# Patient Record
Sex: Male | Born: 1955 | Race: White | Hispanic: No | Smoking: Never smoker
Health system: Southern US, Community
[De-identification: ages and names within clinical notes are randomized; demographics above are authoritative.]

## PROBLEM LIST (undated history)

## (undated) DIAGNOSIS — M542 Cervicalgia: Secondary | ICD-10-CM

## (undated) DIAGNOSIS — H109 Unspecified conjunctivitis: Principal | ICD-10-CM

## (undated) DIAGNOSIS — M1990 Unspecified osteoarthritis, unspecified site: Secondary | ICD-10-CM

## (undated) DIAGNOSIS — J309 Allergic rhinitis, unspecified: Secondary | ICD-10-CM

## (undated) DIAGNOSIS — G8929 Other chronic pain: Secondary | ICD-10-CM

## (undated) DIAGNOSIS — C801 Malignant (primary) neoplasm, unspecified: Secondary | ICD-10-CM

## (undated) DIAGNOSIS — IMO0002 Reserved for concepts with insufficient information to code with codable children: Secondary | ICD-10-CM

## (undated) DIAGNOSIS — IMO0001 Reserved for inherently not codable concepts without codable children: Secondary | ICD-10-CM

## (undated) DIAGNOSIS — Z87442 Personal history of urinary calculi: Secondary | ICD-10-CM

## (undated) DIAGNOSIS — I10 Essential (primary) hypertension: Secondary | ICD-10-CM

## (undated) DIAGNOSIS — G43009 Migraine without aura, not intractable, without status migrainosus: Secondary | ICD-10-CM

## (undated) DIAGNOSIS — J029 Acute pharyngitis, unspecified: Secondary | ICD-10-CM

## (undated) DIAGNOSIS — M549 Dorsalgia, unspecified: Secondary | ICD-10-CM

## (undated) DIAGNOSIS — G629 Polyneuropathy, unspecified: Secondary | ICD-10-CM

## (undated) DIAGNOSIS — R5382 Chronic fatigue, unspecified: Secondary | ICD-10-CM

## (undated) DIAGNOSIS — G4730 Sleep apnea, unspecified: Secondary | ICD-10-CM

## (undated) DIAGNOSIS — K219 Gastro-esophageal reflux disease without esophagitis: Secondary | ICD-10-CM

## (undated) HISTORY — DX: Allergic rhinitis, unspecified: J30.9

## (undated) HISTORY — PX: COLONOSCOPY: SHX174

## (undated) HISTORY — DX: Chronic fatigue, unspecified: R53.82

## (undated) HISTORY — DX: Migraine without aura, not intractable, without status migrainosus: G43.009

## (undated) HISTORY — DX: Acute pharyngitis, unspecified: J02.9

## (undated) HISTORY — PX: BACK SURGERY: SHX140

## (undated) HISTORY — PX: LYMPH NODE BIOPSY: SHX201

## (undated) HISTORY — DX: Unspecified conjunctivitis: H10.9

## (undated) HISTORY — PX: TONSILLECTOMY: SUR1361

## (undated) HISTORY — DX: Personal history of urinary calculi: Z87.442

## (undated) HISTORY — DX: Essential (primary) hypertension: I10

---

## 1975-12-02 HISTORY — PX: OTHER SURGICAL HISTORY: SHX169

## 2000-08-21 ENCOUNTER — Encounter: Payer: Self-pay | Admitting: Emergency Medicine

## 2000-08-21 ENCOUNTER — Encounter: Admission: RE | Admit: 2000-08-21 | Discharge: 2000-08-21 | Payer: Self-pay | Admitting: Emergency Medicine

## 2002-09-06 ENCOUNTER — Encounter: Admission: RE | Admit: 2002-09-06 | Discharge: 2002-09-06 | Payer: Self-pay | Admitting: Specialist

## 2002-09-06 ENCOUNTER — Encounter: Payer: Self-pay | Admitting: Specialist

## 2002-09-30 ENCOUNTER — Encounter: Payer: Self-pay | Admitting: Specialist

## 2002-10-05 ENCOUNTER — Inpatient Hospital Stay (HOSPITAL_COMMUNITY): Admission: RE | Admit: 2002-10-05 | Discharge: 2002-10-09 | Payer: Self-pay | Admitting: Specialist

## 2002-10-05 ENCOUNTER — Encounter: Payer: Self-pay | Admitting: Specialist

## 2002-12-01 HISTORY — PX: NECK SURGERY: SHX720

## 2002-12-14 ENCOUNTER — Encounter: Payer: Self-pay | Admitting: Specialist

## 2002-12-14 ENCOUNTER — Encounter: Admission: RE | Admit: 2002-12-14 | Discharge: 2002-12-14 | Payer: Self-pay | Admitting: Specialist

## 2003-04-26 ENCOUNTER — Encounter: Payer: Self-pay | Admitting: Specialist

## 2003-04-26 ENCOUNTER — Encounter: Admission: RE | Admit: 2003-04-26 | Discharge: 2003-04-26 | Payer: Self-pay | Admitting: Specialist

## 2003-10-12 ENCOUNTER — Encounter: Admission: RE | Admit: 2003-10-12 | Discharge: 2003-10-12 | Payer: Self-pay | Admitting: Specialist

## 2003-12-04 ENCOUNTER — Inpatient Hospital Stay (HOSPITAL_COMMUNITY): Admission: RE | Admit: 2003-12-04 | Discharge: 2003-12-08 | Payer: Self-pay | Admitting: Specialist

## 2004-06-12 ENCOUNTER — Encounter: Admission: RE | Admit: 2004-06-12 | Discharge: 2004-06-12 | Payer: Self-pay | Admitting: Specialist

## 2005-10-17 ENCOUNTER — Emergency Department (HOSPITAL_COMMUNITY): Admission: EM | Admit: 2005-10-17 | Discharge: 2005-10-17 | Payer: Self-pay | Admitting: Emergency Medicine

## 2005-11-13 ENCOUNTER — Ambulatory Visit (HOSPITAL_COMMUNITY)
Admission: RE | Admit: 2005-11-13 | Discharge: 2005-11-13 | Payer: Self-pay | Admitting: Physical Medicine and Rehabilitation

## 2006-03-02 ENCOUNTER — Ambulatory Visit: Payer: Self-pay | Admitting: Anesthesiology

## 2006-03-02 ENCOUNTER — Encounter: Admission: RE | Admit: 2006-03-02 | Discharge: 2006-05-31 | Payer: Self-pay | Admitting: Anesthesiology

## 2006-05-13 ENCOUNTER — Encounter: Admission: RE | Admit: 2006-05-13 | Discharge: 2006-05-13 | Payer: Self-pay | Admitting: Specialist

## 2006-07-07 ENCOUNTER — Observation Stay (HOSPITAL_COMMUNITY): Admission: RE | Admit: 2006-07-07 | Discharge: 2006-07-08 | Payer: Self-pay | Admitting: Specialist

## 2006-10-27 ENCOUNTER — Encounter: Admission: RE | Admit: 2006-10-27 | Discharge: 2006-10-27 | Payer: Self-pay | Admitting: Specialist

## 2006-11-17 ENCOUNTER — Encounter: Admission: RE | Admit: 2006-11-17 | Discharge: 2006-11-17 | Payer: Self-pay | Admitting: Specialist

## 2007-08-11 ENCOUNTER — Emergency Department (HOSPITAL_COMMUNITY): Admission: EM | Admit: 2007-08-11 | Discharge: 2007-08-11 | Payer: Self-pay | Admitting: Emergency Medicine

## 2010-12-22 ENCOUNTER — Encounter: Payer: Self-pay | Admitting: Emergency Medicine
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
## 2011-04-18 NOTE — Procedures (Signed)
NAMETAYO, MAUTE NO.:  [id number]   MEDICAL RECORD NO.:  [id number]          PATIENT TYPE:  REC   LOCATION:  [facility]                          FACILITY:  [facility]   PHYSICIAN:  Celene Kras, MD        DATE OF BIRTH:  Nov 27, 1956   DATE OF PROCEDURE:  03/24/2006  DATE OF DISCHARGE:                                 OPERATIVE REPORT   Shane Cantu comes in for pain management today.  Evaluated him __________  history form 14 point review of systems demonstrated a positive provocative  experience to the cervical facet medial branch injection. We will go ahead  and proceed on with RF.  I have reviewed the risks, complications and  options including bleeding, infection, nerve damage, stroke, seizure, death  and other unforeseen problems not commonly occurring, and idiosyncratic  reactions to medications.  We planned left side, C4, 5, 6 and 7 with  contributory enervation addressed, under local anesthetic with a 5 mm active  tip.  I have reviewed this procedure extensively with him using models,  descriptive indices, no barrier to communication.   1.  Will probably have to reinforce at a later date, but will follow him      expectantly and will have him return to Dr. Ethelene Hal in about a month.      Realistically, I relayed to him that this is about a 25 to 50%      diminution in pain perception but more importantly improved range of      motion, less myofascial pain, less reliance on medication.  He      understands this is not a panacea, but a movement forward to enhance      wellness and recovery.   Objectively, diffuse paracervical and myofascial discomfort, positive  cervical facetal compression test, left greater than right, suboccipital  compression test positive.  He has no new neurological findings nor sensory  or reflex.   IMPRESSION:  Cervical facet syndromesof the cervical spine.   PLAN:  Cervical facet medial branch radiofrequency neuroablation to the left  side,  C4, 5, 6, and 7 contributory enervation addressed under local  anesthetic.  Independent needle access points.  He has consented.   The patient was taken to the fluoroscopy suite and placed in supine position  after prepped and draped in usual fashion.  Using 22 gauge RF needle, I  advanced under direct fluoroscopic observation facet, medial branch at C4,  5, 6 and 7 and confirmed placement of multiple fluoroscopic positions.  No  CSF, heme or paresthesia.  Test block uneventfully.  I followed with motor  sensory stim, and inject 0.5 mL lidocaine 1% NPF each level, total of 30 mg  __________ in divided dose.   Lesion performed 60 degrees 60 seconds.   The patient tolerated the procedure well.  No complication for procedure.  Appropriate recovery.  Discharge instructions given.  I will assess him in  context of activities of daily living.  Return to Dr. Ethelene Hal.           ______________________________  Celene Kras, MD     HH/MEDQ  D:  03/24/2006 11:24:55  T:  03/25/2006 08:05:29  Job:  [id number]   cc:   Caralyn Guile. Ethelene Hal, M.D.  Fax: [phone]

## 2011-04-18 NOTE — Discharge Summary (Signed)
NAMEELVAN, EBRON NO.:  [id number]   MEDICAL RECORD NO.:  [id number]                   PATIENT TYPE:  INP   LOCATION:  5008                                 FACILITY:  [facility]   PHYSICIAN:  Kerrin Champagne, M.D.                DATE OF BIRTH:  Oct 10, 1956   DATE OF ADMISSION:  12/04/2003  DATE OF DISCHARGE:  12/08/2003                                 DISCHARGE SUMMARY   ADMISSION DIAGNOSES:  1. 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  2. Sleep apnea.  3. Hiatal hernia with reflux symptoms.  4. Status post central laminectomy and fusion with TLIF L4-05 October 2002.   DISCHARGE DIAGNOSES:  1. 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 Hardware loosening on the right side at     the L4 level. L5 pedicle screw channel checked for tissue resistance, and     this read low so that fixation on the right was not replaced.  2. 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  3. Sleep apnea.  4. Hiatal hernia with reflux symptoms.  5. Status post central laminectomy and fusion with TLIF L4-05 October 2002.  6. Post anemia, mild.  7. Tape blisters of the left buttock with no signs of infection at     discharge.   PROCEDURE:  On December 04, 2003, the patient underwent removal of hardware L4-  5 pedicle screws and rods. Decompression of bilateral L4 and bilateral L5  nerve roots with lateral recess decompression bilaterally at the L3-4 level  and bilateral foraminotomy at the L4-5 level. Bilateral L5 foraminotomy.  Right sided approach for debridement of the L4-5 disk space and redo TLIF  via the right side L4-5 using BMP interfused bone graft and local bone graft  material. Right intertransverse process fusion  using local bone graft and  interfused BMP. Left sided intertransverse process fusion with BMP alone.  This was performed by Dr. Otelia Sergeant assisted by Wende Neighbors, P.A.-C, under  general anesthesia.   CONSULTATIONS:  None.   BRIEF HISTORY:  The patient is a 55 year old male with history of previous  L5-S1 fusion for degenerative disk disease. The patient returned to the  operating room October 12, 2004 and underwent an extension of the fusion to  the L4-5 level for severe lumbar degenerative disk disease. The procedure  included a TLIF __________ left side at the L4-5 level with posterior  instrumentation and posterolateral fusion. Local bone graft was used as well  as allograft bone graft. The patient had persistent pain postoperatively  with discomfort in his back and severe  pain into the lower extremities, left  greater than right. He underwent conservative treatment and extensive  evaluation. After a six-month postoperative course, the fusion did not  demonstrate healing, and indeed, there did appear to be primarily lucency  about the bone graft at the TLIF level on radiographs. As the patient's pain  was persistent, he underwent further evaluation including a neurosurgeon  evaluation as well as evaluation by Dr. Thereasa Solo at [facility] who did indicate that the re-arthrodesis and redo instrumentation  was the appropriate [place] to treat the nonunion. After a lengthy discussion  with all of the surgeons including the consulting surgeons and Dr. Otelia Sergeant,  the patient was in agreement with return to the operating room for the above  stated procedure.   BRIEF HOSPITAL COURSE:  The patient tolerated the procedure under general  anesthesia without complications. On the first postoperative day,  neurovascular motor function was noted to be intact. The patient was treated  with PCA analgesics and slowly weaned to p.o. analgesics. He did have mild  abdominal distention;  however, bowel sounds were present, and he was having  flatus. He was slow to progress to bowel movement, and therefore, his diet  was advanced very slowly. As he was eventually able to advance his diet, he  was started on oral analgesics and tolerated these well. He primarily had  significant difficulty with muscle spasms and was treated with the  appropriate relaxers. Hemovac drain was discontinued on the first  postoperative day, and wound checks were done daily thereafter. The patient  was noted to have skin tear of the left buttock secondary to tape and a  taper blister also of the left lower back. These were treated locally with  Betadine and ointment. They did show evidence of granulation tissue with no  signs or symptoms of infection. The patient received physical therapy for  ambulation and gait training. He did require use of a back brace in the form  of an Aspen LSO while out of bed. He was allowed doning and doffing of the  brace at bedside. Physical therapy assisted him with ambulation and gait  training. He utilized a walker for this and at discharge was independent  with ambulation to 300 feet, eventually with no device. On December 08, 2003,  the patient was comfortable with oral medications. He was afebrile and vital  signs were stable. He was voiding well and having adequate bowel movements.  The patient was felt to be stable for discharge to his home.   PERTINENT LABORATORY DATA:  EKG on admission showed normal sinus rhythm,  right bundle branch block, left ventricular hypertrophy with QRS widening  with no significant change since last tracing on October 2003 confirmed by  Dr. Algie Coffer. CBC on admission:  Within normal limits showing hemoglobin and  hematocrit 16.5 and 48.4. Hemoglobin dropped to the lowest value of 11.0 and  32.5. Value at discharge was 11.2 and 33.4. Coagulation studies on admission were within normal limits. Urinalysis on admission was normal as well.   Chemistry studies on admission were within normal limits with exception of  glucose 106. Repeat chemistries on December 05, 2003 showed values normal with  exception of glucose 181, calcium 8.1, total protein 5.4, albumin 3.2, and  total bilirubin 1.3. No chest x-ray is on the chart at the time of this  dictation.   PLAN:  The patient is discharged to his home. He will continue ambulation  with his brace at all  times. He will not be required to wear the brace while  in bed. He will avoid lifting over 5 pounds. No bending, twisting, or  squatting. Genevieve Norlander will provide him with home health physical therapy. The  patient will change his dressing daily. He will monitor the tape blisters  for signs of infection and use antibiotic ointment on these. The patient  will continue on a regular diet.   Medications at discharge include:  1. OxyContin 20 mg p.o. b.i.d.  2. OxyIR one to two every four to six hours as needed for breakthrough pain.  3. Valium 5 mg one every eight hours as needed for spasm.   He will avoid aspirin and anti-inflammatory medications. The patient has  been advised to follow up with Dr. Otelia Sergeant on the following Monday and will be  seen at that appointment for a wound check. All questions were encouraged  and answered, and he has been advised to call if there are questions prior  to his return office visit.      Wende Neighbors, P.A.                    Kerrin Champagne, M.D.    SMV/MEDQ  D:  02/06/2004  T:  02/07/2004  Job:  [id number]
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
## 2011-04-18 NOTE — Op Note (Signed)
NAME:  Shane Cantu, Shane Cantu                             ACCOUNT NO.:  [id number]   MEDICAL RECORD NO.:  [id number]                   PATIENT TYPE:  INP   LOCATION:  2550                                 FACILITY:  [facility]   PHYSICIAN:  Kerrin Champagne, M.D.                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   POSTOPERATIVE DIAGNOSIS:  The patient had a well-defined nonunion at the L4-  5 level with gross motion evident following removal of hardware.  Hardware  was loose on the right side at the L4 level.  The L5 pedicle screw channel  was checked for tissue resistance and this read low so that fixation on the  right side was not replaced.   PROCEDURE:  Removal of hardware L4-5 pedicle screws and rods.  Decompression  of bilateral L4 and bilateral L5 nerve roots with lateral recess  decompression bilateral at the L3-4 level and bilateral foraminotomy at the  L4-5 level.  Bilateral L5 foraminotomy.  Right-sided approach for  debridement of the L4-5 disk space and redo TLIF via the right side L4-5  using BMP interfuse bone graft and local bone graft material.  Right  intertransverse process fusion using local bone graft and interfuse BMP.  Left-sided intertransverse process fusion with BMP alone.   SURGEON:  Kerrin Champagne, M.D.   ASSISTANT:  Wende Neighbors, P.A.   ANESTHESIA:  GOT by Burna Forts, M.D.   ESTIMATED BLOOD LOSS:  1300 mL.   DRAINS:  Hemovac x1.  Foley to straight drain.   BLOOD REPLACED:  3 units of packed cells, 600 mL via Cellsaver.  Hemoglobin  at the end of the case was 11.5 and 33%.   INDICATIONS FOR PROCEDURE:  The patient is a 55 year old male who has had a  history of previous L5-S1 fusion for  degenerative disk disease.  The patient  returned to the operating room on October 12, 2002, and underwent an  extension of fusion to the L4-5 level for severe lumbar degenerative disk  disease.  The patient had TLIF procedure via the left side at L4-5 with  posterior instrumentation and posterolateral fusion.  Local bone graft was  used at the time of the procedure and an allograft bone graft measuring 14  mm was used in the disk space.  The patient had blood loss of 450 mL  initially, returned blood 200 mL.  The patient postoperatively persisted  with pain and discomfort in his back and severe pain into his lower  extremities, left side greater than right.  He underwent continued  evaluation.  After a period of six  months, the fusion did not demonstrate  healing and indeed there appeared to be primarily lucency about the bone  graft at the TLIF level.  The patient continued to persist with back pain  and radiation to both legs.  Workup showed evidence of graft nonunion at the  TLIF side at the L4-5 level.   The patient underwent initial evaluation by neurosurgeons who felt that the  patient was not a very good candidate for further surgeries.  An evaluation  by Dr. Thereasa Solo at [facility] indicated that a  rearthrodesis with redo instrumentation was felt to be the appropriate [facility]  to deal with the nonunion.  The patient is brought to the operating room to  undergo a redo TLIF using bone morphologic protein via Interfuse to the  intervertebral disk space and redo instrumentation.   FINDINGS:  The patient was found to have loosening of hardware on the right  side at the L4 level.  The L5 screw on the right side after removal of the  screw, testing of the screw channel, demonstrated decreased soft tissue  resistance.  Trial of the right-sided hardware could not be replaced.  The  left-sided hardware, however, provided excellent fixation and  reinstrumenting  on this side provided excellent fixation alone.  From the  right side, the right posterolateral aspect of the disk was able to be  evaluated and easily entered.  The debris within the disk space along the  right posterior aspect of the previous TLIF graft was able to be debrided  back to bleeding cancellous bone surfaces.  Bone morphologic protein in  addition to local bone graft was then able to be used to bone graft the  interspace here.  Additional bone graft was then placed over the right  posterolateral region in addition to Interfuse BMP material.  On the left  side, BMP alone was applied.   DESCRIPTION OF PROCEDURE:  After adequate general anesthesia with the  patient in prone position with chest rolls and all pressure points well-  padded and thigh-high TED hose, standard preoperative antibiotics, and  standard prep.  The patient had Foley catheter placed prior to turning to a  prone position.  All pressure points were well-padded.  The patient  underwent prep with Duraprep solution from the lower dorsal level to the S2  level.  Draped in the usual sterile fashion.  Vi-drapes were used.   The incision ellipsing the old incision scar at the expected L4-5 level.  Through the skin and subcutaneous layers.  Bleeders were controlled using  electrocautery.  Lumbodorsal fascia incised in the midline extending from  the spinous process, the residual portion of the spinous process of L3 down  to L5.  Cobb was then used to elevate the paralumbar muscles off the  posterior aspect of the elements at the L3 level to identify soft tissue  plane as well as the depth of the opening posteriorly.  Incision carried  down then to a level that was felt to be safe and then two Cobb's used to  elevate the soft tissue muscles bilaterally out to the areas of the pedicle  screws and rods on both sides and also exposing the posterior aspect of the lamina of L5 inferiorly.  Self-retaining retractors were  placed.  McCullough  retractor and Boss retractor was used.  The previous laminectomy defect was  then open by removing a small portion of the inferior aspect of the spinous  process of L3.  Then introducing a 3 mm Kerrison beneath the inferior aspect  of the lamina of L3 performing a laminectomy, semi involving the inferior  aspect of this up to the insertion of the ligamentum flavum.  Bilateral  lateral recesses were then decompressed.  Ligamentum flavum was found to be  quite hypertrophic and the lateral recesses did appear to be compressing  against the thecal sac bilaterally at the L3 level which could effect  bilateral L4 nerve roots.  The lateral recess decompression was carried out  with partial medial facetectomy on both sides of about 10%, then resecting  the overhang and the reflected portion of the ligamentum flavum impinging  upon the L4 nerve roots at their initial entry into the neuroforamen at the  L4-5 level.  Both L4 nerve roots were completely well decompressed, the  ligamentum flavum excised overlying these areas and any residual partial  area was completely resected bilaterally.  A great deal of scar tissue was  found on the left side in addition to ligamentum flavum from the L3-4 level  compressing on the left L3 nerve root.  This was resected.  Scar tissue,  however, remained over the posterior aspect of the thecal sac on the left  side at the L4-5 level and could not be removed.  It was densely adherent to  the thecal sac and overlying the L4 nerve root.  The hockey stick nerve  probe could easily pass down the left L4 and right L4 neuroforamen though  following this decompression.  And over the superior aspect of the L5  lamina, the lamina was first thinned using a high speed bur and then a  Kerrison underneath the superior aspect of the L5 lamina used to remove a  small portion of the superior aspect of this lamina decompressing the L5  neuroforamen on both sides.   Then continuing up the neuroforamen superiorly  to the pedicle level and decompressing the medial aspect of the pedicle on  both sides as well as the facet bilaterally at the L4-5 level decompressing  the lateral aspects of the thecal sac bilaterally at the L4-5 level.  The  superior articular process of L5 on the right side was completely resected  in order to decompress the right side neuroforamen as well as to expose the  posterolateral aspect of the disk on the right side for TLIF procedure.  Each of the screw heads posteriorly were then completely exposed.  The screw  fastener were resected or removed at each level of all four.  Screwdriver  then used to remove the screws.  As they were removed, each of the screws  were measured for their length and their size.  All four screws measured  6.25 in their diameter.  The left side L5 pedicle screw was approximately 35  mm in length.  The left upper 45 mm in length.  The right lower 35 mm in length and the right upper was not measured.   Following removal of the screws on the left side, the right-sided screws  were removed.  The left-sided screws were replaced using 7.0 Monarch pedicle  screws, a 40 mm screw on the left side at the L5 level obtaining excellent  purchase, and on the left side at the L4 level using the 45 mm length with a  7.0 screw.  This provided excellent capture of the bone of the pedicles on  the left side at L4 and L5.  Soft tissue resistance was tested through each  of the screws measuring 26 mm or greater.  On the right side, however, with  removal of screws, it was evident that the first L4 screw was loose and it  required some pull in order to remove the screw from the pedicle while it  was being unscrewed.  At the L5 level, the probe was placed within the  pedicle following removal of the screw and soft tissue resistance tested and  it demonstrated a score of 6 or 7.  It was felt that the pedicle was not in  good  enough condition to allow for further fixation here.  With this then  attention was turned to the right posterolateral aspect of the disk and the  L4 nerve root completely decompressed.  Retracted medially as well as the  thecal sac on the right side laterally freed up off of the posterior aspect  of the disk.  Bipolar electrocautery used to control epidural bleeders.  Thecal sac retracted medially.  A 15 blade scalpel then used to incise the  disk on the right side, excising a window of disk material.  Pituitary  rongeurs then used to reexcise disk material on the right side over the  posterior aspect of the previous allograft TLIF graft material.  Then along  the right side.  This continued anterior to the graft as well, along the  right side where a window of about 1 cm to 1.5 cm was present.  The end  plates were then decorticated and large amounts of cartilaginous material  were able to be removed as well as disk from the left side here.  Once this  was completely excised as best as possible, which required 45 minutes to an  hour of preparation using ring curets as well as regular curets, angling  slightly anterior to the graft, allowing for the decompression of this level  and protecting against entry past the anterior annulus of the disk space.  Bleeding cancellous bone was evident following the debridement.  The  allograft TLIF graft material was left in place.  Careful hemostasis was  obtained in the surrounding areas of tissues, bone wax applied to the  bleeding cancellous bone surfaces.  Small bleeders were controlled using  monopolar electrocautery in the soft tissue areas and then near the nerve  roots, bipolar electrocautery.  Thrombin-soaked Gelfoam was placed and then  excess Gelfoam removed from all areas.  BMP using a large portion of  material was then wrapped and then placed within the intervertebral disk  space following placement of a few chips of local bone graft  material.  An additional layer of bone graft material was then placed and then further BMP  material and then an additional layer of local bone graft and this was  impacted into place using a curet.  Once this was completed, then care was  taken to inspect the spinal canal and demonstrated no bone fragments loose  within the canal of the L5 and L4 nerve roots appeared to exit without  compression.  This was on the right side and left side.  Additional bone  graft was placed over the right transverse process region using local bone  graft material in addition to BMP and over the left side lateral to the  hardware.  The upper screw on the left side was then tightened and torqued  to 100 foot pounds.  Compression obtained across the screws on the left  using compressor device.  Then the lower screw tightened to  100 foot pounds.  Fasteners then tightened to the rod.  Fusion was complete.  Irrigation had  already been performed prior to the institution of BMP.  Medium Hemovac  drain was placed in the depth of the incision.  Soft tissues approximated  over the laminotomy site with interrupted #1 Vicryl sutures.  Lumbodorsal  fascia reapproximated in the midline with interrupted #1 Vicryl sutures.  Deep subcu layers approximately with interrupted #1 0 Vicryl suture, the  more superficial layers with interrupted 2-0 Vicryl sutures, and skin closed  with running stitch of 4-0 Vicryl.  In addition to the 4-0 Vicryl, stainless  steel staples were placed to the patient's skin tension.  I felt this would  provide better approximation of the skin, but should be removed at one-week  postoperatively.  The patient then had application of dressing, 4x4, ABD  pad, affixed to the skin with Hypafix tape.  He was then returned to the  supine position, reactivated, extubated, and returned to the recovery room  in satisfactory condition.  All needle, sponge, and instrument counts  correct.                                                Kerrin Champagne, M.D.   Myra Rude  D:  12/04/2003  T:  12/04/2003  Job:  [id number]

## 2011-04-18 NOTE — Discharge Summary (Signed)
NAMEJAIVION, Shane Cantu NO.:  [id number]   MEDICAL RECORD NO.:  [id number]                   PATIENT TYPE:  INP   LOCATION:  5025                                 FACILITY:  [facility]   PHYSICIAN:  Kerrin Champagne, M.D.                DATE OF BIRTH:  May 15, 1956   DATE OF ADMISSION:  10/05/2002  DATE OF DISCHARGE:  10/09/2002                                 DISCHARGE SUMMARY   ADMISSION DIAGNOSES:  1. Severe spinal stenosis, L4-5, secondary to degenerative spondylolisthesis     above an L5-S1 fusion.  2. Gastroesophageal reflux disease.  3. Sleep apnea.  4. Status post posterolateral fusion, L5-S1.   DISCHARGE DIAGNOSES:  1. Severe spinal stenosis, L4-5, secondary to degenerative spondylolisthesis     above an L5-S1 fusion.  2. Gastroesophageal reflux disease.  3. Sleep apnea.  4. Status post posterolateral fusion, L5-S1.  5. Mild postoperative anemia.   PROCEDURE:  On October 06, 2002, the patient underwent removal of  translaminar facet grooves at the L5-S1 level, then central laminectomy at  L4-5 with bilateral L4 and L5 nerve root decompressions; left transpedicular  lumbar interbody fusion utilizing a 14-mm Nuvasive biportal allograft bone  graft, height 14 mm, with 11 mm in length; additional bone graft to the  interbody space using local bone graft, then posterolateral fusion, L4-5,  utilizing local bone graft and instrumentation at the L4-5 level posteriorly  using pedicle screws and rods of Monarch type; this was performed by Dr.  Kerrin Champagne, assisted by Dr. Jene Every and Wende Neighbors, P.A.C.  under general anesthesia.   CONSULTATIONS:  None.   BRIEF HISTORY:  The patient is a 55 year old male, status post lumbar fusion  using translaminar facet grooves, posterolateral, and posterior fusion at  the L5-S1 level for problems of degenerative disk disease and severe lumbar  pain.  Over the past year, he has had developed progressive  worsening of  pain with standing and ambulation, with radiation of pain into both lower  extremities, left greater than right.  Initial evaluation included  radiographs demonstrating spondylolisthesis at L4-5 with degenerative  changes of solid fusion at L5-S1, with retained translaminar facet grooves  at L5-S1.  Post-myelogram CT demonstrated severe lumbar spinal fusion  associated with a grade 1 degenerative spondylolisthesis at L4-5 for  degenerative disk changes at this segment.  The patient was felt to require  surgical intervention and was admitted for the procedure as stated above.   BRIEF HOSPITAL COURSE:  The patient tolerated the procedure without  complications under general anesthesia.  Postoperatively, he had slight  distention of his abdomen with positive bowel sounds and nontender.  He was  continued on ileus precautions with clear liquids only, IV Reglan and  Dulcolax p.o.  He was fitted with a brace on the first postoperative day and  began his physical therapy  with ambulation and gait training utilizing a  walker.  The patient utilized PCA analgesics initially for his discomfort  and was weaned to p.o. analgesics without difficulty.  On the second  postoperative day, his Foley catheter was discontinued.  His diet was  advanced to a soft diet.  Postoperative hemoglobin dropped to 13.4 with  hematocrit 39.6.  He did not require blood transfusion during the hospital  stay.  Hemovac drain was discontinued on the first postoperative day and  dressing changes were done daily thereafter.  On the second postoperative  day, he was noted to have serosanguinous drainage on his dressing.  Over the  next 48 hours, this improved with much less drainage and no signs or  symptoms of infection.  No erythema or edema were noted about the wound.  Neurovascular and motor function in the lower extremities remained intact.  The patient was ambulating quite well utilizing his brace.  On  October 09, 2002, he was felt to be stable for discharge home.  The patient had had a  bowel movement, was voiding well and taking a regular diet.  His pain was  well-controlled with p.o. analgesics.  He was afebrile and vital signs were  stable on the day of discharge.   PERTINENT LABORATORY VALUES:  Admission EKG with normal sinus rhythm, right  bundle branch block, with no previous tracings for comparison, confirmed by  Dr. Jaclyn Prime. [place].   CBC on admission with hemoglobin 14.8, hematocrit 43.8.  Postoperative  hemoglobin dropped to the lowest value of 11.7 with hematocrit 33.4.  Coagulation screen on admission was normal.  Routine chemistry studies on  admission were within normal limits.  Repeat on October 06, 2002 showed  glucose of 175, calcium 8.2, total protein 5.7, albumin 3.3, AST 39, total  bilirubin 1.6, remaining values within normal limits.  Urinalysis on  admission was negative for urinary tract infection.   PLAN:  The patient was discharged to his home.  Arrangements will be made  for him to follow up in 10 to 14 days with Dr. Otelia Sergeant.  He will change his  dressing daily and will not shower until the drainage has completely  subsided.  He will continue activity as tolerated, ambulating with his brace  on.  He must wear his brace at all times, except when sleeping.  He is to  avoid lifting, bending, squatting or sitting for long periods of time.   DISCHARGE MEDICATIONS:  Prescriptions were given for:  1. Percocet 5/325 mg -- #50 -- one to two every four to six hours as needed     for pain.  2. Robaxin 500 mg -- #40 -- one every eight hours as needed for spasm.  3. Colace 100 mg -- #60 -- one p.o. b.i.d.   He will utilize medications as taken prior to the admission with the  exception of any anti-inflammatory medications.  He will use over-the-  counter laxative or enema as needed for constipation and has been advised to watch this closely while on narcotic  analgesics.   DIET:  He will continue with a regular diet.   SPECIAL DISCHARGE INSTRUCTIONS:  The patient will call the office if he has  questions or concerns prior to his return office visit.     Wende Neighbors, P.A.                    Kerrin Champagne, M.D.    SMV/MEDQ  D:  11/22/2002  T:  11/24/2002  Job:  [id number]

## 2013-04-06 ENCOUNTER — Encounter (HOSPITAL_BASED_OUTPATIENT_CLINIC_OR_DEPARTMENT_OTHER): Payer: Self-pay | Admitting: *Deleted

## 2013-04-06 ENCOUNTER — Emergency Department (HOSPITAL_BASED_OUTPATIENT_CLINIC_OR_DEPARTMENT_OTHER)
Admission: EM | Admit: 2013-04-06 | Discharge: 2013-04-06 | Disposition: A | Discharge status: Home / Self Care | Payer: Medicare Other | Attending: Emergency Medicine | Admitting: Emergency Medicine

## 2013-04-06 ENCOUNTER — Emergency Department (HOSPITAL_BASED_OUTPATIENT_CLINIC_OR_DEPARTMENT_OTHER): Payer: Medicare Other

## 2013-04-06 DIAGNOSIS — G8929 Other chronic pain: Secondary | ICD-10-CM | POA: Insufficient documentation

## 2013-04-06 DIAGNOSIS — K5900 Constipation, unspecified: Secondary | ICD-10-CM | POA: Insufficient documentation

## 2013-04-06 HISTORY — DX: Other chronic pain: G89.29

## 2013-04-06 LAB — CBC WITH DIFFERENTIAL/PLATELET
Basophils Relative: 1 % (ref 0–1)
Eosinophils Absolute: 0 10*3/uL (ref 0.0–0.7)
HCT: 45.2 % (ref 39.0–52.0)
Hemoglobin: 15.8 g/dL (ref 13.0–17.0)
Lymphs Abs: 1.3 10*3/uL (ref 0.7–4.0)
MCH: 29.9 pg (ref 26.0–34.0)
MCHC: 35 g/dL (ref 30.0–36.0)
MCV: 85.6 fL (ref 78.0–100.0)
Monocytes Absolute: 0.9 10*3/uL (ref 0.1–1.0)
Monocytes Relative: 10 % (ref 3–12)

## 2013-04-06 LAB — COMPREHENSIVE METABOLIC PANEL
Albumin: 4.4 g/dL (ref 3.5–5.2)
BUN: 11 mg/dL (ref 6–23)
Creatinine, Ser: 1.1 mg/dL (ref 0.50–1.35)
GFR calc Af Amer: 83 mL/min — ABNORMAL LOW (ref >90)
Glucose, Bld: 119 mg/dL — ABNORMAL HIGH (ref 70–99)
Total Bilirubin: 0.9 mg/dL (ref 0.3–1.2)
Total Protein: 7.5 g/dL (ref 6.0–8.3)

## 2013-04-06 MED ORDER — POLYETHYLENE GLYCOL 3350 17 GM/SCOOP PO POWD: 17.0000 g | Freq: Two times a day (BID) | ORAL | Status: AC

## 2013-04-06 NOTE — ED Notes (Signed)
Pt c/o constipation x 1 week  Last BM x 1 week , no relief Enema or mag citrate

## 2013-04-06 NOTE — ED Provider Notes (Signed)
History     CSN: [id number]  Arrival date & time 04/06/13  1713   First MD Initiated Contact with Patient 04/06/13 1723      Chief Complaint  Patient presents with  . Constipation    (Consider location/radiation/quality/duration/timing/severity/associated sxs/prior treatment) HPI Comments: Patient presents with complaints of not having a bowel movement in the past week.  He has tried mag citrate and an enema with no relief.  No fevers or chills.  No urinary complaints.  He denies having ever had a colonoscopy.  No prior abd surgeries.  Patient is a 57 y.o. male presenting with constipation. The history is provided by the patient.  Constipation  The current episode started more than 1 week ago. The onset was sudden. The problem occurs continuously. The problem has been gradually worsening. The pain is moderate. There was no prior successful therapy. Prior unsuccessful therapies include laxatives and enemas. Pertinent negatives include no fever, no nausea and no vomiting.    Past Medical History  Diagnosis Date  . Chronic pain     Past Surgical History  Procedure Laterality Date  . Back surgery    . Tonsillectomy      History reviewed. No pertinent family history.  History  Substance Use Topics  . Smoking status: Never Smoker   . Smokeless tobacco: Not on file  . Alcohol Use: No      Review of Systems  Constitutional: Negative for fever.  Gastrointestinal: Positive for constipation. Negative for nausea and vomiting.  All other systems reviewed and are negative.    Allergies  Morphine and related  Home Medications   Current Outpatient Rx  Name  Route  Sig  Dispense  Refill  . cyclobenzaprine (FLEXERIL) 10 MG tablet   Oral   Take 10 mg by mouth 3 (three) times daily as needed for muscle spasms.         . traMADol (ULTRAM) 50 MG tablet   Oral   Take 50 mg by mouth every 6 (six) hours as needed for pain.           BP 168/100  Pulse 82  Temp(Src) 98.2  F (36.8 C) (Oral)  Resp 16  Ht 5\' 9"  (1.753 m)  Wt 185 lb (83.915 kg)  BMI 27.31 kg/m2  SpO2 100%  Physical Exam  Nursing note and vitals reviewed. Constitutional: He is oriented to person, place, and time. He appears well-developed and well-nourished. No distress.  HENT:  Head: Normocephalic and atraumatic.  Mouth/Throat: Oropharynx is clear and moist.  Neck: Normal range of motion. Neck supple.  Cardiovascular: Normal rate and regular rhythm.   No murmur heard. Pulmonary/Chest: Effort normal and breath sounds normal. No respiratory distress. He has no wheezes.  Abdominal: Soft. Bowel sounds are normal.  Genitourinary:  There is no stool in the rectal vault.  No masses, hemorrhoids, or other abnormalities.  Musculoskeletal: Normal range of motion. He exhibits no edema.  Neurological: He is alert and oriented to person, place, and time.  Skin: Skin is warm and dry. He is not diaphoretic.    ED Course  Procedures (including critical care time)  Labs Reviewed  CBC WITH DIFFERENTIAL  COMPREHENSIVE METABOLIC PANEL   No results found.   No diagnosis found.    MDM  Patient presents with constipation, no BM for a week.  The abdomen is benign and the labs are reassuring.  The abd series shows only stool, but no sbo.  Will discharge with miralax, return prn.  Geoffery Lyons, MD 04/06/13 1946

## 2014-03-14 ENCOUNTER — Ambulatory Visit: Payer: Medicare Other | Admitting: Neurology

## 2014-03-14 ENCOUNTER — Encounter: Payer: Self-pay | Admitting: Neurology

## 2014-11-20 ENCOUNTER — Emergency Department (HOSPITAL_BASED_OUTPATIENT_CLINIC_OR_DEPARTMENT_OTHER)
Admission: EM | Admit: 2014-11-20 | Discharge: 2014-11-20 | Disposition: A | Discharge status: Home / Self Care | Payer: Medicare Other | Attending: Emergency Medicine | Admitting: Emergency Medicine

## 2014-11-20 ENCOUNTER — Emergency Department (HOSPITAL_BASED_OUTPATIENT_CLINIC_OR_DEPARTMENT_OTHER): Payer: Medicare Other

## 2014-11-20 ENCOUNTER — Encounter (HOSPITAL_BASED_OUTPATIENT_CLINIC_OR_DEPARTMENT_OTHER): Payer: Self-pay

## 2014-11-20 DIAGNOSIS — G8929 Other chronic pain: Secondary | ICD-10-CM | POA: Insufficient documentation

## 2014-11-20 DIAGNOSIS — Z79899 Other long term (current) drug therapy: Secondary | ICD-10-CM | POA: Insufficient documentation

## 2014-11-20 DIAGNOSIS — R079 Chest pain, unspecified: Secondary | ICD-10-CM | POA: Insufficient documentation

## 2014-11-20 DIAGNOSIS — R0602 Shortness of breath: Secondary | ICD-10-CM | POA: Diagnosis not present

## 2014-11-20 HISTORY — DX: Other chronic pain: G89.29

## 2014-11-20 HISTORY — DX: Cervicalgia: M54.2

## 2014-11-20 HISTORY — DX: Dorsalgia, unspecified: M54.9

## 2014-11-20 LAB — TROPONIN I: Troponin I: <0.3 ng/mL (ref <0.30)

## 2014-11-20 LAB — COMPREHENSIVE METABOLIC PANEL
ALBUMIN: 4.1 g/dL (ref 3.5–5.2)
ALT: 22 U/L (ref 0–53)
AST: 22 U/L (ref 0–37)
Alkaline Phosphatase: 117 U/L (ref 39–117)
Anion gap: 14 (ref 5–15)
BUN: 22 mg/dL (ref 6–23)
CALCIUM: 9.6 mg/dL (ref 8.4–10.5)
CO2: 24 meq/L (ref 19–32)
Chloride: 101 mEq/L (ref 96–112)
Creatinine, Ser: 1.1 mg/dL (ref 0.50–1.35)
GFR calc Af Amer: 82 mL/min — ABNORMAL LOW (ref >90)
GFR calc non Af Amer: 71 mL/min — ABNORMAL LOW (ref >90)
Glucose, Bld: 145 mg/dL — ABNORMAL HIGH (ref 70–99)
Potassium: 4.6 mEq/L (ref 3.7–5.3)
SODIUM: 139 meq/L (ref 137–147)
Total Bilirubin: 0.4 mg/dL (ref 0.3–1.2)
Total Protein: 7.9 g/dL (ref 6.0–8.3)

## 2014-11-20 LAB — CBC WITH DIFFERENTIAL/PLATELET
BASOS ABS: 0.1 10*3/uL (ref 0.0–0.1)
BASOS PCT: 1 % (ref 0–1)
Eosinophils Absolute: 0.2 10*3/uL (ref 0.0–0.7)
Eosinophils Relative: 2 % (ref 0–5)
HCT: 46.6 % (ref 39.0–52.0)
HEMOGLOBIN: 15.3 g/dL (ref 13.0–17.0)
LYMPHS ABS: 1 10*3/uL (ref 0.7–4.0)
Lymphocytes Relative: 14 % (ref 12–46)
MCH: 28.4 pg (ref 26.0–34.0)
MCHC: 32.8 g/dL (ref 30.0–36.0)
MCV: 86.5 fL (ref 78.0–100.0)
Monocytes Absolute: 0.8 10*3/uL (ref 0.1–1.0)
Monocytes Relative: 11 % (ref 3–12)
NEUTROS PCT: 72 % (ref 43–77)
Neutro Abs: 5.1 10*3/uL (ref 1.7–7.7)
Platelets: 190 10*3/uL (ref 150–400)
RBC: 5.39 MIL/uL (ref 4.22–5.81)
RDW: 13.8 % (ref 11.5–15.5)
WBC: 7.2 10*3/uL (ref 4.0–10.5)

## 2014-11-20 MED ORDER — ASPIRIN 325 MG PO TABS
325.0000 mg | ORAL_TABLET | Freq: Once | ORAL | Status: AC
  Administered 2014-11-20: 325 mg via ORAL
  Filled 2014-11-20: qty 1

## 2014-11-20 MED ORDER — IBUPROFEN 800 MG PO TABS
800.0000 mg | ORAL_TABLET | Freq: Once | ORAL | Status: AC
  Administered 2014-11-20: 800 mg via ORAL
  Filled 2014-11-20: qty 1

## 2014-11-20 NOTE — ED Provider Notes (Signed)
CSN: [id number]     Arrival date & time 11/20/14  1046 History   First MD Initiated Contact with Patient 11/20/14 1050     Chief Complaint  Patient presents with  . Chest Pain     (Consider location/radiation/quality/duration/timing/severity/associated sxs/prior Treatment) HPI Comments: 58 year old male with a past medical history of chronic neck and back pain presenting with left-sided chest pain 3 months, worsening yesterday evening waking him up from sleep. Pain is constant, described as a pressure, non-radiating. No aggravating or alleviating factors. Despite triage summary, he denies radiation towards his left arm. He reports he has numbness and tingling down his left arm from a prior neck surgery, however states it only may be slightly worse at this time. Denies any exertional activity out of is normal. Admits to associated shortness of breath, at rest and exertional, however worse on exertion. Denies fever, chills, nausea, vomiting, diaphoresis, abdominal pain, cough or wheezing. Nonsmoker. His dad had a heart attack at the age of 60. No prior cardiac history.  Patient is a 58 y.o. male presenting with chest pain. The history is provided by the patient.  Chest Pain Associated symptoms: shortness of breath     Past Medical History  Diagnosis Date  . Chronic pain   . Back pain   . Chronic neck pain    Past Surgical History  Procedure Laterality Date  . Back surgery    . Tonsillectomy    . Neck surgery     No family history on file. History  Substance Use Topics  . Smoking status: Never Smoker   . Smokeless tobacco: Not on file  . Alcohol Use: Yes     Comment: once a month    Review of Systems  Respiratory: Positive for shortness of breath.   Cardiovascular: Positive for chest pain.  All other systems reviewed and are negative.     Allergies  Morphine and related  Home Medications   Prior to Admission medications   Medication Sig Start Date End Date Taking?  Authorizing Provider  Celecoxib (CELEBREX PO) Take by mouth.   Yes Historical Provider, MD  GABAPENTIN PO Take by mouth.   Yes Historical Provider, MD  TRAZODONE HCL PO Take by mouth at bedtime as needed.   Yes Historical Provider, MD  cyclobenzaprine (FLEXERIL) 10 MG tablet Take 10 mg by mouth 3 (three) times daily as needed for muscle spasms.    Historical Provider, MD  polyethylene glycol powder (GLYCOLAX/MIRALAX) powder Take 17 g by mouth 2 (two) times daily. 04/06/13   Veryl Speak, MD  traMADol (ULTRAM) 50 MG tablet Take 50 mg by mouth every 6 (six) hours as needed for pain.    Historical Provider, MD   BP 169/93 mmHg  Pulse 87  Resp 18  Ht 6' (1.829 m)  Wt 230 lb (104.327 kg)  BMI 31.19 kg/m2  SpO2 96% Physical Exam  Constitutional: He is oriented to person, place, and time. He appears well-developed and well-nourished. No distress.  HENT:  Head: Normocephalic and atraumatic.  Mouth/Throat: Oropharynx is clear and moist.  Eyes: Conjunctivae and EOM are normal. Pupils are equal, round, and reactive to light.  Neck: Normal range of motion. Neck supple. No JVD present.  Cardiovascular: Normal rate, regular rhythm, normal heart sounds and intact distal pulses.   No extremity edema.  Pulmonary/Chest: Effort normal and breath sounds normal. No respiratory distress.    Abdominal: Soft. Bowel sounds are normal. There is no tenderness.  Musculoskeletal: Normal range of motion.  He exhibits no edema.  Neurological: He is alert and oriented to person, place, and time. He has normal strength. No sensory deficit.  Speech fluent, goal oriented. Moves limbs without ataxia. Equal grip strength bilateral.  Skin: Skin is warm and dry. He is not diaphoretic.  Psychiatric: He has a normal mood and affect. His behavior is normal.  Nursing note and vitals reviewed.   ED Course  Procedures (including critical care time) Labs Review Labs Reviewed  COMPREHENSIVE METABOLIC PANEL - Abnormal;  Notable for the following:    Glucose, Bld 145 (*)    GFR calc non Af Amer 71 (*)    GFR calc Af Amer 82 (*)    All other components within normal limits  TROPONIN I  CBC WITH DIFFERENTIAL    Imaging Review Dg Chest 2 View  11/20/2014   CLINICAL DATA:  Chest pain for 3 months getting worse  EXAM: CHEST  2 VIEW  COMPARISON:  04/06/2013  FINDINGS: Normal heart size, mediastinal contours and pulmonary vascularity.  Tortuosity of thoracic aorta.  Minimal bronchitic changes without pulmonary infiltrate, pleural effusion or pneumothorax.  Bones unremarkable.  IMPRESSION: Minimal bronchitic changes without acute infiltrate.   Electronically Signed   By: Lavonia Dana M.D.   On: 11/20/2014 12:25     EKG Interpretation   Date/Time:  Monday November 20 2014 11:05:01 EST Ventricular Rate:  83 PR Interval:  186 QRS Duration: 162 QT Interval:  424 QTC Calculation: 498 R Axis:   -32 Text Interpretation:  Normal sinus rhythm Left axis deviation Right bundle  branch block Left ventricular hypertrophy Abnormal ECG Confirmed by DELOS   MD, DOUGLAS ([id number]) on 11/20/2014 11:08:36 AM      MDM   Final diagnoses:  Chest pain   Patient in no apparent distress. Vital signs stable. Slightly hypertensive. Pain is reproducible and ongoing for a few months. EKG without any changes from prior. Doubt cardiac, heart score 3. Workup negative. Doubt PE, low risk. Advised patient to follow-up with his PCP. He is stable for discharge. Return precautions given. Patient states understanding of treatment care plan and is agreeable.    Carman Ching, PA-C 11/20/14 1243  Veryl Speak, MD 11/20/14 [phone]

## 2014-11-20 NOTE — ED Notes (Signed)
Intermittent chest pain described as pressure radiating to left arm.  Pain awakened him last night several times and worse with exertion.

## 2014-11-20 NOTE — Discharge Instructions (Signed)
Follow-up with your primary care physician.  Chest Pain (Nonspecific) It is often hard to give a specific diagnosis for the cause of chest pain. There is always a chance that your pain could be related to something serious, such as a heart attack or a blood clot in the lungs. You need to follow up with your health care provider for further evaluation. CAUSES   Heartburn.  Pneumonia or bronchitis.  Anxiety or stress.  Inflammation around your heart (pericarditis) or lung (pleuritis or pleurisy).  A blood clot in the lung.  A collapsed lung (pneumothorax). It can develop suddenly on its own (spontaneous pneumothorax) or from trauma to the chest.  Shingles infection (herpes zoster virus). The chest wall is composed of bones, muscles, and cartilage. Any of these can be the source of the pain.  The bones can be bruised by injury.  The muscles or cartilage can be strained by coughing or overwork.  The cartilage can be affected by inflammation and become sore (costochondritis). DIAGNOSIS  Lab tests or other studies may be needed to find the cause of your pain. Your health care provider may have you take a test called an ambulatory electrocardiogram (ECG). An ECG records your heartbeat patterns over a 24-hour period. You may also have other tests, such as:  Transthoracic echocardiogram (TTE). During echocardiography, sound waves are used to evaluate how blood flows through your heart.  Transesophageal echocardiogram (TEE).  Cardiac monitoring. This allows your health care provider to monitor your heart rate and rhythm in real time.  Holter monitor. This is a portable device that records your heartbeat and can help diagnose heart arrhythmias. It allows your health care provider to track your heart activity for several days, if needed.  Stress tests by exercise or by giving medicine that makes the heart beat faster. TREATMENT   Treatment depends on what may be causing your chest pain.  Treatment may include:  Acid blockers for heartburn.  Anti-inflammatory medicine.  Pain medicine for inflammatory conditions.  Antibiotics if an infection is present.  You may be advised to change lifestyle habits. This includes stopping smoking and avoiding alcohol, caffeine, and chocolate.  You may be advised to keep your head raised (elevated) when sleeping. This reduces the chance of acid going backward from your stomach into your esophagus. Most of the time, nonspecific chest pain will improve within 2-3 days with rest and mild pain medicine.  HOME CARE INSTRUCTIONS   If antibiotics were prescribed, take them as directed. Finish them even if you start to feel better.  For the next few days, avoid physical activities that bring on chest pain. Continue physical activities as directed.  Do not use any tobacco products, including cigarettes, chewing tobacco, or electronic cigarettes.  Avoid drinking alcohol.  Only take medicine as directed by your health care provider.  Follow your health care provider's suggestions for further testing if your chest pain does not go away.  Keep any follow-up appointments you made. If you do not go to an appointment, you could develop lasting (chronic) problems with pain. If there is any problem keeping an appointment, call to reschedule. SEEK MEDICAL CARE IF:   Your chest pain does not go away, even after treatment.  You have a rash with blisters on your chest.  You have a fever. SEEK IMMEDIATE MEDICAL CARE IF:   You have increased chest pain or pain that spreads to your arm, neck, jaw, back, or abdomen.  You have shortness of breath.  You  have an increasing cough, or you cough up blood.  You have severe back or abdominal pain.  You feel nauseous or vomit.  You have severe weakness.  You faint.  You have chills. This is an emergency. Do not wait to see if the pain will go away. Get medical help at once. Call your local emergency  services (911 in [place] Do not drive yourself to the hospital. MAKE SURE YOU:   Understand these instructions.  Will watch your condition.  Will get help right away if you are not doing well or get worse. Document Released: 08/27/2005 Document Revised: 11/22/2013 Document Reviewed: 06/22/2008 [facility] Patient Information 2015 [place], [place]. This information is not intended to replace advice given to you by your health care provider. Make sure you discuss any questions you have with your health care provider.

## 2015-04-12 ENCOUNTER — Other Ambulatory Visit: Payer: Self-pay | Admitting: Radiology

## 2015-04-12 ENCOUNTER — Other Ambulatory Visit (HOSPITAL_COMMUNITY): Payer: Self-pay | Admitting: Otolaryngology

## 2015-04-12 DIAGNOSIS — R221 Localized swelling, mass and lump, neck: Secondary | ICD-10-CM

## 2015-04-13 ENCOUNTER — Ambulatory Visit (HOSPITAL_COMMUNITY)
Admission: RE | Admit: 2015-04-13 | Discharge: 2015-04-13 | Disposition: A | Discharge status: Home / Self Care | Payer: PPO | Source: Ambulatory Visit | Attending: Interventional Radiology | Admitting: Interventional Radiology

## 2015-04-13 ENCOUNTER — Encounter (HOSPITAL_COMMUNITY): Payer: Self-pay

## 2015-04-13 ENCOUNTER — Telehealth: Payer: Self-pay | Admitting: *Deleted

## 2015-04-13 DIAGNOSIS — C801 Malignant (primary) neoplasm, unspecified: Secondary | ICD-10-CM | POA: Diagnosis not present

## 2015-04-13 DIAGNOSIS — R221 Localized swelling, mass and lump, neck: Secondary | ICD-10-CM | POA: Diagnosis present

## 2015-04-13 DIAGNOSIS — C770 Secondary and unspecified malignant neoplasm of lymph nodes of head, face and neck: Secondary | ICD-10-CM | POA: Insufficient documentation

## 2015-04-13 LAB — CBC
HEMATOCRIT: 44.4 % (ref 39.0–52.0)
HEMOGLOBIN: 14.8 g/dL (ref 13.0–17.0)
MCH: 28.5 pg (ref 26.0–34.0)
MCHC: 33.3 g/dL (ref 30.0–36.0)
MCV: 85.5 fL (ref 78.0–100.0)
Platelets: 209 10*3/uL (ref 150–400)
RBC: 5.19 MIL/uL (ref 4.22–5.81)
RDW: 13.2 % (ref 11.5–15.5)
WBC: 8.2 10*3/uL (ref 4.0–10.5)

## 2015-04-13 LAB — PROTIME-INR
INR: 1.12 (ref 0.00–1.49)
PROTHROMBIN TIME: 14.6 s (ref 11.6–15.2)

## 2015-04-13 LAB — APTT: aPTT: 30 seconds (ref 24–37)

## 2015-04-13 MED ORDER — FENTANYL CITRATE (PF) 100 MCG/2ML IJ SOLN
INTRAMUSCULAR | Status: AC | PRN
  Administered 2015-04-13: 50 ug via INTRAVENOUS

## 2015-04-13 MED ORDER — MIDAZOLAM HCL 2 MG/2ML IJ SOLN
INTRAMUSCULAR | Status: AC
  Filled 2015-04-13: qty 2

## 2015-04-13 MED ORDER — SODIUM CHLORIDE 0.9 % IV SOLN
Freq: Once | INTRAVENOUS | Status: AC
  Administered 2015-04-13: 13:00:00 via INTRAVENOUS

## 2015-04-13 MED ORDER — MIDAZOLAM HCL 2 MG/2ML IJ SOLN
INTRAMUSCULAR | Status: AC | PRN
  Administered 2015-04-13: 1 mg via INTRAVENOUS

## 2015-04-13 MED ORDER — FENTANYL CITRATE (PF) 100 MCG/2ML IJ SOLN
INTRAMUSCULAR | Status: AC
  Filled 2015-04-13: qty 2

## 2015-04-13 MED ORDER — LIDOCAINE HCL (PF) 1 % IJ SOLN
INTRAMUSCULAR | Status: AC
  Filled 2015-04-13: qty 10

## 2015-04-13 NOTE — Procedures (Signed)
L neck LN Bx Core 18 g times 5 No comp

## 2015-04-13 NOTE — H&P (Signed)
Referring Physician(s): Ma,Brandon  History of Present Illness: Shane Cantu is a 59 y.o. male with a left neck mass x 1 month. He has been seen by ENT on 04/12/15, see scanned H&P and scheduled today for image guided left neck mass biopsy. He denies any chest pain, shortness of breath or palpitations. He denies any active signs of bleeding or excessive bruising. He denies any recent fever or chills. The patient denies any history of sleep apnea or chronic oxygen use. He has previously tolerated sedation without complications. The patient has had a H&P performed within the last 30 days, all history, medications, and exam have been reviewed. The patient denies any interval changes since the H&P.   Past Medical History  Diagnosis Date  . Chronic pain   . Back pain   . Chronic neck pain     Past Surgical History  Procedure Laterality Date  . Back surgery    . Tonsillectomy    . Neck surgery      Allergies: Morphine and related  Medications: Prior to Admission medications   Medication Sig Start Date End Date Taking? Authorizing Provider  Armodafinil 250 MG tablet Take 250 mg by mouth daily.   Yes Historical Provider, MD  celecoxib (CELEBREX) 200 MG capsule Take 200 mg by mouth daily.   Yes Historical Provider, MD  gabapentin (NEURONTIN) 800 MG tablet Take 2,400 mg by mouth 2 (two) times daily.   Yes Historical Provider, MD  ibuprofen (ADVIL,MOTRIN) 200 MG tablet Take 400 mg by mouth every 6 (six) hours as needed for mild pain or moderate pain.   Yes Historical Provider, MD  lansoprazole (PREVACID) 30 MG capsule Take 30 mg by mouth daily at 12 noon.   Yes Historical Provider, MD  polyethylene glycol powder (GLYCOLAX/MIRALAX) powder Take 17 g by mouth 2 (two) times daily. 04/06/13  Yes Veryl Speak, MD  traMADol (ULTRAM) 50 MG tablet Take 50 mg by mouth every 6 (six) hours as needed for pain.   Yes Historical Provider, MD  traZODone (DESYREL) 100 MG tablet Take 100 mg by mouth at  bedtime.   Yes Historical Provider, MD  cyclobenzaprine (FLEXERIL) 10 MG tablet Take 10 mg by mouth 3 (three) times daily as needed for muscle spasms.    Historical Provider, MD     History reviewed. No pertinent family history.  History   Social History  . Marital Status: Divorced    Spouse Name: N/A  . Number of Children: N/A  . Years of Education: N/A   Social History Main Topics  . Smoking status: Never Smoker   . Smokeless tobacco: Not on file  . Alcohol Use: Yes     Comment: once a month  . Drug Use: No  . Sexual Activity: No   Other Topics Concern  . None   Social History Narrative   Review of Systems: A 12 point ROS discussed and pertinent positives are indicated in the HPI above.  All other systems are negative.  Review of Systems  Vital Signs: BP 144/86 mmHg  Pulse 67  Temp(Src) 98 F (36.7 C)  Resp 18  Ht 5\' 9"  (1.753 m)  Wt 240 lb (108.863 kg)  BMI 35.43 kg/m2  Physical Exam  Constitutional: He is oriented to person, place, and time. No distress.  HENT:  Head: Atraumatic.  Large left neck mass  Neck: No tracheal deviation present.  Cardiovascular: Normal rate and regular rhythm.  Exam reveals no gallop and no friction rub.  No murmur heard. Pulmonary/Chest: Effort normal and breath sounds normal. No respiratory distress. He has no wheezes. He has no rales.  Abdominal: Soft. He exhibits no distension. There is no tenderness.  Neurological: He is alert and oriented to person, place, and time.  Skin: He is not diaphoretic.  Psychiatric: He has a normal mood and affect. His behavior is normal. Thought content normal.    Mallampati Score:  MD Evaluation Airway: WNL Heart: WNL Abdomen: WNL Chest/ Lungs: WNL ASA  Classification: 2 Mallampati/Airway Score: Two  Imaging: No results found.  Labs:  CBC:  Recent Labs  11/20/14 1115  WBC 7.2  HGB 15.3  HCT 46.6  PLT 190    COAGS: No results for input(s): INR, APTT in the last 8760  hours.  BMP:  Recent Labs  11/20/14 1115  NA 139  K 4.6  CL 101  CO2 24  GLUCOSE 145*  BUN 22  CALCIUM 9.6  CREATININE 1.10  GFRNONAA 71*  GFRAA 82*    LIVER FUNCTION TESTS:  Recent Labs  11/20/14 1115  BILITOT 0.4  AST 22  ALT 22  ALKPHOS 117  PROT 7.9  ALBUMIN 4.1   Assessment and Plan: Left neck mass x 1 month Seen by ENT 04/12/15, see scanned H&P Scheduled today for image guided left neck mass biopsy with moderate sedation The patient has been NPO, no blood thinners taken, labs and vitals have been reviewed. Risks and Benefits discussed with the patient including, but not limited to bleeding, infection, damage to adjacent structures or low yield requiring additional tests. All of the patient's questions were answered, patient is agreeable to proceed. Consent signed and in chart.   Thank you for this interesting consult.  I greatly enjoyed meeting Shane Cantu and look forward to participating in their care.  SignedHedy Jacob 04/13/2015, 1:04 PM

## 2015-04-13 NOTE — Telephone Encounter (Signed)
Place introductory phone call to patient, including confirmation of 04/18/15 2:00 pm appt with [name].  LVM, requested return call.  Gayleen Orem, RN, BSN, [place] at [place] [phone]

## 2015-04-13 NOTE — Discharge Instructions (Signed)

## 2015-04-14 ENCOUNTER — Ambulatory Visit (HOSPITAL_COMMUNITY): Payer: PPO

## 2015-04-16 ENCOUNTER — Telehealth: Payer: Self-pay | Admitting: *Deleted

## 2015-04-16 NOTE — Telephone Encounter (Signed)
Patient called: 1. He expressed concerns about post-bx swelling and tenderness.  He indicated the ice-packs recommended at bx are helpful.  He denied any pain.  I recommended he contact his ENT Dr. Gaylyn Cheers with concerns. 2. I explained that Dr. Gaylyn Cheers will make a referral upon his review of bx results.  I informed him of tentative appts scheduled for this Wednesday pending receipt of referral:  1:30 [facility], 2:00 consult with [name]. 3. I requested that he obtain a disc copy of CT that was taken earlier this month to bring when he sees [name]. 4. He verbalized understanding of information/guidance provided.  Gayleen Orem, RN, BSN, [place] at [place] [phone]

## 2015-04-16 NOTE — Telephone Encounter (Signed)
Patient returned my Friday VM, indicated he is eager to pursue treatment at [facility].    He stated he is available at any time for an appt.  I indicated I would follow-up with his ENT Dr. Gaylyn Cheers, get back with him with appt time.  He expressed appreciation.  Gayleen Orem, RN, BSN, [place] at [place] [phone]

## 2015-04-16 NOTE — Telephone Encounter (Signed)
Spoke with patient's ENT practice, Dr. Lynann Beaver nurse Lizbeth Bark.    I indicated I spoke with patient, he indicated his wish to be treated at [facility].    She stated Dr. Gaylyn Cheers will make referral upon review of biopsy results, conducted 04/13/15.   I provided [employer] phone # for the referral.  Gayleen Orem, RN, BSN, [place] at [place] [phone]

## 2015-04-18 ENCOUNTER — Encounter: Payer: Self-pay | Admitting: Hematology and Oncology

## 2015-04-18 ENCOUNTER — Telehealth: Payer: Self-pay | Admitting: Hematology and Oncology

## 2015-04-18 ENCOUNTER — Telehealth: Payer: Self-pay | Admitting: *Deleted

## 2015-04-18 ENCOUNTER — Ambulatory Visit (HOSPITAL_BASED_OUTPATIENT_CLINIC_OR_DEPARTMENT_OTHER): Payer: PPO | Admitting: Hematology and Oncology

## 2015-04-18 ENCOUNTER — Ambulatory Visit: Payer: PPO

## 2015-04-18 ENCOUNTER — Encounter: Payer: Self-pay | Admitting: *Deleted

## 2015-04-18 VITALS — BP 121/75 | HR 75 | Temp 98.0°F | Resp 18 | Ht 69.0 in | Wt 251.9 lb

## 2015-04-18 DIAGNOSIS — M542 Cervicalgia: Secondary | ICD-10-CM | POA: Diagnosis not present

## 2015-04-18 DIAGNOSIS — Z808 Family history of malignant neoplasm of other organs or systems: Secondary | ICD-10-CM

## 2015-04-18 DIAGNOSIS — C029 Malignant neoplasm of tongue, unspecified: Secondary | ICD-10-CM | POA: Diagnosis not present

## 2015-04-18 DIAGNOSIS — C779 Secondary and unspecified malignant neoplasm of lymph node, unspecified: Secondary | ICD-10-CM

## 2015-04-18 DIAGNOSIS — C01 Malignant neoplasm of base of tongue: Secondary | ICD-10-CM | POA: Insufficient documentation

## 2015-04-18 DIAGNOSIS — G8929 Other chronic pain: Secondary | ICD-10-CM

## 2015-04-18 MED ORDER — HYDROMORPHONE HCL 2 MG PO TABS: 2.0000 mg | ORAL_TABLET | Freq: Four times a day (QID) | ORAL | Status: DC | PRN
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
## 2015-04-18 NOTE — Telephone Encounter (Signed)
Aaron Edelman with [facility] called for clarification of dilaudid order due to allergies on file.  Collaborative nurse recalls she and provider reviewing pain medications with patient.  [name] mobile number given to call for clarification.

## 2015-04-18 NOTE — Assessment & Plan Note (Signed)
Stage of the disease is to be determined, a PET/CT scan has been ordered.   Prognosis would depend on the results for the PET/CT scan, to be discussed and reviewed in the next visit.   Treatment options would include chemotherapy only, radiation only or chemotherapy in combination with radiation therapy.      In preparation for treatment, the patient will need the following tests or referrals, to be arranged #1 Referral to Radiation Oncology for consultation.  #2 Referral to dentist for full dental evaluation and possible dental extraction  #3 PET/CT scan.  #4 Possible referral for feeding tube placement.  #5 Possible Infusaport placement #6 Referral to Speech Pathologist  #7 Referral to Nutritionist  #8 Referral to Social Worker #9 Possible Referral to chemotherapy class to learn about practical tips while on treatment.  #10 Blood work   I think he may be a good candidate for concurrent chemo/RT with high dose cisplatin I will him back in 2 weeks for further assessment

## 2015-04-18 NOTE — Telephone Encounter (Signed)
Pt confirmed labs/ov per 05/18 POF, gave pt AVS and Calendar.... KJ, Dr. Ritta Slot office will contact pt through workqueue per staff member, Speech through [facility] also will contact pt, Rick in Radiation will contact pt with [facility] for Radiation, PT and Nutrition w/BN through [facility].Marland KitchenMarland KitchenMarland Kitchen

## 2015-04-18 NOTE — Telephone Encounter (Signed)
Done

## 2015-04-18 NOTE — Assessment & Plan Note (Signed)
The patient has acute on chronic neck pain. I recommend him to continue on Neurontin but to stop Tramadol He report allergies to morphine. He has taken oxycodone in the past.  I prefer to try hydromorphone since it is available IV We discussed about the role of pain management in the oncology clinic. The prescribed pain regimen is for short term use only.  After the patient has completed the prescribed chemotherapy treatment, the pain medications will be slowly tapered off.  The patient agreed to be compliant with prescribed pain regimen and promised not to share the prescribed medications. Any lost medications or missed prescription will not be refilled sooner than anticipated time when the patient's prescription is expected to run out. We also discussed narcotics refill policy in the clinic.  The patient is educated to check the pill bottles in the middle of the week and ensure there is adequate supply to last through the weekend until next appointment or available business day.  The oncology service has a strict policy not to refill pain medications after business hours or the weekend.  If the patient is found to have violated the verbal agreement as stated no further pain medications will be prescribed in the future.

## 2015-04-18 NOTE — Progress Notes (Signed)
Oncology Nurse Navigator Documentation  Oncology Nurse Navigator Flowsheets 04/18/2015  Referral date to RadOnc/MedOnc [phone]  Navigator Encounter Type Initial MedOnc  Patient Visit Type Medonc  Barriers/Navigation Needs No barriers at this time  Interventions None required   Met with patient during initial consult with [name].  [name] accompanied him.   Further introduced myself as his Navigator, explained my role as a member of the Care Team, provided contact information, encouraged them to contact me with questions/concerns as treatments/procedures begin. Provided New Patient Information packet:  Contact information for physicians, navigator and other member of his Care Team.  Advance Directive information ([place] blue pamphlet)  Fall Prevention Patient Safety Plan  Appointment Guideline  [facility] map with highlight of [place] with post-consult appt scheduling. Showed them the location of Dr. Ritta Slot office and [facility] as reference for future appts, including arrival procedure for these appts.   They understand I will coordinate ancillary appts for 05/02/15 MDC. They verbalized understanding of information provided.    Gayleen Orem, RN, BSN, [place] at [place] [phone]

## 2015-04-20 ENCOUNTER — Telehealth: Payer: Self-pay | Admitting: *Deleted

## 2015-04-20 NOTE — Telephone Encounter (Signed)
Oncology Nurse Navigator Documentation  Oncology Nurse Navigator Flowsheets 04/18/2015 04/20/2015  Referral date to RadOnc/MedOnc 77824 -  Navigator Encounter Type Initial MedOnc Telephone - Returned patient VM, provided appt clarification for 04/24/15 1300 PET with 1230 arrival, 05/02/15 appts including attendance at [facility] to see Dr. Isidore Moos and other Care Team members.  He stated he has not heard from Dental Medicine, I indicated I will follow-up on his behalf.  Patient Visit Type Medonc -  Barriers/Navigation Needs No barriers at this time -  Interventions None required -  Time Spent with Patient - [place], RN, BSN, [place] at [place] [phone]

## 2015-04-24 ENCOUNTER — Encounter (HOSPITAL_COMMUNITY): Payer: Self-pay

## 2015-04-24 ENCOUNTER — Telehealth: Payer: Self-pay | Admitting: *Deleted

## 2015-04-24 ENCOUNTER — Other Ambulatory Visit: Payer: Self-pay | Admitting: *Deleted

## 2015-04-24 ENCOUNTER — Ambulatory Visit (HOSPITAL_COMMUNITY)
Admission: RE | Admit: 2015-04-24 | Discharge: 2015-04-24 | Disposition: A | Discharge status: Home / Self Care | Payer: PPO | Source: Ambulatory Visit | Attending: Hematology and Oncology | Admitting: Hematology and Oncology

## 2015-04-24 ENCOUNTER — Telehealth: Payer: Self-pay | Admitting: Nurse Practitioner

## 2015-04-24 DIAGNOSIS — K029 Dental caries, unspecified: Secondary | ICD-10-CM | POA: Diagnosis not present

## 2015-04-24 DIAGNOSIS — K045 Chronic apical periodontitis: Secondary | ICD-10-CM | POA: Diagnosis not present

## 2015-04-24 DIAGNOSIS — G8929 Other chronic pain: Secondary | ICD-10-CM | POA: Diagnosis not present

## 2015-04-24 DIAGNOSIS — I10 Essential (primary) hypertension: Secondary | ICD-10-CM | POA: Diagnosis not present

## 2015-04-24 DIAGNOSIS — Z6837 Body mass index (BMI) 37.0-37.9, adult: Secondary | ICD-10-CM | POA: Diagnosis not present

## 2015-04-24 DIAGNOSIS — K219 Gastro-esophageal reflux disease without esophagitis: Secondary | ICD-10-CM | POA: Diagnosis not present

## 2015-04-24 DIAGNOSIS — M542 Cervicalgia: Secondary | ICD-10-CM | POA: Diagnosis not present

## 2015-04-24 DIAGNOSIS — C01 Malignant neoplasm of base of tongue: Secondary | ICD-10-CM | POA: Diagnosis present

## 2015-04-24 DIAGNOSIS — G4730 Sleep apnea, unspecified: Secondary | ICD-10-CM | POA: Diagnosis not present

## 2015-04-24 DIAGNOSIS — Z885 Allergy status to narcotic agent status: Secondary | ICD-10-CM | POA: Diagnosis not present

## 2015-04-24 DIAGNOSIS — M1990 Unspecified osteoarthritis, unspecified site: Secondary | ICD-10-CM | POA: Diagnosis not present

## 2015-04-24 DIAGNOSIS — C029 Malignant neoplasm of tongue, unspecified: Secondary | ICD-10-CM

## 2015-04-24 LAB — GLUCOSE, CAPILLARY: Glucose-Capillary: 123 mg/dL — ABNORMAL HIGH (ref 65–99)

## 2015-04-24 MED ORDER — FLUDEOXYGLUCOSE F - 18 (FDG) INJECTION
13.2000 | Freq: Once | INTRAVENOUS | Status: AC | PRN
  Administered 2015-04-24: 13.2 via INTRAVENOUS

## 2015-04-24 MED ORDER — FLUDEOXYGLUCOSE F - 18 (FDG) INJECTION: 14.1000 | Freq: Once | INTRAVENOUS | Status: DC | PRN

## 2015-04-24 NOTE — Telephone Encounter (Signed)
Oncology Nurse Navigator Documentation  Oncology Nurse Navigator Flowsheets 04/18/2015 04/20/2015 04/24/2015  Referral date to RadOnc/MedOnc 14431 - -  Navigator Encounter Type Initial MedOnc Telephone Telephone  [phone] - Patient called to report increased swelling in neck, pain marginally resolved with dilaudid, itchy nose r/t dilaudid.  He denied any sense of airway obstruction.  I suggested application of ice pack to neck to help with discomfort.  0844 - Called patient, guided him to come to [facility] after PET to meet with Selena Lesser, NP, to address pain control and itch rxt to dilaudid.  He verbalized understanding.  Patient Visit Type Medonc - -  Barriers/Navigation Needs No barriers at this time - -  Interventions None required - -  Time Spent with Patient - [place], Therapist, sports, BSN, [place] at [place] [phone]

## 2015-04-24 NOTE — Telephone Encounter (Signed)
Pt came to walk in clinic due to pain medication causing his nose to itch. Pt was unable to wait to be seen. He had his grandson with him and left without getting care. TC to patient on cell phone- Pt reports the itch is triggered by the dilaudid. Pt states he does not have any SOB, he is able to swallow oral secretions without any problem. He last took the pain medication this morning and has not taken it since. Pt states he is in a lot of pain and needs something else for pain. Advised pt to take rash/allergy protocol- Benadryl 25mg  q 6 hours and Pepcid 20mg  every 12 hours until itching resolves.  Pt advised ER/911 precautions. Pt verbalized an understanding. Advised pt to call with any concerns. He is aware of walk in symptom management availability.

## 2015-04-24 NOTE — Telephone Encounter (Signed)
[name] called for me to add the patient to Shane Cantu,she then called back to not make the appointment as the patient did not want to wait to be seen   Shane Cantu

## 2015-04-25 ENCOUNTER — Encounter (HOSPITAL_COMMUNITY): Payer: Self-pay | Admitting: *Deleted

## 2015-04-25 ENCOUNTER — Ambulatory Visit (HOSPITAL_COMMUNITY): Payer: Self-pay | Admitting: Dentistry

## 2015-04-25 ENCOUNTER — Encounter (HOSPITAL_COMMUNITY): Payer: Self-pay | Admitting: Dentistry

## 2015-04-25 VITALS — BP 137/77 | HR 66 | Temp 98.2°F

## 2015-04-25 DIAGNOSIS — Z972 Presence of dental prosthetic device (complete) (partial): Secondary | ICD-10-CM

## 2015-04-25 DIAGNOSIS — Z01818 Encounter for other preprocedural examination: Secondary | ICD-10-CM

## 2015-04-25 DIAGNOSIS — K083 Retained dental root: Secondary | ICD-10-CM | POA: Insufficient documentation

## 2015-04-25 DIAGNOSIS — K08409 Partial loss of teeth, unspecified cause, unspecified class: Secondary | ICD-10-CM

## 2015-04-25 DIAGNOSIS — M264 Malocclusion, unspecified: Secondary | ICD-10-CM

## 2015-04-25 DIAGNOSIS — C01 Malignant neoplasm of base of tongue: Secondary | ICD-10-CM | POA: Diagnosis not present

## 2015-04-25 DIAGNOSIS — K0530 Chronic periodontitis, unspecified: Secondary | ICD-10-CM | POA: Insufficient documentation

## 2015-04-25 DIAGNOSIS — K045 Chronic apical periodontitis: Secondary | ICD-10-CM | POA: Insufficient documentation

## 2015-04-25 DIAGNOSIS — K036 Deposits [accretions] on teeth: Secondary | ICD-10-CM

## 2015-04-25 DIAGNOSIS — K0889 Other specified disorders of teeth and supporting structures: Secondary | ICD-10-CM

## 2015-04-25 DIAGNOSIS — IMO0002 Reserved for concepts with insufficient information to code with codable children: Secondary | ICD-10-CM

## 2015-04-25 MED ORDER — CEFAZOLIN SODIUM-DEXTROSE 2-3 GM-% IV SOLR
2.0000 g | INTRAVENOUS | Status: AC
  Administered 2015-04-26: 2 g via INTRAVENOUS

## 2015-04-25 NOTE — Patient Instructions (Signed)
RADIATION THERAPY AND DECISIONS REGARDING YOUR TEETH  Xerostomia (dry mouth) Your salivary glands may be in the filed of radiation.  Radiation may include all or part of your saliva glands.  This will cause your saliva to dry up and you will have a dry mouth.  The dry mouth will be for the rest of your life unless your radiation oncologist tells you otherwise.  Your saliva has many functions:  Saliva wets your tongue for speaking.  It coats your teeth and the inside of your mouth for easier movement.  It helps with chewing and swallowing food.  It helps clean away harmful acid and toxic products made by the germs in your mouth, therefore it helps prevent cavities.  It kills some germs in your mouth and helps to prevent gum disease.  It helps to carry flavor to your taste buds.  Once you have lost your saliva you will be at higher risk for tooth decay and gum disease.  What can be done to help improve your mouth when there's not enough saliva:  1.  Your dentist may give a prescription for Salagen.  It will not bring back all of your saliva but may bring back some of it.  Also your saliva may be thick and ropy or white and foamy. It will not feel like it use to feel.  2.  You will need to swish with water every time your mouth feels dry.  YOU CANNOT suck on any cough drops, mints, lemon drops, candy, vitamin C or any other products.  You cannot use anything other than water to make your mouth feel less dry.  If you want to drink anything else you have to drink it all at once and brush afterwards.  Be sure to discuss the details of your diet habits with your dentist or hygienist.  Radiation caries: This is decay that happens very quickly once your mouth is very dry due to radiation therapy.  Normally cavities take six months to two years to become a problem.  When you have dry mouth cavities may take as little as eight weeks to cause you a problem.  This is why dental check ups every two  months are necessary as long as you have a dry mouth. Radiation caries typically, but not always, start at your gum line where it is hard to see the cavity.  It is therefore also hard to fill these cavities adequately.  This high rate of cavities happens because your mouth no longer has saliva and therefore the acid made by the germs starts the decay process.  Whenever you eat anything the germs in your mouth change the food into acid.  The acid then burns a small hole in your tooth.  This small hole is the beginning of a cavity.  If this is not treated then it will grow bigger and become a cavity.  The Jared to avoid this hole getting bigger is to use fluoride every evening as prescribed by your dentist.  You have to make sure that your teeth are very clean before you use the fluoride.  This fluoride in turn will strengthen your teeth and prepare them for another day of fighting acid.  If you develop radiation caries many times the damage is so large that you will have to have all your teeth removed.  This could be a big problem if some of these teeth are in the field of radiation.  Further details of why this could be   a big problem will follow.  (See Osteoradionecrosis).  Loss of taste (dysgeusia) This happens to varying degrees once you've had radiation therapy to your jaw region.  Many times taste is not completely lost but becomes limited.  The loss of taste is mostly due to radiation affecting your taste buds.  However if you have no saliva in your mouth to carry the flavor to your taste buds it would be difficult for your taste buds to taste anything.  That is why using water or a prescription for Salagen prior to meals and during meals may help with some of the taste.  Keep in mind that taste generally returns very slowly over the course of several months or several years after radiation therapy.  Don't give up hope.  Trismus According to your Radiation Oncologist your TMJ or jaw joints are going to be  partially or fully in the field of radiation.  This means that over time the muscles that help you open and close your mouth may get stiff.  This will potentially result in your not being able to open your mouth wide enough or as wide as you can open it now.  Le me give you an example of how slowly this happens and how unaware people are of it.  A gentlemen that had radiation therapy two years ago came back to me complaining that bananas are just too large for him to be able to fit them in between his teeth.  He was not able to open wide enough to bite into a banana.  This happens slowly and over a period of time.  What do we do to try and prevent this?  Your dentist will probably give you a stack of sticks called a trismus exercise device .  This stack will help your remind your muscles and your jaw joint to open up to the same distance every day.  Use these sticks every morning when you wake up according to the instructions given by the dentist.   You must use these sticks for at least one to two years after radiation therapy.  The reason for that is because it happens so slowly and keeps going on for about two years after radiation therapy.  Your hospital dentist will help you monitor your mouth opening and make sure that it's not getting smaller.  Osteoradionecrosis (ORN) This is a condition where your jaw bone after having had radiation therapy becomes very dry.  It has very little blood supply to keep it alive.  If you develop a cavity that turns into an abscess or an infection then the jaw bone does not have enough blood supply to help fight the infection.  At this point it is very likely that the infection could cause the death of your jaw bone.  When you have dead bone it has to be removed.  Therefore you might end up having to have surgery to remove part of your jaw bone, the part of the jaw bone that has been affected.   Healing is also a problem if you are to have surgery in the areas where the bone  has had radiation therapy.  The same reasons apply.  If you have surgery you need more blood supply which is not available.  When blood supply and oxygen are not available again, there is a chance for the bone to die.  Occasionally ORN happens on its own with no obvious reason.  This is quite rare.  We believe that   patients who continue to smoke and/or drink alcohol have a higher chance of having this bone problem.  Therefore once your jaw bone has had radiation therapy if there are any teeth in that area, you should never have them pulled.  You should also never have any surgery on your teeth or gums in that area unless the oral surgeon or Periodontist is aware of your history of radiation. There is some expensive management techniques that might be used to limit your risks.  The risks for ORN either from infection or spontaneous ( or on it's own) are life long.    TRISMUS  Trismus is a condition where the jaw does not allow the mouth to open as wide as it usually does.  This can happen almost suddenly, or in other cases the process is so slow, it is hard to notice it-until it is too far along.  When the jaw joints and/or muscles have been exposed to radiation treatments, the onset of Trismus is very slow.  This is because the muscles are losing their stretching ability over a long period of time, as long as 2 YEARS after the end of radiation.  It is therefore important to exercise these muscles and joints.  TRISMUS EXERCISES   Stack of tongue depressors measuring the same or a little less than the last documented MIO (Maximum Interincisal Opening).  Secure them with a rubber band on both ends.  Place the stack in the patient's mouth, supporting the other end.  Allow 30 seconds for muscle stretching.  Rest for a few seconds.  Repeat 3-5 times  For all radiation patients, this exercise is recommended in the mornings and evenings unless otherwise instructed.  The exercise should be done for  a period of 2 YEARS after the end of radiation.  MIO should be checked routinely on recall dental visits by the general dentist or the hospital dentist.  The patient is advised to report any changes, soreness, or difficulties encountered when doing the exercises.  

## 2015-04-25 NOTE — Progress Notes (Addendum)
DENTAL CONSULTATION  Date of Consultation:  04/25/2015 Patient Name:   Shane Cantu Date of Birth:   1956/11/13 Medical Record Number: [id number]  VITALS: BP 137/77 mmHg  Pulse 66  Temp(Src) 98.2 F (36.8 C) (Oral)  CHIEF COMPLAINT: The patient was referred by [name] for a dental consultation.   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 Patient with anticipated chemoradiation therapy. Patient is now seen as part of a medically necessary pre-chemoradiation therapy dental protocol examination.  The patient currently denies having any acute toothaches, swellings, or abscesses. Patient does have a history of lower left toothache symptoms over the past several months. Patient indicates the area of tooth numbers 20 and 21 as the source of the dental pain. Patient describes a sharp, constant pain for weeks at a time. This has been occurring for "several years". Patient has not seen a dentist for over 10 years. Patient was last seen by Dr. Aubery Lapping to have a tooth pulled. Patient denies any complications with that dental extraction. Patient has upper and lower acrylic partial dentures that were fabricated at least 20 years ago by Dr. Aubery Lapping. Patient indicates that these are " loose". Patient is interested in having all remaining teeth extracted at this time.  PROBLEM LIST: Patient Active Problem List   Diagnosis Date Noted  . Carcinoma of base of tongue 04/25/2015  . Retained dental roots 04/25/2015  . 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 Chronic periodontitis 04/25/2015  . Tongue cancer 04/18/2015  . Chronic neck pain 04/18/2015    PMH: Past Medical History  Diagnosis Date  . Chronic pain   . Back pain   . Chronic neck pain   . History of kidney stones   . Allergic rhinitis   . Hypertension     PSH: Past Surgical History  Procedure Laterality Date  . Back surgery    . Tonsillectomy    . Neck  surgery    . Lymph node biopsy    . Nasal sinusotomy      ALLERGIES: Allergies  Allergen Reactions  . Morphine And Related Anaphylaxis    MEDICATIONS: Current Outpatient Prescriptions  Medication Sig Dispense Refill  . amLODipine (NORVASC) 5 MG tablet Take by mouth daily.    . Armodafinil 250 MG tablet Take 250 mg by mouth daily.    . celecoxib (CELEBREX) 200 MG capsule Take 200 mg by mouth daily.    Marland Kitchen gabapentin (NEURONTIN) 800 MG tablet Take 2,400 mg by mouth 2 (two) times daily.    Marland Kitchen HYDROmorphone (DILAUDID) 2 MG tablet Take 1 tablet (2 mg total) by mouth every 6 (six) hours as needed for severe pain. 60 tablet 0  . ibuprofen (ADVIL,MOTRIN) 200 MG tablet Take 400 mg by mouth every 6 (six) hours as needed for mild pain or moderate pain.    Marland Kitchen lansoprazole (PREVACID) 30 MG capsule Take 30 mg by mouth daily at 12 noon.    . polyethylene glycol powder (GLYCOLAX/MIRALAX) powder Take 17 g by mouth 2 (two) times daily. 255 g 0  . traMADol (ULTRAM) 50 MG tablet Take 50 mg by mouth every 6 (six) hours as needed for pain.    . traZODone (DESYREL) 100 MG tablet Take 100 mg by mouth at bedtime.     No current facility-administered medications for this visit.    LABS: Lab Results  Component Value Date   WBC 8.2 04/13/2015  HGB 14.8 04/13/2015   HCT 44.4 04/13/2015   MCV 85.5 04/13/2015   PLT 209 04/13/2015      Component Value Date/Time   NA 139 11/20/2014 1115   K 4.6 11/20/2014 1115   CL 101 11/20/2014 1115   CO2 24 11/20/2014 1115   GLUCOSE 145* 11/20/2014 1115   BUN 22 11/20/2014 1115   CREATININE 1.10 11/20/2014 1115   CALCIUM 9.6 11/20/2014 1115   GFRNONAA 71* 11/20/2014 1115   GFRAA 82* 11/20/2014 1115   Lab Results  Component Value Date   INR 1.12 04/13/2015   No results found for: PTT  SOCIAL HISTORY: History   Social History  . Marital Status: Divorced    Spouse Name: N/A  . Number of Children: 2  . Years of Education: N/A   Occupational History  .  Not on file.   Social History Main Topics  . Smoking status: Never Smoker   . Smokeless tobacco: Never Used  . Alcohol Use: Yes     Comment: once a month  . Drug Use: No  . Sexual Activity: No   Other Topics Concern  . Not on file   Social History Narrative   The patient is divorced with 2 children.   Patient is disabled. Patient previously worked with Pharmacologist.   Patient lives in [place].   Patient has never smoked. Patient has never used smokeless tobacco.   Patient with occasional use of alcohol.       FAMILY HISTORY: Family History  Problem Relation Age of Onset  . Cancer Father     throat ca  . Cancer Brother     pituitary ca    REVIEW OF SYSTEMS: Reviewed with the patient and is included in dental record.  DENTAL HISTORY: CHIEF COMPLAINT: The patient was referred by [name] for a dental consultation.   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 Patient with anticipated chemoradiation therapy. Patient is now seen as part of a medically necessary pre-chemoradiation therapy dental protocol examination.  The patient currently denies having any acute toothaches, swellings, or abscesses. Patient does have a history of lower left toothache symptoms over the past several months. Patient indicates the area of tooth numbers 20 and 21 as the source of the dental pain. Patient describes a sharp, constant pain for weeks at a time. This has been occurring for "several years". Patient has not seen a dentist for over 10 years. Patient was last seen by Dr. Aubery Lapping to have a tooth pulled. Patient denies any complications with that dental extraction. Patient has upper and lower acrylic partial dentures that were fabricated at least 20 years ago by Dr. Aubery Lapping. Patient indicates that these are " loose". Patient is interested in having all remaining teeth extracted at this time.   DENTAL  EXAMINATION: GENERAL: The patient is a well-developed, well-nourished male in no acute distress. HEAD AND NECK: There is no right neck lymphadenopathy. There is a large 3 cm lymph node swelling consistent with cancer diagnosis. The patient denies acute TMJ symptoms. INTRAORAL EXAM: The patient has normal saliva. There is no evidence of oral abscess formation. Patient has a maximum interincisal opening of 53 mm from edentulous ridge to edentulous ridge. The patient has a V-shaped maxillary and mandibular alveolar arch form. DENTITION: The patient is missing tooth numbers 1 through 5, 7, 8, 9, 10, 13, 14, 16, 17, 18, 19, 23,  24, 25, 26, 29, 30, 31, and 32. There are retained roots in the area of tooth numbers 6, 12, 20, and 21. PERIODONTAL: Patient has chronic periodontitis with plaque and calculus accumulations, gingival recession, and tooth mobility. DENTAL CARIES/SUBOPTIMAL RESTORATIONS: Dental caries are noted as per dental charting form. ENDODONTIC: Patient currently denies acute pulpitis symptoms. Patient does have a history of toothache symptoms. Patient has a periapical radiolucency associated with tooth numbers 6, 12, 20, and 21. CROWN AND BRIDGE: There are no crown or bridge restorations. PROSTHODONTIC: Patient has upper and lower acrylic partial dentures. These are ill fitting and have a poor occlusal relationship. OCCLUSION: Patient has a poor occlusal scheme secondary to multiple missing teeth, supra-eruption and drifting of the unopposed teeth into the edentulous areas, and lack of replacement of missing teeth with clinically acceptable dental prostheses.  RADIOGRAPHIC INTERPRETATION: An orthopantogram was taken and supplemented with a full series of 9 periapical radiographs. There are multiple missing teeth. There are multiple retained root segments. Dental caries are noted. There are multiple areas of periapical pathology and radiolucency. Dental caries are noted. There is supra-eruption  and drifting of the unopposed teeth into the edentulous areas. There is moderate to severe bone loss noted. The maxillary right sinuses appear to be opacified.  ASSESSMENTS: 1. Carcinoma of the left base of tongue with left neck metastases 2. Pre-chemoradiation therapy dental protocol 3. Chronic apical periodontitis 4. Dental caries 5. Multiple retained root segments 6. Chronic periodontitis of bone loss 7. Gingival recession 8. Tooth mobility 9. Multiple missing teeth 10. Ill fitting maxillary and mandibular acrylic partial dentures 11. Poor occlusal scheme and malocclusion  PLAN/RECOMMENDATIONS: 1. I discussed the risks, benefits, and complications of various treatment options with the patient in relationship to the medical and dental conditions, anticipated chemoradiation therapy, and chemoradiation therapy side effects to include xerostomia, radiation caries, trismus, mucositis, taste changes, gum and jawbone changes, and risk for infection and osteoradionecrosis. We discussed various treatment options to include no treatment, multiple extractions with alveoloplasty, pre-prosthetic surgery as indicated, periodontal therapy, dental restorations, root canal therapy, crown and bridge therapy, implant therapy, and replacement of missing teeth as indicated. The patient currently wishes to proceed with extraction of remaining teeth with alveoloplasty in the operating room with general anesthesia on 04/26/2015 at 9:30 AM at [facility]. Patient will then follow-up with a dentist of his choice for fabrication of upper lower complete dentures 2-3 months after his last radiation therapy. The patient is aware of potential complications for bleeding, bruising, swelling, nerve damage, soft tissue damage, sinus involvement, root tip fracture, fracture the mandible, and other complications associated with general anesthesia. Patient is aware of other potential complications not mentioned above.  2.  Discussion of findings with medical team and coordination of future medical and dental care as needed.  I spent in excess of  120 minutes during the conduct of this consultation and >50% of this time involved direct face-to-face encounter for counseling and/or coordination of the patient's care.    [name], DDS

## 2015-04-25 NOTE — Addendum Note (Signed)
Addended by: [name] on: 04/25/2015 10:23 AM   Modules accepted: Orders

## 2015-04-26 ENCOUNTER — Ambulatory Visit (HOSPITAL_COMMUNITY): Payer: PPO | Admitting: Certified Registered Nurse Anesthetist

## 2015-04-26 ENCOUNTER — Ambulatory Visit (HOSPITAL_COMMUNITY)
Admission: RE | Admit: 2015-04-26 | Discharge: 2015-04-26 | Disposition: A | Discharge status: Home / Self Care | Payer: PPO | Source: Ambulatory Visit | Attending: Dentistry | Admitting: Dentistry

## 2015-04-26 ENCOUNTER — Encounter (HOSPITAL_COMMUNITY): Payer: Self-pay | Admitting: *Deleted

## 2015-04-26 ENCOUNTER — Encounter (HOSPITAL_COMMUNITY)
Admission: RE | Disposition: A | Discharge status: Home / Self Care | Payer: Self-pay | Source: Ambulatory Visit | Attending: Dentistry

## 2015-04-26 DIAGNOSIS — G8929 Other chronic pain: Secondary | ICD-10-CM | POA: Insufficient documentation

## 2015-04-26 DIAGNOSIS — K083 Retained dental root: Secondary | ICD-10-CM

## 2015-04-26 DIAGNOSIS — G4730 Sleep apnea, unspecified: Secondary | ICD-10-CM | POA: Insufficient documentation

## 2015-04-26 DIAGNOSIS — C01 Malignant neoplasm of base of tongue: Secondary | ICD-10-CM | POA: Insufficient documentation

## 2015-04-26 DIAGNOSIS — K029 Dental caries, unspecified: Secondary | ICD-10-CM | POA: Insufficient documentation

## 2015-04-26 DIAGNOSIS — K045 Chronic apical periodontitis: Secondary | ICD-10-CM | POA: Insufficient documentation

## 2015-04-26 DIAGNOSIS — I10 Essential (primary) hypertension: Secondary | ICD-10-CM | POA: Insufficient documentation

## 2015-04-26 DIAGNOSIS — K0530 Chronic periodontitis, unspecified: Secondary | ICD-10-CM

## 2015-04-26 DIAGNOSIS — M542 Cervicalgia: Secondary | ICD-10-CM | POA: Insufficient documentation

## 2015-04-26 DIAGNOSIS — K219 Gastro-esophageal reflux disease without esophagitis: Secondary | ICD-10-CM | POA: Insufficient documentation

## 2015-04-26 DIAGNOSIS — Z6837 Body mass index (BMI) 37.0-37.9, adult: Secondary | ICD-10-CM | POA: Insufficient documentation

## 2015-04-26 DIAGNOSIS — Z885 Allergy status to narcotic agent status: Secondary | ICD-10-CM | POA: Insufficient documentation

## 2015-04-26 DIAGNOSIS — M1990 Unspecified osteoarthritis, unspecified site: Secondary | ICD-10-CM | POA: Insufficient documentation

## 2015-04-26 HISTORY — PX: MULTIPLE EXTRACTIONS WITH ALVEOLOPLASTY: SHX5342

## 2015-04-26 HISTORY — DX: Sleep apnea, unspecified: G47.30

## 2015-04-26 HISTORY — DX: Polyneuropathy, unspecified: G62.9

## 2015-04-26 HISTORY — DX: Gastro-esophageal reflux disease without esophagitis: K21.9

## 2015-04-26 HISTORY — DX: Malignant (primary) neoplasm, unspecified: C80.1

## 2015-04-26 HISTORY — DX: Unspecified osteoarthritis, unspecified site: M19.90

## 2015-04-26 LAB — BASIC METABOLIC PANEL
Anion gap: 11 (ref 5–15)
BUN: 25 mg/dL — ABNORMAL HIGH (ref 6–20)
CALCIUM: 9.4 mg/dL (ref 8.9–10.3)
CO2: 24 mmol/L (ref 22–32)
CREATININE: 1.26 mg/dL — AB (ref 0.61–1.24)
Chloride: 101 mmol/L (ref 101–111)
GFR calc Af Amer: >60 mL/min (ref >60)
GFR, EST NON AFRICAN AMERICAN: 60 mL/min — AB (ref >60)
GLUCOSE: 146 mg/dL — AB (ref 65–99)
Potassium: 4 mmol/L (ref 3.5–5.1)
Sodium: 136 mmol/L (ref 135–145)

## 2015-04-26 SURGERY — MULTIPLE EXTRACTION WITH ALVEOLOPLASTY
Anesthesia: General | Site: Mouth

## 2015-04-26 MED ORDER — ONDANSETRON HCL 4 MG/2ML IJ SOLN
INTRAMUSCULAR | Status: DC | PRN
  Administered 2015-04-26: 4 mg via INTRAVENOUS

## 2015-04-26 MED ORDER — 0.9 % SODIUM CHLORIDE (POUR BTL) OPTIME
TOPICAL | Status: DC | PRN
  Administered 2015-04-26: 1000 mL

## 2015-04-26 MED ORDER — LIDOCAINE-EPINEPHRINE 2 %-1:100000 IJ SOLN
INTRAMUSCULAR | Status: DC | PRN
  Administered 2015-04-26: 1.7 mL via INTRADERMAL
  Administered 2015-04-26: 6.8 mg via INTRADERMAL

## 2015-04-26 MED ORDER — OXYCODONE HCL 5 MG PO TABS: 5.0000 mg | ORAL_TABLET | Freq: Once | ORAL | Status: DC

## 2015-04-26 MED ORDER — KETOROLAC TROMETHAMINE 30 MG/ML IJ SOLN
INTRAMUSCULAR | Status: AC
  Filled 2015-04-26: qty 1

## 2015-04-26 MED ORDER — FENTANYL CITRATE (PF) 250 MCG/5ML IJ SOLN
INTRAMUSCULAR | Status: AC
  Filled 2015-04-26: qty 5

## 2015-04-26 MED ORDER — KETOROLAC TROMETHAMINE 30 MG/ML IJ SOLN
30.0000 mg | Freq: Once | INTRAMUSCULAR | Status: AC
  Administered 2015-04-26: 30 mg via INTRAVENOUS

## 2015-04-26 MED ORDER — PROMETHAZINE HCL 25 MG/ML IJ SOLN: 6.2500 mg | INTRAMUSCULAR | Status: DC | PRN

## 2015-04-26 MED ORDER — FENTANYL CITRATE (PF) 100 MCG/2ML IJ SOLN
INTRAMUSCULAR | Status: DC | PRN
  Administered 2015-04-26 (×2): 50 ug via INTRAVENOUS
  Administered 2015-04-26: 150 ug via INTRAVENOUS

## 2015-04-26 MED ORDER — AMOXICILLIN 500 MG PO CAPS: ORAL_CAPSULE | ORAL | Status: DC

## 2015-04-26 MED ORDER — HYDROMORPHONE HCL 1 MG/ML IJ SOLN
INTRAMUSCULAR | Status: AC
  Filled 2015-04-26: qty 2

## 2015-04-26 MED ORDER — OXYMETAZOLINE HCL 0.05 % NA SOLN
2.0000 | NASAL | Status: DC
  Filled 2015-04-26: qty 15

## 2015-04-26 MED ORDER — SUCCINYLCHOLINE CHLORIDE 20 MG/ML IJ SOLN
INTRAMUSCULAR | Status: DC | PRN
  Administered 2015-04-26: 120 mg via INTRAVENOUS

## 2015-04-26 MED ORDER — MIDAZOLAM HCL 5 MG/5ML IJ SOLN
INTRAMUSCULAR | Status: DC | PRN
  Administered 2015-04-26: 2 mg via INTRAVENOUS

## 2015-04-26 MED ORDER — MIDAZOLAM HCL 2 MG/2ML IJ SOLN
INTRAMUSCULAR | Status: AC
  Filled 2015-04-26: qty 2

## 2015-04-26 MED ORDER — PROPOFOL 10 MG/ML IV BOLUS
INTRAVENOUS | Status: DC | PRN
  Administered 2015-04-26: 200 mg via INTRAVENOUS

## 2015-04-26 MED ORDER — OXYCODONE HCL 5 MG/5ML PO SOLN
ORAL | Status: AC
  Administered 2015-04-26: 5 mg
  Filled 2015-04-26: qty 5

## 2015-04-26 MED ORDER — LACTATED RINGERS IV SOLN
INTRAVENOUS | Status: DC
  Administered 2015-04-26 (×2): via INTRAVENOUS

## 2015-04-26 MED ORDER — PHENYLEPHRINE HCL 10 MG/ML IJ SOLN
INTRAMUSCULAR | Status: DC | PRN
  Administered 2015-04-26: 160 ug via INTRAVENOUS
  Administered 2015-04-26: 80 ug via INTRAVENOUS
  Administered 2015-04-26: 40 ug via INTRAVENOUS

## 2015-04-26 MED ORDER — HYDROMORPHONE HCL 1 MG/ML IJ SOLN
0.2500 mg | INTRAMUSCULAR | Status: DC | PRN
  Administered 2015-04-26 (×4): 0.5 mg via INTRAVENOUS

## 2015-04-26 MED ORDER — LORATADINE-PSEUDOEPHEDRINE ER 5-120 MG PO TB12: 1.0000 | ORAL_TABLET | Freq: Two times a day (BID) | ORAL | Status: DC

## 2015-04-26 MED ORDER — EPHEDRINE SULFATE 50 MG/ML IJ SOLN
INTRAMUSCULAR | Status: DC | PRN
  Administered 2015-04-26: 10 mg via INTRAVENOUS

## 2015-04-26 MED ORDER — LIDOCAINE-EPINEPHRINE 2 %-1:100000 IJ SOLN
INTRAMUSCULAR | Status: AC
  Filled 2015-04-26: qty 10.2

## 2015-04-26 MED ORDER — BUPIVACAINE-EPINEPHRINE 0.5% -1:200000 IJ SOLN
INTRAMUSCULAR | Status: DC | PRN
  Administered 2015-04-26: 3.6 mg

## 2015-04-26 MED ORDER — LIDOCAINE HCL (CARDIAC) 20 MG/ML IV SOLN
INTRAVENOUS | Status: DC | PRN
  Administered 2015-04-26: 80 mg via INTRAVENOUS

## 2015-04-26 SURGICAL SUPPLY — 35 items
ALCOHOL 70% 16 OZ (MISCELLANEOUS) ×3 IMPLANT
ATTRACTOMAT 16X20 MAGNETIC DRP (DRAPES) ×3 IMPLANT
BLADE SURG 15 STRL LF DISP TIS (BLADE) ×2 IMPLANT
BLADE SURG 15 STRL SS (BLADE) ×6
COVER SURGICAL LIGHT HANDLE (MISCELLANEOUS) ×3 IMPLANT
GAUZE PACKING FOLDED 2  STR (GAUZE/BANDAGES/DRESSINGS) ×2
GAUZE PACKING FOLDED 2 STR (GAUZE/BANDAGES/DRESSINGS) ×1 IMPLANT
GAUZE SPONGE 4X4 16PLY XRAY LF (GAUZE/BANDAGES/DRESSINGS) ×3 IMPLANT
GLOVE BIOGEL PI IND STRL 6 (GLOVE) ×1 IMPLANT
GLOVE BIOGEL PI INDICATOR 6 (GLOVE) ×2
GLOVE SURG ORTHO 8.0 STRL STRW (GLOVE) ×3 IMPLANT
GLOVE SURG SS PI 6.0 STRL IVOR (GLOVE) ×3 IMPLANT
GOWN STRL REUS W/ TWL LRG LVL3 (GOWN DISPOSABLE) ×1 IMPLANT
GOWN STRL REUS W/TWL 2XL LVL3 (GOWN DISPOSABLE) ×3 IMPLANT
GOWN STRL REUS W/TWL LRG LVL3 (GOWN DISPOSABLE) ×3
HEMOSTAT SURGICEL 2X14 (HEMOSTASIS) ×3 IMPLANT
KIT BASIN OR (CUSTOM PROCEDURE TRAY) ×3 IMPLANT
KIT ROOM TURNOVER OR (KITS) ×3 IMPLANT
MANIFOLD NEPTUNE WASTE (CANNULA) ×3 IMPLANT
NDL BLUNT 16X1.5 OR ONLY (NEEDLE) ×1 IMPLANT
NEEDLE BLUNT 16X1.5 OR ONLY (NEEDLE) ×3 IMPLANT
NS IRRIG 1000ML POUR BTL (IV SOLUTION) ×3 IMPLANT
PACK EENT II TURBAN DRAPE (CUSTOM PROCEDURE TRAY) ×3 IMPLANT
PAD ARMBOARD 7.5X6 YLW CONV (MISCELLANEOUS) ×3 IMPLANT
SPONGE SURGIFOAM ABS GEL 100 (HEMOSTASIS) IMPLANT
SPONGE SURGIFOAM ABS GEL 12-7 (HEMOSTASIS) IMPLANT
SPONGE SURGIFOAM ABS GEL SZ50 (HEMOSTASIS) IMPLANT
SUCTION FRAZIER TIP 10 FR DISP (SUCTIONS) ×3 IMPLANT
SUT CHROMIC 3 0 PS 2 (SUTURE) ×10 IMPLANT
SUT CHROMIC 4 0 P 3 18 (SUTURE) ×2 IMPLANT
SYR 50ML SLIP (SYRINGE) ×3 IMPLANT
TOWEL OR 17X26 10 PK STRL BLUE (TOWEL DISPOSABLE) ×3 IMPLANT
TUBE CONNECTING 12'X1/4 (SUCTIONS) ×1
TUBE CONNECTING 12X1/4 (SUCTIONS) ×2 IMPLANT
YANKAUER SUCT BULB TIP NO VENT (SUCTIONS) ×3 IMPLANT

## 2015-04-26 NOTE — Progress Notes (Signed)
PRE-OPERATIVE NOTE:  04/26/2015   Shane Cantu [id number]  VITALS: BP 194/76 mmHg  Pulse 62  Temp(Src) 98 F (36.7 C) (Oral)  Resp 18  Wt 251 lb (113.853 kg)  SpO2 99%  Lab Results  Component Value Date   WBC 8.2 04/13/2015   HGB 14.8 04/13/2015   HCT 44.4 04/13/2015   MCV 85.5 04/13/2015   PLT 209 04/13/2015   BMET    Component Value Date/Time   NA 139 11/20/2014 1115   K 4.6 11/20/2014 1115   CL 101 11/20/2014 1115   CO2 24 11/20/2014 1115   GLUCOSE 145* 11/20/2014 1115   BUN 22 11/20/2014 1115   CREATININE 1.10 11/20/2014 1115   CALCIUM 9.6 11/20/2014 1115   GFRNONAA 71* 11/20/2014 1115   GFRAA 82* 11/20/2014 1115    Lab Results  Component Value Date   INR 1.12 04/13/2015   No results found for: PTT   Shane Cantu presents for extraction of remaining teeth with alveoloplasty in the operating room with general anesthesia.    SUBJECTIVE: The patient denies any acute medical or dental changes and agrees to proceed with treatment as planned.  EXAM: No sign of acute dental changes.  ASSESSMENT: Patient is affected by chronic apical periodontitis, multiple retained root segments, chronic periodontitis, and multiple loose teeth.  PLAN: Patient agrees to proceed with treatment as planned in the operating room as previously discussed and accepts the risks, benefits, and complications of the proposed treatment. Patient is aware of the risk for bleeding, bruising, swelling, infection, pain, nerve damage, soft tissue damage, sinus involvement, root tip fracture, mandible fracture, and the risks of complications associated with the anesthesia. Patient also is aware of the potential for other complications not mentioned above.   [name], DDS

## 2015-04-26 NOTE — Anesthesia Procedure Notes (Signed)
Procedure Name: Intubation Date/Time: 04/26/2015 9:16 AM Performed by: Shirlyn Goltz Pre-anesthesia Checklist: Patient identified, Emergency Drugs available, Suction available and Patient being monitored Patient Re-evaluated:Patient Re-evaluated prior to inductionOxygen Delivery Method: Circle system utilized Preoxygenation: Pre-oxygenation with 100% oxygen Intubation Type: IV induction Ventilation: Mask ventilation without difficulty and Oral airway inserted - appropriate to patient size Laryngoscope Size: Glidescope (elective glidescope for throat/tongue mass) Grade View: Grade I Tube type: Oral Tube size: 7.0 mm Number of attempts: 1 Airway Equipment and Method: Video-laryngoscopy and Stylet Placement Confirmation: ETT inserted through vocal cords under direct vision,  positive ETCO2 and breath sounds checked- equal and bilateral Secured at: 22 cm Tube secured with: Tape Dental Injury: Teeth and Oropharynx as per pre-operative assessment
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
## 2015-04-26 NOTE — Anesthesia Postprocedure Evaluation (Signed)
  Anesthesia Post-op Note  Patient: Shane Cantu  Procedure(s) Performed: Procedure(s): Extraction of tooth #'s 6,11,12,15,20,21,22,27,28 with alveoloplasty (N/A)  Patient Location: PACU  Anesthesia Type:General  Level of Consciousness: awake and alert   Airway and Oxygen Therapy: Patient Spontanous Breathing  Post-op Pain: mild  Post-op Assessment: Post-op Vital signs reviewed  Post-op Vital Signs: stable  Last Vitals:  Filed Vitals:   04/26/15 1122  BP: 158/86  Pulse:   Temp:   Resp:     Complications: No apparent anesthesia complications

## 2015-04-26 NOTE — Progress Notes (Signed)
Dr. Enrique Sack at bedside, aware that Afrin ordered and still awaiting arrival from pharmacy. Per Dr. Enrique Sack, pt will have oral ETT; therefore, Afrin not needed, order discontinued, not given.

## 2015-04-26 NOTE — Anesthesia Preprocedure Evaluation (Addendum)
Anesthesia Evaluation  Patient identified by MRN, date of birth, ID band Patient awake  General Assessment Comment:metastatic head and neck tumor  Reviewed: Allergy & Precautions, NPO status , Patient's Chart, lab work & pertinent test results  History of Anesthesia Complications Negative for: history of anesthetic complications  Airway Mallampati: II  TM Distance: <3 FB Neck ROM: Limited    Dental  (+) Teeth Intact, Poor Dentition   Pulmonary sleep apnea ,  breath sounds clear to auscultation        Cardiovascular hypertension, Rhythm:Regular Rate:Normal     Neuro/Psych  Headaches,    GI/Hepatic GERD-  ,  Endo/Other  Morbid obesity  Renal/GU      Musculoskeletal  (+) Arthritis -,   Abdominal (+) + obese,   Peds  Hematology   Anesthesia Other Findings   Reproductive/Obstetrics                            Anesthesia Physical Anesthesia Plan  ASA: III  Anesthesia Plan: General   Post-op Pain Management:    Induction: Intravenous  Airway Management Planned: Oral ETT and Nasal ETT  Additional Equipment:   Intra-op Plan:   Post-operative Plan: Extubation in OR  Informed Consent: I have reviewed the patients History and Physical, chart, labs and discussed the procedure including the risks, benefits and alternatives for the proposed anesthesia with the patient or authorized representative who has indicated his/her understanding and acceptance.   Dental advisory given  Plan Discussed with: CRNA and Surgeon  Anesthesia Plan Comments:         Anesthesia Quick Evaluation

## 2015-04-26 NOTE — Transfer of Care (Signed)
Immediate Anesthesia Transfer of Care Note  Patient: Shane Cantu  Procedure(s) Performed: Procedure(s): Extraction of tooth #'s 6,11,12,15,20,21,22,27,28 with alveoloplasty (N/A)  Patient Location: PACU  Anesthesia Type:General  Level of Consciousness: awake, alert , oriented and patient cooperative  Airway & Oxygen Therapy: Patient Spontanous Breathing and Patient connected to face mask oxygen  Post-op Assessment: Report given to RN, Post -op Vital signs reviewed and stable, Patient moving all extremities, Patient moving all extremities X 4 and Patient able to stick tongue midline  Post vital signs: Reviewed and stable  Last Vitals:  Filed Vitals:   04/26/15 0706  BP: 194/76  Pulse: 62  Temp: 36.7 C  Resp: 18    Complications: No apparent anesthesia complications

## 2015-04-26 NOTE — Op Note (Signed)
OPERATIVE REPORT  Patient:            Shane Cantu Date of Birth:  1955/12/17 MRN:                [id number]   DATE OF PROCEDURE:  04/26/2015  PREOPERATIVE DIAGNOSES: 1. Carcinoma of the left base of tongue 2. Pre-chemoradiation therapy dental protocol 3. Chronic apical periodontitis 4. Multiple retained root segments 5. Dental caries 6. Chronic periodontitis   POSTOPERATIVE DIAGNOSES: 1. Carcinoma of the left base of tongue 2. Pre-chemoradiation therapy dental protocol 3. Chronic apical periodontitis 4. Multiple retained root segments 5. Dental caries 6. Chronic periodontitis  OPERATIONS: 1. Multiple extraction of tooth numbers 6, 11, 12, 15, 20, 21, 22, 27, 28 2. 4 Quadrants of alveoloplasty   SURGEON: [name], DDS  ASSISTANT: Camie Patience, (dental assistant)  ANESTHESIA: General anesthesia via oral endotracheal tube.  MEDICATIONS: 1. Ancef 2 g IV prior to invasive dental procedures. 2. Local anesthesia with a total utilization of 4 carpules each containing 34 mg of lidocaine with 0.017 mg of epinephrine as well as 2 carpules each containing 9 mg of bupivacaine with 0.009 mg of epinephrine.  SPECIMENS: There are 9 teeth that were discarded.  DRAINS: None  CULTURES: None  COMPLICATIONS: None   ESTIMATED BLOOD LOSS: 100 mLs.  INTRAVENOUS FLUIDS: 1100 mLs of Lactated ringers solution.  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  A dental consultation was then requested as part of a medically necessary pre-chemoradiation therapy dental protocol examination.  The patient was examined and treatment planned for extraction of remaining teeth with alveoloplasty as needed.  This treatment plan was formulated to decrease the risks and complications associated with dental infection from affecting the patient's systemic health and to prevent future complications such as infection and  osteoradionecrosis.  OPERATIVE FINDINGS: Patient was examined operating room number 7.  The teeth were identified for extraction. The patient was noted be affected by chronic periodontitis, chronic apical periodontitis, multiple retained root segments, dental caries, and tooth mobility.   DESCRIPTION OF PROCEDURE: Patient was brought to the main operating room number 7. Patient was then placed in the supine position on the operating table. General anesthesia was then induced per the anesthesia team. The patient was then prepped and draped in the usual manner for dental medicine procedure. A timeout was performed. The patient was identified and procedures were verified. A throat pack was placed at this time. The oral cavity was then thoroughly examined with the findings noted above. The patient was then ready for dental medicine procedure as follows:  Local anesthesia was then administered sequentially with a total utilization of 4 carpules each containing 34 mg of lidocaine with 0.017 mg of epinephrine as well as 2 carpules  each containing 9 mg bupivacaine with 0.009 mg of epinephrine.  The Maxillary left and right quadrants first approached. Anesthesia was then delivered utilizing infiltration with lidocaine with epinephrine. A #15 blade incision was then made from the distal of #4 and extended to the distal of #16.  A  surgical flap was then carefully reflected. Appropriate amounts of buccal and interseptal bone were then removed utilizing a surgical handpiece and bur and copious amounts of sterile water around tooth #15.  The teeth were then subluxated with a series of straight elevators. Tooth numbers 6, 11, 12, 15  were then removed with a 150 forceps without complications. Alveoloplasty was then performed utilizing  a ronguers and bone file. No obvious sinus exposure was noted in the area #15 but sinus precautions will be followed appropriately. The surgical site was then irrigated with copious  amounts of sterile saline. The tissues were approximated and trimmed appropriately. A piece of Surgifoam was then placed the extraction socket area numbers 15.  The maxillary left surgical site was then closed from the maxillary left tuberosity and extended to the mesial of #9 utilizing 3-0 chromic gut suture in a continuous interrupted suture technique 1. The maxillary right surgical site was then closed from the distal of #4 and extended the mesial #8 utilizing 3-0 chromic gut suture in a continuous suture technique 1 .   At this point time, the mandibular quadrants were approached. The patient was given bilateral inferior alveolar nerve blocks and long buccal nerve blocks utilizing the bupivacaine with epinephrine. Further infiltration was then achieved utilizing the lidocaine with epinephrine. A 15 blade incision was then made from the distal of number 19 and extended to the distal of #30 .  A surgical flap was then carefully reflected. Appropriate amounts of buccal and interseptal bone were then removed utilizing a surgical handpiece and copious amount of sterile water around tooth numbers 22, 27, and 28. The lower teeth were then subluxated with a series of straight elevators. Tooth numbers 20, 21, 22, 27, 28 were then removed with a 151 forceps. Alveoloplasty was then performed utilizing a rongeurs and bone file. Significant bone loss was noted in the mandibular anterior area #25-27 . The tissues were approximated and trimmed appropriately. The surgical sites were then irrigated with copious amounts of sterile saline 4. The mandibular left surgical site was then closed from the distal of  19 and extended to the mesial of #24 utilizing 3-0 chromic gut suture in a continuous interrupted suture technique 1. 4 interrupted sutures were then placed to close surgical site from #24 through 27. The mandibular right surgical site was then further closed from the distal of #30 and extended to the mesial #27  utilizing 3-0 chromic gut suture in a continuous interrupted suture technique 1.   At this point time, the entire mouth was irrigated with copious amounts of sterile saline. The patient was examined for complications, seeing none, the dental medicine procedure was deemed to be complete. The throat pack was removed at this time. An oral airway was then placed at the request of the anesthesia team. A series of 4 x 4 gauze were placed in the mouth to aid hemostasis. The patient was then handed over to the anesthesia team for final disposition. After an appropriate amount of time, the patient was extubated and taken to the postanesthsia care unit in good condition. All counts were correct for the dental medicine procedure. Patient has appropriate pain medication use at home. Patient was prescribed amoxicillin 500 mg. Patient is take 1 capsule every 8 hours for the next 10 days. Patient also was prescribed Claritin-D to use twice daily for the next 10 days. Patient will follow sinus precautions avoid blowing his nose, sucking on straws, smoking, and sneezing as instructed. Patient to return to dental clinic in approximately 7-10 days for evaluation for suture removal.   [name], DDS.

## 2015-04-26 NOTE — Discharge Instructions (Signed)

## 2015-04-26 NOTE — Progress Notes (Signed)
Pharmacy called regarding need for Afrin, not in Short Stay pyxis, will send to tube 25.

## 2015-04-27 ENCOUNTER — Telehealth: Payer: Self-pay | Admitting: *Deleted

## 2015-04-27 ENCOUNTER — Encounter (HOSPITAL_COMMUNITY): Payer: Self-pay | Admitting: Dentistry

## 2015-04-27 NOTE — Telephone Encounter (Addendum)
Oncology Nurse Navigator Documentation  Oncology Nurse Navigator Flowsheets 04/18/2015 04/20/2015 04/24/2015 04/27/2015  Referral date to RadOnc/MedOnc 04/18/2015 - - -  Navigator Encounter Type Initial MedOnc Telephone Telephone Telephone - 1139  Confirmed patients attendance at [facility] next Wednesday.  He understands to arrive by 12:00 for his appt with [name].  He stated the Benadryl and Pepcid guidance provided by RN has helped with itchy reaction to dilaudid.  He reported pain is presently being managed.  Patient Visit Type Medonc - - -  Barriers/Navigation Needs No barriers at this time - - -  Interventions None required - - -  Time Spent with Patient - 15 15 15     Gayleen Orem, RN, BSN, [place] [facility] [place] [phone]

## 2015-05-02 ENCOUNTER — Other Ambulatory Visit: Payer: PPO

## 2015-05-02 ENCOUNTER — Encounter: Payer: Self-pay | Admitting: *Deleted

## 2015-05-02 ENCOUNTER — Ambulatory Visit
Admission: RE | Admit: 2015-05-02 | Discharge: 2015-05-02 | Disposition: A | Discharge status: Home / Self Care | Payer: PPO | Source: Ambulatory Visit | Attending: Radiation Oncology | Admitting: Radiation Oncology

## 2015-05-02 ENCOUNTER — Encounter: Payer: Self-pay | Admitting: General Practice

## 2015-05-02 ENCOUNTER — Ambulatory Visit (HOSPITAL_BASED_OUTPATIENT_CLINIC_OR_DEPARTMENT_OTHER): Payer: PPO | Admitting: Hematology and Oncology

## 2015-05-02 ENCOUNTER — Telehealth: Payer: Self-pay | Admitting: Hematology and Oncology

## 2015-05-02 ENCOUNTER — Ambulatory Visit: Payer: PPO | Admitting: Nutrition

## 2015-05-02 ENCOUNTER — Ambulatory Visit: Payer: PPO | Attending: Hematology and Oncology | Admitting: Physical Therapy

## 2015-05-02 ENCOUNTER — Encounter: Payer: Self-pay | Admitting: Hematology and Oncology

## 2015-05-02 VITALS — BP 145/82 | HR 69 | Temp 98.4°F | Resp 12 | Wt 250.3 lb

## 2015-05-02 DIAGNOSIS — C779 Secondary and unspecified malignant neoplasm of lymph node, unspecified: Secondary | ICD-10-CM | POA: Diagnosis not present

## 2015-05-02 DIAGNOSIS — G4730 Sleep apnea, unspecified: Secondary | ICD-10-CM | POA: Insufficient documentation

## 2015-05-02 DIAGNOSIS — R293 Abnormal posture: Secondary | ICD-10-CM | POA: Diagnosis present

## 2015-05-02 DIAGNOSIS — R911 Solitary pulmonary nodule: Secondary | ICD-10-CM | POA: Diagnosis not present

## 2015-05-02 DIAGNOSIS — I10 Essential (primary) hypertension: Secondary | ICD-10-CM | POA: Insufficient documentation

## 2015-05-02 DIAGNOSIS — Z79899 Other long term (current) drug therapy: Secondary | ICD-10-CM | POA: Insufficient documentation

## 2015-05-02 DIAGNOSIS — C7800 Secondary malignant neoplasm of unspecified lung: Secondary | ICD-10-CM

## 2015-05-02 DIAGNOSIS — M549 Dorsalgia, unspecified: Secondary | ICD-10-CM | POA: Insufficient documentation

## 2015-05-02 DIAGNOSIS — M542 Cervicalgia: Secondary | ICD-10-CM | POA: Insufficient documentation

## 2015-05-02 DIAGNOSIS — C029 Malignant neoplasm of tongue, unspecified: Secondary | ICD-10-CM | POA: Diagnosis not present

## 2015-05-02 DIAGNOSIS — G8929 Other chronic pain: Secondary | ICD-10-CM | POA: Insufficient documentation

## 2015-05-02 DIAGNOSIS — K219 Gastro-esophageal reflux disease without esophagitis: Secondary | ICD-10-CM | POA: Diagnosis not present

## 2015-05-02 DIAGNOSIS — C7951 Secondary malignant neoplasm of bone: Secondary | ICD-10-CM | POA: Insufficient documentation

## 2015-05-02 DIAGNOSIS — C01 Malignant neoplasm of base of tongue: Secondary | ICD-10-CM | POA: Insufficient documentation

## 2015-05-02 DIAGNOSIS — G629 Polyneuropathy, unspecified: Secondary | ICD-10-CM | POA: Diagnosis not present

## 2015-05-02 DIAGNOSIS — Z79891 Long term (current) use of opiate analgesic: Secondary | ICD-10-CM | POA: Diagnosis not present

## 2015-05-02 MED ORDER — HYDROMORPHONE HCL 2 MG PO TABS: 2.0000 mg | ORAL_TABLET | Freq: Four times a day (QID) | ORAL | Status: DC | PRN

## 2015-05-02 MED ORDER — LORAZEPAM 0.5 MG PO TABS
0.5000 mg | ORAL_TABLET | ORAL | Status: AC
  Administered 2015-05-02: 0.5 mg via ORAL
  Filled 2015-05-02: qty 1

## 2015-05-02 MED ORDER — DEXAMETHASONE 2 MG PO TABS: 2.0000 mg | ORAL_TABLET | Freq: Two times a day (BID) | ORAL | Status: DC
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
## 2015-05-02 NOTE — Progress Notes (Unsigned)
Spiritual Care Note  Met Shane Cantu and his daughter Shane Cantu in chemo class.  They were in good spirits, despite myriad stressors.  Per pt, he has significant pain in leg (referring from back).  Per dtr, she has five kids (83 mos to 74 y), works from home, and is Warehouse manager of pt's support.  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  Plan to follow up by phone, but please also page as needs arise.  Thank you.  [place], [place]

## 2015-05-02 NOTE — Telephone Encounter (Signed)
s.w. pt and advised on JUNE appt....IR will call pt to sched port

## 2015-05-02 NOTE — Progress Notes (Signed)
Patient was seen in head and neck clinic.  59 year old male diagnosed with metastatic tongue cancer.  Past medical history includes chronic back pain and neck pain.  Medications include Celebrex, Neurontin, Prevacid, and MiraLAX.  Labs were reviewed.  Height: 69 inches. Weight: 250 pounds. BMI: 36.95.  Patient reports he will receive both chemotherapy and radiation treatment. He states treatment plan has changed and he is unsure of his schedule. He currently denies nutrition impact symptoms.  Nutrition diagnosis:  Predicted suboptimal energy intake related to diagnosis of tongue cancer and associated treatments as evidenced by history or presence of a condition for which research shows an increased incidence of suboptimal energy intake.  Intervention:  Patient was educated to consume small frequent meals and snacks to avoid loss of lean body mass. Reviewed high protein high calorie foods with patient and provided fact sheet. Educated patient on potential side effects with chemoradiation therapy and provided fact sheets. Questions were answered.  Teach back method was used.  Monitoring, evaluation, goals: Patient will tolerate adequate calories and protein to minimize loss of lean body mass throughout treatment.  Next visit: To be scheduled.  **Disclaimer: This note was dictated with voice recognition software. Similar sounding words can inadvertently be transcribed and this note may contain transcription errors which may not have been corrected upon publication of note.**

## 2015-05-02 NOTE — Progress Notes (Signed)
[facility] OFFICE PROGRESS NOTE  Patient Care Team: Leota Sauers, RN as Oncology Nurse Navigator [name], MD as Consulting Physician (Hematology and Oncology) Eppie Gibson, MD as Attending Physician (Radiation Oncology) Karie Mainland, RD as Dietitian (Nutrition)  SUMMARY OF ONCOLOGIC HISTORY:   Tongue cancer   04/18/2015 Initial Diagnosis Tongue cancer   04/24/2015 Imaging PET CT showed tongue cancer, lung nodule and possible bone mets   04/26/2015 Surgery He had dental extractions    INTERVAL HISTORY: Please see below for problem oriented charting. He returns today for further follow-up. His pain is well controlled with pain medicine, Dilaudid every 6 hours. He complained of possible sciatica pain on the left leg. He denies new neurological deficit. He denies swallowing difficulties.  REVIEW OF SYSTEMS:   Constitutional: Denies fevers, chills or abnormal weight loss Eyes: Denies blurriness of vision Ears, nose, mouth, throat, and face: Denies mucositis or sore throat Respiratory: Denies cough, dyspnea or wheezes Cardiovascular: Denies palpitation, chest discomfort or lower extremity swelling Gastrointestinal:  Denies nausea, heartburn or change in bowel habits Skin: Denies abnormal skin rashes Lymphatics: Denies new lymphadenopathy or easy bruising Neurological:Denies numbness, tingling or new weaknesses Behavioral/Psych: Mood is stable, no new changes  All other systems were reviewed with the patient and are negative.  I have reviewed the past medical history, past surgical history, social history and family history with the patient and they are unchanged from previous note.  ALLERGIES:  is allergic to morphine and related.  MEDICATIONS:  Current Outpatient Prescriptions  Medication Sig Dispense Refill  . amLODipine (NORVASC) 5 MG tablet Take 5 mg by mouth daily.     Marland Kitchen amoxicillin (AMOXIL) 500 MG capsule Take one capsule by mouth every 8 hours until  all gone. 30 capsule 0  . Armodafinil 250 MG tablet Take 250 mg by mouth daily.    . celecoxib (CELEBREX) 200 MG capsule Take 200 mg by mouth daily.    Marland Kitchen dexamethasone (DECADRON) 2 MG tablet Take 1 tablet (2 mg total) by mouth 2 (two) times daily. 60 tablet 1  . gabapentin (NEURONTIN) 800 MG tablet Take 2,400 mg by mouth 2 (two) times daily.    Marland Kitchen HYDROmorphone (DILAUDID) 2 MG tablet Take 1 tablet (2 mg total) by mouth every 6 (six) hours as needed for severe pain. 60 tablet 0  . ibuprofen (ADVIL,MOTRIN) 200 MG tablet Take 400 mg by mouth every 6 (six) hours as needed for mild pain or moderate pain.    Marland Kitchen lansoprazole (PREVACID) 30 MG capsule Take 30 mg by mouth daily at 12 noon.    . loratadine-pseudoephedrine (CLARITIN-D 12 HOUR) 5-120 MG per tablet Take 1 tablet by mouth 2 (two) times daily. 20 tablet 0  . polyethylene glycol powder (GLYCOLAX/MIRALAX) powder Take 17 g by mouth 2 (two) times daily. 255 g 0  . traMADol (ULTRAM) 50 MG tablet Take 50 mg by mouth every 6 (six) hours as needed for pain.    . traZODone (DESYREL) 100 MG tablet Take 100 mg by mouth at bedtime.     No current facility-administered medications for this visit.    PHYSICAL EXAMINATION: ECOG PERFORMANCE STATUS: 1 - Symptomatic but completely ambulatory GENERAL:alert, no distress and comfortable SKIN: skin color, texture, turgor are normal, no rashes or significant lesions EYES: normal, Conjunctiva are pink and non-injected, sclera clear Musculoskeletal:no cyanosis of digits and no clubbing  NEURO: alert & oriented x 3 with fluent speech, no focal motor/sensory deficits  LABORATORY DATA:  I have reviewed the data as listed    Component Value Date/Time   NA 136 04/26/2015 0713   K 4.0 04/26/2015 0713   CL 101 04/26/2015 0713   CO2 24 04/26/2015 0713   GLUCOSE 146* 04/26/2015 0713   BUN 25* 04/26/2015 0713   CREATININE 1.26* 04/26/2015 0713   CALCIUM 9.4 04/26/2015 0713   PROT 7.9 11/20/2014 1115   ALBUMIN 4.1  11/20/2014 1115   AST 22 11/20/2014 1115   ALT 22 11/20/2014 1115   ALKPHOS 117 11/20/2014 1115   BILITOT 0.4 11/20/2014 1115   GFRNONAA 60* 04/26/2015 0713   GFRAA >60 04/26/2015 0713    No results found for: SPEP, UPEP  Lab Results  Component Value Date   WBC 8.2 04/13/2015   NEUTROABS 5.1 11/20/2014   HGB 14.8 04/13/2015   HCT 44.4 04/13/2015   MCV 85.5 04/13/2015   PLT 209 04/13/2015      Chemistry      Component Value Date/Time   NA 136 04/26/2015 0713   K 4.0 04/26/2015 0713   CL 101 04/26/2015 0713   CO2 24 04/26/2015 0713   BUN 25* 04/26/2015 0713   CREATININE 1.26* 04/26/2015 0713      Component Value Date/Time   CALCIUM 9.4 04/26/2015 0713   ALKPHOS 117 11/20/2014 1115   AST 22 11/20/2014 1115   ALT 22 11/20/2014 1115   BILITOT 0.4 11/20/2014 1115       RADIOGRAPHIC STUDIES: I reviewed the imaging study with him and his daughter I have personally reviewed the radiological images as listed and agreed with the findings in the report.   ASSESSMENT & PLAN:  Tongue cancer I have reviewed his case at the ENT tumor board today. I reviewed the imaging in great detail with the patient and his daughter. 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 Any form of treatment would be strictly palliative only. I do not recommend concurrent chemoradiation therapy. I recommend consideration for palliative radiation therapy followed by systemic palliative chemotherapy. He may benefit from local control with palliative radiation therapy to the oropharynx. He could potentially get into side effects of sore throat and dehydration. I recommend placement of port for possible IV fluid support in the future. In the meantime, I will continue to manage pain control aggressively. He has benefited from hydromorphone prescription recently. I will refill  that along with addition of low-dose dexamethasone to help with bone pain.   Chronic neck pain He has chronic neck pain from degenerative arthritis and probably from cancer. I will prescribe hydromorphone as before and will plan on adding dexamethasone low-dose twice a day to help with bone pain.    Orders Placed This Encounter  Procedures  . IR Fluoro Guide CV Line Right    Indicate type of CVC ordering    Standing Status: Future     Number of Occurrences:      Standing Expiration Date: 07/01/2016    Order Specific Question:  Reason for exam:    Answer:  need chemo    Order Specific Question:  Preferred Imaging Location?    Answer:  [facility]   All questions were answered. The patient knows to call the clinic with any problems, questions or concerns. No barriers to learning was detected. I spent 30 minutes counseling the patient face to face. The total time spent in the appointment was 40 minutes  and more than 50% was on counseling and review of test results     [facility], Erza [place], MD 05/02/2015 2:10 PM

## 2015-05-02 NOTE — Assessment & Plan Note (Signed)
I have reviewed his case at the ENT tumor board today. I reviewed the imaging in great detail with the patient and his daughter. 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 Any form of treatment would be strictly palliative only. I do not recommend concurrent chemoradiation therapy. I recommend consideration for palliative radiation therapy followed by systemic palliative chemotherapy. He may benefit from local control with palliative radiation therapy to the oropharynx. He could potentially get into side effects of sore throat and dehydration. I recommend placement of port for possible IV fluid support in the future. In the meantime, I will continue to manage pain control aggressively. He has benefited from hydromorphone prescription recently. I will refill that along with addition of low-dose dexamethasone to help with bone pain.

## 2015-05-02 NOTE — Assessment & Plan Note (Signed)
He has chronic neck pain from degenerative arthritis and probably from cancer. I will prescribe hydromorphone as before and will plan on adding dexamethasone low-dose twice a day to help with bone pain.
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
## 2015-05-02 NOTE — Progress Notes (Signed)

## 2015-05-02 NOTE — Progress Notes (Signed)
He rates his pain as a 7 on a scale of 0-10. constant, sharp and burning over right knee, buttock leg. Reports pain to left tonsil area a 5 that radiates to left ear.   Pt denies dysphagia. Pt reports a regular unmodified diet orally. Oral exam reveals dry mouth, teeth pulled.  Pt reports Constipation, reports bowel movement every 3-4 days. Scheduled for feeding tube and port placed.  Wt Readings from Last 3 Encounters:  05/02/15 250 lb 4.8 oz (113.535 kg)  04/26/15 251 lb (113.853 kg)  04/18/15 251 lb 14.4 oz (114.261 kg)   BP 145/82 mmHg  Pulse 69  Temp(Src) 98.4 F (36.9 C) (Oral)  Resp 12  Wt 250 lb 4.8 oz (113.535 kg)  SpO2 98%

## 2015-05-02 NOTE — Progress Notes (Signed)
  Radiation Oncology         (336) [phone] ________________________________  Name: Shane Cantu MRN: [id number]  Date: 05/02/2015  DOB: March 16, 1956  SIMULATION AND TREATMENT PLANNING NOTE  Outpatient  DIAGNOSIS:     ICD-9-CM ICD-10-CM   1. Carcinoma of base of tongue 141.0 C01     NARRATIVE:  The patient was brought to the [facility].  Identity was confirmed.  All relevant records and images related to the planned course of therapy were reviewed.  The patient freely provided informed written consent to proceed with treatment after reviewing the details related to the planned course of therapy. The consent form was witnessed and verified by the simulation staff.    Then, the patient was set-up in a stable reproducible  supine position for radiation therapy. Aquaplast mask was made. CT images were obtained.  Surface markings were placed.  The CT images were loaded into the planning software.    TREATMENT PLANNING NOTE: Treatment planning then occurred.  The radiation prescription was entered and confirmed.    A total of 5 medically necessary complex treatment devices were fabricated and supervised by me (4 fields with MLCs to block cord, parotids, lungs, esophagus, and mask). I have requested : 3D Simulation  I have requested a DVH of the following structures: parotids, cord, esophagus, lungs, CTV.    The patient will receive 30 Gy in 10 fractions, To spine /rib and neck.   -----------------------------------  Eppie Gibson, MD

## 2015-05-03 ENCOUNTER — Telehealth: Payer: Self-pay | Admitting: *Deleted

## 2015-05-03 ENCOUNTER — Encounter (HOSPITAL_COMMUNITY): Payer: Self-pay | Admitting: Emergency Medicine

## 2015-05-03 ENCOUNTER — Emergency Department (HOSPITAL_COMMUNITY)
Admission: EM | Admit: 2015-05-03 | Discharge: 2015-05-04 | Disposition: A | Discharge status: Home / Self Care | Payer: PPO | Attending: Emergency Medicine | Admitting: Emergency Medicine

## 2015-05-03 DIAGNOSIS — Z79899 Other long term (current) drug therapy: Secondary | ICD-10-CM | POA: Insufficient documentation

## 2015-05-03 DIAGNOSIS — G43009 Migraine without aura, not intractable, without status migrainosus: Secondary | ICD-10-CM | POA: Diagnosis not present

## 2015-05-03 DIAGNOSIS — Z9889 Other specified postprocedural states: Secondary | ICD-10-CM | POA: Insufficient documentation

## 2015-05-03 DIAGNOSIS — M542 Cervicalgia: Secondary | ICD-10-CM | POA: Insufficient documentation

## 2015-05-03 DIAGNOSIS — I10 Essential (primary) hypertension: Secondary | ICD-10-CM | POA: Insufficient documentation

## 2015-05-03 DIAGNOSIS — G8929 Other chronic pain: Secondary | ICD-10-CM | POA: Insufficient documentation

## 2015-05-03 DIAGNOSIS — Z87442 Personal history of urinary calculi: Secondary | ICD-10-CM | POA: Insufficient documentation

## 2015-05-03 DIAGNOSIS — C029 Malignant neoplasm of tongue, unspecified: Secondary | ICD-10-CM | POA: Diagnosis not present

## 2015-05-03 DIAGNOSIS — R221 Localized swelling, mass and lump, neck: Secondary | ICD-10-CM | POA: Diagnosis not present

## 2015-05-03 DIAGNOSIS — M1990 Unspecified osteoarthritis, unspecified site: Secondary | ICD-10-CM | POA: Diagnosis not present

## 2015-05-03 DIAGNOSIS — G629 Polyneuropathy, unspecified: Secondary | ICD-10-CM | POA: Insufficient documentation

## 2015-05-03 DIAGNOSIS — K219 Gastro-esophageal reflux disease without esophagitis: Secondary | ICD-10-CM | POA: Insufficient documentation

## 2015-05-03 DIAGNOSIS — Z791 Long term (current) use of non-steroidal anti-inflammatories (NSAID): Secondary | ICD-10-CM | POA: Insufficient documentation

## 2015-05-03 DIAGNOSIS — Z7951 Long term (current) use of inhaled steroids: Secondary | ICD-10-CM | POA: Insufficient documentation

## 2015-05-03 LAB — CBC WITH DIFFERENTIAL/PLATELET
Basophils Absolute: 0.1 10*3/uL (ref 0.0–0.1)
Basophils Relative: 1 % (ref 0–1)
Eosinophils Absolute: 0.3 10*3/uL (ref 0.0–0.7)
Eosinophils Relative: 3 % (ref 0–5)
HCT: 38.8 % — ABNORMAL LOW (ref 39.0–52.0)
Hemoglobin: 13.1 g/dL (ref 13.0–17.0)
Lymphocytes Relative: 20 % (ref 12–46)
Lymphs Abs: 1.5 10*3/uL (ref 0.7–4.0)
MCH: 28.5 pg (ref 26.0–34.0)
MCHC: 33.8 g/dL (ref 30.0–36.0)
MCV: 84.3 fL (ref 78.0–100.0)
Monocytes Absolute: 0.9 10*3/uL (ref 0.1–1.0)
Monocytes Relative: 12 % (ref 3–12)
Neutro Abs: 5.1 10*3/uL (ref 1.7–7.7)
Neutrophils Relative %: 64 % (ref 43–77)
PLATELETS: 292 10*3/uL (ref 150–400)
RBC: 4.6 MIL/uL (ref 4.22–5.81)
RDW: 12.7 % (ref 11.5–15.5)
WBC: 7.9 10*3/uL (ref 4.0–10.5)

## 2015-05-03 MED ORDER — HYDROMORPHONE HCL 2 MG/ML IJ SOLN
2.0000 mg | Freq: Once | INTRAMUSCULAR | Status: AC
  Administered 2015-05-04: 2 mg via INTRAVENOUS
  Filled 2015-05-03: qty 1

## 2015-05-03 MED ORDER — ONDANSETRON HCL 4 MG/2ML IJ SOLN
4.0000 mg | Freq: Once | INTRAMUSCULAR | Status: AC
  Administered 2015-05-03: 4 mg via INTRAVENOUS
  Filled 2015-05-03: qty 2

## 2015-05-03 MED ORDER — HYDROMORPHONE HCL 1 MG/ML IJ SOLN
1.0000 mg | Freq: Once | INTRAMUSCULAR | Status: AC
  Administered 2015-05-03: 1 mg via INTRAVENOUS
  Filled 2015-05-03: qty 1

## 2015-05-03 NOTE — Telephone Encounter (Signed)
Oncology Nurse Navigator Documentation  Oncology Nurse Navigator Flowsheets 04/18/2015 04/20/2015 04/24/2015 04/27/2015 05/03/2015  Referral date to RadOnc/MedOnc 04/18/2015 - - - -  Navigator Encounter Type Initial MedOnc Telephone Telephone Telephone Telephone  Patient's dtr called with questions re: treatment plan following their attendance at yesterday's [facility]  I explained plan is for 10 radiation tmts to oropharynx, neck, C spine metastasis, and T2 metastasis with 3rd rib metastasis.  Chemotherapy is to follow RT.  She verbalized understanding of information provided.  Patient Visit Type Medonc - - - -  Barriers/Navigation Needs No barriers at this time - - - -  Interventions None required - - - -  Time Spent with Patient - 15 15 15 15    Gayleen Orem, RN, BSN, [facility] [phone]

## 2015-05-03 NOTE — ED Provider Notes (Signed)
CSN: [id number]     Arrival date & time 05/03/15  2256 History   First MD Initiated Contact with Patient 05/03/15 2322     Chief Complaint  Patient presents with  . 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 Patient reports that he has had increasing swelling of the left neck since 5:30 PM today. Per the patient's daughter, it is visibly more swollen than yesterday. Patient states that he now feels like he is having more difficulty swallowing and breathing.  He also reports increasing pain over the left neck and back. Denies any fevers.  Currently he rates his pain at 10/10.  Past Medical History  Diagnosis Date  . Chronic pain   . Back pain   . Chronic neck pain   . History of kidney stones   . Allergic rhinitis   . Hypertension   . Migraine headache without aura   . Sleep apnea     does not use cpap  . GERD (gastroesophageal reflux disease)   . Neuropathy   . Arthritis   . Cancer     tongue cancer   Past Surgical History  Procedure Laterality Date  . Back surgery  03/1993, 10/2002    Dr. Louanne Skye  . Tonsillectomy      as a child  . Neck surgery  2004    Dr. Louanne Skye  . Lymph node biopsy    . Nasal sinusotomy  1977  . Colonoscopy    . Multiple extractions with alveoloplasty N/A 04/26/2015    Procedure: Extraction of tooth #'s 6,11,12,15,20,21,22,27,28 with alveoloplasty;  Surgeon: [name], DDS;  Location: [facility];  Service: Oral Surgery;  Laterality: N/A;   Family History  Problem Relation Age of Onset  . Cancer Father     throat ca  . Cancer Brother     pituitary ca  . Heart disease Mother    History  Substance Use Topics  . Smoking status: Never Smoker   . Smokeless tobacco: Never Used  . Alcohol Use: Yes     Comment: once a month     Review of Systems  Constitutional: Negative.  Negative for fever.  HENT: Positive for trouble swallowing. Negative for voice change.   Respiratory: Negative.  Negative for chest tightness and shortness of breath.   Cardiovascular: Negative.  Negative for chest pain.  Gastrointestinal: Negative.  Negative for nausea, vomiting and abdominal pain.  Genitourinary: Negative.  Negative for dysuria.  Musculoskeletal: Positive for neck pain. Negative for back pain.  Skin: Negative for rash.  Neurological: Negative for headaches.  All other systems reviewed and are negative.     Allergies  Morphine and related  Home Medications   Prior to Admission medications   Medication Sig Start Date End Date Taking? Authorizing Provider  amLODipine (NORVASC) 5 MG tablet Take 5 mg by mouth daily.    Yes Historical Provider, MD  amoxicillin (AMOXIL) 500 MG capsule Take one capsule by mouth every 8 hours until all gone. 04/26/15  Yes [name], DDS  Armodafinil 250 MG tablet Take 250 mg by mouth daily.   Yes Historical Provider, MD  celecoxib (CELEBREX) 200 MG capsule Take 200 mg by mouth daily.   Yes Historical Provider, MD  diphenhydrAMINE (BENADRYL) 25 mg capsule Take 25 mg by mouth every 6 (  six) hours as needed for itching.   Yes Historical Provider, MD  gabapentin (NEURONTIN) 800 MG tablet Take 2,400 mg by mouth 2 (two) times daily.   Yes Historical Provider, MD  HYDROmorphone (DILAUDID) 2 MG tablet Take 1 tablet (2 mg total) by mouth every 6 (six) hours as needed for severe pain. 05/02/15  Yes [name], MD  ibuprofen (ADVIL,MOTRIN) 200 MG tablet Take 400 mg by mouth every 6 (six) hours as needed for mild pain or moderate pain.   Yes Historical Provider, MD  lansoprazole (PREVACID) 30 MG capsule Take 30 mg by mouth daily at 12 noon.   Yes Historical Provider, MD  loratadine-pseudoephedrine (CLARITIN-D 12 HOUR) 5-120 MG per tablet Take 1 tablet by mouth 2 (two) times daily. 04/26/15  Yes  [name], DDS  polyethylene glycol powder (GLYCOLAX/MIRALAX) powder Take 17 g by mouth 2 (two) times daily. 04/06/13  Yes Veryl Speak, MD  traZODone (DESYREL) 100 MG tablet Take 100 mg by mouth at bedtime.   Yes Historical Provider, MD  dexamethasone (DECADRON) 2 MG tablet Take 1 tablet (2 mg total) by mouth 2 (two) times daily. 05/02/15   Ni Gorsuch, MD   BP 125/89 mmHg  Pulse 58  Temp(Src) 98.9 F (37.2 C) (Oral)  Resp 17  SpO2 94% Physical Exam  Constitutional: He is oriented to person, place, and time. He appears well-developed and well-nourished.  HENT:   oropharynx clear and moist, uvula midline, no trismus  Eyes: Pupils are equal, round, and reactive to light.  Neck:  Diffuse swelling over the left neck including a large mass approximately 4 cm over the left anterior neck with swelling extending posteriorly and inferior to the ear, no overlying skin changes,  Cardiovascular: Normal rate, regular rhythm and normal heart sounds.   No murmur heard. Pulmonary/Chest: Effort normal and breath sounds normal. No stridor. No respiratory distress. He has no wheezes.  Musculoskeletal: He exhibits no edema.  Neurological: He is alert and oriented to person, place, and time.  Skin: Skin is warm and dry.  Psychiatric: He has a normal mood and affect.  Nursing note and vitals reviewed.   ED Course  Procedures (including critical care time) Labs Review Labs Reviewed  CBC WITH DIFFERENTIAL/PLATELET - Abnormal; Notable for the following:    HCT 38.8 (*)    All other components within normal limits  BASIC METABOLIC PANEL - Abnormal; Notable for the following:    Glucose, Bld 113 (*)    All other components within normal limits    Imaging Review Ct Soft Tissue Neck W Contrast  05/04/2015   CLINICAL DATA:  Initial evaluation for left-sided neck swelling. New diagnosis of tongue cancer.  EXAM: CT NECK WITH CONTRAST  TECHNIQUE: Multidetector CT imaging of the neck was performed using  the standard protocol following the bolus administration of intravenous contrast.  CONTRAST:  146mL OMNIPAQUE IOHEXOL 300 MG/ML  SOLN  COMPARISON:  Prior study from 04/10/2015.  FINDINGS: Pharynx and larynx: Previously identified enhancing left base of tongue mass measures 2.6 x 2.4 x 2.6 cm (transverse by AP by craniocaudad). This lesion abuts the midline medially without definite crossing of the midline. Regional invasion to the left base of tongue musculature.  [place]ine tonsils within normal limits. No retropharyngeal fluid collection. Supraglottic larynx and hypopharynx are unremarkable. True vocal cords symmetric and normal in appearance. Subglottic airway clear.  Salivary glands: Parotid glands and salivary glands are within normal limits.  Thyroid: Subcentimeter hypodense nodule within the right lobe  of thyroid, of doubtful clinical significance. Thyroid otherwise unremarkable.  Lymph nodes: Its a lateral level 1 B adenopathy measures up to 15 mm in short axis. Right level 2 nodal conglomerate measures approximately 3.0 x 3.8 x 3.9 cm. Again, there is suggestion of extranodal spread of disease at right level 2. Right level 3 lymph node just deep to the sternocleidomastoid muscle measure up to 12 mm in short axis. Right level 4 node measures 7 mm in short axis. Scattered level 5 nodes measure up to 1 cm in short axis. Right level 2/3 node measuring approximately 1 cm again seen. There is mildly increased swelling with inflammatory stranding within the left neck, which may related to treatment effect.  Vascular: Normal intravascular enhancement seen within the neck.  Limited intracranial: Visualized portions of the brain are unremarkable.  Visualized orbits: Partially visualized orbits within normal limits.  Mastoids and visualized paranasal sinuses: No mastoid effusion. Finding suggestive of chronic right maxillary sinus disease. Visualized paranasal sinuses are otherwise clear.  Skeleton: 1 cm lucency within  the base of the C2 vertebral body again seen, suspicious for osseous metastasis. Additional probable metastatic lesions within the C3 and T2 vertebral body. Findings again concerning for associated early pathologic compression deformity of T2. Probable destructive lesion within the posterior left fourth rib.  Upper chest: No acute abnormality within the visualized lungs.  IMPRESSION: 1. 2.6 x 2.4 x 2.6 cm left base of tongue mass with regional adenopathy, including extracapsular level 2 nodal spread, similar relative to recent CT from 04/10/2015. Mildly increased swelling with inflammatory stranding within the left favored to reflect treatment effect/postradiation changes. 2. Lucent lesions involving the C2, C3 and T2 vertebral bodies as well as the posterior left fourth rib, again suspicious for osseous metastatic disease. Probable early pathologic compression deformity at T2.   Electronically Signed   By: Jeannine Boga M.D.   On: 05/04/2015 02:10     EKG Interpretation None      MDM   Final diagnoses:  Tongue cancer  Neck swelling    Patient with history of cancer of the tongue present with worsening left neck swelling. Nontoxic on exam. ABCs intact. No evidence of stridor. There is diffuse swelling and mass over the left neck. Patient given pain medication and basic labwork obtained. CT scan soft tissue neck obtained. CT with mild increased swelling with inflammatory stranding. No significant airway compromise. Patient also has changes of the spine suggestive of metastatic disease. Patient given 2 mg of IV Decadron. On recheck, airway continues to be intact and no signs of stridor. Patient reports that he feels much better. Discussed findings with the patient. He is to call his oncologist later today to discuss whether he should be on steroids daily.  After history, exam, and medical workup I feel the patient has been appropriately medically screened and is safe for discharge home.  Pertinent diagnoses were discussed with the patient. Patient was given return precautions.     Merryl Hacker, MD 05/04/15 [phone]

## 2015-05-03 NOTE — ED Notes (Signed)
Pt has cancer in his neck and tongue  Pt states he started having swelling in the left side of his neck yesterday that has gotten worse today  Pt states he now feels like it is starting to affect his airway and it is starting to feel hard to swallow   Pt states his neck is very painful

## 2015-05-03 NOTE — ED Notes (Signed)
Administered pain medications as ordered, patient states he takes Dilaudid at home for pain-states 10/10 pain left lateral neck, no difficulty breathing at this time, states difficulty swallowing.  Patient is restless in bed, daughter at bedside.

## 2015-05-03 NOTE — Progress Notes (Signed)
Head & Neck Multidisciplinary Clinic Clinical Social Work  Clinical Social Work met with patient/family at head & neck multidisciplinary clinic to offer support and assess for psychosocial needs.  Shane Cantu was accompanied by his daughter whom he identifies as his main support.  Patient and daughter requested to review and complete healthcare advance directives today.  Clinical Social Worker met with patient and daughter in [place] office.  The patient designated daughter Shane Cantu as their primary healthcare agent and nephew Shane Cantu as their secondary agent.  Patient also completed healthcare living will.    Clinical Social Worker notarized documents and made copies for patient/family. Clinical Social Worker will send documents to medical records to be scanned into patient's chart. Clinical Social Worker encouraged patient/family to contact with any additional questions or concerns.   Clinical Social Work briefly discussed Clinical Social Work role and [employer] support programs/services.  Clinical Social Work encouraged patient to call with any additional questions or concerns.   Polo Riley, MSW, LCSW, OSW-C Clinical Social Worker [facility] [phone]

## 2015-05-04 ENCOUNTER — Telehealth: Payer: Self-pay | Admitting: *Deleted

## 2015-05-04 ENCOUNTER — Encounter (HOSPITAL_COMMUNITY): Payer: Self-pay

## 2015-05-04 ENCOUNTER — Emergency Department (HOSPITAL_COMMUNITY): Payer: PPO

## 2015-05-04 DIAGNOSIS — C01 Malignant neoplasm of base of tongue: Secondary | ICD-10-CM | POA: Diagnosis not present

## 2015-05-04 LAB — BASIC METABOLIC PANEL
ANION GAP: 10 (ref 5–15)
BUN: 20 mg/dL (ref 6–20)
CO2: 23 mmol/L (ref 22–32)
Calcium: 9.1 mg/dL (ref 8.9–10.3)
Chloride: 103 mmol/L (ref 101–111)
Creatinine, Ser: 1.11 mg/dL (ref 0.61–1.24)
GFR calc Af Amer: >60 mL/min (ref >60)
GFR calc non Af Amer: >60 mL/min (ref >60)
Glucose, Bld: 113 mg/dL — ABNORMAL HIGH (ref 65–99)
POTASSIUM: 4.2 mmol/L (ref 3.5–5.1)
Sodium: 136 mmol/L (ref 135–145)

## 2015-05-04 MED ORDER — LORAZEPAM 2 MG/ML IJ SOLN
1.0000 mg | Freq: Once | INTRAMUSCULAR | Status: AC
  Administered 2015-05-04: 1 mg via INTRAVENOUS
  Filled 2015-05-04: qty 1

## 2015-05-04 MED ORDER — IOHEXOL 300 MG/ML  SOLN
100.0000 mL | Freq: Once | INTRAMUSCULAR | Status: AC | PRN
  Administered 2015-05-04: 100 mL via INTRAVENOUS

## 2015-05-04 MED ORDER — DEXAMETHASONE SODIUM PHOSPHATE 10 MG/ML IJ SOLN
10.0000 mg | Freq: Once | INTRAMUSCULAR | Status: AC
  Administered 2015-05-04: 10 mg via INTRAVENOUS
  Filled 2015-05-04: qty 1

## 2015-05-04 NOTE — Progress Notes (Signed)
Oncology Nurse Navigator Documentation  Oncology Nurse Navigator Flowsheets 05/02/2015  Referral date to RadOnc/MedOnc -  Navigator Encounter Type Clinic/MDC  Facilitated patient's appts with [facility] practioners.  Provided support to him and dtr following MD reporting of metastatic disease, prognosis.  Facilitated CT/SIM following MDC.  Patient understands clearance from Dr. Enrique Sack needed before RT can begin; patient acknowledged he has appt with him on Monday at 0800.  They understand I can be contacted with needs/concerns.  Patient Visit Type -  Barriers/Navigation Needs -  Interventions -  Time Spent with Patient Uncertain, RN, BSN, [place] at [place] [phone]

## 2015-05-04 NOTE — Discharge Instructions (Signed)
You were seen today for neck swelling. Your CT scan is mostly unchanged with only a small amount of increased swelling. There is no airway compromise. You were given steroids. You need to follow-up with your oncologist later today in regards to further thyroid.   Cancer of the Tongue Cancer of the tongue occurs when a group of cells on the tongue become abnormal and start to grow out of control. Most of the time, tongue cancer starts in very thin, flat cells that cover the surface of your tongue (squamous cells). Cancer cells can spread and form a mass of cells called a tumor. The tumor may spread deeper into the tongue, or it may spread to other areas of the body (metastasize). RISK FACTORS The exact cause of cancer of the tongue is not known. However, some risk factors make this more likely:  Use of tobacco products, including cigarettes, pipes, cigars, smokeless (chewing) tobacco, and snuff. This is the number one risk factor of cancer of the tongue.  Male gender.  Age of 58 years or older.  Poor oral hygiene (not brushing or flossing your teeth regularly).  Frequent use of alcohol.  Human papillomavirus (HPV) infection.  Family history of tongue cancer. SYMPTOMS Tongue cancer can start in 1 of 2 places. It can start at the front part of the tongue or at the base of the tongue at the back of your throat.Symptoms of cancer at the front part of your tongue may include:  A lump or sore on your tongue that may be painful (especially when you eat or speak) and may not heal. It also may bleed easily if you bite it or touch it.  Numbness on your tongue.  Difficulty moving your tongue.  Pain when chewing.  Difficulty pronouncing or saying certain words or making certain sounds.  A bad odor in your mouth.  A lump on your neck. Cancer at the base of the tongue is harder to see. Symptoms may not show up as soon as they do for  cancer at the front part of your tongue. Symptoms may include:  A feeling in your throat that you are choking (especially when you are lying down).  Difficulty swallowing or pain when you swallow.  A muffled voice.  Ear pain.  Pain when you try to open your mouth or difficulty opening your mouth.  A lump on your neck. DIAGNOSIS To diagnose tongue cancer, your caregiver may perform the following exams:  A physical exam of your mouth, throat, and neck for a sore or lump. Your caregiver may use a mirror with a long handle or a thin, flexible tube with a tiny light and camera at the end (fiberscope) to see the back of your mouth.  Removal and exam of a small number of cells (biopsy) from your tongue or a lump on your neck. The cells are checked for cancerous formations under a microscope.  Imaging exams, such as X-rays of your mouth and neck. The images can show if there is an abnormal mass. If you do have cancer, your caregiver will stage your cancer. Staging provides an idea of how advanced your cancer is. The stage will depend on how much your cancer has grown and if it has metastasized. The meaning of the stage depends on the type of cancer. For tongue cancer:  Stage I means the cancer is the size of a peanut or smaller. It has not metastasized.  Stage II means the cancer is larger than a peanut,  but not larger than a walnut. It has not metastasized.  Stage III means the cancer has grown larger than a walnut. It may have spread to a lymph node or lymph gland on the same side of your neck as the cancer. (Lymph is a fluid that carries white blood cells all over your body. White blood cells fight infection.)  Stage IV means the cancer has spread to nearby areas. It may have spread heavily into your lymph glands. TREATMENT Treatment for tongue cancer can vary. It will depend on the stage and location of your cancer and your overall health. Treatment options may include:  Radiation  therapy. This uses waves of nuclear energy to kill cancer cells on your tongue. It may be used for stage I and II cancers. It often is used for cancers at the base of your tongue.  Surgery:  Surgery is done if your tumor is small, has not spread, and is at the front of your tongue.  Surgery may be done to remove tumors that have spread into your neck or lymph nodes.  A combination of surgery, radiation, and drugs that kills cancer cells (chemotherapy). This may be done for stage III and stage IV cancers. SEEK MEDICAL CARE IF:  Your tongue hurts or is numb.  The [place] you speak changes.  The [place] you swallow changes.  You notice a lump on your neck.  You have an oral temperature above 100.5 F (38.1 C). SEEK IMMEDIATE MEDICAL CARE IF:   You have pain that gets worse.  Your tongue or mouth bleeds.  Your lip, mouth, or neck swells.  You have trouble swallowing.  You have trouble breathing.  You have an oral temperature above 102 F (38.9 C). Document Released: 03/04/2011 Document Revised: 02/09/2012 Document Reviewed: 03/04/2011 [facility] Patient Information 2015 [place], [place]. This information is not intended to replace advice given to you by your health care provider. Make sure you discuss any questions you have with your health care provider.

## 2015-05-04 NOTE — ED Notes (Signed)
Patient is more comfortable in bed after IV Ativan, less restlessness

## 2015-05-04 NOTE — ED Notes (Signed)
Patient transported to CT 

## 2015-05-04 NOTE — Telephone Encounter (Signed)
Oncology Nurse Navigator Documentation  Oncology Nurse Navigator Flowsheets 05/04/2015 05/04/2015 05/04/2015  Referral date to RadOnc/MedOnc - - -  Navigator Encounter Type Telephone Telephone Telephone  Patient called with response to my earlier inquiry about his taking decadron.  He stated he had not filled Rx when he left on Wed, thought it was for nausea.  He indicated he will fill Rx at his local pharmacy, along with the new dilaudid Rx received Wed.  I explained it will be 2-3 days before decadron is maximally effective.   He verbalized understanding.  He stated he took dilaudid 2 mg at 0800, pain still 5-6/10.  I guided him to alternate dilaudid with ibuprofen for additional relief.  He verbalized understanding.  I encouraged him to call for the [facility] on-call this weekend if he needs assistance, provided him [phone].  He verbalized understanding.  He asked about obtaining second opinion re: his diagnosis and prognosis.  I encouraged him to do so if that would bring him peace of mind.  He indicated his brother as a result of 2nd opinion enrolled in clinical trial at [facility] and ultimately sacrificed quality of life for additional time.  Sanay said he doesn't want that for himself.  He noted he is a positive person with a deep faith and he is receiving lots of support from his church and friends.  I offered [place] and chaplain support for him and/or his dtr.     Patient Visit Type - - -  Barriers/Navigation Needs - - -  Interventions - - -  Time Spent with Patient 15 15 30    Gayleen Orem, RN, BSN, [place] at [place] [phone]

## 2015-05-04 NOTE — ED Notes (Signed)
Pt O2 sat on room air 76%; O2 a 4lpm [place] placed on patient; after a couple of minutes O2 sats up to 96%

## 2015-05-04 NOTE — Telephone Encounter (Signed)
Nope, Shane Cantu. He was seen Wednesday, seen in ER Thursday with no change in CT and I AM NOT going to be able to see him today He needs to take his pain medicine as instructed and start dex as instructed. I am not sure when he started the dex. It takes 2-3 days for it to work. He can potential increase dex to 4 times a day to reduce swelling

## 2015-05-04 NOTE — ED Notes (Signed)
Dr. Dina Rich made aware patient continues to have 9/10 pain left lateral neck back to posterior neck.  Patient remains very restless in bed, administered Ativan 1 mg IV as ordered.  MP SR-SB with rate 50-60, BP stable

## 2015-05-04 NOTE — Telephone Encounter (Signed)
Oncology Nurse Navigator Documentation  Oncology Nurse Navigator Flowsheets 05/04/2015  Referral date to RadOnc/MedOnc -  Navigator Encounter Type Telephone  Patient returned my VM.  I recapped ER visit and outcome, he confirmed.  He stated his current pain level is 5/10, I encouraged him to take dilaudid 2 mg to minimize increase in pain, he agreed.  I indicated appt to see [name] being arranged, he would be notified.  He verbalized understanding.  Patient Visit Type -  Barriers/Navigation Needs -  Interventions -  Time Spent with Patient 15

## 2015-05-04 NOTE — Telephone Encounter (Signed)
Oncology Nurse Navigator Documentation  Oncology Nurse Navigator Flowsheets 05/04/2015  Referral date to RadOnc/MedOnc   Navigator Encounter Type Telephone  Returned patient's [phone] VM in which he indicated he visited [facility] ER last HS for severe neck pain, increased L neck swelling.  LVM asking him to call me.  Spoke with dtr who reported same, stated he was also having difficulty swallowing d/t dry mouth.  ER notes indicate airway not compromised, patient received IV dilaudid and ativan, pain and restlessness brought under control.  Per dtr, he was advised to contact oncologist in the morning, I notified [name].     Patient Visit Type -  Barriers/Navigation Needs -  Interventions -  Time Spent with Patient [place], RN, BSN, [place] at [place] [phone]

## 2015-05-04 NOTE — ED Notes (Signed)
Patient continues to complain of 10/10 left lateral neck pain, Dr. Dina Rich made aware and Dilaudid 2 mg IV given, patient is restless in bed.

## 2015-05-07 ENCOUNTER — Telehealth: Payer: Self-pay | Admitting: *Deleted

## 2015-05-07 ENCOUNTER — Ambulatory Visit: Payer: Self-pay | Admitting: Hematology and Oncology

## 2015-05-07 ENCOUNTER — Encounter: Payer: Self-pay | Admitting: *Deleted

## 2015-05-07 ENCOUNTER — Ambulatory Visit (HOSPITAL_COMMUNITY): Payer: Self-pay | Admitting: Dentistry

## 2015-05-07 ENCOUNTER — Telehealth: Payer: Self-pay | Admitting: Hematology and Oncology

## 2015-05-07 ENCOUNTER — Encounter: Payer: Self-pay | Admitting: Radiation Oncology

## 2015-05-07 ENCOUNTER — Encounter (HOSPITAL_COMMUNITY): Payer: Self-pay | Admitting: Dentistry

## 2015-05-07 VITALS — BP 148/73 | HR 54 | Temp 98.6°F

## 2015-05-07 DIAGNOSIS — C01 Malignant neoplasm of base of tongue: Secondary | ICD-10-CM

## 2015-05-07 DIAGNOSIS — K08109 Complete loss of teeth, unspecified cause, unspecified class: Secondary | ICD-10-CM

## 2015-05-07 DIAGNOSIS — Z01818 Encounter for other preprocedural examination: Secondary | ICD-10-CM

## 2015-05-07 DIAGNOSIS — K0820 Unspecified atrophy of edentulous alveolar ridge: Secondary | ICD-10-CM

## 2015-05-07 NOTE — Telephone Encounter (Signed)
Oncology Nurse Navigator Documentation  Oncology Nurse Navigator Flowsheets 05/07/2015  Referral date to RadOnc/MedOnc -  Navigator Encounter Type Telephone  Patient called to report unsatisfactorily controlled neck pain.  He stated neck swelling has not resolved since beginning Decadron BID on Friday.  He is taking Dilaudid 2 mg q6 hr as prescribed for pain rated 8/10, resolves to 2-3/10 for about 2-3 hrs before increasing again.  He is taking ibuprofen 200 mg q6 hr.  He is applying ice packs which provide minimal pain relief.  [name] notified.  Patient is seeing Dr. Enrique Sack this morning @ 0800.  I advised him to contact me at [facility] after this appt.  He verbalized understanding.    Patient Visit Type -  Barriers/Navigation Needs -  Interventions -  Time Spent with Patient [place], RN, BSN, [place] at [place] [phone]

## 2015-05-07 NOTE — Progress Notes (Signed)
POST OPERATIVE NOTE:  05/07/2015   Kwesi Sangha Tersigni [id number]  VITALS: BP 148/73 mmHg  Pulse 54  Temp(Src) 98.6 F (37 C) (Oral)  LABS:  Lab Results  Component Value Date   WBC 7.9 05/03/2015   HGB 13.1 05/03/2015   HCT 38.8* 05/03/2015   MCV 84.3 05/03/2015   PLT 292 05/03/2015   BMET    Component Value Date/Time   NA 136 05/03/2015 2344   K 4.2 05/03/2015 2344   CL 103 05/03/2015 2344   CO2 23 05/03/2015 2344   GLUCOSE 113* 05/03/2015 2344   BUN 20 05/03/2015 2344   CREATININE 1.11 05/03/2015 2344   CALCIUM 9.1 05/03/2015 2344   GFRNONAA >60 05/03/2015 2344   GFRAA >60 05/03/2015 2344    Lab Results  Component Value Date   INR 1.12 04/13/2015   No results found for: PTT   Corgan Mormile Levick is status post extraction of remaining teeth with alveoloplasty in the operating room on 04/26/2015. The patient now presents for evaluation of healing and suture removal as needed.  SUBJECTIVE: Patient without dental pain or oral complaints. Patient denies having any sinus problems. Patient has completed all his antibiotic therapy.   EXAM: There is no sign of infection, heme, or ooze. Sutures are all gone. Patient is healing in by generalized primary closure. Extraction site area #15 is healing in by secondary intention. Valsalva maneuver was negative for evaluation of sinus involvement of the upper left molar. 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  PROCEDURE: The patient was given a chlorhexidine gluconate rinse for 30 seconds. No sutures were present.  ASSESSMENT: Post operative course is consistent with dental procedures performed in the OR. Patient is edentulous.  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  PLAN: 1. Continue salt water rinses as needed to aid healing. 2. Advance diet as tolerated but maintain a soft diet for additional 2 weeks. 3. Return to clinic in 2-3 weeks for oral examination during radiation  therapy. 4. Patient is cleared for start of radiation therapy on 05/18/2015 at the earliest. 5. Discuss timing of denture fabrication in light of less than full course of radiation therapy. 6. Call if problems arise    [name], DDS

## 2015-05-07 NOTE — Patient Instructions (Addendum)
PLAN: 1. Continue salt water rinses as needed to aid healing. 2. Advance diet as tolerated but maintain a soft diet for additional 2 weeks. 3. Return to clinic in 2-3 weeks for oral examination during radiation therapy. 4. Patient is cleared for start of radiation therapy on 05/18/2015 at the earliest. 5. Discuss timing of denture fabrication in light of less than full course of radiation therapy. 6. Call if problems arise    [name], DDS

## 2015-05-07 NOTE — Telephone Encounter (Signed)
s.w. pt and advised to todays appt

## 2015-05-08 ENCOUNTER — Telehealth: Payer: Self-pay | Admitting: *Deleted

## 2015-05-08 ENCOUNTER — Other Ambulatory Visit: Payer: Self-pay | Admitting: Radiology

## 2015-05-08 NOTE — Telephone Encounter (Signed)
Oncology Nurse Navigator Documentation  Oncology Nurse Navigator Flowsheets 05/08/2015  Referral date to RadOnc/MedOnc -  Navigator Encounter Type Telephone  Patient called to report intermittent increased swallowing difficulty beginning around 0300 last HS.    He denies airway compromise but experiences occasional SOB which is somewhat positional.    He has not been taking decadron q6 hrs as suggested yesterday, will begin doing so.   He reports that pain is satisfactorily controlled with increased dilaudid dosage (4 mg q6h) as directed yesterday.    He verbalized understanding of PAC placement 0830 tomorrow morning, 1330 f/u appt with [name].    He indicated he does not feel situation is emergent, he can wait until tomorrow to see [name].    He understands that he begins RT this Thursday @ 1330.  I asked him to call me this afternoon with an update.  Patient Visit Type -  Barriers/Navigation Needs -  Interventions -  Time Spent with Patient 15

## 2015-05-08 NOTE — Progress Notes (Signed)
Oncology Nurse Navigator Documentation  Oncology Nurse Navigator Flowsheets 05/07/2015  Referral date to RadOnc/MedOnc -  Navigator Encounter Type Other  Following f/u with Alvy Bimler, met patient in [facility] lobby s/p his 0800 appt with Dr. Tommie Raymond, Dental Medicine.  Per her guidance, I instructed patient to: 1.  Increase dilaudid to 4 mg q6h PRN for pain. 2.  Increase frequency of decadron to q6h daily. I noted these adjustments on AVS medication list he had from appt with Dr. Enrique Sack.  He verbalized understanding of instructions.  Patient Visit Type -  Barriers/Navigation Needs -  Interventions -  Time Spent with Patient [place], RN, BSN, [place] at [place] [phone]

## 2015-05-08 NOTE — Telephone Encounter (Signed)
Oncology Nurse Navigator Documentation  Oncology Nurse Navigator Flowsheets 05/08/2015  Referral date to RadOnc/MedOnc -  Navigator Encounter Type Telephone  Patient's dtr Amy called to express concern about her dad's increasing anxiety r/t increased swelling of neck mass with related swallowing difficulty and pain.    I reviewed with her Dr. Calton Dach guidance to increase dilaudid dosage and decadron frequency that I communicated to [facility] yesterday morning.    She noted she thinks his anxiety will diminish somewhat when he begins RT on Thursday.    She indicated she will be joining him for tomorrow's appt with [name].  Patient Visit Type -  Barriers/Navigation Needs -  Interventions -  Time Spent with Patient [place], RN, BSN, [place] at [place] [phone]

## 2015-05-09 ENCOUNTER — Ambulatory Visit (HOSPITAL_COMMUNITY)
Admission: RE | Admit: 2015-05-09 | Discharge: 2015-05-09 | Disposition: A | Discharge status: Home / Self Care | Payer: PPO | Source: Ambulatory Visit | Attending: Hematology and Oncology | Admitting: Hematology and Oncology

## 2015-05-09 ENCOUNTER — Encounter: Payer: Self-pay | Admitting: *Deleted

## 2015-05-09 ENCOUNTER — Other Ambulatory Visit: Payer: Self-pay | Admitting: Hematology and Oncology

## 2015-05-09 ENCOUNTER — Telehealth: Payer: Self-pay | Admitting: Hematology and Oncology

## 2015-05-09 ENCOUNTER — Ambulatory Visit (HOSPITAL_BASED_OUTPATIENT_CLINIC_OR_DEPARTMENT_OTHER): Payer: PPO | Admitting: Hematology and Oncology

## 2015-05-09 ENCOUNTER — Encounter: Payer: Self-pay | Admitting: Hematology and Oncology

## 2015-05-09 ENCOUNTER — Encounter (HOSPITAL_COMMUNITY): Payer: Self-pay

## 2015-05-09 VITALS — BP 143/80 | HR 62 | Temp 98.2°F | Resp 19 | Ht 69.0 in | Wt 251.8 lb

## 2015-05-09 DIAGNOSIS — G8929 Other chronic pain: Secondary | ICD-10-CM

## 2015-05-09 DIAGNOSIS — C029 Malignant neoplasm of tongue, unspecified: Secondary | ICD-10-CM

## 2015-05-09 DIAGNOSIS — R221 Localized swelling, mass and lump, neck: Secondary | ICD-10-CM | POA: Diagnosis not present

## 2015-05-09 DIAGNOSIS — M542 Cervicalgia: Secondary | ICD-10-CM | POA: Diagnosis not present

## 2015-05-09 LAB — CBC WITH DIFFERENTIAL/PLATELET
Basophils Absolute: 0 10*3/uL (ref 0.0–0.1)
Basophils Relative: 0 % (ref 0–1)
EOS ABS: 0 10*3/uL (ref 0.0–0.7)
EOS PCT: 0 % (ref 0–5)
HCT: 38.8 % — ABNORMAL LOW (ref 39.0–52.0)
Hemoglobin: 13 g/dL (ref 13.0–17.0)
Lymphocytes Relative: 9 % — ABNORMAL LOW (ref 12–46)
Lymphs Abs: 0.9 10*3/uL (ref 0.7–4.0)
MCH: 28.5 pg (ref 26.0–34.0)
MCHC: 33.5 g/dL (ref 30.0–36.0)
MCV: 85.1 fL (ref 78.0–100.0)
MONO ABS: 0.8 10*3/uL (ref 0.1–1.0)
Monocytes Relative: 8 % (ref 3–12)
NEUTROS ABS: 8.2 10*3/uL — AB (ref 1.7–7.7)
NEUTROS PCT: 83 % — AB (ref 43–77)
Platelets: 287 10*3/uL (ref 150–400)
RBC: 4.56 MIL/uL (ref 4.22–5.81)
RDW: 12.9 % (ref 11.5–15.5)
WBC: 10 10*3/uL (ref 4.0–10.5)

## 2015-05-09 LAB — PROTIME-INR
INR: 1.1 (ref 0.00–1.49)
PROTHROMBIN TIME: 14.4 s (ref 11.6–15.2)

## 2015-05-09 MED ORDER — LIDOCAINE-EPINEPHRINE 2 %-1:100000 IJ SOLN
INTRAMUSCULAR | Status: AC
  Filled 2015-05-09: qty 1

## 2015-05-09 MED ORDER — HEPARIN SOD (PORK) LOCK FLUSH 100 UNIT/ML IV SOLN
INTRAVENOUS | Status: AC | PRN
  Administered 2015-05-09: 500 [IU]

## 2015-05-09 MED ORDER — FENTANYL CITRATE (PF) 100 MCG/2ML IJ SOLN
INTRAMUSCULAR | Status: AC
  Filled 2015-05-09: qty 4

## 2015-05-09 MED ORDER — HYDROMORPHONE HCL 4 MG PO TABS: 4.0000 mg | ORAL_TABLET | ORAL | Status: DC | PRN

## 2015-05-09 MED ORDER — CEFAZOLIN SODIUM-DEXTROSE 2-3 GM-% IV SOLR
INTRAVENOUS | Status: AC
  Filled 2015-05-09: qty 50

## 2015-05-09 MED ORDER — LIDOCAINE HCL 1 % IJ SOLN
INTRAMUSCULAR | Status: AC
  Filled 2015-05-09: qty 20

## 2015-05-09 MED ORDER — CEFAZOLIN SODIUM-DEXTROSE 2-3 GM-% IV SOLR
2.0000 g | Freq: Once | INTRAVENOUS | Status: AC
  Administered 2015-05-09: 2 g via INTRAVENOUS

## 2015-05-09 MED ORDER — FENTANYL CITRATE (PF) 100 MCG/2ML IJ SOLN
INTRAMUSCULAR | Status: AC | PRN
  Administered 2015-05-09: 25 ug via INTRAVENOUS
  Administered 2015-05-09: 50 ug via INTRAVENOUS

## 2015-05-09 MED ORDER — MIDAZOLAM HCL 2 MG/2ML IJ SOLN
INTRAMUSCULAR | Status: AC
  Filled 2015-05-09: qty 6

## 2015-05-09 MED ORDER — SODIUM CHLORIDE 0.9 % IV SOLN
INTRAVENOUS | Status: DC
  Administered 2015-05-09: 09:00:00 via INTRAVENOUS

## 2015-05-09 MED ORDER — LIDOCAINE-PRILOCAINE 2.5-2.5 % EX CREA: 1.0000 "application " | TOPICAL_CREAM | CUTANEOUS | Status: DC | PRN

## 2015-05-09 MED ORDER — HEPARIN SOD (PORK) LOCK FLUSH 100 UNIT/ML IV SOLN
INTRAVENOUS | Status: AC
  Filled 2015-05-09: qty 5

## 2015-05-09 MED ORDER — MIDAZOLAM HCL 2 MG/2ML IJ SOLN
INTRAMUSCULAR | Status: AC | PRN
  Administered 2015-05-09: 0.5 mg via INTRAVENOUS
  Administered 2015-05-09: 1 mg via INTRAVENOUS

## 2015-05-09 NOTE — Telephone Encounter (Signed)
Pt confirmed MD visit per 06/08 POF, gave pt AVS and Calendar..... KJ

## 2015-05-09 NOTE — Progress Notes (Signed)
Oncology Nurse Navigator Documentation  Oncology Nurse Navigator Flowsheets 05/09/2015  Referral date to RadOnc/MedOnc -  Navigator Encounter Type Other  Patient Visit Type Follow-up  To provide support and encouragement, care continuity, met with patient during f/u appt with [name].  [name] accompanied him.  He understands:  Chemotherapy will start after RT is completed.  Radiation starts tomorrow, he will receive 10 tmts.  Dr. Calton Dach guidance to increase frequency of PRN 4 mg dialudid from q6 to q4 hrs. We discussed importance of regular BMs while taking pain medication.  He stated he is taking both OTC Miralax and stool softener BID.  Presently BMs are daily. I encouraged dtr to avail herself to [facility] support services in response to her comment that her dad's increasing anxiety is becoming more and more difficult for her to handle.  They understand I will join them for first RT tomorrow.    Barriers/Navigation Needs -  Interventions -  Time Spent with Patient 30

## 2015-05-09 NOTE — Discharge Instructions (Signed)
Implanted [facility] Guide An implanted port is a type of central line that is placed under the skin. Central lines are used to provide IV access when treatment or nutrition needs to be given through a person's veins. Implanted porImplanted Port Insertion, Care After Refer to this sheet in the next few weeks. These instructions provide you with information on caring for yourself after your procedure. Your health care provider may also give you more specific instructions. Your treatment has been planned according to current medical practices, but problems sometimes occur. Call your health care provider if you have any problems or questions after your procedure. WHAT TO EXPECT AFTER THE PROCEDURE After your procedure, it is typical to have the following:   Discomfort at the port insertion site. Ice packs to the area will help.  Bruising on the skin over the port. This will subside in 3-4 days. HOME CARE INSTRUCTIONS  After your port is placed, you will get a manufacturer's information card. The card has information about your port. Keep this card with you at all times.   Know what kind of port you have. There are many types of ports available.   Wear a medical alert bracelet in case of an emergency. This can help alert health care workers that you have a port.   The port can stay in for as long as your health care provider believes it is necessary.   A home health care nurse may give medicines and take care of the port.   You or a family member can get special training and directions for giving medicine and taking care of the port at home.  SEEK MEDICAL CARE IF:   Your port does not flush or you are unable to get a blood return.   You have a fever or chills. SEEK IMMEDIATE MEDICAL CARE IF:  You have new fluid or pus coming from your incision.   You notice a bad smell coming from your incision site.   You have swelling, pain, or more redness at the incision or port site.    You have chest pain or shortness of breath. Document Released: 09/07/2013 Document Revised: 11/22/2013 Document Reviewed: 09/07/2013 [facility] Patient Information 2015 [place], [place]. This information is not intended to replace advice given to you by your health care provider. Make sure you discuss any questions you have with your health care provider. ts are used for long-term IV access. An implanted port may be placed because:   You need IV medicine that would be irritating to the small veins in your hands or arms.   You need long-term IV medicines, such as antibiotics.   You need IV nutrition for a long period.   You need frequent blood draws for lab tests.   You need dialysis.  Implanted ports are usually placed in the chest area, but they can also be placed in the upper arm, the abdomen, or the leg. An implanted port has two main parts:   Reservoir. The reservoir is round and will appear as a small, raised area under your skin. The reservoir is the part where a needle is inserted to give medicines or draw blood.   Catheter. The catheter is a thin, flexible tube that extends from the reservoir. The catheter is placed into a large vein. Medicine that is inserted into the reservoir goes into the catheter and then into the vein.  HOW WILL I CARE FOR MY INCISION SITE? Do not get the incision site wet. Bathe or  shower as directed by your health care provider.  HOW IS MY PORT ACCESSED? Special steps must be taken to access the port:   Before the port is accessed, a numbing cream can be placed on the skin. This helps numb the skin over the port site.   Your health care provider uses a sterile technique to access the port.  Your health care provider must put on a mask and sterile gloves.  The skin over your port is cleaned carefully with an antiseptic and allowed to dry.  The port is gently pinched between sterile gloves, and a needle is inserted into the port.  Only  "non-coring" port needles should be used to access the port. Once the port is accessed, a blood return should be checked. This helps ensure that the port is in the vein and is not clogged.   If your port needs to remain accessed for a constant infusion, a clear (transparent) bandage will be placed over the needle site. The bandage and needle will need to be changed every week, or as directed by your health care provider.   Keep the bandage covering the needle clean and dry. Do not get it wet. Follow your health care provider's instructions on how to take a shower or bath while the port is accessed.   If your port does not need to stay accessed, no bandage is needed over the port.  WHAT IS FLUSHING? Flushing helps keep the port from getting clogged. Follow your health care provider's instructions on how and when to flush the port. Ports are usually flushed with saline solution or a medicine called heparin. The need for flushing will depend on how the port is used.   If the port is used for intermittent medicines or blood draws, the port will need to be flushed:   After medicines have been given.   After blood has been drawn.   As part of routine maintenance.   If a constant infusion is running, the port may not need to be flushed.  HOW LONG WILL MY PORT STAY IMPLANTED? The port can stay in for as long as your health care provider thinks it is needed. When it is time for the port to come out, surgery will be done to remove it. The procedure is similar to the one performed when the port was put in.  WHEN SHOULD I SEEK IMMEDIATE MEDICAL CARE? When you have an implanted port, you should seek immediate medical care if:   You notice a bad smell coming from the incision site.   You have swelling, redness, or drainage at the incision site.   You have more swelling or pain at the port site or the surrounding area.   You have a fever that is not controlled with medicine. Document  Released: 11/17/2005 Document Revised: 09/07/2013 Document Reviewed: 07/25/2013 [facility] Patient Information 2015 [place], [place]. This information is not intended to replace advice given to you by your health care provider. Make sure you discuss any questions you have with your health care provider.  May remove dressing and shower in 24 to 48 hours post procedure.  Keep wound clean and dry. Report signs of infection. Conscious Sedation Sedation is the use of medicines to promote relaxation and relieve discomfort and anxiety. Conscious sedation is a type of sedation. Under conscious sedation you are less alert than normal but are still able to respond to instructions or stimulation. Conscious sedation is used during short medical and dental procedures. It is  milder than deep sedation or general anesthesia and allows you to return to your regular activities sooner.  LET [facility] CARE PROVIDER KNOW ABOUT:   Any allergies you have.  All medicines you are taking, including vitamins, herbs, eye drops, creams, and over-the-counter medicines.  Use of steroids (by mouth or creams).  Previous problems you or members of your family have had with the use of anesthetics.  Any blood disorders you have.  Previous surgeries you have had.  Medical conditions you have.  Possibility of pregnancy, if this applies.  Use of cigarettes, alcohol, or illegal drugs. RISKS AND COMPLICATIONS Generally, this is a safe procedure. However, as with any procedure, problems can occur. Possible problems include:  Oversedation.  Trouble breathing on your own. You may need to have a breathing tube until you are awake and breathing on your own.  Allergic reaction to any of the medicines used for the procedure. BEFORE THE PROCEDURE  You may have blood tests done. These tests can help show how well your kidneys and liver are working. They can also show how well your blood clots.  A physical exam will be  done.  Only take medicines as directed by your health care provider. You may need to stop taking medicines (such as blood thinners, aspirin, or nonsteroidal anti-inflammatory drugs) before the procedure.   Do not eat or drink at least 6 hours before the procedure or as directed by your health care provider.  Arrange for a responsible adult, family member, or friend to take you home after the procedure. He or she should stay with you for at least 24 hours after the procedure, until the medicine has worn off. PROCEDURE   An intravenous (IV) catheter will be inserted into one of your veins. Medicine will be able to flow directly into your body through this catheter. You may be given medicine through this tube to help prevent pain and help you relax.  The medical or dental procedure will be done. AFTER THE PROCEDURE  You will stay in a recovery area until the medicine has worn off. Your blood pressure and pulse will be checked.   Depending on the procedure you had, you may be allowed to go home when you can tolerate liquids and your pain is under control. Document Released: 08/12/2001 Document Revised: 11/22/2013 Document Reviewed: 07/25/2013 [facility] Patient Information 2015 [place], [place]. This information is not intended to replace advice given to you by your health care provider. Make sure you discuss any questions you have with your health care provider. Conscious Sedation, Adult, Care After Refer to this sheet in the next few weeks. These instructions provide you with information on caring for yourself after your procedure. Your health care provider may also give you more specific instructions. Your treatment has been planned according to current medical practices, but problems sometimes occur. Call your health care provider if you have any problems or questions after your procedure. WHAT TO EXPECT AFTER THE PROCEDURE  After your procedure:  You may feel sleepy, clumsy, and have poor  balance for several hours.  Vomiting may occur if you eat too soon after the procedure. HOME CARE INSTRUCTIONS  Do not participate in any activities where you could become injured for at least 24 hours. Do not:  Drive.  Swim.  Ride a bicycle.  Operate heavy machinery.  Cook.  Use power tools.  Climb ladders.  Work from a high place.  Do not make important decisions or sign legal documents until  you are improved.  If you vomit, drink water, juice, or soup when you can drink without vomiting. Make sure you have little or no nausea before eating solid foods.  Only take over-the-counter or prescription medicines for pain, discomfort, or fever as directed by your health care provider.  Make sure you and your family fully understand everything about the medicines given to you, including what side effects may occur.  You should not drink alcohol, take sleeping pills, or take medicines that cause drowsiness for at least 24 hours.  If you smoke, do not smoke without supervision.  If you are feeling better, you may resume normal activities 24 hours after you were sedated.  Keep all appointments with your health care provider. SEEK MEDICAL CARE IF:  Your skin is pale or bluish in color.  You continue to feel nauseous or vomit.  Your pain is getting worse and is not helped by medicine.  You have bleeding or swelling.  You are still sleepy or feeling clumsy after 24 hours. SEEK IMMEDIATE MEDICAL CARE IF:  You develop a rash.  You have difficulty breathing.  You develop any type of allergic problem.  You have a fever. MAKE SURE YOU:  Understand these instructions.  Will watch your condition.  Will get help right away if you are not doing well or get worse. Document Released: 09/07/2013 Document Reviewed: 09/07/2013 [facility] Patient Information 2015 [place], [place]. This information is not intended to replace advice given to you by your health care provider. Make  sure you discuss any questions you have with your health care provider.

## 2015-05-09 NOTE — Progress Notes (Signed)
Oncology Nurse Navigator Documentation  Oncology Nurse Navigator Flowsheets 05/09/2015  Referral date to RadOnc/MedOnc -  Navigator Encounter Type Other  Patient Visit Type Surgery  To provide support and encouragement, care continuity and to assess for needs, met with patient in [place] where he was preparing for [facility] placement.  His dtr, Amy, was with him. I supported MD's discussion of procedure, answered patient's questions about today's appt with [name], start of RT tomorrow.  He understands that I will be joining him when he sees [name] this afternoon.   Barriers/Navigation Needs -  Interventions -  Time Spent with Patient [place], RN, BSN, [place] at [place] [phone]

## 2015-05-09 NOTE — Progress Notes (Signed)
[place] OFFICE PROGRESS NOTE  Patient Care Team: Leota Sauers, RN as Oncology Nurse Navigator [name], MD as Consulting Physician (Hematology and Oncology) Eppie Gibson, MD as Attending Physician (Radiation Oncology) Karie Mainland, RD as Dietitian (Nutrition)  SUMMARY OF ONCOLOGIC HISTORY:   Tongue cancer   04/18/2015 Initial Diagnosis Tongue cancer   04/24/2015 Imaging PET CT showed tongue cancer, lung nodule and possible bone mets   04/26/2015 Surgery He had dental extractions   05/09/2015 Procedure Port-a-cath placed.    INTERVAL HISTORY: Please see below for problem oriented charting. He complained of neck pain, swelling and anxiety His pain is reasonably controlled with 4 mg dilaudid as needed He complained of mild constipation, resolved with laxatives. Denies nausea or dysphagia  REVIEW OF SYSTEMS:   Constitutional: Denies fevers, chills or abnormal weight loss Eyes: Denies blurriness of vision Ears, nose, mouth, throat, and face: Denies mucositis or sore throat Respiratory: Denies cough, dyspnea or wheezes Cardiovascular: Denies palpitation, chest discomfort or lower extremity swelling Skin: Denies abnormal skin rashes Lymphatics: Denies new lymphadenopathy or easy bruising Neurological:Denies numbness, tingling or new weaknesses Behavioral/Psych: Mood is stable, no new changes  All other systems were reviewed with the patient and are negative.  I have reviewed the past medical history, past surgical history, social history and family history with the patient and they are unchanged from previous note.  ALLERGIES:  is allergic to morphine and related.  MEDICATIONS:  Current Outpatient Prescriptions  Medication Sig Dispense Refill  . amLODipine (NORVASC) 5 MG tablet Take 5 mg by mouth daily.     . Armodafinil 250 MG tablet Take 250 mg by mouth daily.    Marland Kitchen dexamethasone (DECADRON) 2 MG tablet Take 1 tablet (2 mg total) by mouth 2 (two) times  daily. (Patient taking differently: Take 2 mg by mouth 4 (four) times daily. ) 60 tablet 1  . diphenhydrAMINE (BENADRYL) 25 mg capsule Take 25 mg by mouth every 6 (six) hours as needed for itching.    . gabapentin (NEURONTIN) 800 MG tablet Take 2,400 mg by mouth 2 (two) times daily.    Marland Kitchen HYDROmorphone (DILAUDID) 4 MG tablet Take 1 tablet (4 mg total) by mouth every 4 (four) hours as needed for severe pain. 60 tablet 0  . ibuprofen (ADVIL,MOTRIN) 200 MG tablet Take 400 mg by mouth every 6 (six) hours as needed for mild pain or moderate pain.    Marland Kitchen lansoprazole (PREVACID) 30 MG capsule Take 30 mg by mouth daily at 12 noon.    Marland Kitchen oxymetazoline (AFRIN) 0.05 % nasal spray Place 2 sprays into both nostrils daily as needed for congestion.    . polyethylene glycol powder (GLYCOLAX/MIRALAX) powder Take 17 g by mouth 2 (two) times daily. 255 g 0  . traZODone (DESYREL) 100 MG tablet Take 100 mg by mouth at bedtime.    . lidocaine-prilocaine (EMLA) cream Apply 1 application topically as needed. To Port a cath site one hour prior to needle stick 30 g 3   No current facility-administered medications for this visit.   Facility-Administered Medications Ordered in Other Visits  Medication Dose Route Frequency Provider Last Rate Last Dose  . 0.9 %  sodium chloride infusion   Intravenous Continuous Hedy Jacob, PA-C 75 mL/hr at 05/09/15 0900    . lidocaine (XYLOCAINE) 1 % (with pres) injection           . lidocaine-EPINEPHrine (XYLOCAINE W/EPI) 2 %-1:100000 (with pres) injection  PHYSICAL EXAMINATION: ECOG PERFORMANCE STATUS: 1 - Symptomatic but completely ambulatory  Filed Vitals:   05/09/15 1318  BP: 143/80  Pulse: 62  Temp: 98.2 F (36.8 C)  Resp: 19   Filed Weights   05/09/15 1318  Weight: 251 lb 12.8 oz (114.216 kg)    GENERAL:alert, no distress and comfortable SKIN: skin color, texture, turgor are normal, no rashes or significant lesions EYES: normal, Conjunctiva are pink and  non-injected, sclera clear OROPHARYNX:no exudate, no erythema and lips, buccal mucosa, and tongue normal  NECK: Persistent neck swelling Musculoskeletal:no cyanosis of digits and no clubbing  NEURO: alert & oriented x 3 with fluent speech, no focal motor/sensory deficits  LABORATORY DATA:  I have reviewed the data as listed    Component Value Date/Time   NA 136 05/03/2015 2344   K 4.2 05/03/2015 2344   CL 103 05/03/2015 2344   CO2 23 05/03/2015 2344   GLUCOSE 113* 05/03/2015 2344   BUN 20 05/03/2015 2344   CREATININE 1.11 05/03/2015 2344   CALCIUM 9.1 05/03/2015 2344   PROT 7.9 11/20/2014 1115   ALBUMIN 4.1 11/20/2014 1115   AST 22 11/20/2014 1115   ALT 22 11/20/2014 1115   ALKPHOS 117 11/20/2014 1115   BILITOT 0.4 11/20/2014 1115   GFRNONAA >60 05/03/2015 2344   GFRAA >60 05/03/2015 2344    No results found for: SPEP, UPEP  Lab Results  Component Value Date   WBC 10.0 05/09/2015   NEUTROABS 8.2* 05/09/2015   HGB 13.0 05/09/2015   HCT 38.8* 05/09/2015   MCV 85.1 05/09/2015   PLT 287 05/09/2015      Chemistry      Component Value Date/Time   NA 136 05/03/2015 2344   K 4.2 05/03/2015 2344   CL 103 05/03/2015 2344   CO2 23 05/03/2015 2344   BUN 20 05/03/2015 2344   CREATININE 1.11 05/03/2015 2344      Component Value Date/Time   CALCIUM 9.1 05/03/2015 2344   ALKPHOS 117 11/20/2014 1115   AST 22 11/20/2014 1115   ALT 22 11/20/2014 1115   BILITOT 0.4 11/20/2014 1115       RADIOGRAPHIC STUDIES: I reviewed the CT scan with him I have personally reviewed the radiological images as listed and agreed with the findings in the report. Ir Fluoro Guide Cv Line Right  05/09/2015   CLINICAL DATA:  59 year old male with squamous cell carcinoma of the tongue in need of durable central venous access for chemotherapy.  EXAM: IR RIGHT FLOURO GUIDE CV LINE; IR ULTRASOUND GUIDANCE VASC ACCESS RIGHT  Date: 05/09/2015  ANESTHESIA/SEDATION: Moderate (conscious) sedation was  administered during this procedure. A total of 1.5 mg Versed and 75 mg Fentanyl were administered intravenously. The patient's vital signs were monitored continuously by radiology nursing throughout the course of the procedure.  Total sedation time: 21 minutes  FLUOROSCOPY TIME:  12 seconds  7 mGy  TECHNIQUE: The right neck and chest was prepped with chlorhexidine, and draped in the usual sterile fashion using maximum barrier technique (cap and mask, sterile gown, sterile gloves, large sterile sheet, hand hygiene and cutaneous antiseptic). Antibiotic prophylaxis was provided with 2g Ancef administered IV one hour prior to skin incision. Local anesthesia was attained by infiltration with 1% lidocaine with epinephrine.  Ultrasound demonstrated patency of the right internal jugular vein, and this was documented with an image. Under real-time ultrasound guidance, this vein was accessed with a 21 gauge micropuncture needle and image documentation was performed. A small dermatotomy  was made at the access site with an 11 scalpel. A 0.018" wire was advanced into the SVC and the access needle exchanged for a 71F micropuncture vascular sheath. The 0.018" wire was then removed and a 0.035" wire advanced into the IVC.  An appropriate location for the subcutaneous reservoir was selected below the clavicle and an incision was made through the skin and underlying soft tissues. The subcutaneous tissues were then dissected using a combination of blunt and sharp surgical technique and a pocket was formed. A single lumen power injectable portacatheter was then tunneled through the subcutaneous tissues from the pocket to the dermatotomy and the port reservoir placed within the subcutaneous pocket.  The venous access site was then serially dilated and a peel away vascular sheath placed over the wire. The wire was removed and the port catheter advanced into position under fluoroscopic guidance. The catheter tip is positioned in the upper  right atrium. This was documented with a spot image. The portacatheter was then tested and found to flush and aspirate well. The port was flushed with saline followed by 100 units/mL heparinized saline.  The pocket was then closed in two layers using first subdermal inverted interrupted absorbable sutures followed by a running subcuticular suture. The epidermis was then sealed with Dermabond. The dermatotomy at the venous access site was also closed with a single inverted subdermal suture and the epidermis sealed with Dermabond.  COMPLICATIONS: None.  The patient tolerated the procedure well.  IMPRESSION: Successful placement of a right IJ approach Power Port with ultrasound and fluoroscopic guidance. The catheter is ready for use.  Signed,  Criselda Peaches, MD  [facility]  [facility]   Electronically Signed   By: Jacqulynn Cadet M.D.   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  EXAM: IR RIGHT FLOURO GUIDE CV LINE; IR ULTRASOUND GUIDANCE VASC ACCESS RIGHT  Date: 05/09/2015  ANESTHESIA/SEDATION: Moderate (conscious) sedation was administered during this procedure. A total of 1.5 mg Versed and 75 mg Fentanyl were administered intravenously. The patient's vital signs were monitored continuously by radiology nursing throughout the course of the procedure.  Total sedation time: 21 minutes  FLUOROSCOPY TIME:  12 seconds  7 mGy  TECHNIQUE: The right neck and chest was prepped with chlorhexidine, and draped in the usual sterile fashion using maximum barrier technique (cap and mask, sterile gown, sterile gloves, large sterile sheet, hand hygiene and cutaneous antiseptic). Antibiotic prophylaxis was provided with 2g Ancef administered IV one hour prior to skin incision. Local anesthesia was attained by infiltration  with 1% lidocaine with epinephrine.  Ultrasound demonstrated patency of the right internal jugular vein, and this was documented with an image. Under real-time ultrasound guidance, this vein was accessed with a 21 gauge micropuncture needle and image documentation was performed. A small dermatotomy was made at the access site with an 11 scalpel. A 0.018" wire was advanced into the SVC and the access needle exchanged for a 71F micropuncture vascular sheath. The 0.018" wire was then removed and a 0.035" wire advanced into the IVC.  An appropriate location for the subcutaneous reservoir was selected below the clavicle and an incision was made through the skin and underlying soft tissues. The subcutaneous tissues were then dissected using a combination of blunt and sharp surgical technique and a pocket was  formed. A single lumen power injectable portacatheter was then tunneled through the subcutaneous tissues from the pocket to the dermatotomy and the port reservoir placed within the subcutaneous pocket.  The venous access site was then serially dilated and a peel away vascular sheath placed over the wire. The wire was removed and the port catheter advanced into position under fluoroscopic guidance. The catheter tip is positioned in the upper right atrium. This was documented with a spot image. The portacatheter was then tested and found to flush and aspirate well. The port was flushed with saline followed by 100 units/mL heparinized saline.  The pocket was then closed in two layers using first subdermal inverted interrupted absorbable sutures followed by a running subcuticular suture. The epidermis was then sealed with Dermabond. The dermatotomy at the venous access site was also closed with a single inverted subdermal suture and the epidermis sealed with Dermabond.  COMPLICATIONS: None.  The patient tolerated the procedure well.  IMPRESSION: Successful placement of a right IJ approach Power Port with ultrasound and  fluoroscopic guidance. The catheter is ready for use.  Signed,  Criselda Peaches, MD  [facility]  [facility]   Electronically Signed   By: Jacqulynn Cadet M.D.   On: 05/09/2015 17:45     ASSESSMENT & PLAN:  Tongue cancer He has poor overall performance status with uncontrolled pain, anxiety and others. I feel like we need to take it slow in a step wise fashion First order of business is to get his pain under controlled. We discussed about the role of pain management in the oncology clinic.  I plan to increase his Dilaudid to 4 mg q 4 hours/prn pain and reassess next week He will continue on q 6 hours dexamethasone as well  Chronic neck pain He has chronic neck pain from degenerative arthritis and probably from cancer. I will prescribe hydromorphone and continue dexamethasone low-dose.    Neck swelling This is related to cancer I review recent Ct with him I recommend we continue dexamethasone and start radiation treatment ASAP   No orders of the defined types were placed in this encounter.   All questions were answered. The patient knows to call the clinic with any problems, questions or concerns. No barriers to learning was detected. I spent 25 minutes counseling the patient face to face. The total time spent in the appointment was 30 minutes and more than 50% was on counseling and review of test results     [facility], [place], MD 05/09/2015 7:25 PM

## 2015-05-09 NOTE — Assessment & Plan Note (Signed)
This is related to cancer I review recent Ct with him I recommend we continue dexamethasone and start radiation treatment ASAP

## 2015-05-09 NOTE — Assessment & Plan Note (Signed)
He has poor overall performance status with uncontrolled pain, anxiety and others. I feel like we need to take it slow in a step wise fashion First order of business is to get his pain under controlled. We discussed about the role of pain management in the oncology clinic.  I plan to increase his Dilaudid to 4 mg q 4 hours/prn pain and reassess next week He will continue on q 6 hours dexamethasone as well

## 2015-05-09 NOTE — Assessment & Plan Note (Signed)
He has chronic neck pain from degenerative arthritis and probably from cancer. I will prescribe hydromorphone and continue dexamethasone low-dose.

## 2015-05-09 NOTE — Progress Notes (Signed)
Patient ID: Shane Cantu, male   DOB: 1955-12-26, 59 y.o.   MRN: [id number]    Referring Physician(s): Gorsuch,Ni  Subjective:  Recent for me to IR service from recent left neck mass biopsy. 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 He continues to have left neck pain, dysphagia, occasional headaches, and occasional dyspnea with exertion.  Allergies: Morphine and related  Medications: Prior to Admission medications   Medication Sig Start Date End Date Taking? Authorizing Provider  amLODipine (NORVASC) 5 MG tablet Take 5 mg by mouth daily.     Historical Provider, MD  Armodafinil 250 MG tablet Take 250 mg by mouth daily.    Historical Provider, MD  dexamethasone (DECADRON) 2 MG tablet Take 1 tablet (2 mg total) by mouth 2 (two) times daily. Patient taking differently: Take 2 mg by mouth 4 (four) times daily.  05/02/15   [name], MD  diphenhydrAMINE (BENADRYL) 25 mg capsule Take 25 mg by mouth every 6 (six) hours as needed for itching.    Historical Provider, MD  gabapentin (NEURONTIN) 800 MG tablet Take 2,400 mg by mouth 2 (two) times daily.    Historical Provider, MD  HYDROmorphone (DILAUDID) 2 MG tablet Take 1 tablet (2 mg total) by mouth every 6 (six) hours as needed for severe pain. Patient taking differently: Take 4 mg by mouth every 6 (six) hours as needed for severe pain.  05/02/15   [name], MD  ibuprofen (ADVIL,MOTRIN) 200 MG tablet Take 400 mg by mouth every 6 (six) hours as needed for mild pain or moderate pain.    Historical Provider, MD  lansoprazole (PREVACID) 30 MG capsule Take 30 mg by mouth daily at 12 noon.    Historical Provider, MD  oxymetazoline (AFRIN) 0.05 % nasal spray Place 2 sprays into both nostrils daily as needed for congestion.    Historical Provider, MD  polyethylene glycol powder (GLYCOLAX/MIRALAX) powder Take 17 g by mouth 2 (two) times daily. 04/06/13    Veryl Speak, MD  traZODone (DESYREL) 100 MG tablet Take 100 mg by mouth at bedtime.    Historical Provider, MD     Vital Signs: BP 157/90 mmHg  Pulse 57  Temp(Src) 98.3 F (36.8 C) (Oral)  Resp 20  SpO2 100%  Physical Exam patient awake, alert. Chest clear to auscultation bilaterally. Heart with regular rate and rhythm. Abdomen soft, obese, positive bowel sounds, nontender; large tender left neck mass; extremities with full range of motion and no significant edema  Imaging: No results found.  Labs:  CBC:  Recent Labs  11/20/14 1115 04/13/15 1322 05/03/15 2344  WBC 7.2 8.2 7.9  HGB 15.3 14.8 13.1  HCT 46.6 44.4 38.8*  PLT 190 209 292    COAGS:  Recent Labs  04/13/15 1322  INR 1.12  APTT 30    BMP:  Recent Labs  11/20/14 1115 04/26/15 0713 05/03/15 2344  NA 139 136 136  K 4.6 4.0 4.2  CL 101 101 103  CO2 24 24 23   GLUCOSE 145* 146* 113*  BUN 22 25* 20  CALCIUM 9.6 9.4 9.1  CREATININE 1.10 1.26* 1.11  GFRNONAA 71* 60* >60  GFRAA 82* >60 >60    LIVER FUNCTION TESTS:  Recent Labs  11/20/14 1115  BILITOT 0.4  AST 22  ALT 22  ALKPHOS 117  PROT 7.9  ALBUMIN 4.1    Assessment and Plan:  Patient with  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 He presents today for Port-A-Cath placement for palliative chemotherapy.Risks and benefits discussed with the patient including, but not limited to bleeding, infection, pneumothorax, or fibrin sheath development and need for additional procedures. All of the patient's questions were answered, patient is agreeable to proceed. Consent signed and in chart.    Signed: D. Rowe Robert 05/09/2015, 9:26 AM   I spent a total of 15 minutes in face to face in clinical consultation/evaluation, greater than 50% of which was counseling/coordinating care for Port-A-Cath placement

## 2015-05-09 NOTE — Procedures (Signed)
Interventional Radiology Procedure Note  Procedure: Placement of a right IJ approach single lumen PowerPort.  Tip is positioned at the superior cavoatrial junction and catheter is ready for immediate use.  Complications: No immediate Recommendations:  - Ok to shower tomorrow - Do not submerge for 7 days - Routine line care   Signed,  Nyella Eckels K. Skya Mccullum, MD   

## 2015-05-10 ENCOUNTER — Encounter: Payer: Self-pay | Admitting: *Deleted

## 2015-05-10 ENCOUNTER — Ambulatory Visit
Admission: RE | Admit: 2015-05-10 | Discharge: 2015-05-10 | Disposition: A | Discharge status: Home / Self Care | Payer: PPO | Source: Ambulatory Visit | Attending: Radiation Oncology | Admitting: Radiation Oncology

## 2015-05-10 DIAGNOSIS — C01 Malignant neoplasm of base of tongue: Secondary | ICD-10-CM | POA: Diagnosis not present

## 2015-05-10 NOTE — Progress Notes (Signed)
Oncology Nurse Navigator Documentation  Oncology Nurse Navigator Flowsheets 05/10/2015  Referral date to RadOnc/MedOnc -  Navigator Encounter Type Treatment  Patient Visit Type Radonc  Treatment Phase First Radiation Tx  To provide support, encouragement and care continuity, met with patient during initial RT on LINAC 3.  [name] accompanied him. She reported:  He has been unusually relaxed, has not been anxious about starting tmt.  Pain is better controlled since starting 4 mg dilaudid q4h. He tolerated procedure without incident/difficulty.   Barriers/Navigation Needs -  Interventions -  Time Spent with Patient [place], RN, BSN, [place] at [place] [phone]

## 2015-05-11 ENCOUNTER — Ambulatory Visit
Admission: RE | Admit: 2015-05-11 | Discharge: 2015-05-11 | Disposition: A | Discharge status: Home / Self Care | Payer: PPO | Source: Ambulatory Visit | Attending: Radiation Oncology | Admitting: Radiation Oncology

## 2015-05-11 DIAGNOSIS — C01 Malignant neoplasm of base of tongue: Secondary | ICD-10-CM | POA: Diagnosis not present

## 2015-05-14 ENCOUNTER — Telehealth: Payer: Self-pay | Admitting: *Deleted

## 2015-05-14 ENCOUNTER — Ambulatory Visit
Admission: RE | Admit: 2015-05-14 | Discharge: 2015-05-14 | Disposition: A | Discharge status: Home / Self Care | Payer: PPO | Source: Ambulatory Visit | Attending: Radiation Oncology | Admitting: Radiation Oncology

## 2015-05-14 ENCOUNTER — Encounter: Payer: Self-pay | Admitting: Radiation Oncology

## 2015-05-14 VITALS — BP 154/80 | HR 66 | Temp 97.8°F | Resp 16 | Ht 69.0 in | Wt 257.7 lb

## 2015-05-14 DIAGNOSIS — C01 Malignant neoplasm of base of tongue: Secondary | ICD-10-CM | POA: Diagnosis not present

## 2015-05-14 MED ORDER — BIAFINE EX EMUL
Freq: Once | CUTANEOUS | Status: AC
  Administered 2015-05-14: 13:00:00 via TOPICAL

## 2015-05-14 MED ORDER — LIDOCAINE VISCOUS 2 % MT SOLN: OROMUCOSAL | Status: DC

## 2015-05-14 MED ORDER — SUCRALFATE 1 G PO TABS: ORAL_TABLET | ORAL | Status: DC

## 2015-05-14 NOTE — Telephone Encounter (Signed)
Oncology Nurse Navigator Documentation  Oncology Nurse Navigator Flowsheets 05/14/2015  Referral date to RadOnc/MedOnc -  Navigator Encounter Type Telephone  Patient called to confirm today's and Wednesday's appt times.  I explained the change in registration procedure for RadOnc appts effective today.  He verbalized understanding.  In response to my inquiry, he reported well controlled pain over the weekend secondary to the 4 mg dilaudid regime alternating with Ibuprofen.  Patient Visit Type -  Treatment Phase -  Barriers/Navigation Needs -  Interventions -  Time Spent with Patient [place], RN, BSN, [place] at [place] [phone]

## 2015-05-14 NOTE — Progress Notes (Signed)
Managing Acute Radiation Side Effects for Head and Neck Cancer  Skin irritation:  . Biafine  Topical Emulsion: First-line topical cream to help soothe skin irritation.  Apply to skin in radiation fields at least 4 hours before radiotherapy, or any time after treatments during the rest of the day.  . Triple Antibiotic Ointment (Neosporin): Apply to areas of skin with moist breakdown to prevent infection.  . 1% hydrocortisone cream: Apply to areas of skin that are itching, up to three times a day.  . Silvadene (Silver Sulfadiazine): Used in select cases if large patches of skin develop moist breakdown (let physician or nurse know if you have a "sulfa" drug allergy)  Soreness in mouth or throat: . Baking Soda Rinse: a home remedy to soothe/cleanse mouth and loosen thick saliva.  Mix 1/2 teaspoon salt, 1/2 teaspoon baking soda, 1 pint water (16 oz or two cups).  Swish, gargle and spit as needed to soothe/cleanse mouth. Use as often as you want.  . Sucralfate (Carafate): coats throat to soothe it before meals or any time of day. Crush 1 tablet in 10 mL H20 and swallow up to four times a day.  . 2% viscous Lidocaine (Magic Mouth Wash): Soothes mouth and/or throat by numbing your mucous membranes. Mix 1 part 2% viscous lidocaine (Magic Mouth Wash), 1 part H20. Swish and/or swallow 10mL of this mixture, 30min before meals and at bedtime, up to four times a day. Alternate with Sucralfate (Carafate).  . Narcotics: Various short acting and long acting narcotics can be prescribed.  Often, medical oncology will prescribe these if you are receiving chemotherapy concurrently. Narcotics may cause constipation. It may be helpful to take a stool softener (Docusate Sodium) or gentle laxative (ie Senna or Polyethylene Glycol) to prevent constipation.  Having food in your stomach before ingesting a narcotic may reduce risk of stomach upset.  Thick Saliva: . Baking Soda Rinse: a home remedy to soothe/cleanse mouth  and loosen thick saliva.  Mix 1/2 teaspoon salt, 1/2 teaspoon baking soda, 1 pint water (16 oz or two cups).  Swish, gargle, and spit as needed to soothe/cleanse mouth. Use as often as you want.  . Some patients find Diet Ginger Ale or Papaya Juice to be helpful.  . In extreme cases, your physician may consider prescribing a Scopolamine transdermal patch which dries up your saliva.     Poor taste, or lack of taste:   . There are no well-established medications to combat taste bud changes from radiotherapy.  It often takes weeks to months to regain taste function.  Eating bland foods and drinking nutritional shakes  may help you maintain your weight when food is not enjoyable.  Some patients supplement their oral intake with a feeding tube.  Fatigue and weakness: . There is not a well-established safe and effective medication to combat radiation-induced fatigue.  However, if you are able to perform light exercise (such as a daily walk, yoga, recumbent stationary bicycling), this may combat fatigue and help you maintain muscle mass during treatment.  . Maintaining hydration and nutrition are also important.  If you have not been referred to a nutritionist and would like a referral, please let your nurse or physician know.  . Try to get at least 8 hours of sleep each night. You may need a daily nap, but try not to nap so late that it interferes with your nightly sleep schedule. 

## 2015-05-14 NOTE — Progress Notes (Signed)
Shane Cantu has completed 3 fractions to his neck, c spine and T1-T31 spine.  He reports pain in his posterior neck that he rates at a 7/10.  He is taking dilaudid 4 mg q 4 hours.  He is also taking decadron 2 mg four times a day.  He reports a dry mouth and thick saliva.  He is use a baking soda/salt rinse q 2 hours.  He denies trouble swallowing.  He reports after treatment feeling tingling in his left neck back of his head that made his pain worse.    BP 154/80 mmHg  Pulse 66  Temp(Src) 97.8 F (36.6 C) (Oral)  Resp 16  Ht 5\' 9"  (1.753 m)  Wt 257 lb 11.2 oz (116.892 kg)  BMI 38.04 kg/m2  SpO2 97%

## 2015-05-14 NOTE — Progress Notes (Signed)
   Weekly Management Note:  Outpatient    ICD-9-CM ICD-10-CM   1. 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  CBCT/MVCT images/Port film x-rays were reviewed.  The chart was checked. Shane Cantu has completed 3 fractions to his neck, C spine and T spine. He reports pain in his posterior neck that he rates at a 7/10. He is taking dilaudid 4 mg q 4 hours. He is also taking decadron 2 mg four times a day. He reports a dry mouth and thick saliva. He is use a baking soda/salt rinse q 2 hours. He denies trouble swallowing. He reports after treatment feeling tingling in his left neck back of his head that made his pain worse. Thickening of the saliva has began to get worse. He has been using a baking soda salt rinse. He was curious when he could begin chemotherapy. He had his portacath placed last week and has attended a chemotherapy class.    Physical Findings:  height is 5\' 9"  (1.753 m) and weight is 257 lb 11.2 oz (116.892 kg). His oral temperature is 97.8 F (36.6 C). His blood pressure is 154/80 and his pulse is 66. His respiration is 16 and oxygen saturation is 97%.   Wt Readings from Last 3 Encounters:  05/14/15 257 lb 11.2 oz (116.892 kg)  05/09/15 251 lb 12.8 oz (114.216 kg)  05/02/15 250 lb 4.8 oz (113.535 kg)   No thrush or mucositis in his oropharynx. Skin intact. Palpable left neck mass  Impression:  The patient is tolerating radiotherapy.  Plan:  Continue radiotherapy as planned. Prescribe lidocaine and sucralfate to mix with water; he uses [employer]. Will have Dr. Valere Dross decide when the next follow up will take place based on symptoms. Discussed that he could begin chemotherapy after his radiation treatment is completed, or sooner if [name] feels necessary.   This document serves as a record of services personally performed by Eppie Gibson, MD. It  was created on her behalf by Arlyce Harman, a trained medical scribe. The creation of this record is based on the scribe's personal observations and the provider's statements to them. This document has been checked and approved by the attending provider. ________________________________   Eppie Gibson, M.D.

## 2015-05-14 NOTE — Addendum Note (Signed)
Encounter addended by: Jacqulyn Liner, RN on: 05/14/2015  2:56 PM<BR>     Documentation filed: Notes Section

## 2015-05-14 NOTE — Progress Notes (Signed)
Pt here for patient teaching.  Pt given Radiation and You booklet and skin care instructions.  He was also given Dr. Pearlie Oyster head and neck handout. Reviewed areas of pertinence such as fatigue, skin changes, throat changes and taste changes . Pt able to give teach back of to pat skin and use unscented/gentle soap,avoid applying anything to skin within 4 hours of treatment and to use an electric razor if they must shave. Pt was given Biafine cream and was instructed to apply it to the treatment areas twice a day, after treatment and at bedtime.  Pt demonstrated understanding and verbalizes understanding of information given and will contact nursing with any questions or concerns.

## 2015-05-15 ENCOUNTER — Ambulatory Visit
Admission: RE | Admit: 2015-05-15 | Discharge: 2015-05-15 | Disposition: A | Discharge status: Home / Self Care | Payer: PPO | Source: Ambulatory Visit | Attending: Radiation Oncology | Admitting: Radiation Oncology

## 2015-05-15 ENCOUNTER — Telehealth: Payer: Self-pay | Admitting: *Deleted

## 2015-05-15 DIAGNOSIS — C01 Malignant neoplasm of base of tongue: Secondary | ICD-10-CM | POA: Diagnosis not present

## 2015-05-16 ENCOUNTER — Encounter: Payer: Self-pay | Admitting: Hematology and Oncology

## 2015-05-16 ENCOUNTER — Ambulatory Visit: Payer: Self-pay | Admitting: Hematology and Oncology

## 2015-05-16 ENCOUNTER — Telehealth: Payer: Self-pay | Admitting: *Deleted

## 2015-05-16 ENCOUNTER — Ambulatory Visit
Admission: RE | Admit: 2015-05-16 | Discharge: 2015-05-16 | Disposition: A | Discharge status: Home / Self Care | Payer: PPO | Source: Ambulatory Visit | Attending: Radiation Oncology | Admitting: Radiation Oncology

## 2015-05-16 ENCOUNTER — Encounter: Payer: Self-pay | Admitting: *Deleted

## 2015-05-16 ENCOUNTER — Ambulatory Visit (HOSPITAL_BASED_OUTPATIENT_CLINIC_OR_DEPARTMENT_OTHER): Payer: PPO | Admitting: Hematology and Oncology

## 2015-05-16 ENCOUNTER — Telehealth: Payer: Self-pay | Admitting: Hematology and Oncology

## 2015-05-16 VITALS — BP 152/88 | HR 64 | Temp 98.0°F | Resp 18 | Ht 69.0 in | Wt 257.7 lb

## 2015-05-16 DIAGNOSIS — C7800 Secondary malignant neoplasm of unspecified lung: Secondary | ICD-10-CM

## 2015-05-16 DIAGNOSIS — C779 Secondary and unspecified malignant neoplasm of lymph node, unspecified: Secondary | ICD-10-CM

## 2015-05-16 DIAGNOSIS — C029 Malignant neoplasm of tongue, unspecified: Secondary | ICD-10-CM

## 2015-05-16 DIAGNOSIS — G8929 Other chronic pain: Secondary | ICD-10-CM

## 2015-05-16 DIAGNOSIS — C01 Malignant neoplasm of base of tongue: Secondary | ICD-10-CM | POA: Diagnosis not present

## 2015-05-16 DIAGNOSIS — C7951 Secondary malignant neoplasm of bone: Secondary | ICD-10-CM | POA: Diagnosis not present

## 2015-05-16 DIAGNOSIS — M542 Cervicalgia: Secondary | ICD-10-CM

## 2015-05-16 MED ORDER — LIDOCAINE 5 % EX PTCH: 1.0000 | MEDICATED_PATCH | CUTANEOUS | Status: DC

## 2015-05-16 MED ORDER — HYDROMORPHONE HCL 4 MG PO TABS: 4.0000 mg | ORAL_TABLET | ORAL | Status: DC | PRN

## 2015-05-16 NOTE — Telephone Encounter (Signed)
Pt confirmed MD visit per 06/15 POF, gave pt AVS and Calendar... KJ

## 2015-05-16 NOTE — Progress Notes (Signed)
I faxed prior auth req for lidocaine patch to [facility]

## 2015-05-16 NOTE — Assessment & Plan Note (Signed)
He has poor overall performance status with uncontrolled pain, anxiety and others. He has other new places that cause pain. I recommend additional lidocaine patch to this area. In the meantime, we will continue on the same dose of Dilaudid as needed. He is not due for pain medicine refill and I recommend he stop by at the end of the week to pick up his prescription. After he finished his radiation treatment, I will start to initiate dexamethasone taper.

## 2015-05-16 NOTE — Telephone Encounter (Signed)
Oncology Nurse Navigator Documentation  Oncology Nurse Navigator Flowsheets 05/16/2015  Referral date to RadOnc/MedOnc -  Navigator Encounter Type Telephone  Returned patient's morning VM.    He reported difficulty sleeping last HS d/t back pain and lump on mid-spine.     He stated that mask fit tighter than usual during yesterday's RT.  He suspects he is beginning to retain fluid. He wants [name] to be aware.   Patient Visit Type -  Treatment Phase -  Barriers/Navigation Needs -  Interventions -  Time Spent with Patient [place], RN, BSN, [place] at [place] [phone]

## 2015-05-16 NOTE — Telephone Encounter (Signed)
Oncology Nurse Navigator Documentation  Oncology Nurse Navigator Flowsheets 05/15/2015  Referral date to RadOnc/MedOnc -  Navigator Encounter Type Telephone  Patient called to report increasing L knee pain, "under knee cap".  He stated 4mg  dilaudid not effectively resolving pain.  He wants [name] to be aware for his appt tomorrow.  Patient Visit Type -  Treatment Phase -  Barriers/Navigation Needs -  Interventions -  Time Spent with Patient [place], RN, BSN, [place] at [place] [phone]

## 2015-05-16 NOTE — Progress Notes (Unsigned)
Oncology Nurse Navigator Documentation  Oncology Nurse Navigator Flowsheets 05/16/2015  Referral date to RadOnc/MedOnc -  Navigator Encounter Type Clinic/MDC  Patient Visit Type Medonc  To provide support and encouragement, care continuity and to assess for needs, met with patient during f/u appt with [name].  He was accompanied by his dtr. Patient verbalized understanding:  Tapering off decadron upon completion of RT.  New Rx for lidocaine patches for knee and back pain.  Initiation of chemotherapy likely the week of 05/28/15. He did not express any needs or concerns at this time, I encouraged him to contact me if that changes, he verbalized understanding.  Treatment Phase -  Barriers/Navigation Needs -  Interventions -  Time Spent with Patient [place], RN, BSN, [place] at [place] [phone]

## 2015-05-16 NOTE — Progress Notes (Signed)
[facility] OFFICE PROGRESS NOTE  Patient Care Team: Leota Sauers, RN as Oncology Nurse Navigator [name], MD as Consulting Physician (Hematology and Oncology) Eppie Gibson, MD as Attending Physician (Radiation Oncology) Karie Mainland, RD as Dietitian (Nutrition)  SUMMARY OF ONCOLOGIC HISTORY:   Tongue cancer   04/10/2015 Imaging CT Neck with contrast:  L base of tongue SCC with regional adenopathy; suspected metastatic disease to C2, T2.   04/13/2015 Initial Biopsy Accession [id number]:  Lymph node, needle/core biopsy - SCC, p16 positive.   04/18/2015 Initial Diagnosis Tongue cancer   04/24/2015 Imaging PET CT showed tongue cancer, lung nodule and possible bone mets   04/26/2015 Surgery He had dental extractions   05/03/2015 Imaging CT neck with contrast: L base of tongue with regional adenopathy, metastatic disease C2, C3, T2 vertebra; posterior L fourth rib.   05/09/2015 Procedure Port-a-cath placed.    INTERVAL HISTORY: Please see below for problem oriented charting. He complained of left knee pain and back pain which were new. He continued to have neck pain in the left side of his neck. Denies nausea or vomiting.  REVIEW OF SYSTEMS:   Constitutional: Denies fevers, chills or abnormal weight loss Eyes: Denies blurriness of vision Ears, nose, mouth, throat, and face: Denies mucositis or sore throat Respiratory: Denies cough, dyspnea or wheezes Cardiovascular: Denies palpitation, chest discomfort or lower extremity swelling Gastrointestinal:  Denies nausea, heartburn or change in bowel habits Skin: Denies abnormal skin rashes Lymphatics: Denies new lymphadenopathy or easy bruising Neurological:Denies numbness, tingling or new weaknesses Behavioral/Psych: Mood is stable, no new changes  All other systems were reviewed with the patient and are negative.  I have reviewed the past medical history, past surgical history, social history and family history with the  patient and they are unchanged from previous note.  ALLERGIES:  is allergic to morphine and related.  MEDICATIONS:  Current Outpatient Prescriptions  Medication Sig Dispense Refill  . amLODipine (NORVASC) 5 MG tablet Take 5 mg by mouth daily.     . Armodafinil 250 MG tablet Take 250 mg by mouth daily.    Marland Kitchen dexamethasone (DECADRON) 2 MG tablet Take 1 tablet (2 mg total) by mouth 2 (two) times daily. (Patient taking differently: Take 2 mg by mouth 4 (four) times daily. ) 60 tablet 1  . diphenhydrAMINE (BENADRYL) 25 mg capsule Take 25 mg by mouth every 6 (six) hours as needed for itching.    Marland Kitchen emollient (BIAFINE) cream Apply topically as needed.    . famotidine (PEPCID) 20 MG tablet Take 20 mg by mouth 2 (two) times daily.    Marland Kitchen gabapentin (NEURONTIN) 800 MG tablet Take 2,400 mg by mouth 2 (two) times daily.    Marland Kitchen HYDROmorphone (DILAUDID) 4 MG tablet Take 1 tablet (4 mg total) by mouth every 4 (four) hours as needed for severe pain. 60 tablet 0  . ibuprofen (ADVIL,MOTRIN) 200 MG tablet Take 400 mg by mouth every 6 (six) hours as needed for mild pain or moderate pain.    Marland Kitchen lansoprazole (PREVACID) 30 MG capsule Take 30 mg by mouth daily at 12 noon.    . lidocaine (XYLOCAINE) 2 % solution Patient: Mix 1part 2% viscous lidocaine, 1part H20. Swish and/or swallow 12mL of this mixture, 80min before meals and at bedtime, up to QID 100 mL 4  . lidocaine-prilocaine (EMLA) cream Apply 1 application topically as needed. To Port a cath site one hour prior to needle stick 30 g 3  . oxymetazoline (AFRIN)  0.05 % nasal spray Place 2 sprays into both nostrils daily as needed for congestion.    . polyethylene glycol powder (GLYCOLAX/MIRALAX) powder Take 17 g by mouth 2 (two) times daily. 255 g 0  . sucralfate (CARAFATE) 1 G tablet Dissolve 1 tablet in 10 mL H20 and swallow up to QID as needed for sore throat 40 tablet 3  . traZODone (DESYREL) 100 MG tablet Take 100 mg by mouth at bedtime.    . lidocaine (LIDODERM) 5  % Place 1 patch onto the skin daily. Remove & Discard patch within 12 hours or as directed by MD 30 patch 0   No current facility-administered medications for this visit.    PHYSICAL EXAMINATION: ECOG PERFORMANCE STATUS: 1 - Symptomatic but completely ambulatory  Filed Vitals:   05/16/15 1307  BP: 152/88  Pulse: 64  Temp: 98 F (36.7 C)  Resp: 18   Filed Weights   05/16/15 1307  Weight: 257 lb 11.2 oz (116.892 kg)    GENERAL:alert, no distress and comfortable SKIN: skin color, texture, turgor are normal, no rashes or significant lesions EYES: normal, Conjunctiva are pink and non-injected, sclera clear OROPHARYNX:no exudate, no erythema and lips, buccal mucosa, and tongue normal  NECK: Persistent palpable mass unchanged compared to prior exam.  Musculoskeletal:no cyanosis of digits and no clubbing  NEURO: alert & oriented x 3 with fluent speech, no focal motor/sensory deficits  LABORATORY DATA:  I have reviewed the data as listed    Component Value Date/Time   NA 136 05/03/2015 2344   K 4.2 05/03/2015 2344   CL 103 05/03/2015 2344   CO2 23 05/03/2015 2344   GLUCOSE 113* 05/03/2015 2344   BUN 20 05/03/2015 2344   CREATININE 1.11 05/03/2015 2344   CALCIUM 9.1 05/03/2015 2344   PROT 7.9 11/20/2014 1115   ALBUMIN 4.1 11/20/2014 1115   AST 22 11/20/2014 1115   ALT 22 11/20/2014 1115   ALKPHOS 117 11/20/2014 1115   BILITOT 0.4 11/20/2014 1115   GFRNONAA >60 05/03/2015 2344   GFRAA >60 05/03/2015 2344    No results found for: SPEP, UPEP  Lab Results  Component Value Date   WBC 10.0 05/09/2015   NEUTROABS 8.2* 05/09/2015   HGB 13.0 05/09/2015   HCT 38.8* 05/09/2015   MCV 85.1 05/09/2015   PLT 287 05/09/2015      Chemistry      Component Value Date/Time   NA 136 05/03/2015 2344   K 4.2 05/03/2015 2344   CL 103 05/03/2015 2344   CO2 23 05/03/2015 2344   BUN 20 05/03/2015 2344   CREATININE 1.11 05/03/2015 2344      Component Value Date/Time   CALCIUM 9.1  05/03/2015 2344   ALKPHOS 117 11/20/2014 1115   AST 22 11/20/2014 1115   ALT 22 11/20/2014 1115   BILITOT 0.4 11/20/2014 1115      ASSESSMENT & PLAN:  Tongue cancer He has poor overall performance status with uncontrolled pain, anxiety and others. He has other new places that cause pain. I recommend additional lidocaine patch to this area. In the meantime, we will continue on the same dose of Dilaudid as needed. He is not due for pain medicine refill and I recommend he stop by at the end of the week to pick up his prescription. After he finished his radiation treatment, I will start to initiate dexamethasone taper.   No orders of the defined types were placed in this encounter.   All questions were answered. The  patient knows to call the clinic with any problems, questions or concerns. No barriers to learning was detected. I spent 15 minutes counseling the patient face to face. The total time spent in the appointment was 20 minutes and more than 50% was on counseling and review of test results     [facility], [place], MD 05/16/2015 1:47 PM

## 2015-05-16 NOTE — Telephone Encounter (Signed)
[facility] faxed Prior authorization request for Lidocaine 5%.  Request to Managed [employer] for review.

## 2015-05-17 ENCOUNTER — Ambulatory Visit
Admission: RE | Admit: 2015-05-17 | Discharge: 2015-05-17 | Disposition: A | Discharge status: Home / Self Care | Payer: PPO | Source: Ambulatory Visit | Attending: Radiation Oncology | Admitting: Radiation Oncology

## 2015-05-17 ENCOUNTER — Encounter: Payer: Self-pay | Admitting: Hematology and Oncology

## 2015-05-17 DIAGNOSIS — C01 Malignant neoplasm of base of tongue: Secondary | ICD-10-CM | POA: Diagnosis not present

## 2015-05-17 NOTE — Progress Notes (Signed)
Per [facility] Lidocaine patch 5% approved 05/06/15-12/01/15.

## 2015-05-18 ENCOUNTER — Other Ambulatory Visit: Payer: Self-pay | Admitting: Hematology and Oncology

## 2015-05-18 ENCOUNTER — Encounter: Payer: Self-pay | Admitting: *Deleted

## 2015-05-18 ENCOUNTER — Ambulatory Visit
Admission: RE | Admit: 2015-05-18 | Discharge: 2015-05-18 | Disposition: A | Discharge status: Home / Self Care | Payer: PPO | Source: Ambulatory Visit | Attending: Radiation Oncology | Admitting: Radiation Oncology

## 2015-05-18 DIAGNOSIS — C01 Malignant neoplasm of base of tongue: Secondary | ICD-10-CM | POA: Diagnosis not present

## 2015-05-18 MED ORDER — HYDROMORPHONE HCL 4 MG PO TABS: 4.0000 mg | ORAL_TABLET | ORAL | Status: DC | PRN

## 2015-05-18 NOTE — Progress Notes (Signed)
Oncology Nurse Navigator Documentation  Oncology Nurse Navigator Flowsheets 05/18/2015  Referral date to RadOnc/MedOnc -  Navigator Encounter Type Treatment  To provide support and encouragement, care continuity and to assess for needs, met with patient prior o RT.  He is not wearing Lidocaine patch on back "doesn't really hurt now", not wearing on knee "have trouble keeping it on".  I offered Tefla tape, he stated he has tape at home. He stated tmts are gong well despite mask tightness. He did not express any needs or concerns at this time, I encouraged him to contact me if that changes, he verbalized understanding.   Patient Visit Type -  Treatment Phase -  Barriers/Navigation Needs -  Interventions -  Time Spent with Patient [place], RN, BSN, [place] at [place] [phone]

## 2015-05-21 ENCOUNTER — Other Ambulatory Visit: Payer: Self-pay | Admitting: *Deleted

## 2015-05-21 ENCOUNTER — Telehealth: Payer: Self-pay | Admitting: *Deleted

## 2015-05-21 ENCOUNTER — Ambulatory Visit
Admission: RE | Admit: 2015-05-21 | Discharge: 2015-05-21 | Disposition: A | Discharge status: Home / Self Care | Payer: PPO | Source: Ambulatory Visit | Attending: Radiation Oncology | Admitting: Radiation Oncology

## 2015-05-21 ENCOUNTER — Encounter: Payer: Self-pay | Admitting: Radiation Oncology

## 2015-05-21 VITALS — BP 152/84 | HR 63 | Temp 98.6°F | Ht 69.0 in | Wt 258.1 lb

## 2015-05-21 DIAGNOSIS — C01 Malignant neoplasm of base of tongue: Secondary | ICD-10-CM

## 2015-05-21 MED ORDER — DEXAMETHASONE 4 MG PO TABS: 4.0000 mg | ORAL_TABLET | Freq: Two times a day (BID) | ORAL | Status: DC

## 2015-05-21 NOTE — Telephone Encounter (Signed)
Oncology Nurse Navigator Documentation  Oncology Nurse Navigator Flowsheets 05/21/2015  Referral date to RadOnc/MedOnc -  Navigator Encounter Type Telephone  Patient called requesting dilaudid refill.  He stated his pain is being well controlled by taking 4 mg q4 hrs as directed.  He has 4 tablets remaining, would like to pick up script when he comes in for RT this afternoon.  [name] notified.  Patient Visit Type -  Treatment Phase -  Barriers/Navigation Needs -  Interventions -  Time Spent with Patient 15

## 2015-05-21 NOTE — Telephone Encounter (Signed)
Call in 4 mg dexamethasone BID PO, disp 60 tabs no refills

## 2015-05-21 NOTE — Telephone Encounter (Signed)
Oncology Nurse Navigator Documentation  Oncology Nurse Navigator Flowsheets 05/21/2015  Referral date to RadOnc/MedOnc -  Navigator Encounter Type Telephone  Patient called stating insurance will not refill decadron Rx as currently written, he needs Rx reflecting BID frequency.  [name] notified.  Patient Visit Type -  Treatment Phase -  Barriers/Navigation Needs -  Interventions -  Time Spent with Patient [place], RN, BSN, [place] at [place] [phone]

## 2015-05-21 NOTE — Telephone Encounter (Signed)
Oncology Nurse Navigator Documentation  Oncology Nurse Navigator Flowsheets 05/21/2015  Referral date to RadOnc/MedOnc -  Navigator Encounter Type Telephone  Patient called stating new Rx for decadron not available when he picked up dilaudid Rx ([facility]).  I checked time called in.  He checked bag again, found decadron.  Apoplogized "I'm kind of rattled."  He shared that he had to wait an hour to be seen for his weekly UT.  I apologized for the situation, noted he can push button for RN is waiting too long.  He noted his friend saw the sign but it was at the end of the long wait.   Patient Visit Type -  Treatment Phase -  Barriers/Navigation Needs -  Interventions -  Time Spent with Patient [place], RN, BSN, [place] at [place] [phone]

## 2015-05-21 NOTE — Progress Notes (Signed)
Shane Cantu reports level 5-6/10 pain in his posterior neck.   Dexamethasone increased 4 mg 4 x daily.  Needs script.   Oral cavity clear and intact.  Reports that he has a sore throat, but is relieved with Carafate.

## 2015-05-21 NOTE — Progress Notes (Signed)
Weekly Management Note:  Site: Base of tongue/bilateral neck and upper thoracic spine/left third posterior rib Current Dose:  2400  cGy Projected Dose: 3000  cGy  Narrative: The patient is seen today for routine under treatment assessment. CBCT/MVCT images/port films were reviewed. The chart was reviewed.   He states that his neck pain is improved.  He takes up to 5-60 on the day.  He is not on any long-acting pain medication.  He does have a sore throat but this is relieved with Carafate.  His pain today as 5-6/10.  His weight is stable.  He has been having some constipation but this is improved while taking MiraLAX.  Physical Examination:  Filed Vitals:   05/21/15 1326  BP: 152/84  Pulse: 63  Temp: 98.6 F (37 C)  .  Weight: 258 lb 1.6 oz (117.073 kg).  There is a significant mass along his left neck.  On inspection oral cavity there is no candidiasis.  There is mucositis along his oropharynx, left greater than right.  Laboratory data: Lab Results  Component Value Date   WBC 10.0 05/09/2015   HGB 13.0 05/09/2015   HCT 38.8* 05/09/2015   MCV 85.1 05/09/2015   PLT 287 05/09/2015    Impression: Tolerating radiation therapy well.  He'll finish his radiation therapy this Wednesday.  Plan: Continue radiation therapy as planned.  One-month follow-up with Dr. Isidore Moos after he finishes radiation therapy this Wednesday.  He will start chemotherapy after seeing Dr. Calton Dach next week.

## 2015-05-22 ENCOUNTER — Ambulatory Visit
Admission: RE | Admit: 2015-05-22 | Discharge: 2015-05-22 | Disposition: A | Discharge status: Home / Self Care | Payer: PPO | Source: Ambulatory Visit | Attending: Radiation Oncology | Admitting: Radiation Oncology

## 2015-05-22 DIAGNOSIS — C01 Malignant neoplasm of base of tongue: Secondary | ICD-10-CM | POA: Diagnosis not present

## 2015-05-23 ENCOUNTER — Encounter: Payer: Self-pay | Admitting: *Deleted

## 2015-05-23 ENCOUNTER — Ambulatory Visit
Admission: RE | Admit: 2015-05-23 | Discharge: 2015-05-23 | Disposition: A | Discharge status: Home / Self Care | Payer: PPO | Source: Ambulatory Visit | Attending: Radiation Oncology | Admitting: Radiation Oncology

## 2015-05-23 ENCOUNTER — Encounter: Payer: Self-pay | Admitting: Radiation Oncology

## 2015-05-23 DIAGNOSIS — C01 Malignant neoplasm of base of tongue: Secondary | ICD-10-CM | POA: Diagnosis not present

## 2015-05-23 NOTE — Progress Notes (Signed)
Oncology Nurse Navigator Documentation  Oncology Nurse Navigator Flowsheets 05/23/2015  Referral date to RadOnc/MedOnc -  Navigator Encounter Type Treatment  Patient Visit Type Radonc  Met with pt during final RT to offer support and to celebrate end of radiation treatment.  He was accompanied by his dtr and grandson. 1. I provided his dtr a Certificate of Recognition. 2. We discussed next week's appts including discussion with [name] re: start of chemotherapy.   Treatment Phase   Barriers/Navigation Needs -  Interventions -  Time Spent with Patient [place], RN, BSN, [place] at [place] [phone]

## 2015-05-24 ENCOUNTER — Telehealth: Payer: Self-pay | Admitting: *Deleted

## 2015-05-24 NOTE — Telephone Encounter (Signed)
Oncology Nurse Navigator Documentation  Oncology Nurse Navigator Flowsheets 05/24/2015  Referral date to RadOnc/MedOnc -  Navigator Encounter Type Telephone  Patient called to inquire if appt adjustment possible for next Monday's appts so in closer proximity to one another.  I indicated I would check and call him if feasible.  I encouraged him to come to [facility] after his 10:30 Dental Med appt in the event [name] could see him.  He verbalized understanding.  Patient Visit Type -  Treatment Phase -  Barriers/Navigation Needs -  Interventions -  Time Spent with Patient [place], RN, BSN, [place] at [place] [phone]

## 2015-05-28 ENCOUNTER — Ambulatory Visit (HOSPITAL_COMMUNITY): Payer: Self-pay | Admitting: Dentistry

## 2015-05-28 ENCOUNTER — Telehealth: Payer: Self-pay | Admitting: Hematology and Oncology

## 2015-05-28 ENCOUNTER — Ambulatory Visit (HOSPITAL_BASED_OUTPATIENT_CLINIC_OR_DEPARTMENT_OTHER): Payer: PPO | Admitting: Hematology and Oncology

## 2015-05-28 ENCOUNTER — Encounter: Payer: Self-pay | Admitting: *Deleted

## 2015-05-28 ENCOUNTER — Ambulatory Visit (HOSPITAL_BASED_OUTPATIENT_CLINIC_OR_DEPARTMENT_OTHER): Payer: PPO

## 2015-05-28 VITALS — BP 151/76 | HR 77 | Temp 98.2°F | Resp 18 | Ht 69.0 in | Wt 247.5 lb

## 2015-05-28 DIAGNOSIS — M542 Cervicalgia: Secondary | ICD-10-CM

## 2015-05-28 DIAGNOSIS — C01 Malignant neoplasm of base of tongue: Secondary | ICD-10-CM

## 2015-05-28 DIAGNOSIS — G8929 Other chronic pain: Secondary | ICD-10-CM

## 2015-05-28 DIAGNOSIS — C029 Malignant neoplasm of tongue, unspecified: Secondary | ICD-10-CM

## 2015-05-28 LAB — COMPREHENSIVE METABOLIC PANEL (CC13)
ALBUMIN: 3.6 g/dL (ref 3.5–5.0)
ALK PHOS: 125 U/L (ref 40–150)
ALT: 21 U/L (ref 0–55)
AST: 15 U/L (ref 5–34)
Anion Gap: 8 mEq/L (ref 3–11)
BUN: 22.2 mg/dL (ref 7.0–26.0)
CALCIUM: 9.6 mg/dL (ref 8.4–10.4)
CO2: 31 mEq/L — ABNORMAL HIGH (ref 22–29)
Chloride: 99 mEq/L (ref 98–109)
Creatinine: 1.1 mg/dL (ref 0.7–1.3)
EGFR: 72 mL/min/{1.73_m2} — ABNORMAL LOW (ref >90)
Glucose: 120 mg/dl (ref 70–140)
POTASSIUM: 4.1 meq/L (ref 3.5–5.1)
SODIUM: 137 meq/L (ref 136–145)
TOTAL PROTEIN: 7 g/dL (ref 6.4–8.3)
Total Bilirubin: 0.41 mg/dL (ref 0.20–1.20)

## 2015-05-28 LAB — MAGNESIUM (CC13): Magnesium: 2 mg/dl (ref 1.5–2.5)

## 2015-05-28 MED ORDER — HYDROMORPHONE HCL 4 MG PO TABS: 4.0000 mg | ORAL_TABLET | ORAL | Status: DC | PRN

## 2015-05-28 NOTE — Progress Notes (Signed)
Oncology Nurse Navigator Documentation  Oncology Nurse Navigator Flowsheets 05/28/2015  Referral date to RadOnc/MedOnc -  Navigator Encounter Type Clinic/MDC  Patient Visit Type Medonc  Treatment Phase Other  To provide support and encouragement, care continuity and to assess for needs, met with patient during follow-up appt with [name].  [name] accompanied him.  Purpose of appt was to discuss next phase of tmt, chemotherapy.  Patient and dtr verbalized understanding:  Chemo is proposed not as a cure but to control metastatic disease, provide improved quality of life.  Prognosis is 1-2 years with positive response to chemo.  His age and overall health is to his advantage.  Proposed regimen is for 3 cycles followed by PET to check on efficacy, continuation with 3 additional cycles if favorable response.  Chemo to be initiated this Friday.  He did not express any needs or concerns at this time, I encouraged him to contact me if that changes, he verbalized understanding.    Barriers/Navigation Needs -  Interventions -  Time Spent with Patient [place], RN, BSN, [place] at [place] [phone]

## 2015-05-28 NOTE — Telephone Encounter (Signed)
Gave patient avs report and appointments for July and August.  °

## 2015-05-29 ENCOUNTER — Encounter: Payer: Self-pay | Admitting: Hematology and Oncology

## 2015-05-29 NOTE — Progress Notes (Signed)
  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     Plan: The patient has completed radiation treatment. The patient will return to radiation oncology clinic for routine followup in one-2 months. I advised them to call or return sooner if they have any questions or concerns related to their recovery or treatment.  -----------------------------------  Eppie Gibson, MD

## 2015-05-29 NOTE — Progress Notes (Signed)
This encounter was created in error - please disregard.

## 2015-05-29 NOTE — Assessment & Plan Note (Signed)
He had completed all radiation therapy and is doing well. He is eager to start systemic chemotherapy. Intention of treatment is palliative.  The decision was made based on publication at the [facility]. It is a category 1 recommendation from NCCN. Vermorken, et al. Alison Stalling J Med [id number]  The chemotherapy consists of   1. Carboplatin (at an area under the curve of 5 mg per milliliter per minute, as a 1-hour intravenous infusion on day 1) plus  2. Fluorouracil (at a dose of 1000 mg per square meter per day for 4 days) every 3 weeks for a maximum of 6 cycles  3. Cetuximab (at a dose of 400 mg per square meter initially, as a 2-hour intravenous infusion, then 250 mg per square meter, as a 1-hour intravenous infusion per week) for a maximum of 6 cycles.   Patients with stable disease who received chemotherapy plus cetuximab continued to receive cetuximab until disease progression or unacceptable toxic effects, whichever occurred first.  Adding cetuximab to platinum-based chemotherapy with fluorouracil (platinum-fluorouracil) significantly prolonged the median overall survival from 7.4 months in the chemotherapy-alone group to 10.1 months in the group that received chemotherapy plus cetuximab (hazard ratio for death, 0.80; 95% confidence interval, 0.64 to 0.99; P=0.04).   The addition of cetuximab prolonged the median progression-free survival time from 3.3 to 5.6 months (hazard ratio for progression, 0.54; P<0.001) and increased the response rate from 20% to 36% (P<0.001).   There were no cetuximab-related deaths. He agreed with the plan of care. The risks, benefits, side effects of treatment were fully discussed with the patient and his family member and he agreed to proceed.

## 2015-05-29 NOTE — Assessment & Plan Note (Signed)
He has chronic neck pain from degenerative arthritis and probably from cancer. I will refill his pain medication prescription. I will continue dexamethasone taper and he will come off it completely by 06/04/2015

## 2015-05-29 NOTE — Progress Notes (Signed)
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   04/13/2015 Initial Biopsy Accession [id number]:  Lymph node, needle/core biopsy - SCC, p16 positive.   04/18/2015 Initial Diagnosis Tongue cancer   04/24/2015 Imaging PET CT showed tongue cancer, lung nodule and possible bone mets   04/26/2015 Surgery He had dental extractions   05/03/2015 Imaging CT neck with contrast: L base of tongue with regional adenopathy, metastatic disease C2, C3, T2 vertebra; posterior L fourth rib.   05/09/2015 Procedure Port-a-cath placed.   05/11/2015 - 05/23/2015 Radiation Therapy He received palliative radiation therapy    INTERVAL HISTORY: Please see below for problem oriented charting. He returns for further follow-up. His pain is improving. The neck mass is smaller. He averaged only four pain pills per day. Denies any swallowing difficulties.  REVIEW OF SYSTEMS:   Constitutional: Denies fevers, chills or abnormal weight loss Eyes: Denies blurriness of vision Ears, nose, mouth, throat, and face: Denies mucositis or sore throat Respiratory: Denies cough, dyspnea or wheezes Cardiovascular: Denies palpitation, chest discomfort or lower extremity swelling Gastrointestinal:  Denies nausea, heartburn or change in bowel habits Skin: Denies abnormal skin rashes Lymphatics: Denies new lymphadenopathy or easy bruising Neurological:Denies numbness, tingling or new weaknesses Behavioral/Psych: Mood is stable, no new changes  All other systems were  reviewed with the patient and are negative.  I have reviewed the past medical history, past surgical history, social history and family history with the patient and they are unchanged from previous note.  ALLERGIES:  is allergic to morphine and related.  MEDICATIONS:  Current Outpatient Prescriptions  Medication Sig Dispense Refill  . amLODipine (NORVASC) 5 MG tablet Take 5 mg by mouth daily.     . Armodafinil 250 MG tablet Take 250 mg by mouth daily.    Marland Kitchen dexamethasone (DECADRON) 4 MG tablet Take 1 tablet (4 mg total) by mouth 2 (two) times daily. 60 tablet 0  . diphenhydrAMINE (BENADRYL) 25 mg capsule Take 25 mg by mouth every 6 (six) hours as needed for itching.    Marland Kitchen emollient (BIAFINE) cream Apply topically as needed.    . famotidine (PEPCID) 20 MG tablet Take 20 mg by mouth 2 (two) times daily.    Marland Kitchen gabapentin (NEURONTIN) 800 MG tablet Take 2,400 mg by mouth 2 (two) times daily.    Marland Kitchen HYDROmorphone (DILAUDID) 4 MG tablet Take 1 tablet (4 mg total) by mouth every 4 (four) hours as needed for severe pain. 60 tablet 0  . ibuprofen (ADVIL,MOTRIN) 200 MG tablet Take 400 mg by mouth every 6 (six) hours as needed for mild pain or moderate pain.    Marland Kitchen lansoprazole (PREVACID) 30 MG capsule Take 30 mg by mouth daily at 12 noon.    . lidocaine (LIDODERM) 5 % Place 1 patch onto the skin daily. Remove & Discard patch within 12 hours or as directed by MD 30 patch 0  . lidocaine (XYLOCAINE) 2 % solution Patient: Mix 1part 2% viscous lidocaine, 1part H20. Swish and/or swallow 66mL of this mixture, 61min before meals and at bedtime, up  to QID 100 mL 4  . lidocaine-prilocaine (EMLA) cream Apply 1 application topically as needed. To Port a cath site one hour prior to needle stick 30 g 3  . oxymetazoline (AFRIN) 0.05 % nasal spray Place 2 sprays into both nostrils daily as needed for congestion.    . polyethylene glycol powder (GLYCOLAX/MIRALAX) powder Take 17 g by mouth 2 (two) times daily. 255 g 0  .  sucralfate (CARAFATE) 1 G tablet Dissolve 1 tablet in 10 mL H20 and swallow up to QID as needed for sore throat 40 tablet 3  . traZODone (DESYREL) 100 MG tablet Take 100 mg by mouth at bedtime.     No current facility-administered medications for this visit.    PHYSICAL EXAMINATION: ECOG PERFORMANCE STATUS: 0 - Asymptomatic  Filed Vitals:   05/28/15 1353  BP: 151/76  Pulse: 77  Temp: 98.2 F (36.8 C)  Resp: 18   Filed Weights   05/28/15 1353  Weight: 247 lb 8 oz (112.265 kg)    GENERAL:alert, no distress and comfortable SKIN: skin color, texture, turgor are normal, no rashes or significant lesions EYES: normal, Conjunctiva are pink and non-injected, sclera clear OROPHARYNX:no exudate, no erythema and lips, buccal mucosa, and tongue normal  NECK: supple, thyroid normal size, non-tender, without nodularity LYMPH:  No lymphadenopathy in the left side of his neck is getting smaller.  LUNGS: clear to auscultation and percussion with normal breathing effort HEART: regular rate & rhythm and no murmurs and no lower extremity edema ABDOMEN:abdomen soft, non-tender and normal bowel sounds Musculoskeletal:no cyanosis of digits and no clubbing  NEURO: alert & oriented x 3 with fluent speech, no focal motor/sensory deficits  LABORATORY DATA:  I have reviewed the data as listed    Component Value Date/Time   NA 137 05/28/2015 1456   NA 136 05/03/2015 2344   K 4.1 05/28/2015 1456   K 4.2 05/03/2015 2344   CL 103 05/03/2015 2344   CO2 31* 05/28/2015 1456   CO2 23 05/03/2015 2344   GLUCOSE 120 05/28/2015 1456   GLUCOSE 113* 05/03/2015 2344   BUN 22.2 05/28/2015 1456   BUN 20 05/03/2015 2344   CREATININE 1.1 05/28/2015 1456   CREATININE 1.11 05/03/2015 2344   CALCIUM 9.6 05/28/2015 1456   CALCIUM 9.1 05/03/2015 2344   PROT 7.0 05/28/2015 1456   PROT 7.9 11/20/2014 1115   ALBUMIN 3.6 05/28/2015 1456   ALBUMIN 4.1 11/20/2014 1115   AST 15 05/28/2015 1456   AST 22 11/20/2014 1115    ALT 21 05/28/2015 1456   ALT 22 11/20/2014 1115   ALKPHOS 125 05/28/2015 1456   ALKPHOS 117 11/20/2014 1115   BILITOT 0.41 05/28/2015 1456   BILITOT 0.4 11/20/2014 1115   GFRNONAA >60 05/03/2015 2344   GFRAA >60 05/03/2015 2344    No results found for: SPEP, UPEP  Lab Results  Component Value Date   WBC 10.0 05/09/2015   NEUTROABS 8.2* 05/09/2015   HGB 13.0 05/09/2015   HCT 38.8* 05/09/2015   MCV 85.1 05/09/2015   PLT 287 05/09/2015      Chemistry      Component Value Date/Time   NA 137 05/28/2015 1456   NA 136 05/03/2015 2344   K 4.1 05/28/2015 1456   K 4.2 05/03/2015 2344   CL 103 05/03/2015 2344   CO2 31* 05/28/2015 1456   CO2 23 05/03/2015 2344   BUN 22.2 05/28/2015 1456   BUN 20 05/03/2015 2344   CREATININE 1.1 05/28/2015 1456  CREATININE 1.11 05/03/2015 2344      Component Value Date/Time   CALCIUM 9.6 05/28/2015 1456   CALCIUM 9.1 05/03/2015 2344   ALKPHOS 125 05/28/2015 1456   ALKPHOS 117 11/20/2014 1115   AST 15 05/28/2015 1456   AST 22 11/20/2014 1115   ALT 21 05/28/2015 1456   ALT 22 11/20/2014 1115   BILITOT 0.41 05/28/2015 1456   BILITOT 0.4 11/20/2014 1115     ASSESSMENT & PLAN:  Tongue cancer He had completed all radiation therapy and is doing well. He is eager to start systemic chemotherapy. Intention of treatment is palliative.  The decision was made based on publication at the [facility]. It is a category 1 recommendation from NCCN. Vermorken, et al. Alison Stalling J Med [phone]  The chemotherapy consists of   1. Carboplatin (at an area under the curve of 5 mg per milliliter per minute, as a 1-hour intravenous infusion on day 1) plus  2. Fluorouracil (at a dose of 1000 mg per square meter per day for 4 days) every 3 weeks for a maximum of 6 cycles  3. Cetuximab (at a dose of 400 mg per square meter initially, as a 2-hour intravenous infusion, then 250 mg per square meter, as a 1-hour intravenous infusion per week) for a maximum of 6  cycles.   Patients with stable disease who received chemotherapy plus cetuximab continued to receive cetuximab until disease progression or unacceptable toxic effects, whichever occurred first.  Adding cetuximab to platinum-based chemotherapy with fluorouracil (platinum-fluorouracil) significantly prolonged the median overall survival from 7.4 months in the chemotherapy-alone group to 10.1 months in the group that received chemotherapy plus cetuximab (hazard ratio for death, 0.80; 95% confidence interval, 0.64 to 0.99; P=0.04).   The addition of cetuximab prolonged the median progression-free survival time from 3.3 to 5.6 months (hazard ratio for progression, 0.54; P<0.001) and increased the response rate from 20% to 36% (P<0.001).   There were no cetuximab-related deaths. He agreed with the plan of care. The risks, benefits, side effects of treatment were fully discussed with the patient and his family member and he agreed to proceed.  Chronic neck pain He has chronic neck pain from degenerative arthritis and probably from cancer. I will refill his pain medication prescription. I will continue dexamethasone taper and he will come off it completely by 06/04/2015   Orders Placed This Encounter  Procedures  . Magnesium    Standing Status: Standing     Number of Occurrences: 20     Standing Expiration Date: 05/28/2016  . Comprehensive metabolic panel    Standing Status: Standing     Number of Occurrences: 9     Standing Expiration Date: 05/27/2016  . Comprehensive metabolic panel    Standing Status: Standing     Number of Occurrences: 20     Standing Expiration Date: 05/28/2016    Order Specific Question:  Has the patient fasted?    Answer:  No  . CBC with Differential    Standing Status: Standing     Number of Occurrences: 20     Standing Expiration Date: 05/29/2016   All questions were answered. The patient knows to call the clinic with any problems, questions or concerns. No  barriers to learning was detected. I spent 30 minutes counseling the patient face to face. The total time spent in the appointment was 40 minutes and more than 50% was on counseling and review of test results     [facility], Meagan Spease, MD 05/29/2015  2:12 PM

## 2015-05-31 ENCOUNTER — Telehealth: Payer: Self-pay | Admitting: *Deleted

## 2015-05-31 ENCOUNTER — Other Ambulatory Visit: Payer: Self-pay | Admitting: Hematology and Oncology

## 2015-05-31 MED ORDER — ONDANSETRON HCL 8 MG PO TABS: 8.0000 mg | ORAL_TABLET | Freq: Three times a day (TID) | ORAL | Status: DC | PRN

## 2015-05-31 MED ORDER — PROMETHAZINE HCL 25 MG PO TABS: 25.0000 mg | ORAL_TABLET | Freq: Four times a day (QID) | ORAL | Status: DC | PRN

## 2015-05-31 NOTE — Telephone Encounter (Signed)
Attempted to call pt several times and it sounded like someone answered but then line went dead.  Wanted to send Rx for phenergan and compazine to his pharmacy before he starts chemo tomorrow but unable to reach him.   Rx for phenergan and compazine will be given to pt in chemo tomorrow.

## 2015-06-01 ENCOUNTER — Ambulatory Visit (HOSPITAL_BASED_OUTPATIENT_CLINIC_OR_DEPARTMENT_OTHER): Payer: PPO

## 2015-06-01 ENCOUNTER — Other Ambulatory Visit: Payer: Self-pay | Admitting: Pharmacist

## 2015-06-01 ENCOUNTER — Encounter: Payer: Self-pay | Admitting: *Deleted

## 2015-06-01 VITALS — BP 143/73 | HR 65 | Temp 96.4°F

## 2015-06-01 DIAGNOSIS — Z5112 Encounter for antineoplastic immunotherapy: Secondary | ICD-10-CM

## 2015-06-01 DIAGNOSIS — C01 Malignant neoplasm of base of tongue: Secondary | ICD-10-CM | POA: Diagnosis not present

## 2015-06-01 DIAGNOSIS — Z5111 Encounter for antineoplastic chemotherapy: Secondary | ICD-10-CM

## 2015-06-01 MED ORDER — SODIUM CHLORIDE 0.9 % IV SOLN
Freq: Once | INTRAVENOUS | Status: AC
  Administered 2015-06-01: 10:00:00 via INTRAVENOUS

## 2015-06-01 MED ORDER — DIPHENHYDRAMINE HCL 50 MG/ML IJ SOLN
50.0000 mg | Freq: Once | INTRAMUSCULAR | Status: AC
  Administered 2015-06-01: 50 mg via INTRAVENOUS

## 2015-06-01 MED ORDER — CETUXIMAB CHEMO IV INJECTION 200 MG/100ML
400.0000 mg/m2 | Freq: Once | INTRAVENOUS | Status: AC
  Administered 2015-06-01: 900 mg via INTRAVENOUS
  Filled 2015-06-01: qty 450

## 2015-06-01 MED ORDER — SODIUM CHLORIDE 0.9 % IV SOLN
Freq: Once | INTRAVENOUS | Status: AC
  Administered 2015-06-01: 14:00:00 via INTRAVENOUS
  Filled 2015-06-01: qty 8

## 2015-06-01 MED ORDER — SODIUM CHLORIDE 0.9 % IV SOLN
1000.0000 mg/m2/d | INTRAVENOUS | Status: DC
  Administered 2015-06-01: 9350 mg via INTRAVENOUS
  Filled 2015-06-01: qty 187

## 2015-06-01 MED ORDER — DIPHENHYDRAMINE HCL 50 MG/ML IJ SOLN
INTRAMUSCULAR | Status: AC
  Filled 2015-06-01: qty 1

## 2015-06-01 MED ORDER — SODIUM CHLORIDE 0.9 % IV SOLN
685.0000 mg | Freq: Once | INTRAVENOUS | Status: AC
  Administered 2015-06-01: 690 mg via INTRAVENOUS
  Filled 2015-06-01: qty 69

## 2015-06-01 NOTE — Progress Notes (Signed)
Oncology Nurse Navigator Documentation  Oncology Nurse Navigator Flowsheets 06/01/2015  Referral date to RadOnc/MedOnc -  Navigator Encounter Type Treatment  Patient Visit Type Medonc  Treatment Phase First Chemo Tx  To provide support and encouragement, care continuity and to assess for needs, met with patient in Infusion during his first chemotherapy.  He was accompanied by his dtr, Amy. He reported:  Tolerating procedure without any problems.  In follow-up to dtr's VM this morning, he received Rx for antiemetics today.  He did not express any needs or concerns at this time, I encouraged him to contact me if that changes, he verbalized understanding.  Barriers/Navigation Needs -  Interventions -  Time Spent with Patient [place], RN, BSN, [place] [facility] [phone]

## 2015-06-01 NOTE — Patient Instructions (Signed)
[facility] Discharge Instructions for Patients Receiving Chemotherapy  Today you received the following chemotherapy agents Erbitux/carboplatin/fluorouracil   To help prevent nausea and vomiting after your treatment, we encourage you to take your nausea medication as directed   If you develop nausea and vomiting that is not controlled by your nausea medication, call the clinic.   BELOW ARE SYMPTOMS THAT SHOULD BE REPORTED IMMEDIATELY:  *FEVER GREATER THAN 100.5 F  *CHILLS WITH OR WITHOUT FEVER  NAUSEA AND VOMITING THAT IS NOT CONTROLLED WITH YOUR NAUSEA MEDICATION  *UNUSUAL SHORTNESS OF BREATH  *UNUSUAL BRUISING OR BLEEDING  TENDERNESS IN MOUTH AND THROAT WITH OR WITHOUT PRESENCE OF ULCERS  *URINARY PROBLEMS  *BOWEL PROBLEMS  UNUSUAL RASH Items with * indicate a potential emergency and should be followed up as soon as possible.  Feel free to call the clinic you have any questions or concerns. The clinic phone number is [phone].  Carboplatin injection What is this medicine? CARBOPLATIN (KAR boe pla tin) is a chemotherapy drug. It targets fast dividing cells, like cancer cells, and causes these cells to die. This medicine is used to treat ovarian cancer and many other cancers. This medicine may be used for other purposes; ask your health care provider or pharmacist if you have questions. COMMON BRAND NAME(S): Paraplatin What should I tell my health care provider before I take this medicine? They need to know if you have any of these conditions: -blood disorders -hearing problems -kidney disease -recent or ongoing radiation therapy -an unusual or allergic reaction to carboplatin, cisplatin, other chemotherapy, other medicines, foods, dyes, or preservatives -pregnant or trying to get pregnant -breast-feeding How should I use this medicine? This drug is usually given as an infusion into a vein. It is administered in a hospital or clinic by a specially  trained health care professional. Talk to your pediatrician regarding the use of this medicine in children. Special care may be needed. Overdosage: If you think you have taken too much of this medicine contact a poison control center or emergency room at once. NOTE: This medicine is only for you. Do not share this medicine with others. What if I miss a dose? It is important not to miss a dose. Call your doctor or health care professional if you are unable to keep an appointment. What may interact with this medicine? -medicines for seizures -medicines to increase blood counts like filgrastim, pegfilgrastim, sargramostim -some antibiotics like amikacin, gentamicin, neomycin, streptomycin, tobramycin -vaccines Talk to your doctor or health care professional before taking any of these medicines: -acetaminophen -aspirin -ibuprofen -ketoprofen -naproxen This list may not describe all possible interactions. Give your health care provider a list of all the medicines, herbs, non-prescription drugs, or dietary supplements you use. Also tell them if you smoke, drink alcohol, or use illegal drugs. Some items may interact with your medicine. What should I watch for while using this medicine? Your condition will be monitored carefully while you are receiving this medicine. You will need important blood work done while you are taking this medicine. This drug may make you feel generally unwell. This is not uncommon, as chemotherapy can affect healthy cells as well as cancer cells. Report any side effects. Continue your course of treatment even though you feel ill unless your doctor tells you to stop. In some cases, you may be given additional medicines to help with side effects. Follow all directions for their use. Call your doctor or health care professional for advice if you get  a fever, chills or sore throat, or other symptoms of a cold or flu. Do not treat yourself. This drug decreases your body's ability  to fight infections. Try to avoid being around people who are sick. This medicine may increase your risk to bruise or bleed. Call your doctor or health care professional if you notice any unusual bleeding. Be careful brushing and flossing your teeth or using a toothpick because you may get an infection or bleed more easily. If you have any dental work done, tell your dentist you are receiving this medicine. Avoid taking products that contain aspirin, acetaminophen, ibuprofen, naproxen, or ketoprofen unless instructed by your doctor. These medicines may hide a fever. Do not become pregnant while taking this medicine. Women should inform their doctor if they wish to become pregnant or think they might be pregnant. There is a potential for serious side effects to an unborn child. Talk to your health care professional or pharmacist for more information. Do not breast-feed an infant while taking this medicine. What side effects may I notice from receiving this medicine? Side effects that you should report to your doctor or health care professional as soon as possible: -allergic reactions like skin rash, itching or hives, swelling of the face, lips, or tongue -signs of infection - fever or chills, cough, sore throat, pain or difficulty passing urine -signs of decreased platelets or bleeding - bruising, pinpoint red spots on the skin, black, tarry stools, nosebleeds -signs of decreased red blood cells - unusually weak or tired, fainting spells, lightheadedness -breathing problems -changes in hearing -changes in vision -chest pain -high blood pressure -low blood counts - This drug may decrease the number of white blood cells, red blood cells and platelets. You may be at increased risk for infections and bleeding. -nausea and vomiting -pain, swelling, redness or irritation at the injection site -pain, tingling, numbness in the hands or feet -problems with balance, talking, walking -trouble passing urine  or change in the amount of urine Side effects that usually do not require medical attention (report to your doctor or health care professional if they continue or are bothersome): -hair loss -loss of appetite -metallic taste in the mouth or changes in taste This list may not describe all possible side effects. Call your doctor for medical advice about side effects. You may report side effects to FDA at 1-800-FDA-1088. Where should I keep my medicine? This drug is given in a hospital or clinic and will not be stored at home. NOTE: This sheet is a summary. It may not cover all possible information. If you have questions about this medicine, talk to your doctor, pharmacist, or health care provider.  2015, [employer]. (2008-02-22 14:38:05)  Cetuximab injection What is this medicine? CETUXIMAB (se TUX i mab) is a chemotherapy drug. It targets a specific protein within cancer cells and stops the cells from growing. It is used to treat colorectal cancer and head and neck cancer. This medicine may be used for other purposes; ask your health care provider or pharmacist if you have questions. COMMON BRAND NAME(S): Erbitux What should I tell my health care provider before I take this medicine? They need to know if you have any of these conditions: -heart disease -history of irregular heartbeat -history of low levels of calcium, magnesium, or potassium in the blood -lung or breathing disease, like asthma -an unusual or allergic reaction to cetuximab, other medicines, foods, dyes, or preservatives -pregnant or trying to get pregnant -breast-feeding How should I use  this medicine? This drug is given as an infusion into a vein. It is administered in a hospital or clinic by a specially trained health care professional. Talk to your pediatrician regarding the use of this medicine in children. Special care may be needed. Overdosage: If you think you have taken too much of this medicine contact  a poison control center or emergency room at once. NOTE: This medicine is only for you. Do not share this medicine with others. What if I miss a dose? It is important not to miss your dose. Call your doctor or health care professional if you are unable to keep an appointment. What may interact with this medicine? Interactions are not expected. This list may not describe all possible interactions. Give your health care provider a list of all the medicines, herbs, non-prescription drugs, or dietary supplements you use. Also tell them if you smoke, drink alcohol, or use illegal drugs. Some items may interact with your medicine. What should I watch for while using this medicine? Visit your doctor or health care professional for regular checks on your progress. This drug may make you feel generally unwell. This is not uncommon, as chemotherapy can affect healthy cells as well as cancer cells. Report any side effects. Continue your course of treatment even though you feel ill unless your doctor tells you to stop. This medicine can make you more sensitive to the sun. Keep out of the sun while taking this medicine and for 2 months after the last dose. If you cannot avoid being in the sun, wear protective clothing and use sunscreen. Do not use sun lamps or tanning beds/booths. You may need blood work done while you are taking this medicine. In some cases, you may be given additional medicines to help with side effects. Follow all directions for their use. Call your doctor or health care professional for advice if you get a fever, chills or sore throat, or other symptoms of a cold or flu. Do not treat yourself. This drug decreases your body's ability to fight infections. Try to avoid being around people who are sick. Avoid taking products that contain aspirin, acetaminophen, ibuprofen, naproxen, or ketoprofen unless instructed by your doctor. These medicines may hide a fever. Do not become pregnant while taking  this medicine. Women should inform their doctor if they wish to become pregnant or think they might be pregnant. There is a potential for serious side effects to an unborn child. Use adequate birth control methods. Avoid pregnancy for at least 6 months after your last dose. Talk to your health care professional or pharmacist for more information. Do not breast-feed an infant while taking this medicine or during the 2 months after your last dose. What side effects may I notice from receiving this medicine? Side effects that you should report to your doctor or health care professional as soon as possible: -allergic reactions like skin rash, itching or hives, swelling of the face, lips, or tongue -breathing problems -changes in vision -fast, irregular heartbeat -feeling faint or lightheaded, falls -fever, chills -mouth sores -redness, blistering, peeling or loosening of the skin, including inside the mouth -trouble passing urine or change in the amount of urine -unusually weak or tired Side effects that usually do not require medical attention (report to your doctor or health care professional if they continue or are bothersome): -changes in skin like acne, cracks, skin dryness -constipation -diarrhea -headache -nail changes -nausea, vomiting -stomach upset -weight loss This list may not describe all possible  side effects. Call your doctor for medical advice about side effects. You may report side effects to FDA at 1-800-FDA-1088. Where should I keep my medicine? This drug is given in a hospital or clinic and will not be stored at home. NOTE: This sheet is a summary. It may not cover all possible information. If you have questions about this medicine, talk to your doctor, pharmacist, or health care provider.  2015, [employer]. (2014-03-01 16:14:34)  Fluorouracil, 5-FU injection What is this medicine? FLUOROURACIL, 5-FU (flure oh YOOR a sil) is a chemotherapy drug. It slows the  growth of cancer cells. This medicine is used to treat many types of cancer like breast cancer, colon or rectal cancer, pancreatic cancer, and stomach cancer. This medicine may be used for other purposes; ask your health care provider or pharmacist if you have questions. COMMON BRAND NAME(S): Adrucil What should I tell my health care provider before I take this medicine? They need to know if you have any of these conditions: -blood disorders -dihydropyrimidine dehydrogenase (DPD) deficiency -infection (especially a virus infection such as chickenpox, cold sores, or herpes) -kidney disease -liver disease -malnourished, poor nutrition -recent or ongoing radiation therapy -an unusual or allergic reaction to fluorouracil, other chemotherapy, other medicines, foods, dyes, or preservatives -pregnant or trying to get pregnant -breast-feeding How should I use this medicine? This drug is given as an infusion or injection into a vein. It is administered in a hospital or clinic by a specially trained health care professional. Talk to your pediatrician regarding the use of this medicine in children. Special care may be needed. Overdosage: If you think you have taken too much of this medicine contact a poison control center or emergency room at once. NOTE: This medicine is only for you. Do not share this medicine with others. What if I miss a dose? It is important not to miss your dose. Call your doctor or health care professional if you are unable to keep an appointment. What may interact with this medicine? -allopurinol -cimetidine -dapsone -digoxin -hydroxyurea -leucovorin -levamisole -medicines for seizures like ethotoin, fosphenytoin, phenytoin -medicines to increase blood counts like filgrastim, pegfilgrastim, sargramostim -medicines that treat or prevent blood clots like warfarin, enoxaparin, and dalteparin -methotrexate -metronidazole -pyrimethamine -some other chemotherapy drugs like  busulfan, cisplatin, estramustine, vinblastine -trimethoprim -trimetrexate -vaccines Talk to your doctor or health care professional before taking any of these medicines: -acetaminophen -aspirin -ibuprofen -ketoprofen -naproxen This list may not describe all possible interactions. Give your health care provider a list of all the medicines, herbs, non-prescription drugs, or dietary supplements you use. Also tell them if you smoke, drink alcohol, or use illegal drugs. Some items may interact with your medicine. What should I watch for while using this medicine? Visit your doctor for checks on your progress. This drug may make you feel generally unwell. This is not uncommon, as chemotherapy can affect healthy cells as well as cancer cells. Report any side effects. Continue your course of treatment even though you feel ill unless your doctor tells you to stop. In some cases, you may be given additional medicines to help with side effects. Follow all directions for their use. Call your doctor or health care professional for advice if you get a fever, chills or sore throat, or other symptoms of a cold or flu. Do not treat yourself. This drug decreases your body's ability to fight infections. Try to avoid being around people who are sick. This medicine may increase your risk to bruise or  bleed. Call your doctor or health care professional if you notice any unusual bleeding. Be careful brushing and flossing your teeth or using a toothpick because you may get an infection or bleed more easily. If you have any dental work done, tell your dentist you are receiving this medicine. Avoid taking products that contain aspirin, acetaminophen, ibuprofen, naproxen, or ketoprofen unless instructed by your doctor. These medicines may hide a fever. Do not become pregnant while taking this medicine. Women should inform their doctor if they wish to become pregnant or think they might be pregnant. There is a potential for  serious side effects to an unborn child. Talk to your health care professional or pharmacist for more information. Do not breast-feed an infant while taking this medicine. Men should inform their doctor if they wish to father a child. This medicine may lower sperm counts. Do not treat diarrhea with over the counter products. Contact your doctor if you have diarrhea that lasts more than 2 days or if it is severe and watery. This medicine can make you more sensitive to the sun. Keep out of the sun. If you cannot avoid being in the sun, wear protective clothing and use sunscreen. Do not use sun lamps or tanning beds/booths. What side effects may I notice from receiving this medicine? Side effects that you should report to your doctor or health care professional as soon as possible: -allergic reactions like skin rash, itching or hives, swelling of the face, lips, or tongue -low blood counts - this medicine may decrease the number of white blood cells, red blood cells and platelets. You may be at increased risk for infections and bleeding. -signs of infection - fever or chills, cough, sore throat, pain or difficulty passing urine -signs of decreased platelets or bleeding - bruising, pinpoint red spots on the skin, black, tarry stools, blood in the urine -signs of decreased red blood cells - unusually weak or tired, fainting spells, lightheadedness -breathing problems -changes in vision -chest pain -mouth sores -nausea and vomiting -pain, swelling, redness at site where injected -pain, tingling, numbness in the hands or feet -redness, swelling, or sores on hands or feet -stomach pain -unusual bleeding Side effects that usually do not require medical attention (report to your doctor or health care professional if they continue or are bothersome): -changes in finger or toe nails -diarrhea -dry or itchy skin -hair loss -headache -loss of appetite -sensitivity of eyes to the light -stomach  upset -unusually teary eyes This list may not describe all possible side effects. Call your doctor for medical advice about side effects. You may report side effects to FDA at 1-800-FDA-1088. Where should I keep my medicine? This drug is given in a hospital or clinic and will not be stored at home. NOTE: This sheet is a summary. It may not cover all possible information. If you have questions about this medicine, talk to your doctor, pharmacist, or health care provider.  2015, [employer]. (2008-03-22 13:53:16)

## 2015-06-04 ENCOUNTER — Emergency Department (HOSPITAL_COMMUNITY): Payer: PPO

## 2015-06-04 ENCOUNTER — Observation Stay (HOSPITAL_COMMUNITY): Payer: PPO

## 2015-06-04 ENCOUNTER — Inpatient Hospital Stay (HOSPITAL_COMMUNITY)
Admission: EM | Admit: 2015-06-04 | Discharge: 2015-06-05 | DRG: 864 | Disposition: A | Discharge status: Home Health | Payer: PPO | Attending: Internal Medicine | Admitting: Internal Medicine

## 2015-06-04 ENCOUNTER — Other Ambulatory Visit: Payer: Self-pay | Admitting: Oncology

## 2015-06-04 ENCOUNTER — Encounter (HOSPITAL_COMMUNITY): Payer: Self-pay | Admitting: Emergency Medicine

## 2015-06-04 ENCOUNTER — Other Ambulatory Visit: Payer: Self-pay

## 2015-06-04 DIAGNOSIS — Z87442 Personal history of urinary calculi: Secondary | ICD-10-CM

## 2015-06-04 DIAGNOSIS — R0781 Pleurodynia: Secondary | ICD-10-CM | POA: Diagnosis present

## 2015-06-04 DIAGNOSIS — Z79891 Long term (current) use of opiate analgesic: Secondary | ICD-10-CM

## 2015-06-04 DIAGNOSIS — C7951 Secondary malignant neoplasm of bone: Secondary | ICD-10-CM | POA: Diagnosis present

## 2015-06-04 DIAGNOSIS — C01 Malignant neoplasm of base of tongue: Secondary | ICD-10-CM | POA: Diagnosis not present

## 2015-06-04 DIAGNOSIS — Z8249 Family history of ischemic heart disease and other diseases of the circulatory system: Secondary | ICD-10-CM

## 2015-06-04 DIAGNOSIS — Z808 Family history of malignant neoplasm of other organs or systems: Secondary | ICD-10-CM

## 2015-06-04 DIAGNOSIS — Z79899 Other long term (current) drug therapy: Secondary | ICD-10-CM

## 2015-06-04 DIAGNOSIS — R55 Syncope and collapse: Secondary | ICD-10-CM | POA: Diagnosis present

## 2015-06-04 DIAGNOSIS — D689 Coagulation defect, unspecified: Secondary | ICD-10-CM | POA: Diagnosis not present

## 2015-06-04 DIAGNOSIS — Z791 Long term (current) use of non-steroidal anti-inflammatories (NSAID): Secondary | ICD-10-CM

## 2015-06-04 DIAGNOSIS — I10 Essential (primary) hypertension: Secondary | ICD-10-CM | POA: Diagnosis present

## 2015-06-04 DIAGNOSIS — M542 Cervicalgia: Secondary | ICD-10-CM | POA: Diagnosis present

## 2015-06-04 DIAGNOSIS — T451X5A Adverse effect of antineoplastic and immunosuppressive drugs, initial encounter: Secondary | ICD-10-CM | POA: Diagnosis present

## 2015-06-04 DIAGNOSIS — Z7982 Long term (current) use of aspirin: Secondary | ICD-10-CM

## 2015-06-04 DIAGNOSIS — M1990 Unspecified osteoarthritis, unspecified site: Secondary | ICD-10-CM | POA: Diagnosis present

## 2015-06-04 DIAGNOSIS — K219 Gastro-esophageal reflux disease without esophagitis: Secondary | ICD-10-CM | POA: Diagnosis present

## 2015-06-04 DIAGNOSIS — Z885 Allergy status to narcotic agent status: Secondary | ICD-10-CM

## 2015-06-04 DIAGNOSIS — R509 Fever, unspecified: Secondary | ICD-10-CM | POA: Diagnosis not present

## 2015-06-04 DIAGNOSIS — G8929 Other chronic pain: Secondary | ICD-10-CM | POA: Diagnosis present

## 2015-06-04 DIAGNOSIS — Z7952 Long term (current) use of systemic steroids: Secondary | ICD-10-CM

## 2015-06-04 DIAGNOSIS — R52 Pain, unspecified: Secondary | ICD-10-CM

## 2015-06-04 DIAGNOSIS — W19XXXA Unspecified fall, initial encounter: Secondary | ICD-10-CM | POA: Diagnosis present

## 2015-06-04 DIAGNOSIS — S82409A Unspecified fracture of shaft of unspecified fibula, initial encounter for closed fracture: Secondary | ICD-10-CM | POA: Diagnosis present

## 2015-06-04 DIAGNOSIS — G4730 Sleep apnea, unspecified: Secondary | ICD-10-CM | POA: Diagnosis present

## 2015-06-04 DIAGNOSIS — S82891A Other fracture of right lower leg, initial encounter for closed fracture: Secondary | ICD-10-CM | POA: Diagnosis present

## 2015-06-04 LAB — CBC WITH DIFFERENTIAL/PLATELET
BASOS ABS: 0 10*3/uL (ref 0.0–0.1)
BASOS PCT: 0 % (ref 0–1)
EOS ABS: 0.1 10*3/uL (ref 0.0–0.7)
EOS PCT: 1 % (ref 0–5)
HCT: 40.9 % (ref 39.0–52.0)
Hemoglobin: 13.3 g/dL (ref 13.0–17.0)
Lymphocytes Relative: 4 % — ABNORMAL LOW (ref 12–46)
Lymphs Abs: 0.3 10*3/uL — ABNORMAL LOW (ref 0.7–4.0)
MCH: 28.7 pg (ref 26.0–34.0)
MCHC: 32.5 g/dL (ref 30.0–36.0)
MCV: 88.3 fL (ref 78.0–100.0)
MONOS PCT: 5 % (ref 3–12)
Monocytes Absolute: 0.4 10*3/uL (ref 0.1–1.0)
NEUTROS PCT: 90 % — AB (ref 43–77)
Neutro Abs: 7.5 10*3/uL (ref 1.7–7.7)
Platelets: 190 10*3/uL (ref 150–400)
RBC: 4.63 MIL/uL (ref 4.22–5.81)
RDW: 13.2 % (ref 11.5–15.5)
WBC: 8.3 10*3/uL (ref 4.0–10.5)

## 2015-06-04 LAB — URINALYSIS, ROUTINE W REFLEX MICROSCOPIC
BILIRUBIN URINE: NEGATIVE
GLUCOSE, UA: NEGATIVE mg/dL
Hgb urine dipstick: NEGATIVE
Ketones, ur: NEGATIVE mg/dL
Leukocytes, UA: NEGATIVE
Nitrite: NEGATIVE
PROTEIN: NEGATIVE mg/dL
Specific Gravity, Urine: 1.011 (ref 1.005–1.030)
UROBILINOGEN UA: 0.2 mg/dL (ref 0.0–1.0)
pH: 6.5 (ref 5.0–8.0)

## 2015-06-04 LAB — I-STAT CHEM 8, ED
BUN: 25 mg/dL — ABNORMAL HIGH (ref 6–20)
Calcium, Ion: 1.1 mmol/L — ABNORMAL LOW (ref 1.13–1.30)
Chloride: 95 mmol/L — ABNORMAL LOW (ref 101–111)
Creatinine, Ser: 1.1 mg/dL (ref 0.61–1.24)
GLUCOSE: 180 mg/dL — AB (ref 65–99)
HEMATOCRIT: 43 % (ref 39.0–52.0)
Hemoglobin: 14.6 g/dL (ref 13.0–17.0)
POTASSIUM: 3.4 mmol/L — AB (ref 3.5–5.1)
SODIUM: 135 mmol/L (ref 135–145)
TCO2: 26 mmol/L (ref 0–100)

## 2015-06-04 LAB — PROTIME-INR
INR: >10 (ref 0.00–1.49)
INR: 1.13 (ref 0.00–1.49)
PROTHROMBIN TIME: 14.7 s (ref 11.6–15.2)
Prothrombin Time: >90 seconds — ABNORMAL HIGH (ref 11.6–15.2)

## 2015-06-04 LAB — I-STAT CG4 LACTIC ACID, ED: Lactic Acid, Venous: 2.46 mmol/L (ref 0.5–2.0)

## 2015-06-04 LAB — I-STAT TROPONIN, ED: Troponin i, poc: 0.02 ng/mL (ref 0.00–0.08)

## 2015-06-04 LAB — APTT: aPTT: 26 seconds (ref 24–37)

## 2015-06-04 LAB — CBG MONITORING, ED: Glucose-Capillary: 233 mg/dL — ABNORMAL HIGH (ref 65–99)

## 2015-06-04 MED ORDER — VANCOMYCIN HCL 10 G IV SOLR
2000.0000 mg | Freq: Once | INTRAVENOUS | Status: AC
  Administered 2015-06-04: 2000 mg via INTRAVENOUS
  Filled 2015-06-04 (×2): qty 2000

## 2015-06-04 MED ORDER — HYDROMORPHONE HCL 1 MG/ML IJ SOLN: 1.0000 mg | INTRAMUSCULAR | Status: DC | PRN

## 2015-06-04 MED ORDER — DIPHENHYDRAMINE HCL 25 MG PO CAPS: 25.0000 mg | ORAL_CAPSULE | Freq: Four times a day (QID) | ORAL | Status: DC | PRN

## 2015-06-04 MED ORDER — VANCOMYCIN HCL IN DEXTROSE 1-5 GM/200ML-% IV SOLN
1000.0000 mg | Freq: Once | INTRAVENOUS | Status: DC
  Filled 2015-06-04: qty 200

## 2015-06-04 MED ORDER — SODIUM CHLORIDE 0.9 % IV BOLUS (SEPSIS)
1000.0000 mL | Freq: Once | INTRAVENOUS | Status: AC
  Administered 2015-06-04: 1000 mL via INTRAVENOUS

## 2015-06-04 MED ORDER — GABAPENTIN 800 MG PO TABS: 2400.0000 mg | ORAL_TABLET | Freq: Two times a day (BID) | ORAL | Status: DC

## 2015-06-04 MED ORDER — HYDROMORPHONE HCL 1 MG/ML IJ SOLN: 0.5000 mg | INTRAMUSCULAR | Status: DC | PRN

## 2015-06-04 MED ORDER — PANTOPRAZOLE SODIUM 40 MG PO TBEC
40.0000 mg | DELAYED_RELEASE_TABLET | Freq: Every day | ORAL | Status: DC
  Administered 2015-06-04 – 2015-06-05 (×2): 40 mg via ORAL
  Filled 2015-06-04 (×3): qty 1

## 2015-06-04 MED ORDER — TRAZODONE HCL 100 MG PO TABS
100.0000 mg | ORAL_TABLET | Freq: Every day | ORAL | Status: DC
  Administered 2015-06-04: 100 mg via ORAL
  Filled 2015-06-04 (×2): qty 1

## 2015-06-04 MED ORDER — KETOROLAC TROMETHAMINE 30 MG/ML IJ SOLN
30.0000 mg | Freq: Once | INTRAMUSCULAR | Status: AC
  Administered 2015-06-04: 30 mg via INTRAVENOUS
  Filled 2015-06-04: qty 1

## 2015-06-04 MED ORDER — IBUPROFEN 800 MG PO TABS
800.0000 mg | ORAL_TABLET | Freq: Once | ORAL | Status: AC
  Administered 2015-06-04: 800 mg via ORAL
  Filled 2015-06-04: qty 1

## 2015-06-04 MED ORDER — ONDANSETRON HCL 8 MG PO TABS: 8.0000 mg | ORAL_TABLET | Freq: Three times a day (TID) | ORAL | Status: DC | PRN

## 2015-06-04 MED ORDER — PIPERACILLIN-TAZOBACTAM 3.375 G IVPB
3.3750 g | Freq: Three times a day (TID) | INTRAVENOUS | Status: DC
  Administered 2015-06-04 – 2015-06-05 (×4): 3.375 g via INTRAVENOUS
  Filled 2015-06-04 (×5): qty 50

## 2015-06-04 MED ORDER — FENTANYL CITRATE (PF) 100 MCG/2ML IJ SOLN
50.0000 ug | Freq: Once | INTRAMUSCULAR | Status: AC
  Administered 2015-06-04: 50 ug via INTRAVENOUS
  Filled 2015-06-04: qty 2

## 2015-06-04 MED ORDER — ASPIRIN EC 81 MG PO TBEC
81.0000 mg | DELAYED_RELEASE_TABLET | Freq: Every day | ORAL | Status: DC
  Filled 2015-06-04: qty 1

## 2015-06-04 MED ORDER — POLYETHYLENE GLYCOL 3350 17 GM/SCOOP PO POWD: 17.0000 g | Freq: Two times a day (BID) | ORAL | Status: DC

## 2015-06-04 MED ORDER — VANCOMYCIN HCL IN DEXTROSE 750-5 MG/150ML-% IV SOLN
750.0000 mg | Freq: Two times a day (BID) | INTRAVENOUS | Status: DC
  Administered 2015-06-05 (×2): 750 mg via INTRAVENOUS
  Filled 2015-06-04 (×3): qty 150

## 2015-06-04 MED ORDER — GABAPENTIN 300 MG PO CAPS
600.0000 mg | ORAL_CAPSULE | ORAL | Status: DC
  Administered 2015-06-04 (×2): 600 mg via ORAL
  Filled 2015-06-04 (×4): qty 2

## 2015-06-04 MED ORDER — AMLODIPINE BESYLATE 5 MG PO TABS
5.0000 mg | ORAL_TABLET | Freq: Every day | ORAL | Status: DC
  Administered 2015-06-04 – 2015-06-05 (×2): 5 mg via ORAL
  Filled 2015-06-04 (×3): qty 1

## 2015-06-04 MED ORDER — IBUPROFEN 200 MG PO TABS
400.0000 mg | ORAL_TABLET | Freq: Four times a day (QID) | ORAL | Status: DC | PRN
  Administered 2015-06-04 (×2): 400 mg via ORAL
  Filled 2015-06-04 (×3): qty 2

## 2015-06-04 MED ORDER — LIDOCAINE 5 % EX PTCH
1.0000 | MEDICATED_PATCH | Freq: Every day | CUTANEOUS | Status: DC | PRN
  Filled 2015-06-04: qty 1

## 2015-06-04 MED ORDER — SUCRALFATE 1 G PO TABS
1.0000 g | ORAL_TABLET | Freq: Four times a day (QID) | ORAL | Status: DC | PRN
  Filled 2015-06-04: qty 1

## 2015-06-04 MED ORDER — SODIUM CHLORIDE 0.9 % IV SOLN: INTRAVENOUS | Status: DC

## 2015-06-04 MED ORDER — HYDROMORPHONE HCL 1 MG/ML IJ SOLN
1.0000 mg | INTRAMUSCULAR | Status: DC | PRN
  Administered 2015-06-04 – 2015-06-05 (×7): 1 mg via INTRAVENOUS
  Filled 2015-06-04 (×7): qty 1

## 2015-06-04 MED ORDER — HYDROMORPHONE HCL 2 MG PO TABS: 4.0000 mg | ORAL_TABLET | ORAL | Status: DC | PRN

## 2015-06-04 MED ORDER — PROMETHAZINE HCL 25 MG PO TABS
25.0000 mg | ORAL_TABLET | Freq: Four times a day (QID) | ORAL | Status: DC | PRN
  Administered 2015-06-04 – 2015-06-05 (×2): 25 mg via ORAL
  Filled 2015-06-04 (×2): qty 1

## 2015-06-04 MED ORDER — DEXAMETHASONE 4 MG PO TABS
4.0000 mg | ORAL_TABLET | Freq: Two times a day (BID) | ORAL | Status: DC
  Administered 2015-06-04 – 2015-06-05 (×3): 4 mg via ORAL
  Filled 2015-06-04 (×4): qty 1

## 2015-06-04 MED ORDER — LIDOCAINE VISCOUS 2 % MT SOLN
20.0000 mL | Freq: Four times a day (QID) | OROMUCOSAL | Status: DC | PRN
  Filled 2015-06-04: qty 20

## 2015-06-04 MED ORDER — FLUOROURACIL CHEMO INJECTION 5 GM/100ML
2240.0000 mg | INTRAVENOUS | Status: AC
  Administered 2015-06-04: 2240 mg via INTRAVENOUS
  Filled 2015-06-04: qty 45

## 2015-06-04 MED ORDER — ACETAMINOPHEN 500 MG PO TABS
1000.0000 mg | ORAL_TABLET | Freq: Once | ORAL | Status: AC
  Administered 2015-06-04: 1000 mg via ORAL
  Filled 2015-06-04: qty 2

## 2015-06-04 MED ORDER — MODAFINIL 200 MG PO TABS
200.0000 mg | ORAL_TABLET | Freq: Every day | ORAL | Status: DC
  Administered 2015-06-04 – 2015-06-05 (×2): 200 mg via ORAL
  Filled 2015-06-04 (×2): qty 1

## 2015-06-04 MED ORDER — ARMODAFINIL 250 MG PO TABS: 250.0000 mg | ORAL_TABLET | Freq: Every day | ORAL | Status: DC

## 2015-06-04 MED ORDER — POLYETHYLENE GLYCOL 3350 17 G PO PACK
17.0000 g | PACK | Freq: Two times a day (BID) | ORAL | Status: DC
  Administered 2015-06-04 – 2015-06-05 (×3): 17 g via ORAL
  Filled 2015-06-04 (×7): qty 1

## 2015-06-04 MED ORDER — FAMOTIDINE 20 MG PO TABS
20.0000 mg | ORAL_TABLET | Freq: Two times a day (BID) | ORAL | Status: DC
  Administered 2015-06-04 – 2015-06-05 (×3): 20 mg via ORAL
  Filled 2015-06-04 (×4): qty 1

## 2015-06-04 MED ORDER — PIPERACILLIN-TAZOBACTAM 3.375 G IVPB 30 MIN
3.3750 g | Freq: Once | INTRAVENOUS | Status: DC
  Filled 2015-06-04: qty 50

## 2015-06-04 NOTE — ED Notes (Addendum)
Pt states he attempted to go to [facility] @ 0230, syncopal episode x 3, pt c/o pain to R ankle, R back. Pt is chemo pt, pt currently receiving chemo infusion.

## 2015-06-04 NOTE — Progress Notes (Signed)
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.  He received his first dose of cetuximab, fluorouracil, and carboplatin on 7/1.  Medial survival, per Dr. Calton Dach note is around 10 months.  Fully independent for ADLs at baseline.  He was feeling well the day prior to admission without headache, cough, SOB, nausea, vomiting, diarrhea, or dysuria.  The night prior to admission, he had three syncopal episodes, each preceded by flushing, nausea, and lightheadedness.  He had three falls, the last of which caused an injury to his right ankle.  He also hurt his right flank.  His daughter brought him to the ER where he was found to have a fever to 102F.  CXR and UA were clear.  Blood cultures were obtained and he was started on broad spectrum antibiotics for possible infection.  He does not meet SIRS criteria.  His initial labs were notable for INR > 10.    Plan  Syncope and fever without Sirs criteria and not neutropenic.  Fainting spells sound vasovagal and were likely related to fever and chemotherapy. -  Case discussed with on-call oncologist and continuing chemotherapy for now -  Patient started on vancomycin and Zosyn on 7/4 pending culture results -  Follow-up urine culture, blood cultures  Elevated INR, likely lab error and repeat is normal  Fibular fracture with widened medial ankle mortise consistent with ligamentous injury -  Nonweight bearing right lower extremity -  Elevate -  Ice -  Orthopedics consultation, Dr. 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 Erlinda Hong  Procedures:  CXR: NAD  XR right ankle:  Fibular fracture and evidence of ligamentous injury  XR ribs:  pending  Antibiotics:  vanc 7/4 >  Zosyn 7/4 >   HPI/Subjective:  Has severe pain in ankle and in right ribs.  Denies cough, shortness of breath, nausea with vomiting, diarrhea, dysuria     Objective: Filed Vitals:   06/04/15 0415 06/04/15 0551 06/04/15 0752 06/04/15 0814  BP: 144/78 133/80 110/54 118/64  Pulse: 80 72 66 63  Temp: 102 F (38.9 C) 99.2 F (37.3 C) 99.2 F (37.3 C) 98.3 F (36.8 C)  TempSrc: Oral Oral Oral Oral  Resp: 18 18 18 18   Height:    5\' 9"  (1.753 m)  Weight:    114.306 kg (252 lb)  SpO2: 93% 94% 95% 95%    Intake/Output Summary (Last 24 hours) at 06/04/15 1051 Last data filed at 06/04/15 0944  Gross per 24 hour  Intake    120 ml  Output      0 ml  Net    120 ml   Filed Weights   06/04/15 0401 06/04/15 0814  Weight: 114.306 kg (252 lb) 114.306 kg (252 lb)   Body mass index is 37.2 kg/(m^2).  Exam:   General:   Obese male,No acute distress  HEENT:  NCAT, MMM  Cardiovascular:  RRR, nl S1, S2 no mrg, 2+ pulses, warm extremities  Respiratory:  CTAB, no increased WOB  Abdomen:   NABS, soft, NT/ND  MSK:   Normal tone and bulright ankle is swollen and tender to palpation, decreased range of motion secondary to pain   Neuro:  Grossly intact  Data Reviewed: Basic Metabolic Panel:  Recent Labs Lab 05/28/15 1456 06/04/15 0507  NA 137 135  K 4.1 3.4*  CL  --  95*  CO2 31*  --   GLUCOSE 120 180*  BUN 22.2 25*  CREATININE 1.1 1.10  CALCIUM 9.6  --   MG 2.0  --    Liver Function Tests:  Recent Labs Lab  05/28/15 1456  AST 15  ALT 21  ALKPHOS 125  BILITOT 0.41  PROT 7.0  ALBUMIN 3.6   No results for input(s): LIPASE, AMYLASE in the last 168 hours. No results for input(s): AMMONIA in the last 168 hours. CBC:  Recent Labs Lab 06/04/15 0506 06/04/15 0507  WBC 8.3  --   NEUTROABS 7.5  --   HGB 13.3 14.6  HCT 40.9 43.0  MCV 88.3  --   PLT 190  --     No results found for this or any previous visit (from the past 240 hour(s)).   Studies: Dg Chest 2 View  06/04/2015   CLINICAL DATA:  Syncope and fall  EXAM: CHEST  2 VIEW  COMPARISON:  11/20/2014  FINDINGS: Right IJ porta catheter, tip at the SVC level.  Normal heart size and aortic contours.  There is no edema, consolidation, effusion, or pneumothorax.  No acute fracture.  IMPRESSION: No active cardiopulmonary disease.   Electronically Signed   By: Monte Fantasia M.D.   On: 06/04/2015 04:51   Dg Ankle Complete Right  06/04/2015   CLINICAL DATA:  [place] [address] 3 times at home today, twisted ankle. Bruising and edema about the ankle.  EXAM: RIGHT ANKLE - COMPLETE 3+ VIEW  COMPARISON:  None.  FINDINGS: Acute oblique distal fibular fracture in alignment. Widened medial ankle mortise. Lateral clear space appears intact. No destructive bony lesions. Moderate calcaneal spur partially imaged. Ankle soft tissue swelling without subcutaneous gas or radiopaque foreign bodies.  IMPRESSION: Acute nondisplaced distal fibular fracture.  Widened medial ankle mortise consistent with ligamentous injury. No dislocation.   Electronically Signed   By: Elon Alas M.D.   On: 06/04/2015 06:27   Ct Head Wo Contrast  06/04/2015   CLINICAL DATA:  Syncopal episode, frontal headache. History of head and neck cancer.  EXAM: CT HEAD WITHOUT CONTRAST  TECHNIQUE: Contiguous axial images were obtained from the base of the skull through the vertex without intravenous contrast.  COMPARISON:  None.  FINDINGS: The ventricles and sulci are normal for age. No intraparenchymal  hemorrhage, mass effect nor midline shift. No acute large vascular territory infarcts.  No abnormal extra-axial fluid collections. Basal cisterns are patent.  No skull fracture. The included ocular globes and orbital contents are non-suspicious. RIGHT maxillary sinus mucosal thickening with bony wall thickening consistent with chronic sinusitis, small RIGHT maxillary mucous retention cyst. The mastoid air cells are well aerated.  IMPRESSION: No acute intracranial process; normal noncontrast CT head for age.   Electronically Signed   By: Elon Alas M.D.   On: 06/04/2015 04:56    Scheduled Meds: . amLODipine  5 mg Oral Daily  . dexamethasone  4 mg Oral BID  . famotidine  20 mg Oral BID  . gabapentin  600 mg Oral 2 times per day  . modafinil  200 mg Oral Daily  . pantoprazole  40 mg Oral Daily  . piperacillin-tazobactam  3.375 g Intravenous Once  . piperacillin-tazobactam (ZOSYN)  IV  3.375 g Intravenous Q8H  . polyethylene glycol  17 g Oral BID  . traZODone  100 mg Oral QHS  . vancomycin  2,000 mg Intravenous Once  . vancomycin  750 mg Intravenous Q12H   Continuous Infusions:    Principal Problem:   Syncope Active Problems:   Carcinoma of base of tongue   Fever   Coagulopathy   Fibula fracture    Time spent: 30 min    Arjuna Doeden, [facility] Pager [phone]. If 7PM-7AM, please contact night-coverage at [email or web address], password [facility] 06/04/2015, 10:51 AM

## 2015-06-04 NOTE — ED Provider Notes (Signed)
CSN: [id number]     Arrival date & time 06/04/15  0357 History   First MD Initiated Contact with Patient 06/04/15 0400     Chief Complaint  Patient presents with  . Loss of Consciousness     (Consider location/radiation/quality/duration/timing/severity/associated sxs/prior Treatment) Patient is a 59 y.o. male presenting with syncope. The history is provided by the patient. No language interpreter was used.  Loss of Consciousness Episode history:  Multiple Most recent episode:  Today Timing:  Rare Progression:  Unchanged Chronicity:  New Context: standing up   Context: not blood draw   Witnessed: no   Relieved by:  Nothing Worsened by:  Nothing tried Ineffective treatments:  None tried Associated symptoms: nausea   Associated symptoms: no anxiety, no chest pain, no confusion, no diaphoresis, no focal weakness, no headaches, no rectal bleeding, no seizures and no vomiting   Risk factors: no congenital heart disease     Past Medical History  Diagnosis Date  . Chronic pain   . Back pain   . Chronic neck pain   . History of kidney stones   . Allergic rhinitis   . Hypertension   . Migraine headache without aura   . Sleep apnea     does not use cpap  . GERD (gastroesophageal reflux disease)   . Neuropathy   . Arthritis   . Cancer      may 2016 tongue cancer   Past Surgical History  Procedure Laterality Date  . Back surgery  03/1993, 10/2002    Dr. Louanne Skye  . Tonsillectomy      as a child  . Neck surgery  2004    Dr. Louanne Skye  . Lymph node biopsy    . Nasal sinusotomy  1977  . Colonoscopy    . Multiple extractions with alveoloplasty N/A 04/26/2015    Procedure: Extraction of tooth #'s 6,11,12,15,20,21,22,27,28 with alveoloplasty;  Surgeon: [name], DDS;  Location: [place];  Service: Oral Surgery;  Laterality: N/A;   Family History  Problem Relation Age of Onset  . Cancer Father     throat ca  . Cancer Brother     pituitary ca  . Heart disease Mother     History  Substance Use Topics  . Smoking status: Never Smoker   . Smokeless tobacco: Never Used  . Alcohol Use: Yes     Comment: once a month    Review of Systems  Constitutional: Negative for diaphoresis.  Cardiovascular: Positive for syncope. Negative for chest pain.  Gastrointestinal: Positive for nausea and constipation. Negative for vomiting and diarrhea.  Musculoskeletal: Negative for gait problem.  Skin: Negative for rash.  Neurological: Positive for syncope. Negative for focal weakness, seizures, facial asymmetry, speech difficulty and headaches.  Psychiatric/Behavioral: Negative for confusion.  All other systems reviewed and are negative.     Allergies  Morphine and related  Home Medications   Prior to Admission medications   Medication Sig Start Date End Date Taking? Authorizing Provider  amLODipine (NORVASC) 5 MG tablet Take 5 mg by mouth daily.    Yes Historical Provider, MD  Armodafinil 250 MG tablet Take 250 mg by mouth daily.   Yes Historical Provider, MD  aspirin EC 81 MG tablet Take 81 mg by mouth daily.   Yes Historical Provider, MD  dexamethasone (DECADRON) 4 MG tablet Take 1 tablet (4 mg total) by mouth 2 (two) times daily. 05/21/15  Yes [name], MD  diphenhydrAMINE (BENADRYL) 25 mg capsule Take 25 mg by  mouth every 6 (six) hours as needed for itching.   Yes Historical Provider, MD  emollient (BIAFINE) cream Apply 1 application topically 3 (three) times daily as needed (pain).    Yes Historical Provider, MD  famotidine (PEPCID) 20 MG tablet Take 20 mg by mouth 2 (two) times daily.   Yes Historical Provider, MD  gabapentin (NEURONTIN) 800 MG tablet Take 2,400 mg by mouth 2 (two) times daily.   Yes Historical Provider, MD  HYDROmorphone (DILAUDID) 4 MG tablet Take 1 tablet (4 mg total) by mouth every 4 (four) hours as needed for severe pain. 05/28/15  Yes [name], MD  ibuprofen (ADVIL,MOTRIN) 200 MG tablet Take 400 mg by mouth every 6 (six) hours as  needed for mild pain or moderate pain.   Yes Historical Provider, MD  lansoprazole (PREVACID) 30 MG capsule Take 30 mg by mouth daily at 12 noon.   Yes Historical Provider, MD  lidocaine (LIDODERM) 5 % Place 1 patch onto the skin daily. Remove & Discard patch within 12 hours or as directed by MD Patient taking differently: Place 1 patch onto the skin daily as needed (pain). Remove & Discard patch within 12 hours or as directed by MD 05/16/15  Yes [name], MD  lidocaine (XYLOCAINE) 2 % solution Patient: Mix 1part 2% viscous lidocaine, 1part H20. Swish and/or swallow 70mL of this mixture, 61min before meals and at bedtime, up to QID Patient taking differently: Use as directed 20 mLs in the mouth or throat every 6 (six) hours as needed. Patient: Mix 1part 2% viscous lidocaine, 1part H20. Swish and/or swallow 40mL of this mixture, 32min before meals and at bedtime, up to QID 05/14/15  Yes Eppie Gibson, MD  lidocaine-prilocaine (EMLA) cream Apply 1 application topically as needed. To Port a cath site one hour prior to needle stick 05/09/15  Yes [name], MD  Misc. Devices (PUMP IN STYLE ADVANCED) MISC Inject 1 cartridge into the vein See admin instructions. Adrucil 9350 mg  2.81ml/hr. Runs for 96 hours   Yes Historical Provider, MD  ondansetron (ZOFRAN) 8 MG tablet Take 1 tablet (8 mg total) by mouth every 8 (eight) hours as needed for nausea. 05/31/15  Yes [name], MD  oxymetazoline (AFRIN) 0.05 % nasal spray Place 2 sprays into both nostrils daily as needed for congestion.   Yes Historical Provider, MD  polyethylene glycol powder (GLYCOLAX/MIRALAX) powder Take 17 g by mouth 2 (two) times daily. 04/06/13  Yes Veryl Speak, MD  promethazine (PHENERGAN) 25 MG tablet Take 1 tablet (25 mg total) by mouth every 6 (six) hours as needed for nausea. 05/31/15  Yes [name], MD  sucralfate (CARAFATE) 1 G tablet Dissolve 1 tablet in 10 mL H20 and swallow up to QID as needed for sore throat Patient taking differently:  Take 1 g by mouth 4 (four) times daily as needed (sore throat). Dissolve 1 tablet in 10 mL H20 and swallow up to QID as needed for sore throat 05/14/15  Yes Eppie Gibson, MD  traZODone (DESYREL) 100 MG tablet Take 100 mg by mouth at bedtime.   Yes Historical Provider, MD   BP 144/78 mmHg  Pulse 80  Temp(Src) 102 F (38.9 C) (Oral)  Resp 18  Ht 5\' 9"  (1.753 m)  Wt 252 lb (114.306 kg)  BMI 37.20 kg/m2  SpO2 93% Physical Exam  Constitutional: He is oriented to person, place, and time. He appears well-developed and well-nourished.  HENT:  Head: Normocephalic and atraumatic.  Mouth/Throat: Oropharynx is  clear and moist. No oropharyngeal exudate.  Eyes: Conjunctivae and EOM are normal. Pupils are equal, round, and reactive to light.  Neck: Normal range of motion. Neck supple.  Cardiovascular: Normal rate, regular rhythm and intact distal pulses.   Pulmonary/Chest: Effort normal and breath sounds normal. No respiratory distress. He has no wheezes. He has no rales.  Abdominal: Soft. Bowel sounds are normal. There is no tenderness. There is no rebound and no guarding.  Musculoskeletal: Normal range of motion.       Right ankle: He exhibits no laceration. No AITFL, no CF ligament, no posterior TFL, no head of 5th metatarsal and no proximal fibula tenderness found. Achilles tendon normal.  Neurological: He is alert and oriented to person, place, and time. He has normal reflexes.  Skin: Skin is warm and dry. He is not diaphoretic.  Psychiatric: He has a normal mood and affect.    ED Course  Procedures (including critical care time) Labs Review Labs Reviewed  CBC WITH DIFFERENTIAL/PLATELET - Abnormal; Notable for the following:    Neutrophils Relative % 90 (*)    Lymphocytes Relative 4 (*)    Lymphs Abs 0.3 (*)    All other components within normal limits  I-STAT CHEM 8, ED - Abnormal; Notable for the following:    Potassium 3.4 (*)    Chloride 95 (*)    BUN 25 (*)    Glucose, Bld 180 (*)     Calcium, Ion 1.10 (*)    All other components within normal limits  CBG MONITORING, ED - Abnormal; Notable for the following:    Glucose-Capillary 233 (*)    All other components within normal limits  CULTURE, BLOOD (ROUTINE X 2)  CULTURE, BLOOD (ROUTINE X 2)  URINE CULTURE  PROTIME-INR  URINALYSIS, ROUTINE W REFLEX MICROSCOPIC (NOT AT [facility])  I-STAT TROPOININ, ED  I-STAT CG4 LACTIC ACID, ED    Imaging Review Dg Chest 2 View  06/04/2015   CLINICAL DATA:  Syncope and fall  EXAM: CHEST  2 VIEW  COMPARISON:  11/20/2014  FINDINGS: Right IJ porta catheter, tip at the SVC level.  Normal heart size and aortic contours.  There is no edema, consolidation, effusion, or pneumothorax.  No acute fracture.  IMPRESSION: No active cardiopulmonary disease.   Electronically Signed   By: Monte Fantasia M.D.   On: 06/04/2015 04:51   Ct Head Wo Contrast  06/04/2015   CLINICAL DATA:  Syncopal episode, frontal headache. History of head and neck cancer.  EXAM: CT HEAD WITHOUT CONTRAST  TECHNIQUE: Contiguous axial images were obtained from the base of the skull through the vertex without intravenous contrast.  COMPARISON:  None.  FINDINGS: The ventricles and sulci are normal for age. No intraparenchymal hemorrhage, mass effect nor midline shift. No acute large vascular territory infarcts.  No abnormal extra-axial fluid collections. Basal cisterns are patent.  No skull fracture. The included ocular globes and orbital contents are non-suspicious. RIGHT maxillary sinus mucosal thickening with bony wall thickening consistent with chronic sinusitis, small RIGHT maxillary mucous retention cyst. The mastoid air cells are well aerated.  IMPRESSION: No acute intracranial process; normal noncontrast CT head for age.   Electronically Signed   By: Elon Alas M.D.   On: 06/04/2015 04:56     EKG Interpretation   Date/Time:  Monday June 04 2015 04:10:36 EDT Ventricular Rate:  79 PR Interval:  179 QRS Duration: 167 QT  Interval:  421 QTC Calculation: 483 R Axis:   -59 Text Interpretation:  Sinus rhythm Ventricular premature complex RBBB and  LAFB Left ventricular hypertrophy Confirmed by [facility]  MD, Emmaline Kluver  ([id number]) on 06/04/2015 4:53:47 AM      MDM   Final diagnoses:  Pain aggravated by activities of daily living    Case d/w Dr. Marin Olp, antibiotics and admission do not hold the cehmotherapy   The patient appears reasonably stabilized for admission considering the current resources, flow, and capabilities available in the ED at this time, and I doubt any other [facility] requiring further screening and/or treatment in the ED prior to admission.  MDM Number of Diagnoses or Management Options Pain aggravated by activities of daily living:  Syncope and collapse:  Critical Care Total time providing critical care: 30-74 minutes MDM Reviewed: previous chart, nursing note and vitals Reviewed previous: labs Interpretation: labs, ECG and x-ray (elevated INR normal troponin normal CT of the head and chest XRAY) Total time providing critical care: 30-74 minutes. This excludes time spent performing separately reportable procedures and services. Consults: admitting MD (oncology)    CRITICAL CARE Performed by: Carlisle Beers Total critical care time: 90 minutes Critical care time was exclusive of separately billable procedures and treating other patients. Critical care was necessary to treat or prevent imminent or life-threatening deterioration. Critical care was time spent personally by me on the following activities: development of treatment plan with patient and/or surrogate as well as nursing, discussions with consultants, evaluation of patient's response to treatment, examination of patient, obtaining history from patient or surrogate, ordering and performing treatments and interventions, ordering and review of laboratory studies, ordering and review of radiographic studies, pulse oximetry and  re-evaluation of patient's condition.  Veatrice Kells, MD 06/04/15 [phone]

## 2015-06-04 NOTE — H&P (Addendum)
[facility] History and Physical  Shane Cantu VCB:[id number] DOB: 01-18-1956 DOA: 06/04/2015  Referring physician: EDP PCP: [name], MD   Chief Complaint: Syncope   HPI: Shane Cantu is a 59 y.o. 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  Patient presents to ED after 3 syncopal episodes today.  Occurred while standing up after having BM.  No N/V/D.  Does have ankle pain due to injury in fall, as well as chronic back pain, exacerbated from fall.  Review of Systems: Systems reviewed.  As above, otherwise negative  Past Medical History  Diagnosis Date  . Chronic pain   . Back pain   . Chronic neck pain   . History of kidney stones   . Allergic rhinitis   . Hypertension   . Migraine headache without aura   . Sleep apnea     does not use cpap  . GERD (gastroesophageal reflux disease)   . Neuropathy   . Arthritis   . Cancer      may 2016 tongue cancer   Past Surgical History  Procedure Laterality Date  . Back surgery  03/1993, 10/2002    Dr. Louanne Skye  . Tonsillectomy      as a child  . Neck surgery  2004    Dr. Louanne Skye  . Lymph node biopsy    . Nasal sinusotomy  1977  . Colonoscopy    . Multiple extractions with alveoloplasty N/A 04/26/2015    Procedure: Extraction of tooth #'s 6,11,12,15,20,21,22,27,28 with alveoloplasty;  Surgeon: [name], DDS;  Location: [facility];  Service: Oral Surgery;  Laterality: N/A;   Social History:  reports that he has never smoked. He has never used smokeless tobacco. He reports that he drinks alcohol. He reports that he does not use illicit drugs.  Allergies  Allergen Reactions  . Morphine And Related Anaphylaxis    Family History  Problem Relation Age of Onset  . Cancer Father     throat ca  . Cancer Brother     pituitary ca  . Heart disease Mother      Prior to Admission medications   Medication Sig Start Date End Date Taking? Authorizing Provider  amLODipine (NORVASC) 5 MG tablet Take 5 mg by mouth daily.    Yes  Historical Provider, MD  Armodafinil 250 MG tablet Take 250 mg by mouth daily.   Yes Historical Provider, MD  aspirin EC 81 MG tablet Take 81 mg by mouth daily.   Yes Historical Provider, MD  dexamethasone (DECADRON) 4 MG tablet Take 1 tablet (4 mg total) by mouth 2 (two) times daily. 05/21/15  Yes [name], MD  diphenhydrAMINE (BENADRYL) 25 mg capsule Take 25 mg by mouth every 6 (six) hours as needed for itching.   Yes Historical Provider, MD  emollient (BIAFINE) cream Apply 1 application topically 3 (three) times daily as needed (pain).    Yes Historical Provider, MD  famotidine (PEPCID) 20 MG tablet Take 20 mg by mouth 2 (two) times daily.   Yes Historical Provider, MD  gabapentin (NEURONTIN) 800 MG tablet Take 2,400 mg by mouth 2 (two) times daily.   Yes Historical Provider, MD  HYDROmorphone (DILAUDID) 4 MG tablet Take 1 tablet (4 mg total) by mouth every 4 (four) hours as needed for severe pain. 05/28/15  Yes [name], MD  ibuprofen (ADVIL,MOTRIN) 200 MG tablet Take 400 mg by mouth every 6 (six) hours as needed for mild pain or moderate pain.   Yes  Historical Provider, MD  lansoprazole (PREVACID) 30 MG capsule Take 30 mg by mouth daily at 12 noon.   Yes Historical Provider, MD  lidocaine (LIDODERM) 5 % Place 1 patch onto the skin daily. Remove & Discard patch within 12 hours or as directed by MD Patient taking differently: Place 1 patch onto the skin daily as needed (pain). Remove & Discard patch within 12 hours or as directed by MD 05/16/15  Yes [name], MD  lidocaine (XYLOCAINE) 2 % solution Patient: Mix 1part 2% viscous lidocaine, 1part H20. Swish and/or swallow 26mL of this mixture, 72min before meals and at bedtime, up to QID Patient taking differently: Use as directed 20 mLs in the mouth or throat every 6 (six) hours as needed. Patient: Mix 1part 2% viscous lidocaine, 1part H20. Swish and/or swallow 3mL of this mixture, 58min before meals and at bedtime, up to QID 05/14/15  Yes Eppie Gibson, MD  lidocaine-prilocaine (EMLA) cream Apply 1 application topically as needed. To Port a cath site one hour prior to needle stick 05/09/15  Yes [name], MD  Misc. Devices (PUMP IN STYLE ADVANCED) MISC Inject 1 cartridge into the vein See admin instructions. Adrucil 9350 mg  2.36ml/hr. Runs for 96 hours   Yes Historical Provider, MD  ondansetron (ZOFRAN) 8 MG tablet Take 1 tablet (8 mg total) by mouth every 8 (eight) hours as needed for nausea. 05/31/15  Yes [name], MD  oxymetazoline (AFRIN) 0.05 % nasal spray Place 2 sprays into both nostrils daily as needed for congestion.   Yes Historical Provider, MD  polyethylene glycol powder (GLYCOLAX/MIRALAX) powder Take 17 g by mouth 2 (two) times daily. 04/06/13  Yes Veryl Speak, MD  promethazine (PHENERGAN) 25 MG tablet Take 1 tablet (25 mg total) by mouth every 6 (six) hours as needed for nausea. 05/31/15  Yes [name], MD  sucralfate (CARAFATE) 1 G tablet Dissolve 1 tablet in 10 mL H20 and swallow up to QID as needed for sore throat Patient taking differently: Take 1 g by mouth 4 (four) times daily as needed (sore throat). Dissolve 1 tablet in 10 mL H20 and swallow up to QID as needed for sore throat 05/14/15  Yes Eppie Gibson, MD  traZODone (DESYREL) 100 MG tablet Take 100 mg by mouth at bedtime.   Yes Historical Provider, MD   Physical Exam: Filed Vitals:   06/04/15 0551  BP: 133/80  Pulse: 72  Temp: 99.2 F (37.3 C)  Resp: 18    BP 133/80 mmHg  Pulse 72  Temp(Src) 99.2 F (37.3 C) (Oral)  Resp 18  Ht 5\' 9"  (1.753 m)  Wt 114.306 kg (252 lb)  BMI 37.20 kg/m2  SpO2 94%  General Appearance:    Alert, oriented, no distress, appears stated age  Head:    Normocephalic, atraumatic  Eyes:    PERRL, EOMI, sclera non-icteric        Nose:   Nares without drainage or epistaxis. Mucosa, turbinates normal  Throat:   Moist mucous membranes. Oropharynx without erythema or exudate.  Neck:   Supple. No carotid bruits.  No thyromegaly.  No  lymphadenopathy.   Back:     No CVA tenderness, no spinal tenderness  Lungs:     Clear to auscultation bilaterally, without wheezes, rhonchi or rales  Chest wall:    No tenderness to palpitation  Heart:    Regular rate and rhythm without murmurs, gallops, rubs  Abdomen:     Soft, non-tender, nondistended, normal bowel sounds,  no organomegaly  Genitalia:    deferred  Rectal:    deferred  Extremities:   No clubbing, cyanosis or edema.  Pulses:   2+ and symmetric all extremities  Skin:   Skin color, texture, turgor normal, no rashes or lesions  Lymph nodes:   Cervical, supraclavicular, and axillary nodes normal  Neurologic:   CNII-XII intact. Normal strength, sensation and reflexes      throughout    Labs on Admission:  Basic Metabolic Panel:  Recent Labs Lab 05/28/15 1456 06/04/15 0507  NA 137 135  K 4.1 3.4*  CL  --  95*  CO2 31*  --   GLUCOSE 120 180*  BUN 22.2 25*  CREATININE 1.1 1.10  CALCIUM 9.6  --   MG 2.0  --    Liver Function Tests:  Recent Labs Lab 05/28/15 1456  AST 15  ALT 21  ALKPHOS 125  BILITOT 0.41  PROT 7.0  ALBUMIN 3.6   No results for input(s): LIPASE, AMYLASE in the last 168 hours. No results for input(s): AMMONIA in the last 168 hours. CBC:  Recent Labs Lab 06/04/15 0506 06/04/15 0507  WBC 8.3  --   NEUTROABS 7.5  --   HGB 13.3 14.6  HCT 40.9 43.0  MCV 88.3  --   PLT 190  --    Cardiac Enzymes: No results for input(s): CKTOTAL, CKMB, CKMBINDEX, TROPONINI in the last 168 hours.  BNP (last 3 results) No results for input(s): PROBNP in the last 8760 hours. CBG:  Recent Labs Lab 06/04/15 0413  GLUCAP 233*    Radiological Exams on Admission: Dg Chest 2 View  06/04/2015   CLINICAL DATA:  Syncope and fall  EXAM: CHEST  2 VIEW  COMPARISON:  11/20/2014  FINDINGS: Right IJ porta catheter, tip at the SVC level.  Normal heart size and aortic contours.  There is no edema, consolidation, effusion, or pneumothorax.  No acute fracture.   IMPRESSION: No active cardiopulmonary disease.   Electronically Signed   By: Monte Fantasia M.D.   On: 06/04/2015 04:51   Ct Head Wo Contrast  06/04/2015   CLINICAL DATA:  Syncopal episode, frontal headache. History of head and neck cancer.  EXAM: CT HEAD WITHOUT CONTRAST  TECHNIQUE: Contiguous axial images were obtained from the base of the skull through the vertex without intravenous contrast.  COMPARISON:  None.  FINDINGS: The ventricles and sulci are normal for age. No intraparenchymal hemorrhage, mass effect nor midline shift. No acute large vascular territory infarcts.  No abnormal extra-axial fluid collections. Basal cisterns are patent.  No skull fracture. The included ocular globes and orbital contents are non-suspicious. RIGHT maxillary sinus mucosal thickening with bony wall thickening consistent with chronic sinusitis, small RIGHT maxillary mucous retention cyst. The mastoid air cells are well aerated.  IMPRESSION: No acute intracranial process; normal noncontrast CT head for age.   Electronically Signed   By: Elon Alas M.D.   On: 06/04/2015 04:56    EKG: Independently reviewed.  Assessment/Plan Principal Problem:   Syncope Active Problems:   Carcinoma of base of tongue   Fever   Coagulopathy   Fibula fracture   1. Syncope and fever in patient with chemotherapy - does not have neutropenia 1. Dr. Marin Olp called by EDP, per his instructions: 1. Leave patient on chemo pump 2. Continue zosyn / Vanc for now 3. They will evaluate on floor 2. Will put patient on IVF for gentle hydration 2. Coagulopathy - 1. Re checking INR per  Dr. Antonieta Pert instructions 3. Fibula fracture - 1. Cam walker ordered 2. Pain control with dilaudid, cautiously as patient would be a very difficult intubation due to CA (d/w patient and wife, patient has had much higher doses of dilaudid in the past than the 1mg  I am giving)   Dr. Marin Olp consulted by EDP, instructions left as above.  Code Status:  Full  Family Communication: Wife at bedside Disposition Plan: Admit to obs   Time spent: 70 min  Tarek Cravens M. [facility] Pager [phone]  If 7AM-7PM, please contact the day team taking care of the patient [email or web address] Password [facility] 06/04/2015, 6:29 AM

## 2015-06-04 NOTE — Consult Note (Signed)
ORTHOPAEDIC CONSULTATION  REQUESTING PHYSICIAN: Etta Quill, DO  Chief Complaint: right ankle injury  HPI: Shane Cantu is a 59 y.o. male who presents with right ankle fx s/p syncopal fall.  Positive syncope.  Denies CP, SOB.  Patient has metastatic tongue cancer.  Has moderate pain in right ankle worse with weight bearing.  Does not radiate.  Throbbing pain.  Ortho consulted.  Denies f/c/n/v.  Past Medical History  Diagnosis Date  . Chronic pain   . Back pain   . Chronic neck pain   . History of kidney stones   . Allergic rhinitis   . Hypertension   . Migraine headache without aura   . Sleep apnea     does not use cpap  . GERD (gastroesophageal reflux disease)   . Neuropathy   . Arthritis   . Cancer      may 2016 tongue cancer   Past Surgical History  Procedure Laterality Date  . Back surgery  03/1993, 10/2002    Dr. Louanne Skye  . Tonsillectomy      as a child  . Neck surgery  2004    Dr. Louanne Skye  . Lymph node biopsy    . Nasal sinusotomy  1977  . Colonoscopy    . Multiple extractions with alveoloplasty N/A 04/26/2015    Procedure: Extraction of tooth #'s 6,11,12,15,20,21,22,27,28 with alveoloplasty;  Surgeon: [name], DDS;  Location: [facility];  Service: Oral Surgery;  Laterality: N/A;   History   Social History  . Marital Status: Divorced    Spouse Name: N/A  . Number of Children: 2  . Years of Education: N/A   Social History Main Topics  . Smoking status: Never Smoker   . Smokeless tobacco: Never Used  . Alcohol Use: Yes     Comment: once a month  . Drug Use: No  . Sexual Activity: No   Other Topics Concern  . None   Social History Narrative   The patient is divorced with 2 children.   Patient is disabled. Patient previously worked with Pharmacologist.   Patient lives in [place].   Patient has never smoked. Patient has never used smokeless tobacco.   Patient with occasional use of alcohol.      Family History  Problem Relation Age of  Onset  . Cancer Father     throat ca  . Cancer Brother     pituitary ca  . Heart disease Mother    Allergies  Allergen Reactions  . Morphine And Related Anaphylaxis   Prior to Admission medications   Medication Sig Start Date End Date Taking? Authorizing Provider  amLODipine (NORVASC) 5 MG tablet Take 5 mg by mouth daily.    Yes Historical Provider, MD  Armodafinil 250 MG tablet Take 250 mg by mouth daily.   Yes Historical Provider, MD  aspirin EC 81 MG tablet Take 81 mg by mouth daily.   Yes Historical Provider, MD  dexamethasone (DECADRON) 4 MG tablet Take 1 tablet (4 mg total) by mouth 2 (two) times daily. 05/21/15  Yes [name], MD  diphenhydrAMINE (BENADRYL) 25 mg capsule Take 25 mg by mouth every 6 (six) hours as needed for itching.   Yes Historical Provider, MD  emollient (BIAFINE) cream Apply 1 application topically 3 (three) times daily as needed (pain).    Yes Historical Provider, MD  famotidine (PEPCID) 20 MG tablet Take 20 mg by mouth 2 (two) times daily.   Yes Historical Provider,  MD  gabapentin (NEURONTIN) 100 MG capsule Take 600 mg by mouth 2 (two) times daily. Takes with supper and at bedtime   Yes Historical Provider, MD  HYDROmorphone (DILAUDID) 4 MG tablet Take 1 tablet (4 mg total) by mouth every 4 (four) hours as needed for severe pain. 05/28/15  Yes [name], MD  ibuprofen (ADVIL,MOTRIN) 200 MG tablet Take 400 mg by mouth every 6 (six) hours as needed for mild pain or moderate pain.   Yes Historical Provider, MD  lansoprazole (PREVACID) 30 MG capsule Take 30 mg by mouth daily at 12 noon.   Yes Historical Provider, MD  lidocaine (LIDODERM) 5 % Place 1 patch onto the skin daily. Remove & Discard patch within 12 hours or as directed by MD Patient taking differently: Place 1 patch onto the skin daily as needed (pain). Remove & Discard patch within 12 hours or as directed by MD 05/16/15  Yes [name], MD  lidocaine (XYLOCAINE) 2 % solution Patient: Mix 1part 2% viscous  lidocaine, 1part H20. Swish and/or swallow 30mL of this mixture, 56min before meals and at bedtime, up to QID Patient taking differently: Use as directed 20 mLs in the mouth or throat every 6 (six) hours as needed. Patient: Mix 1part 2% viscous lidocaine, 1part H20. Swish and/or swallow 74mL of this mixture, 72min before meals and at bedtime, up to QID 05/14/15  Yes Eppie Gibson, MD  lidocaine-prilocaine (EMLA) cream Apply 1 application topically as needed. To Port a cath site one hour prior to needle stick 05/09/15  Yes [name], MD  Misc. Devices (PUMP IN STYLE ADVANCED) MISC Inject 1 cartridge into the vein See admin instructions. Adrucil 9350 mg  2.108ml/hr. Runs for 96 hours   Yes Historical Provider, MD  ondansetron (ZOFRAN) 8 MG tablet Take 1 tablet (8 mg total) by mouth every 8 (eight) hours as needed for nausea. 05/31/15  Yes [name], MD  oxymetazoline (AFRIN) 0.05 % nasal spray Place 2 sprays into both nostrils daily as needed for congestion.   Yes Historical Provider, MD  polyethylene glycol powder (GLYCOLAX/MIRALAX) powder Take 17 g by mouth 2 (two) times daily. 04/06/13  Yes Veryl Speak, MD  promethazine (PHENERGAN) 25 MG tablet Take 1 tablet (25 mg total) by mouth every 6 (six) hours as needed for nausea. 05/31/15  Yes [name], MD  sucralfate (CARAFATE) 1 G tablet Dissolve 1 tablet in 10 mL H20 and swallow up to QID as needed for sore throat Patient taking differently: Take 1 g by mouth 4 (four) times daily as needed (sore throat). Dissolve 1 tablet in 10 mL H20 and swallow up to QID as needed for sore throat 05/14/15  Yes Eppie Gibson, MD  traZODone (DESYREL) 100 MG tablet Take 100 mg by mouth at bedtime.   Yes Historical Provider, MD   Dg Chest 2 View  06/04/2015   CLINICAL DATA:  Syncope and fall  EXAM: CHEST  2 VIEW  COMPARISON:  11/20/2014  FINDINGS: Right IJ porta catheter, tip at the SVC level.  Normal heart size and aortic contours.  There is no edema, consolidation, effusion, or  pneumothorax.  No acute fracture.  IMPRESSION: No active cardiopulmonary disease.   Electronically Signed   By: Monte Fantasia M.D.   On: 06/04/2015 04:51   Dg Ribs Unilateral Right  06/04/2015   CLINICAL DATA:  Rib pain on right side. Fall 3 times yesterday due to lightheadedness. Pain in right posterior lower ribs.  EXAM: RIGHT RIBS - 2  VIEW  COMPARISON:  06/04/2015  FINDINGS: Right Port-A-Cath in place with the tip in the SVC. Right lung is clear. No effusions or pneumothorax. No visible rib fracture.  IMPRESSION: No acute findings.   Electronically Signed   By: Rolm Baptise M.D.   On: 06/04/2015 13:18   Dg Ankle Complete Right  06/04/2015   CLINICAL DATA:  [place] 3 times at home today, twisted ankle. Bruising and edema about the ankle.  EXAM: RIGHT ANKLE - COMPLETE 3+ VIEW  COMPARISON:  None.  FINDINGS: Acute oblique distal fibular fracture in alignment. Widened medial ankle mortise. Lateral clear space appears intact. No destructive bony lesions. Moderate calcaneal spur partially imaged. Ankle soft tissue swelling without subcutaneous gas or radiopaque foreign bodies.  IMPRESSION: Acute nondisplaced distal fibular fracture.  Widened medial ankle mortise consistent with ligamentous injury. No dislocation.   Electronically Signed   By: Elon Alas M.D.   On: 06/04/2015 06:27   Ct Head Wo Contrast  06/04/2015   CLINICAL DATA:  Syncopal episode, frontal headache. History of head and neck cancer.  EXAM: CT HEAD WITHOUT CONTRAST  TECHNIQUE: Contiguous axial images were obtained from the base of the skull through the vertex without intravenous contrast.  COMPARISON:  None.  FINDINGS: The ventricles and sulci are normal for age. No intraparenchymal hemorrhage, mass effect nor midline shift. No acute large vascular territory infarcts.  No abnormal extra-axial fluid collections. Basal cisterns are patent.  No skull fracture. The included ocular globes and orbital contents are non-suspicious. RIGHT maxillary  sinus mucosal thickening with bony wall thickening consistent with chronic sinusitis, small RIGHT maxillary mucous retention cyst. The mastoid air cells are well aerated.  IMPRESSION: No acute intracranial process; normal noncontrast CT head for age.   Electronically Signed   By: Elon Alas M.D.   On: 06/04/2015 04:56    Positive ROS: All other systems have been reviewed and were otherwise negative with the exception of those mentioned in the HPI and as above.  Physical Exam: General: Alert, no acute distress Cardiovascular: No pedal edema Respiratory: No cyanosis, no use of accessory musculature GI: No organomegaly, abdomen is soft and non-tender Skin: No lesions in the area of chief complaint Neurologic: Sensation intact distally Psychiatric: Patient is competent for consent with normal mood and affect Lymphatic: No axillary or cervical lymphadenopathy  MUSCULOSKELETAL:  - mild swelling of ankle - foot wwp - NVI  Assessment: Right weber B ankle fx  Plan: - patient's prognosis is poor with survival of 1-2 years - currently undergoing chemo, has finished radiation - will attempt nonop treatment in fx and TDWB - PT to eval for safety - f/u in office in 1 week  Thank you for the consult and the opportunity to see Mr. Aldape  N. Eduard Roux, MD [place] 2:37 PM     t

## 2015-06-04 NOTE — Progress Notes (Signed)
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  I was contacted because nursing cannot use the pt's CADD pump on the floor. The remaining infusion needs to be changed to a drip. He is currently AF, w stable vitals. OK to complete therapy for this cycle. Will arrange w pharmacy.

## 2015-06-04 NOTE — Progress Notes (Signed)
5 FU dosage calculation verified with Laural Benes, RN.

## 2015-06-04 NOTE — Progress Notes (Signed)
Patient's CADD pump infusion of 5FU stopped and continuous infusion via alaris pump initiated per hospital policy.  Un-infused chemo and CADD pump placed in chemotherapy biohazard bag and returned to pharmacy for disposal. Pharmacy staff to return CADD pump to cancer center.

## 2015-06-04 NOTE — Progress Notes (Signed)
ANTIBIOTIC CONSULT NOTE - INITIAL  Pharmacy Consult for Vancomycin & Zosyn Indication: Fever     Allergies  Allergen Reactions  . Morphine And Related Anaphylaxis    Patient Measurements: Height: 5\' 9"  (175.3 cm) Weight: 252 lb (114.306 kg) IBW/kg (Calculated) : 70.7  Vital Signs: Temp: 99.2 F (37.3 C) (07/04 0551) Temp Source: Oral (07/04 0551) BP: 133/80 mmHg (07/04 0551) Pulse Rate: 72 (07/04 0551) Intake/Output from previous day:   Intake/Output from this shift:    Labs:  Recent Labs  06/04/15 0506 06/04/15 0507  WBC 8.3  --   HGB 13.3 14.6  PLT 190  --   CREATININE  --  1.10   Estimated Creatinine Clearance: 87.9 mL/min (by C-G formula based on Cr of 1.1). No results for input(s): VANCOTROUGH, VANCOPEAK, VANCORANDOM, GENTTROUGH, GENTPEAK, GENTRANDOM, TOBRATROUGH, TOBRAPEAK, TOBRARND, AMIKACINPEAK, AMIKACINTROU, AMIKACIN in the last 72 hours.   Microbiology: No results found for this or any previous visit (from the past 720 hour(s)).  Medical History: Past Medical History  Diagnosis Date  . Chronic pain   . Back pain   . Chronic neck pain   . History of kidney stones   . Allergic rhinitis   . Hypertension   . Migraine headache without aura   . Sleep apnea     does not use cpap  . GERD (gastroesophageal reflux disease)   . Neuropathy   . Arthritis   . Cancer      may 2016 tongue cancer    Medications:  Scheduled:  . amLODipine  5 mg Oral Daily  . Armodafinil  250 mg Oral Daily  . dexamethasone  4 mg Oral BID  . famotidine  20 mg Oral BID  . gabapentin  2,400 mg Oral BID  . pantoprazole  40 mg Oral Daily  . polyethylene glycol  17 g Oral BID  . traZODone  100 mg Oral QHS   Infusions:  . sodium chloride    . piperacillin-tazobactam    . vancomycin     Assessment:  59 yr male with metastatic tongue cancer to the spine presents to ED with syncope. Started chemotherapy on 7/1  Tmax = 102F  Pharmacy consulted to dose Vancomycin and  Zosyn for treatment of fever in a patient undergoing chemotherapy (patient is not neutropenic)  CrCl (n) = 71 ml/min  Blood and urine cultures ordered  Goal of Therapy:  Vancomycin trough level 15-20 mcg/ml  Plan:  Measure antibiotic drug levels at steady state Follow up culture results   Vancomycin 2gm IV x 2 then 750mg  IV q12h  Zosyn 3.375gm IV q8h (each dose infused over 4 hrs)  Rasheka Denard, Toribio Harbour, PharmD 06/04/2015,6:41 AM

## 2015-06-05 ENCOUNTER — Encounter: Payer: Self-pay | Admitting: Hematology and Oncology

## 2015-06-05 DIAGNOSIS — G8929 Other chronic pain: Secondary | ICD-10-CM | POA: Diagnosis present

## 2015-06-05 DIAGNOSIS — Z7982 Long term (current) use of aspirin: Secondary | ICD-10-CM | POA: Diagnosis not present

## 2015-06-05 DIAGNOSIS — K219 Gastro-esophageal reflux disease without esophagitis: Secondary | ICD-10-CM | POA: Diagnosis present

## 2015-06-05 DIAGNOSIS — T451X5A Adverse effect of antineoplastic and immunosuppressive drugs, initial encounter: Secondary | ICD-10-CM | POA: Diagnosis present

## 2015-06-05 DIAGNOSIS — C7951 Secondary malignant neoplasm of bone: Secondary | ICD-10-CM | POA: Diagnosis present

## 2015-06-05 DIAGNOSIS — S82891A Other fracture of right lower leg, initial encounter for closed fracture: Secondary | ICD-10-CM | POA: Diagnosis present

## 2015-06-05 DIAGNOSIS — M1990 Unspecified osteoarthritis, unspecified site: Secondary | ICD-10-CM | POA: Diagnosis present

## 2015-06-05 DIAGNOSIS — R0781 Pleurodynia: Secondary | ICD-10-CM | POA: Diagnosis present

## 2015-06-05 DIAGNOSIS — Z808 Family history of malignant neoplasm of other organs or systems: Secondary | ICD-10-CM | POA: Diagnosis not present

## 2015-06-05 DIAGNOSIS — Z87442 Personal history of urinary calculi: Secondary | ICD-10-CM | POA: Diagnosis not present

## 2015-06-05 DIAGNOSIS — Z791 Long term (current) use of non-steroidal anti-inflammatories (NSAID): Secondary | ICD-10-CM | POA: Diagnosis not present

## 2015-06-05 DIAGNOSIS — Z7952 Long term (current) use of systemic steroids: Secondary | ICD-10-CM | POA: Diagnosis not present

## 2015-06-05 DIAGNOSIS — I10 Essential (primary) hypertension: Secondary | ICD-10-CM | POA: Diagnosis present

## 2015-06-05 DIAGNOSIS — R55 Syncope and collapse: Secondary | ICD-10-CM | POA: Diagnosis present

## 2015-06-05 DIAGNOSIS — Z79891 Long term (current) use of opiate analgesic: Secondary | ICD-10-CM | POA: Diagnosis not present

## 2015-06-05 DIAGNOSIS — C01 Malignant neoplasm of base of tongue: Secondary | ICD-10-CM | POA: Diagnosis present

## 2015-06-05 DIAGNOSIS — D689 Coagulation defect, unspecified: Secondary | ICD-10-CM | POA: Diagnosis present

## 2015-06-05 DIAGNOSIS — Z79899 Other long term (current) drug therapy: Secondary | ICD-10-CM | POA: Diagnosis not present

## 2015-06-05 DIAGNOSIS — Z885 Allergy status to narcotic agent status: Secondary | ICD-10-CM | POA: Diagnosis not present

## 2015-06-05 DIAGNOSIS — G4730 Sleep apnea, unspecified: Secondary | ICD-10-CM | POA: Diagnosis present

## 2015-06-05 DIAGNOSIS — M542 Cervicalgia: Secondary | ICD-10-CM | POA: Diagnosis present

## 2015-06-05 DIAGNOSIS — R509 Fever, unspecified: Secondary | ICD-10-CM | POA: Diagnosis present

## 2015-06-05 DIAGNOSIS — Z8249 Family history of ischemic heart disease and other diseases of the circulatory system: Secondary | ICD-10-CM | POA: Diagnosis not present

## 2015-06-05 DIAGNOSIS — W19XXXA Unspecified fall, initial encounter: Secondary | ICD-10-CM | POA: Diagnosis present

## 2015-06-05 LAB — BASIC METABOLIC PANEL
Anion gap: 7 (ref 5–15)
BUN: 22 mg/dL — AB (ref 6–20)
CO2: 30 mmol/L (ref 22–32)
Calcium: 8.8 mg/dL — ABNORMAL LOW (ref 8.9–10.3)
Chloride: 101 mmol/L (ref 101–111)
Creatinine, Ser: 1.2 mg/dL (ref 0.61–1.24)
GLUCOSE: 194 mg/dL — AB (ref 65–99)
POTASSIUM: 4.5 mmol/L (ref 3.5–5.1)
SODIUM: 138 mmol/L (ref 135–145)

## 2015-06-05 LAB — URINE CULTURE: Culture: NO GROWTH

## 2015-06-05 LAB — CBC
HEMATOCRIT: 38.7 % — AB (ref 39.0–52.0)
Hemoglobin: 12.3 g/dL — ABNORMAL LOW (ref 13.0–17.0)
MCH: 28.2 pg (ref 26.0–34.0)
MCHC: 31.8 g/dL (ref 30.0–36.0)
MCV: 88.8 fL (ref 78.0–100.0)
PLATELETS: 188 10*3/uL (ref 150–400)
RBC: 4.36 MIL/uL (ref 4.22–5.81)
RDW: 13.2 % (ref 11.5–15.5)
WBC: 8.4 10*3/uL (ref 4.0–10.5)

## 2015-06-05 MED ORDER — HEPARIN SOD (PORK) LOCK FLUSH 100 UNIT/ML IV SOLN
500.0000 [IU] | INTRAVENOUS | Status: AC | PRN
  Administered 2015-06-05: 500 [IU]
  Filled 2015-06-05: qty 5

## 2015-06-05 NOTE — Care Management Note (Signed)
Case Management Note  Patient Details  Name: Shane Cantu MRN: [id number] Date of Birth: 1956/03/23  Subjective/Objective:     59 yo admitted with Syncope               Action/Plan: From home  Expected Discharge Date:   (unknown)               Expected Discharge Plan:  Home/Self Care  In-House Referral:     Discharge planning Services  CM Consult  Post Acute Care Choice:    Choice offered to:     DME Arranged:  Walker rolling, Lightweight manual wheelchair with seat cushion DME Agency:  [place]:    [place]:     Status of Service:  In process, will continue to follow  Medicare Important Message Given:    Date Medicare IM Given:    Medicare IM give by:    Date Additional Medicare IM Given:    Additional Medicare Important Message give by:     If discussed at [place] of Stay Meetings, dates discussed:    Additional Comments: Orders DME: Rolling walker and wheelchair. Pt is unsure at this time if he would like HHPT. He is going to speak to is sister about it and let me know if so. Referral for DME given to [facility] DME rep. Pt would like DME delivered to his home as it could potentially take awhile and pt would like to DC soon.  Lynnell Catalan, RN 06/05/2015, 3:24 PM

## 2015-06-05 NOTE — Discharge Summary (Signed)
Physician Discharge Summary  Shane Cantu QKM:[id number] DOB: [birth year] DOA: 06/04/2015  PCP: [name], MD  Admit date: 06/04/2015 Discharge date: 06/05/2015  Recommendations for Outpatient Follow-up:  1. F/u with primary oncologist next week for reevaluation 2. HH PT and wheelchair arranged 3. F/u with Dr. 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. He received his first dose of cetuximab, fluorouracil, and carboplatin on 7/1. Medial survival, per Dr. Calton Dach note is around 10 months. Fully independent for ADLs at baseline. He was feeling well the day prior to admission without headache, cough, SOB, nausea, vomiting, diarrhea, or dysuria. The night prior to admission, he had three syncopal episodes, each preceded by flushing, nausea, and lightheadedness. He had three falls, the last of which caused an injury to his right ankle. He also hurt his right flank. His daughter brought him to the ER where he was found to have a fever to 102F. CXR and UA were clear. Blood cultures were obtained and he was started on broad spectrum antibiotics for possible infection. He does not meet SIRS criteria. His initial labs were notable for INR > 10.   Hospital Course:   Syncope and fever without SIRS criteria and not neutropenic. Fainting spells sound vasovagal and were likely related to fever and chemotherapy.  Case  discussed with on-call oncologist and he continued his chemotherapy infusion which completed on 7/5 prior to discharge.  He was started on empiric vancomycin and Zosyn on 7/4 and blood cultures were NGTD at time of discharge.  Urine culture also pending.  He was not neutropenic and there was no obvious source of infection, therefore, he was not discharged on empiric antibiotics and was advised that he would be called if any of his cultures became positive and required treatment with antibiotics.    Elevated INR, likely lab error or related to concurrent fluorouracil infusion and repeat was normal  Fibular fracture with widened medial ankle mortise consistent with ligamentous injury.  Touch down weight bearing right lower extremity.  He was seen by orthopedics MD Dr. Erlinda Hong who recommended elevation, ice, CAM boot.  He will follow up with Dr. Erlinda Hong in 1 week for reevaluation.  Metastatic tongue cancer on palliative chemotherapy, PPS 100%.  Continued chemotherapy per oncology recommendations.    Hypertension, blood pressure stable, continued amlodipine  GERD, stable, continued PPI and carafate  Right posterior rib pain without obvious fractures on rib XR, however, clinically I suspect he has an occult fracture or severely bruised rib.  He is not splinting.    Consultants:  Oncology  Orthopedic surgery, Dr. Erlinda Hong  Procedures:  CXR: NAD  XR right ankle: Fibular fracture and evidence of ligamentous injury  XR right ribs: no rib fractures  Antibiotics:  vanc 7/4 >  Zosyn 7/4 >  Discharge Exam: Filed Vitals:   06/05/15 1324  BP: 138/79  Pulse:   Temp:  Resp:    Filed Vitals:   06/05/15 0520 06/05/15 1322 06/05/15 1323 06/05/15 1324  BP: 129/77 145/76 140/87 138/79  Pulse: 57 64    Temp: 97.5 F (36.4 C) 97.8 F (36.6 C)    TempSrc: Oral Oral    Resp: 18 18    Height:      Weight:      SpO2: 97% 97%       General: Obese male,No acute distress  HEENT: NCAT,  MMM  Cardiovascular: RRR, nl S1, S2 no mrg, 2+ pulses, warm extremities  Respiratory: CTAB, no increased WOB  Abdomen: NABS, soft, NT/ND  MSK: Normal tone and bulright ankle is swollen and tender to palpation, decreased range of motion secondary to pain   Neuro: Grossly intact  Discharge Instructions      Discharge Instructions    Call MD for:  difficulty breathing, headache or visual disturbances    Complete by:  As directed      Call MD for:  extreme fatigue    Complete by:  As directed      Call MD for:  hives    Complete by:  As directed      Call MD for:  persistant dizziness or light-headedness    Complete by:  As directed      Call MD for:  persistant nausea and vomiting    Complete by:  As directed      Call MD for:  severe uncontrolled pain    Complete by:  As directed      Call MD for:  temperature >100.4    Complete by:  As directed      Diet general    Complete by:  As directed      Discharge instructions    Complete by:  As directed   Touchdown weight-bearing on your right foot while wearing boot.  If you have recurrent fevers or if you have difficulty breathing, nausea, vomiting, diarrhea, dehydration or other concerning symptoms, please return to the hospital right away or call 911.     Increase activity slowly    Complete by:  As directed             Medication List    TAKE these medications        amLODipine 5 MG tablet  Commonly known as:  NORVASC  Take 5 mg by mouth daily.     Armodafinil 250 MG tablet  Take 250 mg by mouth daily.     aspirin EC 81 MG tablet  Take 81 mg by mouth daily.     dexamethasone 4 MG tablet  Commonly known as:  DECADRON  Take 1 tablet (4 mg total) by mouth 2 (two) times daily.     diphenhydrAMINE 25 mg capsule  Commonly known as:  BENADRYL  Take 25 mg by mouth every 6 (six) hours as needed for itching.     emollient cream  Commonly known as:  BIAFINE  Apply 1 application topically 3 (three) times  daily as needed (pain).     famotidine 20 MG tablet  Commonly known as:  PEPCID  Take 20 mg by mouth 2 (two) times daily.     gabapentin 100 MG capsule  Commonly known as:  NEURONTIN  Take 600 mg by mouth 2 (two) times daily. Takes with supper and at bedtime     HYDROmorphone 4 MG tablet  Commonly known as:  DILAUDID  Take 1 tablet (4 mg total) by mouth every  4 (four) hours as needed for severe pain.     ibuprofen 200 MG tablet  Commonly known as:  ADVIL,MOTRIN  Take 400 mg by mouth every 6 (six) hours as needed for mild pain or moderate pain.     lansoprazole 30 MG capsule  Commonly known as:  PREVACID  Take 30 mg by mouth daily at 12 noon.     lidocaine 2 % solution  Commonly known as:  XYLOCAINE  Patient: Mix 1part 2% viscous lidocaine, 1part H20. Swish and/or swallow 38mL of this mixture, 23min before meals and at bedtime, up to QID     lidocaine 5 %  Commonly known as:  LIDODERM  Place 1 patch onto the skin daily. Remove & Discard patch within 12 hours or as directed by MD     lidocaine-prilocaine cream  Commonly known as:  EMLA  Apply 1 application topically as needed. To Port a cath site one hour prior to needle stick     ondansetron 8 MG tablet  Commonly known as:  ZOFRAN  Take 1 tablet (8 mg total) by mouth every 8 (eight) hours as needed for nausea.     oxymetazoline 0.05 % nasal spray  Commonly known as:  AFRIN  Place 2 sprays into both nostrils daily as needed for congestion.     polyethylene glycol powder powder  Commonly known as:  GLYCOLAX/MIRALAX  Take 17 g by mouth 2 (two) times daily.     promethazine 25 MG tablet  Commonly known as:  PHENERGAN  Take 1 tablet (25 mg total) by mouth every 6 (six) hours as needed for nausea.     PUMP IN STYLE ADVANCED Misc  Inject 1 cartridge into the vein See admin instructions. Adrucil 9350 mg  2.3ml/hr. Runs for 96 hours     sucralfate 1 G tablet  Commonly known as:  CARAFATE  Dissolve 1 tablet in 10 mL H20  and swallow up to QID as needed for sore throat     traZODone 100 MG tablet  Commonly known as:  DESYREL  Take 100 mg by mouth at bedtime.       Follow-up Information    Follow up with Marianna Payment, MD In 1 week.   Specialty:  Orthopedic Surgery   Contact information:   [place] [address] [phone]       Schedule an appointment as soon as possible for a visit with [name], MD.   Specialty:  Family Medicine   Why:  As needed   Contact information:   [address] [address] [phone]       Follow up with [facility], NI, MD. Schedule an appointment as soon as possible for a visit in 1 week.   Specialty:  Hematology and Oncology   Contact information:   [place] [address] [phone]        The results of significant diagnostics from this hospitalization (including imaging, microbiology, ancillary and laboratory) are listed below for reference.    Significant Diagnostic Studies: Dg Chest 2 View  06/04/2015   CLINICAL DATA:  Syncope and fall  EXAM: CHEST  2 VIEW  COMPARISON:  11/20/2014  FINDINGS: Right IJ porta catheter, tip at the SVC level.  Normal heart size and aortic contours.  There is no edema, consolidation, effusion, or pneumothorax.  No acute fracture.  IMPRESSION: No active cardiopulmonary disease.   Electronically Signed   By: Neva Seat.D.  On: 06/04/2015 04:51   Dg Ribs Unilateral Right  06/04/2015   CLINICAL DATA:  Rib pain on right side. Fall 3 times yesterday due to lightheadedness. Pain in right posterior lower ribs.  EXAM: RIGHT RIBS - 2 VIEW  COMPARISON:  06/04/2015  FINDINGS: Right Port-A-Cath in place with the tip in the SVC. Right lung is clear. No effusions or pneumothorax. No visible rib fracture.  IMPRESSION: No acute findings.   Electronically Signed   By: Rolm Baptise M.D.   On: 06/04/2015 13:18   Dg Ankle Complete Right  06/04/2015   CLINICAL DATA:  [place] 3 times  at home today, twisted ankle. Bruising and edema about the ankle.  EXAM: RIGHT ANKLE - COMPLETE 3+ VIEW  COMPARISON:  None.  FINDINGS: Acute oblique distal fibular fracture in alignment. Widened medial ankle mortise. Lateral clear space appears intact. No destructive bony lesions. Moderate calcaneal spur partially imaged. Ankle soft tissue swelling without subcutaneous gas or radiopaque foreign bodies.  IMPRESSION: Acute nondisplaced distal fibular fracture.  Widened medial ankle mortise consistent with ligamentous injury. No dislocation.   Electronically Signed   By: Elon Alas M.D.   On: 06/04/2015 06:27   Ct Head Wo Contrast  06/04/2015   CLINICAL DATA:  Syncopal episode, frontal headache. History of head and neck cancer.  EXAM: CT HEAD WITHOUT CONTRAST  TECHNIQUE: Contiguous axial images were obtained from the base of the skull through the vertex without intravenous contrast.  COMPARISON:  None.  FINDINGS: The ventricles and sulci are normal for age. No intraparenchymal hemorrhage, mass effect nor midline shift. No acute large vascular territory infarcts.  No abnormal extra-axial fluid collections. Basal cisterns are patent.  No skull fracture. The included ocular globes and orbital contents are non-suspicious. RIGHT maxillary sinus mucosal thickening with bony wall thickening consistent with chronic sinusitis, small RIGHT maxillary mucous retention cyst. The mastoid air cells are well aerated.  IMPRESSION: No acute intracranial process; normal noncontrast CT head for age.   Electronically Signed   By: Elon Alas M.D.   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  EXAM: IR RIGHT FLOURO GUIDE CV LINE; IR ULTRASOUND GUIDANCE VASC ACCESS RIGHT  Date: 05/09/2015  ANESTHESIA/SEDATION: Moderate (conscious) sedation was administered during this  procedure. A total of 1.5 mg Versed and 75 mg Fentanyl were administered intravenously. The patient's vital signs were monitored continuously by radiology nursing throughout the course of the procedure.  Total sedation time: 21 minutes  FLUOROSCOPY TIME:  12 seconds  7 mGy  TECHNIQUE: The right neck and chest was prepped with chlorhexidine, and draped in the usual sterile fashion using maximum barrier technique (cap and mask, sterile gown, sterile gloves, large sterile sheet, hand hygiene and cutaneous antiseptic). Antibiotic prophylaxis was provided with 2g Ancef administered IV one hour prior to skin incision. Local anesthesia was attained by infiltration with 1% lidocaine with epinephrine.  Ultrasound demonstrated patency of the right internal jugular vein, and this was documented with an image. Under real-time ultrasound guidance, this vein was accessed with a 21 gauge micropuncture needle and image documentation was performed. A small dermatotomy was made at the access site with an 11 scalpel. A 0.018" wire was advanced into the SVC and the access needle exchanged for a 38F micropuncture vascular sheath. The 0.018" wire was then removed and a 0.035"  wire advanced into the IVC.  An appropriate location for the subcutaneous reservoir was selected below the clavicle and an incision was made through the skin and underlying soft tissues. The subcutaneous tissues were then dissected using a combination of blunt and sharp surgical technique and a pocket was formed. A single lumen power injectable portacatheter was then tunneled through the subcutaneous tissues from the pocket to the dermatotomy and the port reservoir placed within the subcutaneous pocket.  The venous access site was then serially dilated and a peel away vascular sheath placed over the wire. The wire was removed and the port catheter advanced into position under fluoroscopic guidance. The catheter tip is positioned in the upper right atrium. This was  documented with a spot image. The portacatheter was then tested and found to flush and aspirate well. The port was flushed with saline followed by 100 units/mL heparinized saline.  The pocket was then closed in two layers using first subdermal inverted interrupted absorbable sutures followed by a running subcuticular suture. The epidermis was then sealed with Dermabond. The dermatotomy at the venous access site was also closed with a single inverted subdermal suture and the epidermis sealed with Dermabond.  COMPLICATIONS: None.  The patient tolerated the procedure well.  IMPRESSION: Successful placement of a right IJ approach Power Port with ultrasound and fluoroscopic guidance. The catheter is ready for use.  Signed,  Criselda Peaches, MD  [facility]  [facility]   Electronically Signed   By: Jacqulynn Cadet M.D.   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  EXAM: IR RIGHT FLOURO GUIDE CV LINE; IR ULTRASOUND GUIDANCE VASC ACCESS RIGHT  Date: 05/09/2015  ANESTHESIA/SEDATION: Moderate (conscious) sedation was administered during this procedure. A total of 1.5 mg Versed and 75 mg Fentanyl were administered intravenously. The patient's vital signs were monitored continuously by radiology nursing throughout the course of the procedure.  Total sedation time: 21 minutes  FLUOROSCOPY TIME:  12 seconds  7 mGy  TECHNIQUE: The right neck and chest was prepped with chlorhexidine, and draped in the usual sterile fashion using maximum barrier technique (cap and mask, sterile gown, sterile gloves, large sterile sheet, hand hygiene and cutaneous antiseptic). Antibiotic prophylaxis was provided with 2g Ancef administered IV one hour prior to skin incision. Local anesthesia was attained by infiltration with 1% lidocaine with  epinephrine.  Ultrasound demonstrated patency of the right internal jugular vein, and this was documented with an image. Under real-time ultrasound guidance, this vein was accessed with a 21 gauge micropuncture needle and image documentation was performed. A small dermatotomy was made at the access site with an 11 scalpel. A 0.018" wire was advanced into the SVC and the access needle exchanged for a 5F micropuncture vascular sheath. The 0.018" wire was then removed and a 0.035" wire advanced into the IVC.  An appropriate location for the subcutaneous reservoir was selected below the clavicle and an incision was made through the skin and underlying soft tissues. The subcutaneous tissues were then dissected using a combination of blunt and sharp surgical technique and a pocket was formed. A single lumen power injectable portacatheter was then tunneled through the subcutaneous tissues from the pocket to the dermatotomy and the port reservoir placed within the subcutaneous pocket.  The venous access site was then serially dilated  and a peel away vascular sheath placed over the wire. The wire was removed and the port catheter advanced into position under fluoroscopic guidance. The catheter tip is positioned in the upper right atrium. This was documented with a spot image. The portacatheter was then tested and found to flush and aspirate well. The port was flushed with saline followed by 100 units/mL heparinized saline.  The pocket was then closed in two layers using first subdermal inverted interrupted absorbable sutures followed by a running subcuticular suture. The epidermis was then sealed with Dermabond. The dermatotomy at the venous access site was also closed with a single inverted subdermal suture and the epidermis sealed with Dermabond.  COMPLICATIONS: None.  The patient tolerated the procedure well.  IMPRESSION: Successful placement of a right IJ approach Power Port with ultrasound and fluoroscopic guidance. The  catheter is ready for use.  Signed,  Criselda Peaches, MD  [facility]  [facility]   Electronically Signed   By: Jacqulynn Cadet M.D.   On: 05/09/2015 17:45    Microbiology: Recent Results (from the past 240 hour(s))  Urine culture     Status: None   Collection Time: 06/04/15  5:51 AM  Result Value Ref Range Status   Specimen Description URINE, CLEAN CATCH  Final   Special Requests Immunocompromised  Final   Culture   Final    NO GROWTH 1 DAY Performed at [facility]    Report Status 06/05/2015 FINAL  Final  Blood culture (routine x 2)     Status: None (Preliminary result)   Collection Time: 06/04/15  6:05 AM  Result Value Ref Range Status   Specimen Description BLOOD RIGHT HAND  Final   Special Requests BOTTLES DRAWN AEROBIC AND ANAEROBIC 5CC  Final   Culture   Final    NO GROWTH 1 DAY Performed at [facility]    Report Status PENDING  Incomplete  Blood culture (routine x 2)     Status: None (Preliminary result)   Collection Time: 06/04/15  6:05 AM  Result Value Ref Range Status   Specimen Description BLOOD LEFT AC  Final   Special Requests BOTTLES DRAWN AEROBIC AND ANAEROBIC 5CC  Final   Culture   Final    NO GROWTH 1 DAY Performed at [facility]    Report Status PENDING  Incomplete     Labs: Basic Metabolic Panel:  Recent Labs Lab 06/04/15 0507 06/05/15 0409  NA 135 138  K 3.4* 4.5  CL 95* 101  CO2  --  30  GLUCOSE 180* 194*  BUN 25* 22*  CREATININE 1.10 1.20  CALCIUM  --  8.8*   Liver Function Tests: No results for input(s): AST, ALT, ALKPHOS, BILITOT, PROT, ALBUMIN in the last 168 hours. No results for input(s): LIPASE, AMYLASE in the last 168 hours. No results for input(s): AMMONIA in the last 168 hours. CBC:  Recent Labs Lab 06/04/15 0506 06/04/15 0507 06/05/15 0409  WBC 8.3  --  8.4  NEUTROABS 7.5  --   --   HGB 13.3 14.6 12.3*  HCT 40.9 43.0 38.7*  MCV 88.3  --   88.8  PLT 190  --  188   Cardiac Enzymes: No results for input(s): CKTOTAL, CKMB, CKMBINDEX, TROPONINI in the last 168 hours. BNP: BNP (last 3 results) No results for input(s): BNP in the last 8760 hours.  ProBNP (last 3 results) No results for input(s): PROBNP in the last 8760 hours.  CBG:  Recent Labs Lab 06/04/15 0413  GLUCAP 233*    Time coordinating discharge: 35 minutes  Signed:  Nao Linz  [facility] 06/05/2015, 5:04 PM

## 2015-06-05 NOTE — Progress Notes (Signed)
Shane Cantu   DOB:[birth year]   TK#:[id number]    Subjective: Was informed the patient was admitted to the hospital due to a mild syncopal episode at home causing right lower extremity/distal fibular fracture. The patient also have some fever on admission. His chemotherapy infusion pump was switched over to a regular infusion bag. He tolerated treatment well so far.  Objective:  Filed Vitals:   06/05/15 1324  BP: 138/79  Pulse:   Temp:   Resp:      Intake/Output Summary (Last 24 hours) at 06/05/15 1327 Last data filed at 06/05/15 1323  Gross per 24 hour  Intake 1894.25 ml  Output   4025 ml  Net -2130.75 ml    GENERAL:alert, no distress and comfortable SKIN: skin color, texture, turgor are normal, no rashes or significant lesions EYES: normal, Conjunctiva are pink and non-injected, sclera clear Musculoskeletal:no cyanosis of digits and no clubbing. His right lower extremity is bandaged and I did not examine it further NEURO: alert & oriented x 3 with fluent speech, no focal motor/sensory deficits   Labs:  Lab Results  Component Value Date   WBC 8.4 06/05/2015   HGB 12.3* 06/05/2015   HCT 38.7* 06/05/2015   MCV 88.8 06/05/2015   PLT 188 06/05/2015   NEUTROABS 7.5 06/04/2015    Lab Results  Component Value Date   NA 138 06/05/2015   K 4.5 06/05/2015   CL 101 06/05/2015   CO2 30 06/05/2015    Studies:  Dg Chest 2 View  06/04/2015   CLINICAL DATA:  Syncope and fall  EXAM: CHEST  2 VIEW  COMPARISON:  11/20/2014  FINDINGS: Right IJ porta catheter, tip at the SVC level.  Normal heart size and aortic contours.  There is no edema, consolidation, effusion, or pneumothorax.  No acute fracture.  IMPRESSION: No active cardiopulmonary disease.   Electronically Signed   By: Monte Fantasia M.D.   On: 06/04/2015 04:51   Dg Ribs Unilateral Right  06/04/2015   CLINICAL DATA:  Rib pain on right side. Fall 3 times yesterday due to lightheadedness. Pain in right posterior lower ribs.  EXAM:  RIGHT RIBS - 2 VIEW  COMPARISON:  06/04/2015  FINDINGS: Right Port-A-Cath in place with the tip in the SVC. Right lung is clear. No effusions or pneumothorax. No visible rib fracture.  IMPRESSION: No acute findings.   Electronically Signed   By: Rolm Baptise M.D.   On: 06/04/2015 13:18   Dg Ankle Complete Right  06/04/2015   CLINICAL DATA:  [place] 3 times at home today, twisted ankle. Bruising and edema about the ankle.  EXAM: RIGHT ANKLE - COMPLETE 3+ VIEW  COMPARISON:  None.  FINDINGS: Acute oblique distal fibular fracture in alignment. Widened medial ankle mortise. Lateral clear space appears intact. No destructive bony lesions. Moderate calcaneal spur partially imaged. Ankle soft tissue swelling without subcutaneous gas or radiopaque foreign bodies.  IMPRESSION: Acute nondisplaced distal fibular fracture.  Widened medial ankle mortise consistent with ligamentous injury. No dislocation.   Electronically Signed   By: Elon Alas M.D.   On: 06/04/2015 06:27   Ct Head Wo Contrast  06/04/2015   CLINICAL DATA:  Syncopal episode, frontal headache. History of head and neck cancer.  EXAM: CT HEAD WITHOUT CONTRAST  TECHNIQUE: Contiguous axial images were obtained from the base of the skull through the vertex without intravenous contrast.  COMPARISON:  None.  FINDINGS: The ventricles and sulci are normal for age. No intraparenchymal hemorrhage, mass effect nor  midline shift. No acute large vascular territory infarcts.  No abnormal extra-axial fluid collections. Basal cisterns are patent.  No skull fracture. The included ocular globes and orbital contents are non-suspicious. RIGHT maxillary sinus mucosal thickening with bony wall thickening consistent with chronic sinusitis, small RIGHT maxillary mucous retention cyst. The mastoid air cells are well aerated.  IMPRESSION: No acute intracranial process; normal noncontrast CT head for age.   Electronically Signed   By: Elon Alas M.D.   On: 06/04/2015 04:56     Assessment & Plan:  Tongue cancer He will complete 5FU infusion in the hospital today. I will see him as scheduled on Friday as an outpatient  Fibula fracture His currently on conservative management. I will defer to orthopedic surgeon for further management.  Fever So far, no signs of sepsis or infection. Okay to discontinue anti-biotics  Discharge planning I will defer to hospitalist for discharge. I will see him back in the office as scheduled on Friday   [facility], Peretz Thieme, MD 06/05/2015  1:27 PM

## 2015-06-05 NOTE — Progress Notes (Signed)
I faxed ondansetron prior auth to [facility]

## 2015-06-05 NOTE — Evaluation (Addendum)
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. He received his first dose of cetuximab, fluorouracil, and carboplatin on 7/1. Medial survival, per Dr. Calton Dach note is around 10 months  Pt admitted for Syncope and fever without Sirs criteria and not neutropenic (Fainting spells sound vasovagal and were likely related to fever and chemotherapy) as well as R fibular fx to be treated conservatively with TDWB per ortho  Clinical Impression  Pt admitted with above diagnosis. Pt currently with functional limitations due to the deficits listed below (see PT Problem List).  Pt will benefit from skilled PT to increase their independence and safety with mobility to allow discharge to the venue listed below.   Pt with increased pain with mobility and dependent position of R LE however not requiring too much physical assist.  Pt educated on TDWB status however poor demonstration with crutches so changed to RW with improvement.  CAM boot in room as well however not clarified by ortho if this is needed to wear (believe ordered per Dr. Sheran Fava?).  Pt reports his daughter can assist tomorrow and would benefit from another session with family to practice 4 steps into home.  Pt would benefit from RW at home and w/c for community distances to assist with maintaining weightbearing status.     Follow Up Recommendations Home health PT;Supervision for mobility/OOB    Equipment Recommendations  Wheelchair (measurements PT) Clarified with pt that he does not have RW at home and will also need this upon d/c   Recommendations for Other Services       Precautions / Restrictions Precautions Precautions: Fall Restrictions Weight Bearing Restrictions: Yes RLE Weight Bearing: Touchdown  weight bearing Other Position/Activity Restrictions: CAM boot      Mobility  Bed Mobility Overal bed mobility: Modified Independent                Transfers Overall transfer level: Needs assistance Equipment used: Rolling walker (2 wheeled) Transfers: Sit to/from Stand Sit to Stand: Min guard         General transfer comment: verbal cues for safe use of crutches and RW  Ambulation/Gait Ambulation/Gait assistance: Min guard Ambulation Distance (Feet): 50 Feet Assistive device: Rolling walker (2 wheeled) Gait Pattern/deviations: Step-to pattern;Antalgic     General Gait Details: pt initially educated using crutches however poor ability to maintain TDWB so switched to RW, improved TDWB with constant cues and use of RW  Stairs            Wheelchair Mobility    Modified Rankin (Stroke Patients Only)       Balance                                             Pertinent Vitals/Pain Pain Assessment: 0-10 Pain Score: 8  Pain Location: R ankle Pain Descriptors / Indicators: Aching;Sore Pain Intervention(s): Limited activity within patient's tolerance;Monitored during session;Repositioned;RN gave pain meds during session;Ice applied    Home Living Family/patient expects to be discharged to:: Private residence Living Arrangements: Alone Available Help at Discharge: Family (daughter coming to stay with pt tomorrow)   Home Access: Stairs to enter Entrance Stairs-Rails: Right;Left;Can reach both Entrance Stairs-Number of Steps: 4  Home Layout: One level Home Equipment: Walker - 2 wheels      Prior Function Level of Independence: Independent               Hand Dominance        Extremity/Trunk Assessment               Lower Extremity Assessment: Overall WFL for tasks assessed (did not test R ankle)      Cervical / Trunk Assessment: Normal  Communication   Communication: No difficulties  Cognition Arousal/Alertness:  Awake/alert Behavior During Therapy: WFL for tasks assessed/performed Overall Cognitive Status: Within Functional Limits for tasks assessed                      General Comments      Exercises        Assessment/Plan    PT Assessment Patient needs continued PT services  PT Diagnosis Acute pain;Difficulty walking   PT Problem List Decreased strength;Decreased mobility;Pain;Decreased knowledge of use of DME;Decreased knowledge of precautions  PT Treatment Interventions DME instruction;Gait training;Functional mobility training;Patient/family education;Therapeutic activities;Therapeutic exercise;Stair training   PT Goals (Current goals can be found in the Care Plan section) Acute Rehab PT Goals PT Goal Formulation: With patient Time For Goal Achievement: 06/12/15 Potential to Achieve Goals: Good    Frequency Min 3X/week   Barriers to discharge        Co-evaluation               End of Session Equipment Utilized During Treatment: Gait belt Activity Tolerance: Patient limited by pain Patient left: in bed;with call bell/phone within reach Nurse Communication: Mobility status    Functional Assessment Tool Used: clinical judgement Functional Limitation: Mobility: Walking and moving around Mobility: Walking and Moving Around Current Status (G9562): At least 1 percent but less than 20 percent impaired, limited or restricted Mobility: Walking and Moving Around Goal Status [phone]): At least 1 percent but less than 20 percent impaired, limited or restricted    Time: 0945-1004 PT Time Calculation (min) (ACUTE ONLY): 19 min   Charges:   PT Evaluation $Initial PT Evaluation Tier I: 1 Procedure     PT G Codes:   PT G-Codes **NOT FOR INPATIENT CLASS** Functional Assessment Tool Used: clinical judgement Functional Limitation: Mobility: Walking and moving around Mobility: Walking and Moving Around Current Status (V7846): At least 1 percent but less than 20 percent  impaired, limited or restricted Mobility: Walking and Moving Around Goal Status [phone]): At least 1 percent but less than 20 percent impaired, limited or restricted    Shelley Pooley,KATHrine E 06/05/2015, 1:17 PM Carmelia Bake, PT, DPT 06/05/2015 Pager: [phone]

## 2015-06-06 ENCOUNTER — Telehealth: Payer: Self-pay | Admitting: *Deleted

## 2015-06-06 NOTE — Telephone Encounter (Signed)
Spoke with patient. Has been admitted due to broken ankle. Using walker. Trouble with bowels-[name] wants him to take senakot 2 tablets TID. Pt verbalized understanding.

## 2015-06-06 NOTE — Telephone Encounter (Signed)
-----   Message from Arty Baumgartner, RN sent at 06/01/2015 11:22 AM EDT ----- Regarding: first time chemo. NG First time erbitux/carboplatin/18fu.  [name] [phone]

## 2015-06-07 ENCOUNTER — Encounter: Payer: Self-pay | Admitting: Hematology and Oncology

## 2015-06-07 NOTE — Progress Notes (Signed)
Per Arlene at [facility] ondansetron is approved.--prior auth 06/01/15

## 2015-06-08 ENCOUNTER — Ambulatory Visit: Payer: PPO

## 2015-06-08 ENCOUNTER — Telehealth: Payer: Self-pay | Admitting: *Deleted

## 2015-06-08 ENCOUNTER — Encounter: Payer: Self-pay | Admitting: Hematology and Oncology

## 2015-06-08 ENCOUNTER — Ambulatory Visit (HOSPITAL_BASED_OUTPATIENT_CLINIC_OR_DEPARTMENT_OTHER): Payer: PPO | Admitting: Hematology and Oncology

## 2015-06-08 ENCOUNTER — Ambulatory Visit (HOSPITAL_BASED_OUTPATIENT_CLINIC_OR_DEPARTMENT_OTHER): Payer: PPO

## 2015-06-08 ENCOUNTER — Ambulatory Visit: Payer: PPO | Admitting: Nutrition

## 2015-06-08 ENCOUNTER — Other Ambulatory Visit (HOSPITAL_BASED_OUTPATIENT_CLINIC_OR_DEPARTMENT_OTHER): Payer: PPO

## 2015-06-08 VITALS — BP 126/74 | HR 74 | Temp 98.1°F | Resp 18

## 2015-06-08 VITALS — BP 129/82 | HR 98 | Temp 98.8°F | Resp 18 | Ht 69.0 in | Wt 240.2 lb

## 2015-06-08 DIAGNOSIS — S82401A Unspecified fracture of shaft of right fibula, initial encounter for closed fracture: Secondary | ICD-10-CM

## 2015-06-08 DIAGNOSIS — M542 Cervicalgia: Secondary | ICD-10-CM

## 2015-06-08 DIAGNOSIS — C01 Malignant neoplasm of base of tongue: Secondary | ICD-10-CM

## 2015-06-08 DIAGNOSIS — L270 Generalized skin eruption due to drugs and medicaments taken internally: Secondary | ICD-10-CM

## 2015-06-08 DIAGNOSIS — K1231 Oral mucositis (ulcerative) due to antineoplastic therapy: Secondary | ICD-10-CM

## 2015-06-08 DIAGNOSIS — Z95828 Presence of other vascular implants and grafts: Secondary | ICD-10-CM

## 2015-06-08 DIAGNOSIS — Z5112 Encounter for antineoplastic immunotherapy: Secondary | ICD-10-CM

## 2015-06-08 DIAGNOSIS — G8929 Other chronic pain: Secondary | ICD-10-CM

## 2015-06-08 DIAGNOSIS — C029 Malignant neoplasm of tongue, unspecified: Secondary | ICD-10-CM

## 2015-06-08 DIAGNOSIS — T50915A Adverse effect of multiple unspecified drugs, medicaments and biological substances, initial encounter: Secondary | ICD-10-CM

## 2015-06-08 LAB — COMPREHENSIVE METABOLIC PANEL (CC13)
ALT: 17 U/L (ref 0–55)
AST: 15 U/L (ref 5–34)
Albumin: 3.1 g/dL — ABNORMAL LOW (ref 3.5–5.0)
Alkaline Phosphatase: 110 U/L (ref 40–150)
Anion Gap: 11 mEq/L (ref 3–11)
BUN: 24.6 mg/dL (ref 7.0–26.0)
CO2: 26 mEq/L (ref 22–29)
CREATININE: 1 mg/dL (ref 0.7–1.3)
Calcium: 8.9 mg/dL (ref 8.4–10.4)
Chloride: 99 mEq/L (ref 98–109)
EGFR: 81 mL/min/{1.73_m2} — ABNORMAL LOW (ref >90)
Glucose: 151 mg/dl — ABNORMAL HIGH (ref 70–140)
Potassium: 3.9 mEq/L (ref 3.5–5.1)
Sodium: 136 mEq/L (ref 136–145)
Total Bilirubin: 0.69 mg/dL (ref 0.20–1.20)
Total Protein: 6.5 g/dL (ref 6.4–8.3)

## 2015-06-08 LAB — CBC WITH DIFFERENTIAL/PLATELET
BASO%: 0.2 % (ref 0.0–2.0)
BASOS ABS: 0 10*3/uL (ref 0.0–0.1)
EOS ABS: 0 10*3/uL (ref 0.0–0.5)
EOS%: 0.7 % (ref 0.0–7.0)
HCT: 40.3 % (ref 38.4–49.9)
HEMOGLOBIN: 13.3 g/dL (ref 13.0–17.1)
LYMPH#: 0.2 10*3/uL — AB (ref 0.9–3.3)
LYMPH%: 3.5 % — ABNORMAL LOW (ref 14.0–49.0)
MCH: 28.3 pg (ref 27.2–33.4)
MCHC: 33.1 g/dL (ref 32.0–36.0)
MCV: 85.5 fL (ref 79.3–98.0)
MONO#: 0.2 10*3/uL (ref 0.1–0.9)
MONO%: 2.7 % (ref 0.0–14.0)
NEUT#: 5.9 10*3/uL (ref 1.5–6.5)
NEUT%: 92.9 % — AB (ref 39.0–75.0)
Platelets: 263 10*3/uL (ref 140–400)
RBC: 4.71 10*6/uL (ref 4.20–5.82)
RDW: 13.4 % (ref 11.0–14.6)
WBC: 6.4 10*3/uL (ref 4.0–10.3)

## 2015-06-08 LAB — MAGNESIUM (CC13): MAGNESIUM: 1.6 mg/dL (ref 1.5–2.5)

## 2015-06-08 MED ORDER — DIPHENHYDRAMINE HCL 50 MG/ML IJ SOLN
50.0000 mg | Freq: Once | INTRAMUSCULAR | Status: AC
  Administered 2015-06-08: 50 mg via INTRAVENOUS

## 2015-06-08 MED ORDER — DIPHENHYDRAMINE HCL 50 MG/ML IJ SOLN
INTRAMUSCULAR | Status: AC
  Filled 2015-06-08: qty 1

## 2015-06-08 MED ORDER — CETUXIMAB CHEMO IV INJECTION 200 MG/100ML
250.0000 mg/m2 | Freq: Once | INTRAVENOUS | Status: AC
  Administered 2015-06-08: 600 mg via INTRAVENOUS
  Filled 2015-06-08: qty 300

## 2015-06-08 MED ORDER — HEPARIN SOD (PORK) LOCK FLUSH 100 UNIT/ML IV SOLN
500.0000 [IU] | Freq: Once | INTRAVENOUS | Status: AC | PRN
  Administered 2015-06-08: 500 [IU]
  Filled 2015-06-08: qty 5

## 2015-06-08 MED ORDER — SODIUM CHLORIDE 0.9 % IJ SOLN
10.0000 mL | INTRAMUSCULAR | Status: DC | PRN
  Administered 2015-06-08: 10 mL
  Filled 2015-06-08: qty 10

## 2015-06-08 MED ORDER — SODIUM CHLORIDE 0.9 % IJ SOLN
10.0000 mL | INTRAMUSCULAR | Status: DC | PRN
  Administered 2015-06-08: 10 mL via INTRAVENOUS
  Filled 2015-06-08: qty 10

## 2015-06-08 MED ORDER — HYDROMORPHONE HCL 4 MG PO TABS: 4.0000 mg | ORAL_TABLET | ORAL | Status: DC | PRN

## 2015-06-08 MED ORDER — SODIUM CHLORIDE 0.9 % IV SOLN
Freq: Once | INTRAVENOUS | Status: AC
  Administered 2015-06-08: 13:00:00 via INTRAVENOUS

## 2015-06-08 NOTE — Telephone Encounter (Signed)
Oncology Nurse Navigator Documentation  Oncology Nurse Navigator Flowsheets 06/08/2015  Referral date to RadOnc/MedOnc -  Navigator Encounter Type Telephone  Spoke with pt's dtr Amy in follow-up to her earlier call and question re: proceeding with foot surgery vs continuing with chemo.  Per [name], patient is to make appt with orthopedist to evaluate for surgery which [name] believes is priority at this point in time.  Amy understands that orthopedist is to call [name] if he does not agree.   Amy stated appt is scheduled for 7/12 1515.   Gayleen Orem, RN, BSN, [place] [facility] [place] [phone]

## 2015-06-08 NOTE — Progress Notes (Signed)
[place] OFFICE PROGRESS NOTE  Patient Care Team: [name], MD as PCP - General (Family Medicine) Leota Sauers, RN as Oncology Nurse Navigator [name], MD as Consulting Physician (Hematology and Oncology) Eppie Gibson, MD as Attending Physician (Radiation Oncology) Karie Mainland, RD as Dietitian (Nutrition)  SUMMARY OF ONCOLOGIC HISTORY:   Tongue cancer   04/10/2015 Imaging CT Neck with contrast:  L base of tongue SCC with regional adenopathy; suspected metastatic disease to C2, T2.   04/13/2015 Initial Biopsy Accession [id number]:  Lymph node, needle/core biopsy - SCC, p16 positive.   04/18/2015 Initial Diagnosis Tongue cancer   04/24/2015 Imaging PET CT showed tongue cancer, lung nodule and possible bone mets   04/26/2015 Surgery He had dental extractions   05/03/2015 Imaging CT neck with contrast: L base of tongue with regional adenopathy, metastatic disease C2, C3, T2 vertebra; posterior L fourth rib.   05/09/2015 Procedure Port-a-cath placed.   05/11/2015 - 05/23/2015 Radiation Therapy He received palliative radiation therapy   06/01/2015 -  Chemotherapy He started cycle 1 of carboplatin, 5-FU and cetuximab   06/04/2015 - 06/05/2015 Hospital Admission He was admitted to the hospital after syncopal episode and had right nondisplaced fibular fracture    INTERVAL HISTORY: Please see below for problem oriented charting. He returns for further follow-up. He was discharged home recently because of fracture. He has multiple areas of pain, in His Neck, His Back and His Leg, All Controlled by Prescription Dilaudid. He Complained of Very Mild Mucositis and Mild Skin Rash. Denies Diarrhea. Denies Swallowing Difficulties.  REVIEW OF SYSTEMS:   Constitutional: Denies fevers, chills or abnormal weight loss Eyes: Denies blurriness of vision Respiratory: Denies cough, dyspnea or wheezes Cardiovascular: Denies palpitation, chest discomfort or lower extremity  swelling Lymphatics: Denies new lymphadenopathy or easy bruising Neurological:Denies numbness, tingling or new weaknesses Behavioral/Psych: Mood is stable, no new changes  All other systems were reviewed with the patient and are negative.  I have reviewed the past medical history, past surgical history, social history and family history with the patient and they are unchanged from previous note.  ALLERGIES:  is allergic to morphine and related.  MEDICATIONS:  Current Outpatient Prescriptions  Medication Sig Dispense Refill  . amLODipine (NORVASC) 5 MG tablet Take 5 mg by mouth daily.     . Armodafinil 250 MG tablet Take 250 mg by mouth daily.    Marland Kitchen aspirin EC 81 MG tablet Take 81 mg by mouth daily.    Marland Kitchen dexamethasone (DECADRON) 4 MG tablet Take 1 tablet (4 mg total) by mouth 2 (two) times daily. 60 tablet 0  . diphenhydrAMINE (BENADRYL) 25 mg capsule Take 25 mg by mouth every 6 (six) hours as needed for itching.    Marland Kitchen emollient (BIAFINE) cream Apply 1 application topically 3 (three) times daily as needed (pain).     . famotidine (PEPCID) 20 MG tablet Take 20 mg by mouth 2 (two) times daily.    Marland Kitchen gabapentin (NEURONTIN) 100 MG capsule Take 600 mg by mouth 2 (two) times daily. Takes with supper and at bedtime    . HYDROmorphone (DILAUDID) 4 MG tablet Take 1 tablet (4 mg total) by mouth every 4 (four) hours as needed for severe pain. 60 tablet 0  . ibuprofen (ADVIL,MOTRIN) 200 MG tablet Take 400 mg by mouth every 6 (six) hours as needed for mild pain or moderate pain.    Marland Kitchen lansoprazole (PREVACID) 30 MG capsule Take 30 mg by mouth daily at  12 noon.    . lidocaine (LIDODERM) 5 % Place 1 patch onto the skin daily. Remove & Discard patch within 12 hours or as directed by MD (Patient taking differently: Place 1 patch onto the skin daily as needed (pain). Remove & Discard patch within 12 hours or as directed by MD) 30 patch 0  . lidocaine (XYLOCAINE) 2 % solution Patient: Mix 1part 2% viscous lidocaine,  1part H20. Swish and/or swallow 14mL of this mixture, 41min before meals and at bedtime, up to QID (Patient taking differently: Use as directed 20 mLs in the mouth or throat every 6 (six) hours as needed. Patient: Mix 1part 2% viscous lidocaine, 1part H20. Swish and/or swallow 3mL of this mixture, 30min before meals and at bedtime, up to QID) 100 mL 4  . lidocaine-prilocaine (EMLA) cream Apply 1 application topically as needed. To Port a cath site one hour prior to needle stick 30 g 3  . Misc. Devices (PUMP IN STYLE ADVANCED) MISC Inject 1 cartridge into the vein See admin instructions. Adrucil 9350 mg  2.86ml/hr. Runs for 96 hours    . ondansetron (ZOFRAN) 8 MG tablet Take 1 tablet (8 mg total) by mouth every 8 (eight) hours as needed for nausea. 30 tablet 3  . oxymetazoline (AFRIN) 0.05 % nasal spray Place 2 sprays into both nostrils daily as needed for congestion.    . polyethylene glycol powder (GLYCOLAX/MIRALAX) powder Take 17 g by mouth 2 (two) times daily. 255 g 0  . promethazine (PHENERGAN) 25 MG tablet Take 1 tablet (25 mg total) by mouth every 6 (six) hours as needed for nausea. 30 tablet 3  . sucralfate (CARAFATE) 1 G tablet Dissolve 1 tablet in 10 mL H20 and swallow up to QID as needed for sore throat (Patient taking differently: Take 1 g by mouth 4 (four) times daily as needed (sore throat). Dissolve 1 tablet in 10 mL H20 and swallow up to QID as needed for sore throat) 40 tablet 3  . traZODone (DESYREL) 100 MG tablet Take 100 mg by mouth at bedtime.     No current facility-administered medications for this visit.   Facility-Administered Medications Ordered in Other Visits  Medication Dose Route Frequency Provider Last Rate Last Dose  . sodium chloride 0.9 % injection 10 mL  10 mL Intracatheter PRN [name], MD   10 mL at 06/08/15 1536    PHYSICAL EXAMINATION: ECOG PERFORMANCE STATUS: 1 - Symptomatic but completely ambulatory  Filed Vitals:   06/08/15 1229  BP: 129/82  Pulse: 98   Temp: 98.8 F (37.1 C)  Resp: 18   Filed Weights   06/08/15 1229  Weight: 240 lb 3.2 oz (108.954 kg)    GENERAL:alert, no distress and comfortable SKIN: Very mild skin rashes noted EYES: normal, Conjunctiva are pink and non-injected, sclera clear OROPHARYNX:no exudate, no erythema and lips, buccal mucosa, and tongue normal  NECK: supple, thyroid normal size, non-tender, without nodularity LYMPH:  Previously palpable lymphadenopathy in the left neck is getting some better LUNGS: clear to auscultation and percussion with normal breathing effort HEART: regular rate & rhythm and no murmurs and no lower extremity edema. His right foot in a cast ABDOMEN:abdomen soft, non-tender and normal bowel sounds Musculoskeletal:no cyanosis of digits and no clubbing  NEURO: alert & oriented x 3 with fluent speech, no focal motor/sensory deficits  LABORATORY DATA:  I have reviewed the data as listed    Component Value Date/Time   NA 136 06/08/2015 1202   NA  138 06/05/2015 0409   K 3.9 06/08/2015 1202   K 4.5 06/05/2015 0409   CL 101 06/05/2015 0409   CO2 26 06/08/2015 1202   CO2 30 06/05/2015 0409   GLUCOSE 151* 06/08/2015 1202   GLUCOSE 194* 06/05/2015 0409   BUN 24.6 06/08/2015 1202   BUN 22* 06/05/2015 0409   CREATININE 1.0 06/08/2015 1202   CREATININE 1.20 06/05/2015 0409   CALCIUM 8.9 06/08/2015 1202   CALCIUM 8.8* 06/05/2015 0409   PROT 6.5 06/08/2015 1202   PROT 7.9 11/20/2014 1115   ALBUMIN 3.1* 06/08/2015 1202   ALBUMIN 4.1 11/20/2014 1115   AST 15 06/08/2015 1202   AST 22 11/20/2014 1115   ALT 17 06/08/2015 1202   ALT 22 11/20/2014 1115   ALKPHOS 110 06/08/2015 1202   ALKPHOS 117 11/20/2014 1115   BILITOT 0.69 06/08/2015 1202   BILITOT 0.4 11/20/2014 1115   GFRNONAA >60 06/05/2015 0409   GFRAA >60 06/05/2015 0409    No results found for: SPEP, UPEP  Lab Results  Component Value Date   WBC 6.4 06/08/2015   NEUTROABS 5.9 06/08/2015   HGB 13.3 06/08/2015   HCT  40.3 06/08/2015   MCV 85.5 06/08/2015   PLT 263 06/08/2015      Chemistry      Component Value Date/Time   NA 136 06/08/2015 1202   NA 138 06/05/2015 0409   K 3.9 06/08/2015 1202   K 4.5 06/05/2015 0409   CL 101 06/05/2015 0409   CO2 26 06/08/2015 1202   CO2 30 06/05/2015 0409   BUN 24.6 06/08/2015 1202   BUN 22* 06/05/2015 0409   CREATININE 1.0 06/08/2015 1202   CREATININE 1.20 06/05/2015 0409      Component Value Date/Time   CALCIUM 8.9 06/08/2015 1202   CALCIUM 8.8* 06/05/2015 0409   ALKPHOS 110 06/08/2015 1202   ALKPHOS 117 11/20/2014 1115   AST 15 06/08/2015 1202   AST 22 11/20/2014 1115   ALT 17 06/08/2015 1202   ALT 22 11/20/2014 1115   BILITOT 0.69 06/08/2015 1202   BILITOT 0.4 11/20/2014 1115      ASSESSMENT & PLAN:  Tongue cancer Overall he tolerated treatment well up from mild skin rash related to side effects of cetuximab. He has a very mild nausea but no vomiting. I will continue treatment without dose adjustment. If he requires surgery further nonstop displaced fracture, I will delay his systemic chemotherapy but we can continue cetuximab weekly as it does not cause myelosuppression and poor wound healing  Chronic neck pain He has chronic neck pain from degenerative arthritis and probably from cancer. I will refill his pain medication prescription.  Fibula fracture I recommend he consult with orthopedic surgery to see if surgery is appropriate. If he needs surgery, we can delay carboplatin and 5-FU but continue cetuximab.  Acneiform drug eruption This is a classic side effects of cetuximab, only grade 1. I recommend topical over-the-counter cream as needed.  Mucositis due to chemotherapy This is only mild, grade 1. Continue observation for now. If he gets worse, will start Magic mouthwash. He will continue pain medicine as needed for now   No orders of the defined types were placed in this encounter.   All questions were answered. The  patient knows to call the clinic with any problems, questions or concerns. No barriers to learning was detected. I spent 30 minutes counseling the patient face to face. The total time spent in the appointment was 40 minutes and more  than 50% was on counseling and review of test results     [facility], Gregg Winchell, MD 06/08/2015 4:24 PM

## 2015-06-08 NOTE — Assessment & Plan Note (Signed)
This is a classic side effects of cetuximab, only grade 1. I recommend topical over-the-counter cream as needed.

## 2015-06-08 NOTE — Assessment & Plan Note (Signed)
This is only mild, grade 1. Continue observation for now. If he gets worse, will start Magic mouthwash. He will continue pain medicine as needed for now

## 2015-06-08 NOTE — Assessment & Plan Note (Signed)
I recommend he consult with orthopedic surgery to see if surgery is appropriate. If he needs surgery, we can delay carboplatin and 5-FU but continue cetuximab.

## 2015-06-08 NOTE — Assessment & Plan Note (Signed)
He has chronic neck pain from degenerative arthritis and probably from cancer. I will refill his pain medication prescription.

## 2015-06-08 NOTE — Assessment & Plan Note (Signed)
Overall he tolerated treatment well up from mild skin rash related to side effects of cetuximab. He has a very mild nausea but no vomiting. I will continue treatment without dose adjustment. If he requires surgery further nonstop displaced fracture, I will delay his systemic chemotherapy but we can continue cetuximab weekly as it does not cause myelosuppression and poor wound healing

## 2015-06-08 NOTE — Patient Instructions (Signed)
[facility] Discharge Instructions for Patients Receiving Chemotherapy  Today you received the following chemotherapy agents: Erbitux   To help prevent nausea and vomiting after your treatment, we encourage you to take your nausea medication as directed.    If you develop nausea and vomiting that is not controlled by your nausea medication, call the clinic.   BELOW ARE SYMPTOMS THAT SHOULD BE REPORTED IMMEDIATELY:  *FEVER GREATER THAN 100.5 F  *CHILLS WITH OR WITHOUT FEVER  NAUSEA AND VOMITING THAT IS NOT CONTROLLED WITH YOUR NAUSEA MEDICATION  *UNUSUAL SHORTNESS OF BREATH  *UNUSUAL BRUISING OR BLEEDING  TENDERNESS IN MOUTH AND THROAT WITH OR WITHOUT PRESENCE OF ULCERS  *URINARY PROBLEMS  *BOWEL PROBLEMS  UNUSUAL RASH Items with * indicate a potential emergency and should be followed up as soon as possible.  Feel free to call the clinic you have any questions or concerns. The clinic phone number is [phone].  Please show the CHEMO ALERT CARD at check-in to the Emergency Department and triage nurse.   

## 2015-06-08 NOTE — Progress Notes (Signed)
Nutrition follow-up completed with patient during chemotherapy for metastatic tongue cancer. Patient weight decreased and documented as 240 pounds July 8, down from 252 pounds July for Patient reports poor appetite. Patient states he has constipation. Denies other nutrition impact symptoms. Patient reports daughter is now staying with him so she will be able to help him with meals. Status post syncope with broken ankle. Possible surgery pending.  Nutrition diagnosis:  Predicted suboptimal energy intake has evolved into inadequate oral intake related to diagnosis of tongue cancer and associated treatments as evidenced by 12 pound weight loss over 4 days.  Intervention: Reviewed strategies for increasing calories and protein and provided fact sheet. Educated patient on strategies for improving oral intake with constipation and provided fact sheet Questions were answered and teach back method was used.  Monitoring, evaluation, goals: Patient will tolerate increased calories and protein to minimize further weight loss.  Next visit: Friday, July 15, during infusion.  **Disclaimer: This note was dictated with voice recognition software. Similar sounding words can inadvertently be transcribed and this note may contain transcription errors which may not have been corrected upon publication of note.**

## 2015-06-08 NOTE — Progress Notes (Signed)
Okay to proceed with treatment today with Magnesium level of 1.6 per [name].

## 2015-06-09 LAB — CULTURE, BLOOD (ROUTINE X 2)
Culture: NO GROWTH
Culture: NO GROWTH

## 2015-06-12 ENCOUNTER — Telehealth: Payer: Self-pay | Admitting: *Deleted

## 2015-06-12 NOTE — Telephone Encounter (Signed)
  Oncology Nurse Navigator Documentation   Navigator Encounter Type: Telephone (06/12/15 1355)      Received call from patient's dtr Amy s/p his orthopedic appt today.   Dr. Erlinda Hong stated that xray of his foot indicates it is healing and he does not think surgery is necessary.  He will xray again next week. He further stated that from his standpoint, [facility] can continue with scheduled chemotherapy.   I notified [name].   Gayleen Orem, RN, BSN, [place] at [place] [phone]

## 2015-06-13 ENCOUNTER — Telehealth: Payer: Self-pay | Admitting: *Deleted

## 2015-06-13 NOTE — Telephone Encounter (Signed)
  Oncology Nurse Navigator Documentation   Navigator Encounter Type: Telephone (06/13/15 1554)       I returned

## 2015-06-13 NOTE — Telephone Encounter (Signed)
Thanks

## 2015-06-15 ENCOUNTER — Ambulatory Visit (HOSPITAL_BASED_OUTPATIENT_CLINIC_OR_DEPARTMENT_OTHER): Payer: PPO | Admitting: Hematology and Oncology

## 2015-06-15 ENCOUNTER — Telehealth: Payer: Self-pay | Admitting: *Deleted

## 2015-06-15 ENCOUNTER — Ambulatory Visit (HOSPITAL_BASED_OUTPATIENT_CLINIC_OR_DEPARTMENT_OTHER): Payer: PPO

## 2015-06-15 ENCOUNTER — Ambulatory Visit: Payer: PPO | Admitting: Nutrition

## 2015-06-15 VITALS — BP 145/78 | HR 79 | Temp 98.2°F | Resp 19 | Ht 69.0 in | Wt 234.2 lb

## 2015-06-15 DIAGNOSIS — M542 Cervicalgia: Secondary | ICD-10-CM

## 2015-06-15 DIAGNOSIS — Z5112 Encounter for antineoplastic immunotherapy: Secondary | ICD-10-CM | POA: Diagnosis not present

## 2015-06-15 DIAGNOSIS — C029 Malignant neoplasm of tongue, unspecified: Secondary | ICD-10-CM

## 2015-06-15 DIAGNOSIS — C01 Malignant neoplasm of base of tongue: Secondary | ICD-10-CM

## 2015-06-15 DIAGNOSIS — L270 Generalized skin eruption due to drugs and medicaments taken internally: Secondary | ICD-10-CM | POA: Diagnosis not present

## 2015-06-15 DIAGNOSIS — G8929 Other chronic pain: Secondary | ICD-10-CM | POA: Diagnosis not present

## 2015-06-15 DIAGNOSIS — S82401D Unspecified fracture of shaft of right fibula, subsequent encounter for closed fracture with routine healing: Secondary | ICD-10-CM

## 2015-06-15 MED ORDER — DIPHENHYDRAMINE HCL 50 MG/ML IJ SOLN
INTRAMUSCULAR | Status: AC
  Filled 2015-06-15: qty 1

## 2015-06-15 MED ORDER — DIPHENHYDRAMINE HCL 50 MG/ML IJ SOLN
50.0000 mg | Freq: Once | INTRAMUSCULAR | Status: AC
  Administered 2015-06-15: 50 mg via INTRAVENOUS

## 2015-06-15 MED ORDER — CLINDAMYCIN PHOSPHATE 1 % EX GEL: Freq: Two times a day (BID) | CUTANEOUS | Status: DC

## 2015-06-15 MED ORDER — CETUXIMAB CHEMO IV INJECTION 200 MG/100ML
250.0000 mg/m2 | Freq: Once | INTRAVENOUS | Status: AC
  Administered 2015-06-15: 600 mg via INTRAVENOUS
  Filled 2015-06-15: qty 300

## 2015-06-15 MED ORDER — HYDROCORTISONE 0.5 % EX CREA: TOPICAL_CREAM | Freq: Three times a day (TID) | CUTANEOUS | Status: DC

## 2015-06-15 MED ORDER — HEPARIN SOD (PORK) LOCK FLUSH 100 UNIT/ML IV SOLN
500.0000 [IU] | Freq: Once | INTRAVENOUS | Status: AC | PRN
  Administered 2015-06-15: 500 [IU]
  Filled 2015-06-15: qty 5

## 2015-06-15 MED ORDER — SODIUM CHLORIDE 0.9 % IV SOLN
Freq: Once | INTRAVENOUS | Status: AC
  Administered 2015-06-15: 12:00:00 via INTRAVENOUS

## 2015-06-15 MED ORDER — SODIUM CHLORIDE 0.9 % IJ SOLN
10.0000 mL | INTRAMUSCULAR | Status: DC | PRN
  Administered 2015-06-15: 10 mL
  Filled 2015-06-15: qty 10

## 2015-06-15 NOTE — Assessment & Plan Note (Signed)
Thankfully, according to his orthopedic surgeon, the bone fracture is healing. If that is a case, we might not have to delay cycle 2 of treatment

## 2015-06-15 NOTE — Assessment & Plan Note (Signed)
His skin rashes getting worse. I recommend topical hydrocortisone cream with clindamycin gel. I would not hold treatment today. Next week, I will see him back prior to cycle 2 of treatment. If the skin rashes worse, we might have to hold or reduce the dose of cetuximab. In addition, he also have radiation recall in the lymph node region. I reassured the patient that this is not due to disease progression

## 2015-06-15 NOTE — Progress Notes (Signed)
Nutrition follow up completed with patient treated for metastatic tongue cancer. Patient reports he does not need to have surgery for his ankle. Weight decreased and documented as 234 pounds July 15. Patient denies nutrition impact symptoms. His daughter is preparing meals for him and he reports he thinks he is eating well.  Nutrition diagnosis: Inadequate oral intake continues.  Intervention: Educated patient to continue strategies for increasing oral intake. Recommended small frequent meals and snacks. Reminded patient importance of weight maintenance. Teach back method was used.  Monitoring, evaluation, goals: Patient will increase oral intake to minimize weight loss.  Next visit: Friday, August 5, during infusion.  **Disclaimer: This note was dictated with voice recognition software. Similar sounding words can inadvertently be transcribed and this note may contain transcription errors which may not have been corrected upon publication of note.**

## 2015-06-15 NOTE — Progress Notes (Signed)
Cameo RN states that [name] is OK treating patient with the labs drawn on 06/08/15

## 2015-06-15 NOTE — Telephone Encounter (Signed)
Cameo will add him to my schedule at 1115

## 2015-06-15 NOTE — Assessment & Plan Note (Signed)
Overall he tolerated treatment well up from mild skin rash related to side effects of cetuximab. He has a very mild nausea but no vomiting. I will continue treatment without dose adjustment.

## 2015-06-15 NOTE — Patient Instructions (Signed)
[facility] Discharge Instructions for Patients Receiving Chemotherapy  Today you received the following chemotherapy agents: Erbitux   To help prevent nausea and vomiting after your treatment, we encourage you to take your nausea medication as directed.    If you develop nausea and vomiting that is not controlled by your nausea medication, call the clinic.   BELOW ARE SYMPTOMS THAT SHOULD BE REPORTED IMMEDIATELY:  *FEVER GREATER THAN 100.5 F  *CHILLS WITH OR WITHOUT FEVER  NAUSEA AND VOMITING THAT IS NOT CONTROLLED WITH YOUR NAUSEA MEDICATION  *UNUSUAL SHORTNESS OF BREATH  *UNUSUAL BRUISING OR BLEEDING  TENDERNESS IN MOUTH AND THROAT WITH OR WITHOUT PRESENCE OF ULCERS  *URINARY PROBLEMS  *BOWEL PROBLEMS  UNUSUAL RASH Items with * indicate a potential emergency and should be followed up as soon as possible.  Feel free to call the clinic you have any questions or concerns. The clinic phone number is [phone].  Please show the CHEMO ALERT CARD at check-in to the Emergency Department and triage nurse.   

## 2015-06-15 NOTE — Telephone Encounter (Signed)
  Oncology Nurse Navigator Documentation   Navigator Encounter Type: Telephone (06/13/15 1550)      Patient called to report L neck swelling that is painful, tender to touch.  He is managing with PRN dilaudid.  He asks if [name] can see him during infusion on Friday.     Gayleen Orem, RN, BSN, [place] at [place] [phone]

## 2015-06-15 NOTE — Telephone Encounter (Signed)
Instructed pt to come in to see [name] today at 11:15 am,  Arrive 15 min early to check in.  He verbalized understanding.

## 2015-06-15 NOTE — Assessment & Plan Note (Signed)
He has chronic neck pain from degenerative arthritis and probably from cancer. It is well controlled with prescription pain medicine. He is not due for refill today.

## 2015-06-15 NOTE — Progress Notes (Signed)
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   04/13/2015 Initial Biopsy Accession [id number]:  Lymph node, needle/core biopsy - SCC, p16 positive.   04/18/2015 Initial Diagnosis Tongue cancer   04/24/2015 Imaging PET CT showed tongue cancer, lung nodule and possible bone mets   04/26/2015 Surgery He had dental extractions   05/03/2015 Imaging CT neck with contrast: L base of tongue with regional adenopathy, metastatic disease C2, C3, T2 vertebra; posterior L fourth rib.   05/09/2015 Procedure Port-a-cath placed.   05/11/2015 - 05/23/2015 Radiation Therapy He received palliative radiation therapy   06/01/2015 -  Chemotherapy He started cycle 1 of carboplatin, 5-FU and cetuximab   06/04/2015 - 06/05/2015 Hospital Admission He was admitted to the hospital after syncopal episode and had right nondisplaced fibular fracture    INTERVAL HISTORY: Please see below for problem oriented charting. He is seen urgently today because of worsening skin rash and neck swelling. He noted the skin rash got worse since last week after treatment. He was told by his orthopedic surgeon that the right leg fracture is improving. No surgery is indicated. He denies any dysphagia. His pain appears to be under good control. Denies any diarrhea, nausea or vomiting.  REVIEW OF SYSTEMS:   Constitutional: Denies fevers, chills or abnormal weight loss Eyes: Denies blurriness of vision Ears, nose, mouth, throat, and face: Denies mucositis or sore  throat Respiratory: Denies cough, dyspnea or wheezes Cardiovascular: Denies palpitation, chest discomfort or lower extremity swelling Gastrointestinal:  Denies nausea, heartburn or change in bowel habits Neurological:Denies numbness, tingling or new weaknesses Behavioral/Psych: Mood is stable, no new changes  All other systems were reviewed with the patient and are negative.  I have reviewed the past medical history, past surgical history, social history and family history with the patient and they are unchanged from previous note.  ALLERGIES:  is allergic to morphine and related.  MEDICATIONS:  Current Outpatient Prescriptions  Medication Sig Dispense Refill  . amLODipine (NORVASC) 5 MG tablet Take 5 mg by mouth daily.     . Armodafinil 250 MG tablet Take 250 mg by mouth daily.    Marland Kitchen aspirin EC 81 MG tablet Take 81 mg by mouth daily.    Marland Kitchen dexamethasone (DECADRON) 4 MG tablet Take 1 tablet (4 mg total) by mouth 2 (two) times daily. 60 tablet 0  . diphenhydrAMINE (BENADRYL) 25 mg capsule Take 25 mg by mouth every 6 (six) hours as needed for itching.    Marland Kitchen emollient (BIAFINE) cream Apply 1 application topically 3 (three) times daily as needed (pain).     . famotidine (PEPCID) 20 MG tablet Take 20 mg by mouth 2 (two) times daily.    Marland Kitchen gabapentin (NEURONTIN) 100 MG capsule Take 600 mg by mouth 2 (two) times daily. Takes with supper and at bedtime    . HYDROmorphone (DILAUDID) 4 MG tablet Take 1 tablet (4 mg total) by mouth every 4 (four) hours as needed for severe pain. 60 tablet 0  . ibuprofen (ADVIL,MOTRIN) 200 MG tablet Take 400 mg by mouth  every 6 (six) hours as needed for mild pain or moderate pain.    Marland Kitchen lansoprazole (PREVACID) 30 MG capsule Take 30 mg by mouth daily at 12 noon.    . lidocaine (LIDODERM) 5 % Place 1 patch onto the skin daily. Remove & Discard patch within 12 hours or as directed by MD (Patient taking differently: Place 1 patch onto the skin daily as needed (pain). Remove &  Discard patch within 12 hours or as directed by MD) 30 patch 0  . lidocaine (XYLOCAINE) 2 % solution Patient: Mix 1part 2% viscous lidocaine, 1part H20. Swish and/or swallow 70mL of this mixture, 69min before meals and at bedtime, up to QID (Patient taking differently: Use as directed 20 mLs in the mouth or throat every 6 (six) hours as needed. Patient: Mix 1part 2% viscous lidocaine, 1part H20. Swish and/or swallow 5mL of this mixture, 30min before meals and at bedtime, up to QID) 100 mL 4  . lidocaine-prilocaine (EMLA) cream Apply 1 application topically as needed. To Port a cath site one hour prior to needle stick 30 g 3  . Misc. Devices (PUMP IN STYLE ADVANCED) MISC Inject 1 cartridge into the vein See admin instructions. Adrucil 9350 mg  2.66ml/hr. Runs for 96 hours    . ondansetron (ZOFRAN) 8 MG tablet Take 1 tablet (8 mg total) by mouth every 8 (eight) hours as needed for nausea. 30 tablet 3  . oxymetazoline (AFRIN) 0.05 % nasal spray Place 2 sprays into both nostrils daily as needed for congestion.    . polyethylene glycol powder (GLYCOLAX/MIRALAX) powder Take 17 g by mouth 2 (two) times daily. 255 g 0  . promethazine (PHENERGAN) 25 MG tablet Take 1 tablet (25 mg total) by mouth every 6 (six) hours as needed for nausea. 30 tablet 3  . sucralfate (CARAFATE) 1 G tablet Dissolve 1 tablet in 10 mL H20 and swallow up to QID as needed for sore throat (Patient taking differently: Take 1 g by mouth 4 (four) times daily as needed (sore throat). Dissolve 1 tablet in 10 mL H20 and swallow up to QID as needed for sore throat) 40 tablet 3  . traZODone (DESYREL) 100 MG tablet Take 100 mg by mouth at bedtime.    . clindamycin (CLINDAGEL) 1 % gel Apply topically 2 (two) times daily. 60 g 0  . hydrocortisone cream 0.5 % Apply topically 3 (three) times daily. 30 g 0   No current facility-administered medications for this visit.   Facility-Administered Medications Ordered in Other Visits  Medication Dose Route  Frequency Provider Last Rate Last Dose  . sodium chloride 0.9 % injection 10 mL  10 mL Intracatheter PRN [name], MD   10 mL at 06/15/15 1446    PHYSICAL EXAMINATION: ECOG PERFORMANCE STATUS: 2 - Symptomatic, <50% confined to bed  Filed Vitals:   06/15/15 1148  BP: 145/78  Pulse: 79  Temp: 98.2 F (36.8 C)  Resp: 19   Filed Weights   06/15/15 1148  Weight: 234 lb 3.2 oz (106.232 kg)    GENERAL:alert, no distress and comfortable SKIN: He has significant acneiform rash on his face and radiation recall rash around his neck.  EYES: normal, Conjunctiva are pink and non-injected, sclera clear OROPHARYNX:no exudate, no erythema and lips, buccal mucosa, and tongue normal  NECK: supple, thyroid normal size, non-tender, without nodularity LYMPH:  Previously palpable lymphadenopathy has regressed in size. LUNGS: clear to auscultation and percussion with normal breathing effort HEART: regular rate & rhythm and  no murmurs and no lower extremity edema ABDOMEN:abdomen soft, non-tender and normal bowel sounds Musculoskeletal:no cyanosis of digits and no clubbing  NEURO: alert & oriented x 3 with fluent speech, no focal motor/sensory deficits  LABORATORY DATA:  I have reviewed the data as listed    Component Value Date/Time   NA 136 06/08/2015 1202   NA 138 06/05/2015 0409   K 3.9 06/08/2015 1202   K 4.5 06/05/2015 0409   CL 101 06/05/2015 0409   CO2 26 06/08/2015 1202   CO2 30 06/05/2015 0409   GLUCOSE 151* 06/08/2015 1202   GLUCOSE 194* 06/05/2015 0409   BUN 24.6 06/08/2015 1202   BUN 22* 06/05/2015 0409   CREATININE 1.0 06/08/2015 1202   CREATININE 1.20 06/05/2015 0409   CALCIUM 8.9 06/08/2015 1202   CALCIUM 8.8* 06/05/2015 0409   PROT 6.5 06/08/2015 1202   PROT 7.9 11/20/2014 1115   ALBUMIN 3.1* 06/08/2015 1202   ALBUMIN 4.1 11/20/2014 1115   AST 15 06/08/2015 1202   AST 22 11/20/2014 1115   ALT 17 06/08/2015 1202   ALT 22 11/20/2014 1115   ALKPHOS 110 06/08/2015 1202    ALKPHOS 117 11/20/2014 1115   BILITOT 0.69 06/08/2015 1202   BILITOT 0.4 11/20/2014 1115   GFRNONAA >60 06/05/2015 0409   GFRAA >60 06/05/2015 0409    No results found for: SPEP, UPEP  Lab Results  Component Value Date   WBC 6.4 06/08/2015   NEUTROABS 5.9 06/08/2015   HGB 13.3 06/08/2015   HCT 40.3 06/08/2015   MCV 85.5 06/08/2015   PLT 263 06/08/2015      Chemistry      Component Value Date/Time   NA 136 06/08/2015 1202   NA 138 06/05/2015 0409   K 3.9 06/08/2015 1202   K 4.5 06/05/2015 0409   CL 101 06/05/2015 0409   CO2 26 06/08/2015 1202   CO2 30 06/05/2015 0409   BUN 24.6 06/08/2015 1202   BUN 22* 06/05/2015 0409   CREATININE 1.0 06/08/2015 1202   CREATININE 1.20 06/05/2015 0409      Component Value Date/Time   CALCIUM 8.9 06/08/2015 1202   CALCIUM 8.8* 06/05/2015 0409   ALKPHOS 110 06/08/2015 1202   ALKPHOS 117 11/20/2014 1115   AST 15 06/08/2015 1202   AST 22 11/20/2014 1115   ALT 17 06/08/2015 1202   ALT 22 11/20/2014 1115   BILITOT 0.69 06/08/2015 1202   BILITOT 0.4 11/20/2014 1115      ASSESSMENT & PLAN:  Tongue cancer Overall he tolerated treatment well up from mild skin rash related to side effects of cetuximab. He has a very mild nausea but no vomiting. I will continue treatment without dose adjustment.  Chronic neck pain He has chronic neck pain from degenerative arthritis and probably from cancer. It is well controlled with prescription pain medicine. He is not due for refill today.    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 I recommend topical hydrocortisone cream with clindamycin gel. I would not hold treatment today. Next week, I will see him back prior to cycle 2 of treatment. If the skin rashes worse, we might have to hold or reduce the dose of cetuximab. In addition, he also have radiation recall in the lymph node region. I reassured the patient that this is not due to disease progression  Fibula  fracture Thankfully, according to his orthopedic surgeon, the bone fracture is healing. If that is a case, we might not have to delay cycle  2 of treatment   No orders of the defined types were placed in this encounter.   All questions were answered. The patient knows to call the clinic with any problems, questions or concerns. No barriers to learning was detected. I spent 30 minutes counseling the patient face to face. The total time spent in the appointment was 40 minutes and more than 50% was on counseling and review of test results     [facility], Treysen Sudbeck, MD 06/15/2015 5:13 PM

## 2015-06-19 ENCOUNTER — Telehealth: Payer: Self-pay | Admitting: *Deleted

## 2015-06-19 NOTE — Telephone Encounter (Signed)
  Oncology Nurse Navigator Documentation   Navigator Encounter Type: Telephone (06/19/15 1610)    Returned VM to patient dtr, addressed he questions about appts for remainder of week.   Gayleen Orem, RN, BSN, [place] at [place] [phone]

## 2015-06-20 ENCOUNTER — Other Ambulatory Visit (HOSPITAL_BASED_OUTPATIENT_CLINIC_OR_DEPARTMENT_OTHER): Payer: PPO

## 2015-06-20 ENCOUNTER — Ambulatory Visit (HOSPITAL_COMMUNITY): Payer: Self-pay | Admitting: Dentistry

## 2015-06-20 ENCOUNTER — Encounter (HOSPITAL_COMMUNITY): Payer: Self-pay | Admitting: Dentistry

## 2015-06-20 ENCOUNTER — Ambulatory Visit (HOSPITAL_BASED_OUTPATIENT_CLINIC_OR_DEPARTMENT_OTHER): Payer: PPO

## 2015-06-20 VITALS — BP 145/92 | HR 87 | Temp 98.4°F

## 2015-06-20 VITALS — BP 142/79 | HR 78 | Temp 97.9°F | Wt 240.0 lb

## 2015-06-20 DIAGNOSIS — Z5189 Encounter for other specified aftercare: Secondary | ICD-10-CM | POA: Diagnosis not present

## 2015-06-20 DIAGNOSIS — C01 Malignant neoplasm of base of tongue: Secondary | ICD-10-CM | POA: Diagnosis not present

## 2015-06-20 DIAGNOSIS — K117 Disturbances of salivary secretion: Secondary | ICD-10-CM

## 2015-06-20 DIAGNOSIS — R682 Dry mouth, unspecified: Secondary | ICD-10-CM

## 2015-06-20 DIAGNOSIS — K0820 Unspecified atrophy of edentulous alveolar ridge: Secondary | ICD-10-CM

## 2015-06-20 DIAGNOSIS — M264 Malocclusion, unspecified: Secondary | ICD-10-CM

## 2015-06-20 DIAGNOSIS — Z923 Personal history of irradiation: Secondary | ICD-10-CM

## 2015-06-20 DIAGNOSIS — Z95828 Presence of other vascular implants and grafts: Secondary | ICD-10-CM

## 2015-06-20 DIAGNOSIS — K08109 Complete loss of teeth, unspecified cause, unspecified class: Secondary | ICD-10-CM

## 2015-06-20 DIAGNOSIS — R432 Parageusia: Secondary | ICD-10-CM

## 2015-06-20 LAB — COMPREHENSIVE METABOLIC PANEL (CC13)
ALT: 21 U/L (ref 0–55)
ANION GAP: 8 meq/L (ref 3–11)
AST: 18 U/L (ref 5–34)
Albumin: 3.3 g/dL — ABNORMAL LOW (ref 3.5–5.0)
Alkaline Phosphatase: 165 U/L — ABNORMAL HIGH (ref 40–150)
BILIRUBIN TOTAL: 0.36 mg/dL (ref 0.20–1.20)
BUN: 16.1 mg/dL (ref 7.0–26.0)
CALCIUM: 9.7 mg/dL (ref 8.4–10.4)
CO2: 28 meq/L (ref 22–29)
CREATININE: 1.1 mg/dL (ref 0.7–1.3)
Chloride: 101 mEq/L (ref 98–109)
EGFR: 76 mL/min/{1.73_m2} — AB (ref >90)
GLUCOSE: 122 mg/dL (ref 70–140)
Potassium: 4.2 mEq/L (ref 3.5–5.1)
SODIUM: 138 meq/L (ref 136–145)
Total Protein: 7.1 g/dL (ref 6.4–8.3)

## 2015-06-20 LAB — CBC WITH DIFFERENTIAL/PLATELET
BASO%: 0.3 % (ref 0.0–2.0)
Basophils Absolute: 0 10*3/uL (ref 0.0–0.1)
EOS%: 2.3 % (ref 0.0–7.0)
Eosinophils Absolute: 0.1 10*3/uL (ref 0.0–0.5)
HCT: 38.8 % (ref 38.4–49.9)
HGB: 12.8 g/dL — ABNORMAL LOW (ref 13.0–17.1)
LYMPH%: 8.9 % — ABNORMAL LOW (ref 14.0–49.0)
MCH: 28.9 pg (ref 27.2–33.4)
MCHC: 33 g/dL (ref 32.0–36.0)
MCV: 87.6 fL (ref 79.3–98.0)
MONO#: 0.3 10*3/uL (ref 0.1–0.9)
MONO%: 8.9 % (ref 0.0–14.0)
NEUT%: 79.6 % — ABNORMAL HIGH (ref 39.0–75.0)
NEUTROS ABS: 2.8 10*3/uL (ref 1.5–6.5)
PLATELETS: 153 10*3/uL (ref 140–400)
RBC: 4.43 10*6/uL (ref 4.20–5.82)
RDW: 13.9 % (ref 11.0–14.6)
WBC: 3.5 10*3/uL — AB (ref 4.0–10.3)
lymph#: 0.3 10*3/uL — ABNORMAL LOW (ref 0.9–3.3)

## 2015-06-20 LAB — MAGNESIUM (CC13): MAGNESIUM: 1.7 mg/dL (ref 1.5–2.5)

## 2015-06-20 MED ORDER — HEPARIN SOD (PORK) LOCK FLUSH 100 UNIT/ML IV SOLN
500.0000 [IU] | Freq: Once | INTRAVENOUS | Status: AC
  Administered 2015-06-20: 500 [IU] via INTRAVENOUS
  Filled 2015-06-20: qty 5

## 2015-06-20 MED ORDER — SODIUM CHLORIDE 0.9 % IJ SOLN
10.0000 mL | INTRAMUSCULAR | Status: DC | PRN
  Administered 2015-06-20: 10 mL via INTRAVENOUS
  Filled 2015-06-20: qty 10

## 2015-06-20 NOTE — Patient Instructions (Addendum)
RECOMMENDATIONS: 1. Brush tongue daily. 2. Use Biotene Rinse or salt water/baking soda rinses. 3. Multiple sips of water as needed. 4. Return to clinic in one to two weeks for start of upper lower complete dentures. Patient ideally should follow-up with prosthodontist for evaluation for upper and lower complete dentures with or without implants. Patient refused at this time.  [name], DDS

## 2015-06-20 NOTE — Progress Notes (Signed)
06/20/2015  Patient Name:   Shane Cantu Date of Birth:   Mar 21, 1956 Medical Record Number: [id number]  BP 142/79 mmHg  Pulse 78  Temp(Src) 97.9 F (36.6 C) (Oral)  Wt 240 lb (108.863 kg)  Shane Cantu presents for oral examination after radiation therapy. She received radiation therapy from 05/10/2015 through 05/23/2015. The patient is currently undergoing chemotherapy with [name].  REVIEW OF CHIEF COMPLAINTS:  DRY MOUTH: Yes HARD TO SWALLOW: No   HURT TO SWALLOW: No TASTE CHANGES: Patient has taste changes. SORES IN MOUTH: No sores in his mouth. TRISMUS: No trismus symptoms  WEIGHT: 240 pounds down from an initial 255 pounds.  HOME OH REGIMEN:  BRUSHING: Edentulous FLOSSING: Not applicable RINSING: Using salt water and baking soda rinses and Biotene rinses. FLUORIDE: Not applicable TRISMUS EXERCISES:  Maximum interincisal opening: 53 mm   DENTAL EXAM:  Oral Hygiene:(PLAQUE): Edentulous LOCATION OF MUCOSITIS: None noted DESCRIPTION OF SALIVA: Decreased saliva. ANY EXPOSED BONE: None noted OTHER WATCHED AREAS: Upper left posterior molar area with no obvious signs of sinus involvement. Valsalva maneuver negative. DX: Xerostomia, Dysgeusia and Edentulous, and Malocclusion  RECOMMENDATIONS: 1. Brush tongue daily. 2. Use Biotene Rinse or salt water/baking soda rinses. 3. Multiple sips of water as needed. 4. Return to clinic in one to two weeks for start of upper lower complete dentures. Patient ideally should follow-up with prosthodontist for evaluation for upper and lower complete dentures with or without implants. Patient refused at this time.  [name], DDS

## 2015-06-20 NOTE — Patient Instructions (Signed)

## 2015-06-21 ENCOUNTER — Ambulatory Visit (HOSPITAL_BASED_OUTPATIENT_CLINIC_OR_DEPARTMENT_OTHER): Payer: PPO | Admitting: Hematology and Oncology

## 2015-06-21 ENCOUNTER — Other Ambulatory Visit: Payer: Self-pay

## 2015-06-21 ENCOUNTER — Encounter: Payer: Self-pay | Admitting: Hematology and Oncology

## 2015-06-21 ENCOUNTER — Telehealth: Payer: Self-pay | Admitting: *Deleted

## 2015-06-21 VITALS — BP 123/78 | HR 86 | Temp 98.0°F | Resp 20 | Ht 69.0 in | Wt 238.3 lb

## 2015-06-21 DIAGNOSIS — S82401D Unspecified fracture of shaft of right fibula, subsequent encounter for closed fracture with routine healing: Secondary | ICD-10-CM

## 2015-06-21 DIAGNOSIS — D6481 Anemia due to antineoplastic chemotherapy: Secondary | ICD-10-CM

## 2015-06-21 DIAGNOSIS — D638 Anemia in other chronic diseases classified elsewhere: Secondary | ICD-10-CM | POA: Insufficient documentation

## 2015-06-21 DIAGNOSIS — L270 Generalized skin eruption due to drugs and medicaments taken internally: Secondary | ICD-10-CM | POA: Diagnosis not present

## 2015-06-21 DIAGNOSIS — D701 Agranulocytosis secondary to cancer chemotherapy: Secondary | ICD-10-CM

## 2015-06-21 DIAGNOSIS — G8929 Other chronic pain: Secondary | ICD-10-CM | POA: Diagnosis not present

## 2015-06-21 DIAGNOSIS — T451X5A Adverse effect of antineoplastic and immunosuppressive drugs, initial encounter: Secondary | ICD-10-CM

## 2015-06-21 DIAGNOSIS — M542 Cervicalgia: Secondary | ICD-10-CM

## 2015-06-21 DIAGNOSIS — C01 Malignant neoplasm of base of tongue: Secondary | ICD-10-CM

## 2015-06-21 DIAGNOSIS — C029 Malignant neoplasm of tongue, unspecified: Secondary | ICD-10-CM

## 2015-06-21 NOTE — Assessment & Plan Note (Signed)
Thankfully, according to his orthopedic surgeon, the bone fracture is healing. We will continue conservative management.

## 2015-06-21 NOTE — Assessment & Plan Note (Signed)
His skin rash has improved. I recommend topical hydrocortisone cream with clindamycin gel. In addition, he also have radiation recall in the lymph node region. I reassured the patient that this is not due to disease progression

## 2015-06-21 NOTE — Assessment & Plan Note (Signed)
Overall hhe tolerated treatment well. Tumor in the left neck is shrinking He had very mild, expected side effects of treatment. I will proceed with treatment tomorrow without dose adjustment.

## 2015-06-21 NOTE — Assessment & Plan Note (Signed)
This is likely due to recent treatment. The patient denies recent history of bleeding such as epistaxis, hematuria or hematochezia. He is asymptomatic from the anemia. I will observe for now.  He does not require transfusion now. I will continue the chemotherapy at current dose without dosage adjustment.  If the anemia gets progressive worse in the future, I might have to delay his treatment or adjust the chemotherapy dose.  

## 2015-06-21 NOTE — Telephone Encounter (Signed)
CALLED PATIENT TO INFORM OF FU ON 07-20-15 @ 4:40 PM WITH DR. Isidore Moos, LVM FOR A RETURN CALL

## 2015-06-21 NOTE — Assessment & Plan Note (Signed)
He has chronic neck pain from degenerative arthritis and probably from cancer. It is well controlled with prescription pain medicine. He is not due for refill today.

## 2015-06-21 NOTE — Progress Notes (Signed)
[place] OFFICE PROGRESS NOTE  Patient Care Team: [name], MD as PCP - General (Family Medicine) Leota Sauers, RN as Oncology Nurse Navigator [name], MD as Consulting Physician (Hematology and Oncology) Eppie Gibson, MD as Attending Physician (Radiation Oncology) Karie Mainland, RD as Dietitian (Nutrition)  SUMMARY OF ONCOLOGIC HISTORY:   Tongue cancer   04/10/2015 Imaging CT Neck with contrast:  L base of tongue SCC with regional adenopathy; suspected metastatic disease to C2, T2.   04/13/2015 Initial Biopsy Accession [id number]:  Lymph node, needle/core biopsy - SCC, p16 positive.   04/18/2015 Initial Diagnosis Tongue cancer   04/24/2015 Imaging PET CT showed tongue cancer, lung nodule and possible bone mets   04/26/2015 Surgery He had dental extractions   05/03/2015 Imaging CT neck with contrast: L base of tongue with regional adenopathy, metastatic disease C2, C3, T2 vertebra; posterior L fourth rib.   05/09/2015 Procedure Port-a-cath placed.   05/11/2015 - 05/23/2015 Radiation Therapy He received palliative radiation therapy   06/01/2015 -  Chemotherapy He started cycle 1 of carboplatin, 5-FU and cetuximab   06/04/2015 - 06/05/2015 Hospital Admission He was admitted to the hospital after syncopal episode and had right nondisplaced fibular fracture    INTERVAL HISTORY: Please see below for problem oriented charting. He returns prior to cycle 2 of treatment. Since the last time I saw him, his pain is well-controlled. The skin rash around his face is improving. He denies nausea or mucositis.  REVIEW OF SYSTEMS:   Constitutional: Denies fevers, chills or abnormal weight loss Eyes: Denies blurriness of vision Ears, nose, mouth, throat, and face: Denies mucositis or sore throat Respiratory: Denies cough, dyspnea or wheezes Cardiovascular: Denies palpitation, chest discomfort or lower extremity swelling Gastrointestinal:  Denies nausea, heartburn or change in bowel  habits Lymphatics: Denies new lymphadenopathy or easy bruising Neurological:Denies numbness, tingling or new weaknesses Behavioral/Psych: Mood is stable, no new changes  All other systems were reviewed with the patient and are negative.  I have reviewed the past medical history, past surgical history, social history and family history with the patient and they are unchanged from previous note.  ALLERGIES:  is allergic to morphine and related.  MEDICATIONS:  Current Outpatient Prescriptions  Medication Sig Dispense Refill  . amLODipine (NORVASC) 5 MG tablet Take 5 mg by mouth daily.     . Armodafinil 250 MG tablet Take 250 mg by mouth daily.    Marland Kitchen aspirin EC 81 MG tablet Take 81 mg by mouth daily.    . clindamycin (CLINDAGEL) 1 % gel Apply topically 2 (two) times daily. 60 g 0  . dexamethasone (DECADRON) 4 MG tablet Take 1 tablet (4 mg total) by mouth 2 (two) times daily. 60 tablet 0  . diphenhydrAMINE (BENADRYL) 25 mg capsule Take 25 mg by mouth every 6 (six) hours as needed for itching.    Marland Kitchen emollient (BIAFINE) cream Apply 1 application topically 3 (three) times daily as needed (pain).     . famotidine (PEPCID) 20 MG tablet Take 20 mg by mouth 2 (two) times daily.    Marland Kitchen gabapentin (NEURONTIN) 100 MG capsule Take 600 mg by mouth 2 (two) times daily. Takes with supper and at bedtime    . hydrocortisone cream 0.5 % Apply topically 3 (three) times daily. 30 g 0  . HYDROmorphone (DILAUDID) 4 MG tablet Take 1 tablet (4 mg total) by mouth every 4 (four) hours as needed for severe pain. 60 tablet 0  . ibuprofen (  ADVIL,MOTRIN) 200 MG tablet Take 400 mg by mouth every 6 (six) hours as needed for mild pain or moderate pain.    Marland Kitchen lansoprazole (PREVACID) 30 MG capsule Take 30 mg by mouth daily at 12 noon.    . lidocaine (LIDODERM) 5 % Place 1 patch onto the skin daily. Remove & Discard patch within 12 hours or as directed by MD (Patient taking differently: Place 1 patch onto the skin daily as needed  (pain). Remove & Discard patch within 12 hours or as directed by MD) 30 patch 0  . lidocaine (XYLOCAINE) 2 % solution Patient: Mix 1part 2% viscous lidocaine, 1part H20. Swish and/or swallow 71mL of this mixture, 103min before meals and at bedtime, up to QID (Patient taking differently: Use as directed 20 mLs in the mouth or throat every 6 (six) hours as needed. Patient: Mix 1part 2% viscous lidocaine, 1part H20. Swish and/or swallow 28mL of this mixture, 14min before meals and at bedtime, up to QID) 100 mL 4  . lidocaine-prilocaine (EMLA) cream Apply 1 application topically as needed. To Port a cath site one hour prior to needle stick 30 g 3  . Misc. Devices (PUMP IN STYLE ADVANCED) MISC Inject 1 cartridge into the vein See admin instructions. Adrucil 9350 mg  2.55ml/hr. Runs for 96 hours    . ondansetron (ZOFRAN) 8 MG tablet Take 1 tablet (8 mg total) by mouth every 8 (eight) hours as needed for nausea. 30 tablet 3  . oxymetazoline (AFRIN) 0.05 % nasal spray Place 2 sprays into both nostrils daily as needed for congestion.    . polyethylene glycol powder (GLYCOLAX/MIRALAX) powder Take 17 g by mouth 2 (two) times daily. 255 g 0  . promethazine (PHENERGAN) 25 MG tablet Take 1 tablet (25 mg total) by mouth every 6 (six) hours as needed for nausea. 30 tablet 3  . sucralfate (CARAFATE) 1 G tablet Dissolve 1 tablet in 10 mL H20 and swallow up to QID as needed for sore throat (Patient taking differently: Take 1 g by mouth 4 (four) times daily as needed (sore throat). Dissolve 1 tablet in 10 mL H20 and swallow up to QID as needed for sore throat) 40 tablet 3  . traZODone (DESYREL) 100 MG tablet Take 100 mg by mouth at bedtime.     No current facility-administered medications for this visit.    PHYSICAL EXAMINATION: ECOG PERFORMANCE STATUS: 1 - Symptomatic but completely ambulatory  Filed Vitals:   06/21/15 1347  BP: 123/78  Pulse: 86  Temp: 98 F (36.7 C)  Resp: 20   Filed Weights   06/21/15 1347   Weight: 238 lb 4.8 oz (108.092 kg)    GENERAL:alert, no distress and comfortable SKIN: Skin rash on his face and neck has improved.  EYES: normal, Conjunctiva are pink and non-injected, sclera clear OROPHARYNX:no exudate, no erythema and lips, buccal mucosa, and tongue normal  NECK: supple, thyroid normal size, non-tender, without nodularity LYMPH:  Previously palpable lymphadenopathy is almost gone  LUNGS: clear to auscultation and percussion with normal breathing effort HEART: regular rate & rhythm and no murmurs and no lower extremity edema ABDOMEN:abdomen soft, non-tender and normal bowel sounds Musculoskeletal:no cyanosis of digits and no clubbing  NEURO: alert & oriented x 3 with fluent speech, no focal motor/sensory deficits  LABORATORY DATA:  I have reviewed the data as listed    Component Value Date/Time   NA 138 06/20/2015 1036   NA 138 06/05/2015 0409   K 4.2 06/20/2015 1036  K 4.5 06/05/2015 0409   CL 101 06/05/2015 0409   CO2 28 06/20/2015 1036   CO2 30 06/05/2015 0409   GLUCOSE 122 06/20/2015 1036   GLUCOSE 194* 06/05/2015 0409   BUN 16.1 06/20/2015 1036   BUN 22* 06/05/2015 0409   CREATININE 1.1 06/20/2015 1036   CREATININE 1.20 06/05/2015 0409   CALCIUM 9.7 06/20/2015 1036   CALCIUM 8.8* 06/05/2015 0409   PROT 7.1 06/20/2015 1036   PROT 7.9 11/20/2014 1115   ALBUMIN 3.3* 06/20/2015 1036   ALBUMIN 4.1 11/20/2014 1115   AST 18 06/20/2015 1036   AST 22 11/20/2014 1115   ALT 21 06/20/2015 1036   ALT 22 11/20/2014 1115   ALKPHOS 165* 06/20/2015 1036   ALKPHOS 117 11/20/2014 1115   BILITOT 0.36 06/20/2015 1036   BILITOT 0.4 11/20/2014 1115   GFRNONAA >60 06/05/2015 0409   GFRAA >60 06/05/2015 0409    No results found for: SPEP, UPEP  Lab Results  Component Value Date   WBC 3.5* 06/20/2015   NEUTROABS 2.8 06/20/2015   HGB 12.8* 06/20/2015   HCT 38.8 06/20/2015   MCV 87.6 06/20/2015   PLT 153 06/20/2015      Chemistry      Component Value  Date/Time   NA 138 06/20/2015 1036   NA 138 06/05/2015 0409   K 4.2 06/20/2015 1036   K 4.5 06/05/2015 0409   CL 101 06/05/2015 0409   CO2 28 06/20/2015 1036   CO2 30 06/05/2015 0409   BUN 16.1 06/20/2015 1036   BUN 22* 06/05/2015 0409   CREATININE 1.1 06/20/2015 1036   CREATININE 1.20 06/05/2015 0409      Component Value Date/Time   CALCIUM 9.7 06/20/2015 1036   CALCIUM 8.8* 06/05/2015 0409   ALKPHOS 165* 06/20/2015 1036   ALKPHOS 117 11/20/2014 1115   AST 18 06/20/2015 1036   AST 22 11/20/2014 1115   ALT 21 06/20/2015 1036   ALT 22 11/20/2014 1115   BILITOT 0.36 06/20/2015 1036   BILITOT 0.4 11/20/2014 1115       ASSESSMENT & PLAN:  Tongue cancer Overall hhe tolerated treatment well. Tumor in the left neck is shrinking He had very mild, expected side effects of treatment. I will proceed with treatment tomorrow without dose adjustment.  Chronic neck pain He has chronic neck pain from degenerative arthritis and probably from cancer. It is well controlled with prescription pain medicine. He is not due for refill today.      Anemia due to antineoplastic chemotherapy This is likely due to recent treatment. The patient denies recent history of bleeding such as epistaxis, hematuria or hematochezia. He is asymptomatic from the anemia. I will observe for now.  He does not require transfusion now. I will continue the chemotherapy at current dose without dosage adjustment.  If the anemia gets progressive worse in the future, I might have to delay his treatment or adjust the chemotherapy dose.   Leukopenia due to antineoplastic chemotherapy This is likely due to recent treatment. The patient denies recent history of fevers, cough, chills, diarrhea or dysuria. He is asymptomatic from the leukopenia. I will observe for now.  I will continue the chemotherapy at current dose without dosage adjustment.  If the leukopenia gets progressive worse in the future, I might have to delay his  treatment or adjust the chemotherapy dose.    Acneiform drug eruption His skin rash has improved. I recommend topical hydrocortisone cream with clindamycin gel. In addition, he also have radiation  recall in the lymph node region. I reassured the patient that this is not due to disease progression  Fibula fracture Thankfully, according to his orthopedic surgeon, the bone fracture is healing. We will continue conservative management.      No orders of the defined types were placed in this encounter.   All questions were answered. The patient knows to call the clinic with any problems, questions or concerns. No barriers to learning was detected. I spent 30 minutes counseling the patient face to face. The total time spent in the appointment was 40 minutes and more than 50% was on counseling and review of test results     [facility], [place], MD 06/21/2015 5:02 PM

## 2015-06-21 NOTE — Assessment & Plan Note (Signed)
This is likely due to recent treatment. The patient denies recent history of fevers, cough, chills, diarrhea or dysuria. He is asymptomatic from the leukopenia. I will observe for now.  I will continue the chemotherapy at current dose without dosage adjustment.  If the leukopenia gets progressive worse in the future, I might have to delay his treatment or adjust the chemotherapy dose. 

## 2015-06-22 ENCOUNTER — Telehealth: Payer: Self-pay | Admitting: Hematology and Oncology

## 2015-06-22 ENCOUNTER — Ambulatory Visit (HOSPITAL_BASED_OUTPATIENT_CLINIC_OR_DEPARTMENT_OTHER): Payer: PPO

## 2015-06-22 ENCOUNTER — Other Ambulatory Visit: Payer: Self-pay | Admitting: Hematology and Oncology

## 2015-06-22 VITALS — BP 154/88 | HR 90 | Temp 98.2°F

## 2015-06-22 DIAGNOSIS — C01 Malignant neoplasm of base of tongue: Secondary | ICD-10-CM

## 2015-06-22 DIAGNOSIS — C029 Malignant neoplasm of tongue, unspecified: Secondary | ICD-10-CM

## 2015-06-22 DIAGNOSIS — Z5111 Encounter for antineoplastic chemotherapy: Secondary | ICD-10-CM

## 2015-06-22 DIAGNOSIS — Z5112 Encounter for antineoplastic immunotherapy: Secondary | ICD-10-CM

## 2015-06-22 MED ORDER — SODIUM CHLORIDE 0.9 % IV SOLN
Freq: Once | INTRAVENOUS | Status: AC
  Administered 2015-06-22: 11:00:00 via INTRAVENOUS
  Filled 2015-06-22: qty 8

## 2015-06-22 MED ORDER — DIPHENHYDRAMINE HCL 50 MG/ML IJ SOLN
INTRAMUSCULAR | Status: AC
  Filled 2015-06-22: qty 1

## 2015-06-22 MED ORDER — DIPHENHYDRAMINE HCL 50 MG/ML IJ SOLN
50.0000 mg | Freq: Once | INTRAMUSCULAR | Status: AC
  Administered 2015-06-22: 50 mg via INTRAVENOUS

## 2015-06-22 MED ORDER — SODIUM CHLORIDE 0.9 % IV SOLN
Freq: Once | INTRAVENOUS | Status: AC
  Administered 2015-06-22: 11:00:00 via INTRAVENOUS

## 2015-06-22 MED ORDER — SODIUM CHLORIDE 0.9 % IV SOLN
685.0000 mg | Freq: Once | INTRAVENOUS | Status: AC
  Administered 2015-06-22: 690 mg via INTRAVENOUS
  Filled 2015-06-22: qty 69

## 2015-06-22 MED ORDER — CETUXIMAB CHEMO IV INJECTION 200 MG/100ML
250.0000 mg/m2 | Freq: Once | INTRAVENOUS | Status: AC
  Administered 2015-06-22: 600 mg via INTRAVENOUS
  Filled 2015-06-22: qty 300

## 2015-06-22 MED ORDER — SODIUM CHLORIDE 0.9 % IV SOLN
1000.0000 mg/m2/d | INTRAVENOUS | Status: DC
  Administered 2015-06-22: 9350 mg via INTRAVENOUS
  Filled 2015-06-22: qty 187

## 2015-06-22 NOTE — Telephone Encounter (Signed)
lvm for pt that 8.12 lab and MD visit added.Marland KitchenMarland Kitchenpt will get new sched in chemo

## 2015-06-22 NOTE — Patient Instructions (Signed)
[facility] Discharge Instructions for Patients Receiving Chemotherapy  Today you received the following chemotherapy agents: Erbitux, Carboplatin, 5FU  To help prevent nausea and vomiting after your treatment, we encourage you to take your nausea medication: Phenergan 25 mg every 6 hours as needed; Zofran 8 mg every 8 hours as needed.   If you develop nausea and vomiting that is not controlled by your nausea medication, call the clinic.   BELOW ARE SYMPTOMS THAT SHOULD BE REPORTED IMMEDIATELY:  *FEVER GREATER THAN 100.5 F  *CHILLS WITH OR WITHOUT FEVER  NAUSEA AND VOMITING THAT IS NOT CONTROLLED WITH YOUR NAUSEA MEDICATION  *UNUSUAL SHORTNESS OF BREATH  *UNUSUAL BRUISING OR BLEEDING  TENDERNESS IN MOUTH AND THROAT WITH OR WITHOUT PRESENCE OF ULCERS  *URINARY PROBLEMS  *BOWEL PROBLEMS  UNUSUAL RASH Items with * indicate a potential emergency and should be followed up as soon as possible.  Feel free to call the clinic you have any questions or concerns. The clinic phone number is [phone].  Please show the [facility] at check-in to the Emergency Department and triage nurse.

## 2015-06-22 NOTE — Progress Notes (Signed)
Pt has recent fracture of right ankle. Kept it elevated during treatment time and applied ice packs as needed. Pt did well. Took his own pain meds with relief of pain, along with ice packs, he was comfortable.

## 2015-06-26 ENCOUNTER — Ambulatory Visit (HOSPITAL_BASED_OUTPATIENT_CLINIC_OR_DEPARTMENT_OTHER): Payer: PPO

## 2015-06-26 ENCOUNTER — Ambulatory Visit (HOSPITAL_COMMUNITY): Payer: Self-pay | Admitting: Dentistry

## 2015-06-26 ENCOUNTER — Encounter (HOSPITAL_COMMUNITY): Payer: Self-pay | Admitting: Dentistry

## 2015-06-26 VITALS — BP 111/85 | HR 96 | Temp 98.7°F | Resp 18

## 2015-06-26 VITALS — BP 135/94 | HR 96 | Temp 98.5°F

## 2015-06-26 DIAGNOSIS — K08109 Complete loss of teeth, unspecified cause, unspecified class: Secondary | ICD-10-CM

## 2015-06-26 DIAGNOSIS — K117 Disturbances of salivary secretion: Secondary | ICD-10-CM

## 2015-06-26 DIAGNOSIS — C01 Malignant neoplasm of base of tongue: Secondary | ICD-10-CM

## 2015-06-26 DIAGNOSIS — R682 Dry mouth, unspecified: Secondary | ICD-10-CM

## 2015-06-26 DIAGNOSIS — K0820 Unspecified atrophy of edentulous alveolar ridge: Secondary | ICD-10-CM

## 2015-06-26 DIAGNOSIS — Z463 Encounter for fitting and adjustment of dental prosthetic device: Secondary | ICD-10-CM

## 2015-06-26 DIAGNOSIS — Z923 Personal history of irradiation: Secondary | ICD-10-CM

## 2015-06-26 MED ORDER — HEPARIN SOD (PORK) LOCK FLUSH 100 UNIT/ML IV SOLN
500.0000 [IU] | Freq: Once | INTRAVENOUS | Status: AC | PRN
  Administered 2015-06-26: 500 [IU]
  Filled 2015-06-26: qty 5

## 2015-06-26 MED ORDER — SODIUM CHLORIDE 0.9 % IJ SOLN
10.0000 mL | INTRAMUSCULAR | Status: DC | PRN
  Administered 2015-06-26: 10 mL
  Filled 2015-06-26: qty 10

## 2015-06-26 NOTE — Patient Instructions (Signed)
Return to clinic as scheduled for continued upper and lower complete denture fabrication. Dr. Kulinski 

## 2015-06-26 NOTE — Progress Notes (Signed)
06/26/2015  Patient Name:   Shane Cantu Date of Birth:   [birth year] Medical Record Number: [id number]  BP 135/94 mmHg  Pulse 96  Temp(Src) 98.5 F (36.9 C) (Oral)   [name] presents for start of upper and lower denture fabrication.  Exam: Patient is edentulous. Discussed procedures involved in upper and lower denture fabrication and prognosis for successful ability to wear dentures. Price for dentures confirmed.  Patient agrees to proceed with upper and lower denture fabrication. Procedure:  Upper and lower denture primary impressions in alginate. Lab pour. To [facility] for upper and lower denture custom tray fabrication. RTC for upper and lower denture final impressions.  [name], DDS

## 2015-06-27 ENCOUNTER — Encounter (HOSPITAL_COMMUNITY): Payer: Self-pay | Admitting: Dentistry

## 2015-06-28 ENCOUNTER — Encounter (HOSPITAL_COMMUNITY): Payer: Self-pay | Admitting: Dentistry

## 2015-06-28 ENCOUNTER — Telehealth: Payer: Self-pay | Admitting: *Deleted

## 2015-06-28 NOTE — Telephone Encounter (Signed)
Thanks, [employer], please follow next week

## 2015-06-28 NOTE — Telephone Encounter (Signed)
  Oncology Nurse Navigator Documentation   Navigator Encounter Type: Telephone (06/28/15 1521)      Patient called to report:  Has had ongoing sense of nausea (no emesis) since Tuesday.    Has not been able to eat/drink as usual but drinking 3-4 Ensure, 2 bottles of water, about 1/3 cup chicken broth daily since onset.  Is taking Zofran and Phenergan as prescribed.  He understands that nausea is an expected SE from chemo. I encouraged him to provide update tomorrow when he comes in for next chemo.   Gayleen Orem, RN, BSN, [facility] [phone]

## 2015-06-29 ENCOUNTER — Ambulatory Visit (HOSPITAL_BASED_OUTPATIENT_CLINIC_OR_DEPARTMENT_OTHER): Payer: PPO

## 2015-06-29 ENCOUNTER — Other Ambulatory Visit: Payer: Self-pay | Admitting: *Deleted

## 2015-06-29 ENCOUNTER — Telehealth: Payer: Self-pay | Admitting: *Deleted

## 2015-06-29 ENCOUNTER — Other Ambulatory Visit: Payer: Self-pay | Admitting: Hematology and Oncology

## 2015-06-29 VITALS — BP 128/75 | HR 90 | Temp 99.3°F | Resp 18

## 2015-06-29 DIAGNOSIS — C029 Malignant neoplasm of tongue, unspecified: Secondary | ICD-10-CM

## 2015-06-29 DIAGNOSIS — C01 Malignant neoplasm of base of tongue: Secondary | ICD-10-CM

## 2015-06-29 LAB — COMPREHENSIVE METABOLIC PANEL (CC13)
ALBUMIN: 3.3 g/dL — AB (ref 3.5–5.0)
ALT: 21 U/L (ref 0–55)
AST: 21 U/L (ref 5–34)
Alkaline Phosphatase: 143 U/L (ref 40–150)
Anion Gap: 9 mEq/L (ref 3–11)
BILIRUBIN TOTAL: 0.4 mg/dL (ref 0.20–1.20)
BUN: 17.3 mg/dL (ref 7.0–26.0)
CO2: 27 meq/L (ref 22–29)
CREATININE: 1.1 mg/dL (ref 0.7–1.3)
Calcium: 8.9 mg/dL (ref 8.4–10.4)
Chloride: 101 mEq/L (ref 98–109)
EGFR: 73 mL/min/{1.73_m2} — ABNORMAL LOW (ref >90)
Glucose: 147 mg/dl — ABNORMAL HIGH (ref 70–140)
POTASSIUM: 3.9 meq/L (ref 3.5–5.1)
Sodium: 136 mEq/L (ref 136–145)
Total Protein: 6.9 g/dL (ref 6.4–8.3)

## 2015-06-29 LAB — CBC WITH DIFFERENTIAL/PLATELET
BASO%: 0.3 % (ref 0.0–2.0)
Basophils Absolute: 0 10*3/uL (ref 0.0–0.1)
EOS%: 0.6 % (ref 0.0–7.0)
Eosinophils Absolute: 0 10*3/uL (ref 0.0–0.5)
HCT: 36.3 % — ABNORMAL LOW (ref 38.4–49.9)
HGB: 12.2 g/dL — ABNORMAL LOW (ref 13.0–17.1)
LYMPH%: 7.8 % — ABNORMAL LOW (ref 14.0–49.0)
MCH: 28.7 pg (ref 27.2–33.4)
MCHC: 33.5 g/dL (ref 32.0–36.0)
MCV: 85.6 fL (ref 79.3–98.0)
MONO#: 0.1 10*3/uL (ref 0.1–0.9)
MONO%: 3.4 % (ref 0.0–14.0)
NEUT#: 2.7 10*3/uL (ref 1.5–6.5)
NEUT%: 87.9 % — ABNORMAL HIGH (ref 39.0–75.0)
Platelets: 396 10*3/uL (ref 140–400)
RBC: 4.24 10*6/uL (ref 4.20–5.82)
RDW: 14.4 % (ref 11.0–14.6)
WBC: 3.1 10*3/uL — ABNORMAL LOW (ref 4.0–10.3)
lymph#: 0.2 10*3/uL — ABNORMAL LOW (ref 0.9–3.3)

## 2015-06-29 MED ORDER — SCOPOLAMINE 1 MG/3DAYS TD PT72: 1.0000 | MEDICATED_PATCH | TRANSDERMAL | Status: DC

## 2015-06-29 MED ORDER — HYDROMORPHONE HCL 1 MG/ML IJ SOLN
2.0000 mg | INTRAMUSCULAR | Status: DC | PRN
  Administered 2015-06-29: 2 mg via INTRAVENOUS
  Filled 2015-06-29: qty 2

## 2015-06-29 MED ORDER — FENTANYL 25 MCG/HR TD PT72: 25.0000 ug | MEDICATED_PATCH | TRANSDERMAL | Status: DC

## 2015-06-29 MED ORDER — SODIUM CHLORIDE 0.9 % IJ SOLN
10.0000 mL | INTRAMUSCULAR | Status: DC | PRN
  Administered 2015-06-29: 10 mL
  Filled 2015-06-29: qty 10

## 2015-06-29 MED ORDER — SODIUM CHLORIDE 0.9 % IV SOLN
Freq: Once | INTRAVENOUS | Status: AC
  Administered 2015-06-29: 13:00:00 via INTRAVENOUS
  Filled 2015-06-29: qty 4

## 2015-06-29 MED ORDER — HEPARIN SOD (PORK) LOCK FLUSH 100 UNIT/ML IV SOLN
500.0000 [IU] | Freq: Once | INTRAVENOUS | Status: AC | PRN
  Administered 2015-06-29: 500 [IU]
  Filled 2015-06-29: qty 5

## 2015-06-29 MED ORDER — SODIUM CHLORIDE 0.9 % IV SOLN
Freq: Once | INTRAVENOUS | Status: AC
  Administered 2015-06-29: 13:00:00 via INTRAVENOUS

## 2015-06-29 MED ORDER — HYDROMORPHONE HCL 4 MG PO TABS: 4.0000 mg | ORAL_TABLET | ORAL | Status: DC | PRN

## 2015-06-29 NOTE — Telephone Encounter (Signed)
  Oncology Nurse Navigator Documentation   Navigator Encounter Type: Telephone (06/29/15 1057)     Patient called stating he needs refill Rx for dilaudid, can pick up when he comes for tmt today.  [name] informed.  Gayleen Orem, RN, BSN, [facility] [phone]

## 2015-06-29 NOTE — Patient Instructions (Signed)
Dehydration, Adult Dehydration is when you lose more fluids from the body than you take in. Vital organs like the kidneys, brain, and heart cannot function without a proper amount of fluids and salt. Any loss of fluids from the body can cause dehydration.  CAUSES   Vomiting.  Diarrhea.  Excessive sweating.  Excessive urine output.  Fever. SYMPTOMS  Mild dehydration  Thirst.  Dry lips.  Slightly dry mouth. Moderate dehydration  Very dry mouth.  Sunken eyes.  Skin does not bounce back quickly when lightly pinched and released.  Dark urine and decreased urine production.  Decreased tear production.  Headache. Severe dehydration  Very dry mouth.  Extreme thirst.  Rapid, weak pulse (more than 100 beats per minute at rest).  Cold hands and feet.  Not able to sweat in spite of heat and temperature.  Rapid breathing.  Blue lips.  Confusion and lethargy.  Difficulty being awakened.  Minimal urine production.  No tears. DIAGNOSIS  Your caregiver will diagnose dehydration based on your symptoms and your exam. Blood and urine tests will help confirm the diagnosis. The diagnostic evaluation should also identify the cause of dehydration. TREATMENT  Treatment of mild or moderate dehydration can often be done at home by increasing the amount of fluids that you drink. It is best to drink small amounts of fluid more often. Drinking too much at one time can make vomiting worse. Refer to the home care instructions below. Severe dehydration needs to be treated at the hospital where you will probably be given intravenous (IV) fluids that contain water and electrolytes. HOME CARE INSTRUCTIONS   Ask your caregiver about specific rehydration instructions.  Drink enough fluids to keep your urine clear or pale yellow.  Drink small amounts frequently if you have nausea and vomiting.  Eat as you normally do.  Avoid:  Foods or drinks high in sugar.  Carbonated  drinks.  Juice.  Extremely hot or cold fluids.  Drinks with caffeine.  Fatty, greasy foods.  Alcohol.  Tobacco.  Overeating.  Gelatin desserts.  Wash your hands well to avoid spreading bacteria and viruses.  Only take over-the-counter or prescription medicines for pain, discomfort, or fever as directed by your caregiver.  Ask your caregiver if you should continue all prescribed and over-the-counter medicines.  Keep all follow-up appointments with your caregiver. SEEK MEDICAL CARE IF:  You have abdominal pain and it increases or stays in one area (localizes).  You have a rash, stiff neck, or severe headache.  You are irritable, sleepy, or difficult to awaken.  You are weak, dizzy, or extremely thirsty. SEEK IMMEDIATE MEDICAL CARE IF:   You are unable to keep fluids down or you get worse despite treatment.  You have frequent episodes of vomiting or diarrhea.  You have blood or green matter (bile) in your vomit.  You have blood in your stool or your stool looks black and tarry.  You have not urinated in 6 to 8 hours, or you have only urinated a small amount of very dark urine.  You have a fever.  You faint. MAKE SURE YOU:   Understand these instructions.  Will watch your condition.  Will get help right away if you are not doing well or get worse. Document Released: 11/17/2005 Document Revised: 02/09/2012 Document Reviewed: 07/07/2011 ExitCare Patient Information 2015 [employer]. This information is not intended to replace advice given to you by your health care provider. Make sure you discuss any questions you have with your health care   provider.  

## 2015-06-29 NOTE — Progress Notes (Signed)
Upon arrival to infusion area, pt stated he did not feel well. States his throat is very sore, is having trouble swallowing and has not consumed as much fluid in the last few days as he needed to.  Also states his nausea has been daily and not relieved with his zofran @ home.  Also states he has had diarrhea for the last 2 days. Generally feels bad. See  VS. Temp 99.3  Spoke with [name]. Received orders for CBC and CMET and to begin IVF (.9NS).  [name] at pt's chairside for exam. Decision made to give pt 1000 ml .9NS over 2 hours, hold Erbitux for today. Also received orders for IV dilaudid and IV Zofran.  Labs obtained.

## 2015-07-04 ENCOUNTER — Encounter (HOSPITAL_COMMUNITY): Payer: Self-pay | Admitting: Dentistry

## 2015-07-04 ENCOUNTER — Ambulatory Visit (HOSPITAL_COMMUNITY): Payer: Self-pay | Admitting: Dentistry

## 2015-07-04 VITALS — BP 156/75 | HR 86 | Temp 99.1°F

## 2015-07-04 DIAGNOSIS — R682 Dry mouth, unspecified: Secondary | ICD-10-CM

## 2015-07-04 DIAGNOSIS — C01 Malignant neoplasm of base of tongue: Secondary | ICD-10-CM

## 2015-07-04 DIAGNOSIS — K08109 Complete loss of teeth, unspecified cause, unspecified class: Secondary | ICD-10-CM

## 2015-07-04 DIAGNOSIS — K117 Disturbances of salivary secretion: Secondary | ICD-10-CM

## 2015-07-04 DIAGNOSIS — Z923 Personal history of irradiation: Secondary | ICD-10-CM

## 2015-07-04 DIAGNOSIS — K0820 Unspecified atrophy of edentulous alveolar ridge: Secondary | ICD-10-CM

## 2015-07-04 DIAGNOSIS — Z463 Encounter for fitting and adjustment of dental prosthetic device: Secondary | ICD-10-CM

## 2015-07-04 NOTE — Patient Instructions (Signed)
Return to clinic as scheduled for continued upper lower complete denture fabrication. Dr. Sanaya Gwilliam 

## 2015-07-04 NOTE — Progress Notes (Signed)
07/04/2015  Patient Name:   Shane Cantu Date of Birth:   28-Jan-1956 Medical Record Number: [id number]  BP 156/75 mmHg  Pulse 86  Temp(Src) 99.1 F (37.3 C) (Oral)  [name] presents for continued upper and lower complete denture fabrication.  Procedure:  Upper and lower border molding and final impressions in Aquasil. Patient tolerated procedure well. To [facility] for custom baseplates with rims. Return to clinic for upper and lower complete denture jaw relations.  [name], DDS

## 2015-07-05 ENCOUNTER — Telehealth: Payer: Self-pay | Admitting: Hematology and Oncology

## 2015-07-05 ENCOUNTER — Other Ambulatory Visit: Payer: Self-pay | Admitting: *Deleted

## 2015-07-05 NOTE — Telephone Encounter (Signed)
lvm for pt regarding to 8.5 added lab.Marland KitchenMarland KitchenMarland Kitchen

## 2015-07-06 ENCOUNTER — Encounter: Payer: Self-pay | Admitting: Nutrition

## 2015-07-06 ENCOUNTER — Ambulatory Visit: Payer: Self-pay | Admitting: Hematology and Oncology

## 2015-07-06 ENCOUNTER — Telehealth: Payer: Self-pay | Admitting: *Deleted

## 2015-07-06 ENCOUNTER — Other Ambulatory Visit: Payer: Self-pay

## 2015-07-06 ENCOUNTER — Ambulatory Visit: Payer: Self-pay

## 2015-07-06 NOTE — Telephone Encounter (Signed)
Pt states he pulled his back out last night and unable to make it here today as it is difficult to move.  He denies n/v,  States he is drinking and eating better.  He has pain meds he can take for his back and thinks he mostly just needs to rest.   Instructed pt to keep his appts as scheduled for next Friday but to call us sooner if any problems.  He verbalized understanding.

## 2015-07-12 ENCOUNTER — Ambulatory Visit (HOSPITAL_COMMUNITY): Payer: Self-pay | Admitting: Dentistry

## 2015-07-12 ENCOUNTER — Encounter (HOSPITAL_COMMUNITY): Payer: Self-pay | Admitting: Dentistry

## 2015-07-12 VITALS — BP 138/82 | HR 88 | Temp 96.3°F

## 2015-07-12 DIAGNOSIS — K08109 Complete loss of teeth, unspecified cause, unspecified class: Secondary | ICD-10-CM

## 2015-07-12 DIAGNOSIS — C01 Malignant neoplasm of base of tongue: Secondary | ICD-10-CM

## 2015-07-12 DIAGNOSIS — K117 Disturbances of salivary secretion: Secondary | ICD-10-CM

## 2015-07-12 DIAGNOSIS — K0820 Unspecified atrophy of edentulous alveolar ridge: Secondary | ICD-10-CM

## 2015-07-12 DIAGNOSIS — Z463 Encounter for fitting and adjustment of dental prosthetic device: Secondary | ICD-10-CM

## 2015-07-12 DIAGNOSIS — M264 Malocclusion, unspecified: Secondary | ICD-10-CM

## 2015-07-12 DIAGNOSIS — R682 Dry mouth, unspecified: Secondary | ICD-10-CM

## 2015-07-12 DIAGNOSIS — Z923 Personal history of irradiation: Secondary | ICD-10-CM

## 2015-07-12 NOTE — Patient Instructions (Signed)
RTC as scheduled for denture fabrication. Dr. Enrique Sack

## 2015-07-12 NOTE — Progress Notes (Signed)
07/12/2015  Patient Name:   Guadalupe Nickless Walter Date of Birth:   06/24/1956 Medical Record Number: [id number]  BP 138/82 mmHg  Pulse 88  Temp(Src) 96.3 F (35.7 C)   Aaron Mose Lomeli presents for continued denture fabrication.  Procedure:  Upper and lower denture Jaw relations with aluwax bite registration. 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 Patient agrees to tooth selection of 22E, H, and 10 degree posteriors to match with Portrait A2 shade. Lab may consider flat plane occlusion as needed. Patient tolerated procedure well. RTC for denture wax try in.   [name], DDS

## 2015-07-13 ENCOUNTER — Encounter: Payer: Self-pay | Admitting: Hematology and Oncology

## 2015-07-13 ENCOUNTER — Ambulatory Visit: Payer: PPO | Admitting: Nutrition

## 2015-07-13 ENCOUNTER — Telehealth: Payer: Self-pay | Admitting: Hematology and Oncology

## 2015-07-13 ENCOUNTER — Ambulatory Visit (HOSPITAL_BASED_OUTPATIENT_CLINIC_OR_DEPARTMENT_OTHER): Payer: PPO | Admitting: Hematology and Oncology

## 2015-07-13 ENCOUNTER — Other Ambulatory Visit: Payer: Self-pay | Admitting: Hematology and Oncology

## 2015-07-13 ENCOUNTER — Ambulatory Visit (HOSPITAL_BASED_OUTPATIENT_CLINIC_OR_DEPARTMENT_OTHER): Payer: PPO

## 2015-07-13 ENCOUNTER — Other Ambulatory Visit (HOSPITAL_BASED_OUTPATIENT_CLINIC_OR_DEPARTMENT_OTHER): Payer: PPO

## 2015-07-13 VITALS — BP 118/80 | HR 95 | Temp 98.5°F | Resp 18 | Ht 69.0 in | Wt 227.0 lb

## 2015-07-13 DIAGNOSIS — M542 Cervicalgia: Secondary | ICD-10-CM

## 2015-07-13 DIAGNOSIS — R110 Nausea: Secondary | ICD-10-CM | POA: Insufficient documentation

## 2015-07-13 DIAGNOSIS — G8929 Other chronic pain: Secondary | ICD-10-CM

## 2015-07-13 DIAGNOSIS — Z95828 Presence of other vascular implants and grafts: Secondary | ICD-10-CM

## 2015-07-13 DIAGNOSIS — Z5112 Encounter for antineoplastic immunotherapy: Secondary | ICD-10-CM | POA: Diagnosis not present

## 2015-07-13 DIAGNOSIS — D701 Agranulocytosis secondary to cancer chemotherapy: Secondary | ICD-10-CM

## 2015-07-13 DIAGNOSIS — D72819 Decreased white blood cell count, unspecified: Secondary | ICD-10-CM | POA: Diagnosis not present

## 2015-07-13 DIAGNOSIS — C01 Malignant neoplasm of base of tongue: Secondary | ICD-10-CM

## 2015-07-13 DIAGNOSIS — D6481 Anemia due to antineoplastic chemotherapy: Secondary | ICD-10-CM

## 2015-07-13 DIAGNOSIS — Z5111 Encounter for antineoplastic chemotherapy: Secondary | ICD-10-CM | POA: Diagnosis not present

## 2015-07-13 DIAGNOSIS — C029 Malignant neoplasm of tongue, unspecified: Secondary | ICD-10-CM

## 2015-07-13 DIAGNOSIS — T451X5A Adverse effect of antineoplastic and immunosuppressive drugs, initial encounter: Secondary | ICD-10-CM

## 2015-07-13 LAB — COMPREHENSIVE METABOLIC PANEL (CC13)
ALK PHOS: 126 U/L (ref 40–150)
ALT: 22 U/L (ref 0–55)
AST: 21 U/L (ref 5–34)
Albumin: 3.4 g/dL — ABNORMAL LOW (ref 3.5–5.0)
Anion Gap: 11 mEq/L (ref 3–11)
BUN: 18 mg/dL (ref 7.0–26.0)
CALCIUM: 9.8 mg/dL (ref 8.4–10.4)
CHLORIDE: 101 meq/L (ref 98–109)
CO2: 28 mEq/L (ref 22–29)
CREATININE: 1.1 mg/dL (ref 0.7–1.3)
EGFR: 73 mL/min/{1.73_m2} — ABNORMAL LOW (ref >90)
Glucose: 139 mg/dl (ref 70–140)
Potassium: 4 mEq/L (ref 3.5–5.1)
Sodium: 141 mEq/L (ref 136–145)
Total Bilirubin: 0.31 mg/dL (ref 0.20–1.20)
Total Protein: 7.4 g/dL (ref 6.4–8.3)

## 2015-07-13 LAB — CBC WITH DIFFERENTIAL/PLATELET
BASO%: 0.5 % (ref 0.0–2.0)
Basophils Absolute: 0 10*3/uL (ref 0.0–0.1)
EOS%: 0.9 % (ref 0.0–7.0)
Eosinophils Absolute: 0 10*3/uL (ref 0.0–0.5)
HEMATOCRIT: 37.6 % — AB (ref 38.4–49.9)
HEMOGLOBIN: 12.3 g/dL — AB (ref 13.0–17.1)
LYMPH%: 12.5 % — ABNORMAL LOW (ref 14.0–49.0)
MCH: 28.8 pg (ref 27.2–33.4)
MCHC: 32.8 g/dL (ref 32.0–36.0)
MCV: 87.7 fL (ref 79.3–98.0)
MONO#: 0.5 10*3/uL (ref 0.1–0.9)
MONO%: 15.7 % — ABNORMAL HIGH (ref 0.0–14.0)
NEUT%: 70.4 % (ref 39.0–75.0)
NEUTROS ABS: 2 10*3/uL (ref 1.5–6.5)
Platelets: 209 10*3/uL (ref 140–400)
RBC: 4.28 10*6/uL (ref 4.20–5.82)
RDW: 15.8 % — AB (ref 11.0–14.6)
WBC: 2.9 10*3/uL — ABNORMAL LOW (ref 4.0–10.3)
lymph#: 0.4 10*3/uL — ABNORMAL LOW (ref 0.9–3.3)

## 2015-07-13 LAB — MAGNESIUM (CC13): Magnesium: 2 mg/dl (ref 1.5–2.5)

## 2015-07-13 MED ORDER — SODIUM CHLORIDE 0.9 % IV SOLN
Freq: Once | INTRAVENOUS | Status: AC
  Administered 2015-07-13: 11:00:00 via INTRAVENOUS

## 2015-07-13 MED ORDER — SODIUM CHLORIDE 0.9 % IV SOLN
685.0000 mg | Freq: Once | INTRAVENOUS | Status: AC
  Administered 2015-07-13: 690 mg via INTRAVENOUS
  Filled 2015-07-13: qty 69

## 2015-07-13 MED ORDER — DIPHENHYDRAMINE HCL 50 MG/ML IJ SOLN
INTRAMUSCULAR | Status: AC
  Filled 2015-07-13: qty 1

## 2015-07-13 MED ORDER — SODIUM CHLORIDE 0.9 % IV SOLN
1000.0000 mg/m2/d | INTRAVENOUS | Status: DC
  Administered 2015-07-13: 9350 mg via INTRAVENOUS
  Filled 2015-07-13: qty 187

## 2015-07-13 MED ORDER — SODIUM CHLORIDE 0.9 % IJ SOLN
10.0000 mL | INTRAMUSCULAR | Status: DC | PRN
  Administered 2015-07-13: 10 mL via INTRAVENOUS
  Filled 2015-07-13: qty 10

## 2015-07-13 MED ORDER — HYDROMORPHONE HCL 4 MG/ML IJ SOLN
2.0000 mg | INTRAMUSCULAR | Status: DC | PRN
  Administered 2015-07-13: 2 mg via INTRAVENOUS

## 2015-07-13 MED ORDER — HYDROMORPHONE HCL 4 MG/ML IJ SOLN
INTRAMUSCULAR | Status: AC
  Filled 2015-07-13: qty 1

## 2015-07-13 MED ORDER — DIPHENHYDRAMINE HCL 50 MG/ML IJ SOLN
50.0000 mg | Freq: Once | INTRAMUSCULAR | Status: AC
  Administered 2015-07-13: 50 mg via INTRAVENOUS

## 2015-07-13 MED ORDER — PALONOSETRON HCL INJECTION 0.25 MG/5ML
0.2500 mg | Freq: Once | INTRAVENOUS | Status: AC
  Administered 2015-07-13: 0.25 mg via INTRAVENOUS

## 2015-07-13 MED ORDER — PALONOSETRON HCL INJECTION 0.25 MG/5ML
INTRAVENOUS | Status: AC
  Filled 2015-07-13: qty 5

## 2015-07-13 MED ORDER — SODIUM CHLORIDE 0.9 % IV SOLN
Freq: Once | INTRAVENOUS | Status: AC
  Administered 2015-07-13: 13:00:00 via INTRAVENOUS
  Filled 2015-07-13: qty 5

## 2015-07-13 MED ORDER — CETUXIMAB CHEMO IV INJECTION 200 MG/100ML
250.0000 mg/m2 | Freq: Once | INTRAVENOUS | Status: AC
  Administered 2015-07-13: 600 mg via INTRAVENOUS
  Filled 2015-07-13: qty 300

## 2015-07-13 MED ORDER — METHADONE HCL 10 MG PO TABS: 20.0000 mg | ORAL_TABLET | Freq: Three times a day (TID) | ORAL | Status: DC

## 2015-07-13 NOTE — Telephone Encounter (Signed)
Appointments made and avs will be printed in chemo  °

## 2015-07-13 NOTE — Assessment & Plan Note (Signed)
This is likely due to recent treatment. The patient denies recent history of fevers, cough, chills, diarrhea or dysuria. He is asymptomatic from the leukopenia. I will observe for now.  I will continue the chemotherapy at current dose without dosage adjustment.  If the leukopenia gets progressive worse in the future, I might have to delay his treatment or adjust the chemotherapy dose. 

## 2015-07-13 NOTE — Progress Notes (Signed)
Nutrition follow-up completed with patient treated for metastatic tongue cancer. Patient has experienced severe nausea but denies vomiting. Reports he has eaten very little for 2 weeks. Weight decreased and documented as 227 pounds August 12, down from 238.3 pounds July 21. Patient drinking 3 high protein boost daily.  Nutrition diagnosis: Inadequate oral intake continues.  Intervention:  Patient educated on strategies for eating with nausea. Recommended bland, dry salty food in small amounts. Encouraged patient to continue boost, which is well tolerated.  However, recommended patient changed to boost plus for additional calories and protein. Coupons provided.  Questions were answered.  Teach back method used.  Monitoring, evaluation, goals: Patient will tolerate additional calories and protein to minimize loss of lean body mass.  Next visit: Friday, August 26 during infusion.  **Disclaimer: This note was dictated with voice recognition software. Similar sounding words can inadvertently be transcribed and this note may contain transcription errors which may not have been corrected upon publication of note.**

## 2015-07-13 NOTE — Progress Notes (Signed)
Pt reports neck pain rated 8/10. [name] notified, okay to release dilaudid 2 mg IVP order from supportive therapy plan.

## 2015-07-13 NOTE — Progress Notes (Signed)
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   04/13/2015 Initial Biopsy Accession [id number]:  Lymph node, needle/core biopsy - SCC, p16 positive.   04/18/2015 Initial Diagnosis Tongue cancer   04/24/2015 Imaging PET CT showed tongue cancer, lung nodule and possible bone mets   04/26/2015 Surgery He had dental extractions   05/03/2015 Imaging CT neck with contrast: L base of tongue with regional adenopathy, metastatic disease C2, C3, T2 vertebra; posterior L fourth rib.   05/09/2015 Procedure Port-a-cath placed.   05/11/2015 - 05/23/2015 Radiation Therapy He received palliative radiation therapy   06/01/2015 -  Chemotherapy He started cycle 1 of carboplatin, 5-FU and cetuximab   06/04/2015 - 06/05/2015 Hospital Admission He was admitted to the hospital after syncopal episode and had right nondisplaced fibular fracture    INTERVAL HISTORY: Please see below for problem oriented charting. He returns for further follow-up. He is seen prior to cycle 3 of chemotherapy. He has persistent neck pain and had profound nausea without vomiting. He denies peripheral neuropathy.  REVIEW OF SYSTEMS:   Constitutional: Denies fevers, chills or abnormal weight loss Eyes: Denies blurriness of vision Ears, nose, mouth, throat, and face: Denies mucositis or sore throat Respiratory: Denies cough, dyspnea or wheezes Cardiovascular: Denies palpitation, chest discomfort or lower extremity swelling Skin: Denies abnormal skin rashes Lymphatics: Denies new  lymphadenopathy or easy bruising Neurological:Denies numbness, tingling or new weaknesses Behavioral/Psych: Mood is stable, no new changes  All other systems were reviewed with the patient and are negative.  I have reviewed the past medical history, past surgical history, social history and family history with the patient and they are unchanged from previous note.  ALLERGIES:  is allergic to morphine and related.  MEDICATIONS:  Current Outpatient Prescriptions  Medication Sig Dispense Refill  . amLODipine (NORVASC) 5 MG tablet Take 5 mg by mouth daily.     . Armodafinil 250 MG tablet Take 250 mg by mouth daily.    Marland Kitchen aspirin EC 81 MG tablet Take 81 mg by mouth daily.    . clindamycin (CLINDAGEL) 1 % gel APPLY TO AFFECTED AREA(S) TWICE DAILY 60 g 0  . dexamethasone (DECADRON) 4 MG tablet Take 1 tablet (4 mg total) by mouth 2 (two) times daily. 60 tablet 0  . diphenhydrAMINE (BENADRYL) 25 mg capsule Take 25 mg by mouth every 6 (six) hours as needed for itching.    Marland Kitchen emollient (BIAFINE) cream Apply 1 application topically 3 (three) times daily as needed (pain).     . famotidine (PEPCID) 20 MG tablet Take 20 mg by mouth 2 (two) times daily.    . fentaNYL (DURAGESIC - DOSED MCG/HR) 25 MCG/HR patch Place 1 patch (25 mcg total) onto the skin every 3 (three) days. 5 patch 0  . gabapentin (NEURONTIN) 100 MG capsule Take 600 mg by mouth 2 (two) times daily. Takes with supper and at bedtime    . hydrocortisone cream 0.5 % Apply topically 3 (three) times daily. 30 g 0  . HYDROmorphone (DILAUDID) 4 MG tablet Take 1 tablet (4 mg  total) by mouth every 4 (four) hours as needed for severe pain. 60 tablet 0  . ibuprofen (ADVIL,MOTRIN) 200 MG tablet Take 400 mg by mouth every 6 (six) hours as needed for mild pain or moderate pain.    Marland Kitchen lansoprazole (PREVACID) 30 MG capsule Take 30 mg by mouth daily at 12 noon.    . lidocaine (LIDODERM) 5 % Place 1 patch onto the skin daily. Remove & Discard patch within 12  hours or as directed by MD (Patient taking differently: Place 1 patch onto the skin daily as needed (pain). Remove & Discard patch within 12 hours or as directed by MD) 30 patch 0  . lidocaine (XYLOCAINE) 2 % solution Patient: Mix 1part 2% viscous lidocaine, 1part H20. Swish and/or swallow 26mL of this mixture, 62min before meals and at bedtime, up to QID (Patient taking differently: Use as directed 20 mLs in the mouth or throat every 6 (six) hours as needed. Patient: Mix 1part 2% viscous lidocaine, 1part H20. Swish and/or swallow 68mL of this mixture, 82min before meals and at bedtime, up to QID) 100 mL 4  . lidocaine-prilocaine (EMLA) cream Apply 1 application topically as needed. To Port a cath site one hour prior to needle stick 30 g 3  . Misc. Devices (PUMP IN STYLE ADVANCED) MISC Inject 1 cartridge into the vein See admin instructions. Adrucil 9350 mg  2.90ml/hr. Runs for 96 hours    . ondansetron (ZOFRAN) 8 MG tablet Take 1 tablet (8 mg total) by mouth every 8 (eight) hours as needed for nausea. 30 tablet 3  . oxymetazoline (AFRIN) 0.05 % nasal spray Place 2 sprays into both nostrils daily as needed for congestion.    . polyethylene glycol powder (GLYCOLAX/MIRALAX) powder Take 17 g by mouth 2 (two) times daily. 255 g 0  . promethazine (PHENERGAN) 25 MG tablet Take 1 tablet (25 mg total) by mouth every 6 (six) hours as needed for nausea. 30 tablet 3  . scopolamine (TRANSDERM-SCOP) 1 MG/3DAYS Place 1 patch (1.5 mg total) onto the skin every 3 (three) days. 10 patch 12  . sucralfate (CARAFATE) 1 G tablet Dissolve 1 tablet in 10 mL H20 and swallow up to QID as needed for sore throat (Patient taking differently: Take 1 g by mouth 4 (four) times daily as needed (sore throat). Dissolve 1 tablet in 10 mL H20 and swallow up to QID as needed for sore throat) 40 tablet 3  . traZODone (DESYREL) 100 MG tablet Take 100 mg by mouth at bedtime.    . methadone (DOLOPHINE) 10 MG tablet Take 2 tablets (20 mg total) by  mouth every 8 (eight) hours. 90 tablet 0   No current facility-administered medications for this visit.   Facility-Administered Medications Ordered in Other Visits  Medication Dose Route Frequency Provider Last Rate Last Dose  . CARBOplatin (PARAPLATIN) 690 mg in sodium chloride 0.9 % 250 mL chemo infusion  690 mg Intravenous Once [name], MD      . cetuximab (ERBITUX) chemo infusion 600 mg  250 mg/m2 (Treatment Plan Actual) Intravenous Once [name], MD      . fluorouracil (ADRUCIL) 9,350 mg in sodium chloride 0.9 % 63 mL chemo infusion  1,000 mg/m2/day (Treatment Plan Actual) Intravenous 4 days [name], MD      . fosaprepitant (EMEND) 150 mg, dexamethasone (DECADRON) 6 mg in sodium chloride 0.9 % 145 mL IVPB   Intravenous Once [name], MD      . HYDROmorphone (DILAUDID) injection 2  mg  2 mg Intravenous Q2H PRN [name], MD   2 mg at 07/13/15 1131  . palonosetron (ALOXI) injection 0.25 mg  0.25 mg Intravenous Once [name], MD        PHYSICAL EXAMINATION: ECOG PERFORMANCE STATUS: 2 - Symptomatic, <50% confined to bed  Filed Vitals:   07/13/15 1019  BP: 118/80  Pulse: 95  Temp: 98.5 F (36.9 C)  Resp: 18   Filed Weights   07/13/15 1019  Weight: 227 lb (102.967 kg)    GENERAL:alert, no distress and comfortable SKIN: skin color, texture, turgor are normal, no rashes or significant lesions EYES: normal, Conjunctiva are pink and non-injected, sclera clear OROPHARYNX:no exudate, no erythema and lips, buccal mucosa, and tongue normal  NECK: supple, thyroid normal size, non-tender, without nodularity LYMPH:  no palpable lymphadenopathy in the cervical, axillary or inguinal LUNGS: clear to auscultation and percussion with normal breathing effort HEART: regular rate & rhythm and no murmurs and no lower extremity edema ABDOMEN:abdomen soft, non-tender and normal bowel sounds Musculoskeletal:no cyanosis of digits and no clubbing  NEURO: alert & oriented x 3 with fluent  speech, no focal motor/sensory deficits  LABORATORY DATA:  I have reviewed the data as listed    Component Value Date/Time   NA 141 07/13/2015 1002   NA 138 06/05/2015 0409   K 4.0 07/13/2015 1002   K 4.5 06/05/2015 0409   CL 101 06/05/2015 0409   CO2 28 07/13/2015 1002   CO2 30 06/05/2015 0409   GLUCOSE 139 07/13/2015 1002   GLUCOSE 194* 06/05/2015 0409   BUN 18.0 07/13/2015 1002   BUN 22* 06/05/2015 0409   CREATININE 1.1 07/13/2015 1002   CREATININE 1.20 06/05/2015 0409   CALCIUM 9.8 07/13/2015 1002   CALCIUM 8.8* 06/05/2015 0409   PROT 7.4 07/13/2015 1002   PROT 7.9 11/20/2014 1115   ALBUMIN 3.4* 07/13/2015 1002   ALBUMIN 4.1 11/20/2014 1115   AST 21 07/13/2015 1002   AST 22 11/20/2014 1115   ALT 22 07/13/2015 1002   ALT 22 11/20/2014 1115   ALKPHOS 126 07/13/2015 1002   ALKPHOS 117 11/20/2014 1115   BILITOT 0.31 07/13/2015 1002   BILITOT 0.4 11/20/2014 1115   GFRNONAA >60 06/05/2015 0409   GFRAA >60 06/05/2015 0409    No results found for: SPEP, UPEP  Lab Results  Component Value Date   WBC 2.9* 07/13/2015   NEUTROABS 2.0 07/13/2015   HGB 12.3* 07/13/2015   HCT 37.6* 07/13/2015   MCV 87.7 07/13/2015   PLT 209 07/13/2015      Chemistry      Component Value Date/Time   NA 141 07/13/2015 1002   NA 138 06/05/2015 0409   K 4.0 07/13/2015 1002   K 4.5 06/05/2015 0409   CL 101 06/05/2015 0409   CO2 28 07/13/2015 1002   CO2 30 06/05/2015 0409   BUN 18.0 07/13/2015 1002   BUN 22* 06/05/2015 0409   CREATININE 1.1 07/13/2015 1002   CREATININE 1.20 06/05/2015 0409      Component Value Date/Time   CALCIUM 9.8 07/13/2015 1002   CALCIUM 8.8* 06/05/2015 0409   ALKPHOS 126 07/13/2015 1002   ALKPHOS 117 11/20/2014 1115   AST 21 07/13/2015 1002   AST 22 11/20/2014 1115   ALT 22 07/13/2015 1002   ALT 22 11/20/2014 1115   BILITOT 0.31 07/13/2015 1002   BILITOT 0.4 11/20/2014 1115        ASSESSMENT & PLAN:  Tongue cancer He tolerates treatment  poorly  with multiple side effects. I will proceed with treatment today without dose adjustment. I plan to order a PET/CT scan for repeat staging before I see him back for cycle 3 of treatment. If he has excellent response to treatment, I might want to stop chemotherapy and just switch him to maintenance Erbitux only  Anemia due to antineoplastic chemotherapy This is likely due to recent treatment. The patient denies recent history of bleeding such as epistaxis, hematuria or hematochezia. He is asymptomatic from the anemia. I will observe for now.  He does not require transfusion now. I will continue the chemotherapy at current dose without dosage adjustment.  If the anemia gets progressive worse in the future, I might have to delay his treatment or adjust the chemotherapy dose.     Leukopenia due to antineoplastic chemotherapy This is likely due to recent treatment. The patient denies recent history of fevers, cough, chills, diarrhea or dysuria. He is asymptomatic from the leukopenia. I will observe for now.  I will continue the chemotherapy at current dose without dosage adjustment.  If the leukopenia gets progressive worse in the future, I might have to delay his treatment or adjust the chemotherapy dose.      Chemotherapy-induced nausea He has profound chemotherapy-induced nausea. I will modify his anti-emetics. I will attempt to change his pain medication to see if this would be a contribution during problem to his nausea  Chronic neck pain The patient had acute on chronic back pain/neck pain. Duragesic patches are costing too much. I refill his prescription hydromorphone. I also recommend he add lidocaine patch at the site of pain. I plan to switch him to methadone and I will see him back next week to reassess pain control     Orders Placed This Encounter  Procedures  . NM PET Image Restag (PS) Skull Base To Thigh    Standing Status: Future     Number of Occurrences:      Standing  Expiration Date: 09/11/2016    Order Specific Question:  Reason for Exam (SYMPTOM  OR DIAGNOSIS REQUIRED)    Answer:  staging tongue ca, assess response to Rx    Order Specific Question:  Preferred imaging location?    Answer:  [facility]   All questions were answered. The patient knows to call the clinic with any problems, questions or concerns. No barriers to learning was detected. I spent 30 minutes counseling the patient face to face. The total time spent in the appointment was 40 minutes and more than 50% was on counseling and review of test results     [facility], [place], MD 07/13/2015 12:03 PM

## 2015-07-13 NOTE — Assessment & Plan Note (Signed)
He has profound chemotherapy-induced nausea. I will modify his anti-emetics. I will attempt to change his pain medication to see if this would be a contribution during problem to his nausea

## 2015-07-13 NOTE — Assessment & Plan Note (Signed)
This is likely due to recent treatment. The patient denies recent history of bleeding such as epistaxis, hematuria or hematochezia. He is asymptomatic from the anemia. I will observe for now.  He does not require transfusion now. I will continue the chemotherapy at current dose without dosage adjustment.  If the anemia gets progressive worse in the future, I might have to delay his treatment or adjust the chemotherapy dose.  

## 2015-07-13 NOTE — Assessment & Plan Note (Addendum)
The patient had acute on chronic back pain/neck pain. Duragesic patches are costing too much. I refill his prescription hydromorphone. I also recommend he add lidocaine patch at the site of pain. I plan to switch him to methadone and I will see him back next week to reassess pain control

## 2015-07-13 NOTE — Patient Instructions (Addendum)
[facility] Discharge Instructions for Patients Receiving Chemotherapy  Today you received the following chemotherapy agents: Erbitux, Carboplatin, and Adrucil.   To help prevent nausea and vomiting after your treatment, we encourage you to take your nausea medication as directed.  You received Aloxi and Emend today for nausea.  Do not take your Zofran for 3 days.   If you develop nausea and vomiting that is not controlled by your nausea medication, call the clinic.   BELOW ARE SYMPTOMS THAT SHOULD BE REPORTED IMMEDIATELY:  *FEVER GREATER THAN 100.5 F  *CHILLS WITH OR WITHOUT FEVER  NAUSEA AND VOMITING THAT IS NOT CONTROLLED WITH YOUR NAUSEA MEDICATION  *UNUSUAL SHORTNESS OF BREATH  *UNUSUAL BRUISING OR BLEEDING  TENDERNESS IN MOUTH AND THROAT WITH OR WITHOUT PRESENCE OF ULCERS  *URINARY PROBLEMS  *BOWEL PROBLEMS  UNUSUAL RASH Items with * indicate a potential emergency and should be followed up as soon as possible.  Feel free to call the clinic you have any questions or concerns. The clinic phone number is [phone].  Please show the [facility] at check-in to the Emergency Department and triage nurse.

## 2015-07-13 NOTE — Assessment & Plan Note (Signed)
He tolerates treatment poorly with multiple side effects. I will proceed with treatment today without dose adjustment. I plan to order a PET/CT scan for repeat staging before I see him back for cycle 3 of treatment. If he has excellent response to treatment, I might want to stop chemotherapy and just switch him to maintenance Erbitux only

## 2015-07-13 NOTE — Patient Instructions (Signed)

## 2015-07-17 ENCOUNTER — Ambulatory Visit (HOSPITAL_BASED_OUTPATIENT_CLINIC_OR_DEPARTMENT_OTHER): Payer: PPO

## 2015-07-17 VITALS — BP 139/90 | HR 96 | Temp 98.0°F | Resp 20

## 2015-07-17 DIAGNOSIS — Z452 Encounter for adjustment and management of vascular access device: Secondary | ICD-10-CM

## 2015-07-17 DIAGNOSIS — C01 Malignant neoplasm of base of tongue: Secondary | ICD-10-CM | POA: Diagnosis not present

## 2015-07-17 MED ORDER — HEPARIN SOD (PORK) LOCK FLUSH 100 UNIT/ML IV SOLN
500.0000 [IU] | Freq: Once | INTRAVENOUS | Status: AC | PRN
  Administered 2015-07-17: 500 [IU]
  Filled 2015-07-17: qty 5

## 2015-07-17 MED ORDER — SODIUM CHLORIDE 0.9 % IJ SOLN
10.0000 mL | INTRAMUSCULAR | Status: DC | PRN
  Administered 2015-07-17: 10 mL
  Filled 2015-07-17: qty 10

## 2015-07-20 ENCOUNTER — Other Ambulatory Visit (HOSPITAL_BASED_OUTPATIENT_CLINIC_OR_DEPARTMENT_OTHER): Payer: PPO

## 2015-07-20 ENCOUNTER — Ambulatory Visit (HOSPITAL_BASED_OUTPATIENT_CLINIC_OR_DEPARTMENT_OTHER): Payer: PPO

## 2015-07-20 ENCOUNTER — Ambulatory Visit (HOSPITAL_BASED_OUTPATIENT_CLINIC_OR_DEPARTMENT_OTHER): Payer: PPO | Admitting: Hematology and Oncology

## 2015-07-20 ENCOUNTER — Encounter: Payer: Self-pay | Admitting: *Deleted

## 2015-07-20 ENCOUNTER — Encounter: Payer: Self-pay | Admitting: Hematology and Oncology

## 2015-07-20 ENCOUNTER — Other Ambulatory Visit: Payer: Self-pay | Admitting: *Deleted

## 2015-07-20 ENCOUNTER — Inpatient Hospital Stay (HOSPITAL_COMMUNITY)
Admission: AD | Admit: 2015-07-20 | Discharge: 2015-07-24 | DRG: 948 | Disposition: A | Discharge status: Home / Self Care | Payer: PPO | Source: Ambulatory Visit | Attending: Internal Medicine | Admitting: Internal Medicine

## 2015-07-20 ENCOUNTER — Ambulatory Visit
Admission: RE | Admit: 2015-07-20 | Discharge: 2015-07-20 | Disposition: A | Discharge status: Home / Self Care | Payer: PPO | Source: Ambulatory Visit | Attending: Radiation Oncology | Admitting: Radiation Oncology

## 2015-07-20 ENCOUNTER — Encounter (HOSPITAL_COMMUNITY): Payer: Self-pay

## 2015-07-20 ENCOUNTER — Telehealth: Payer: Self-pay | Admitting: *Deleted

## 2015-07-20 VITALS — BP 136/74 | HR 76 | Temp 98.7°F | Resp 16

## 2015-07-20 VITALS — BP 106/70 | HR 41 | Temp 99.5°F | Resp 16 | Ht 69.0 in

## 2015-07-20 DIAGNOSIS — G893 Neoplasm related pain (acute) (chronic): Secondary | ICD-10-CM | POA: Diagnosis not present

## 2015-07-20 DIAGNOSIS — S8264XD Nondisplaced fracture of lateral malleolus of right fibula, subsequent encounter for closed fracture with routine healing: Secondary | ICD-10-CM

## 2015-07-20 DIAGNOSIS — K1230 Oral mucositis (ulcerative), unspecified: Secondary | ICD-10-CM

## 2015-07-20 DIAGNOSIS — T148XXA Other injury of unspecified body region, initial encounter: Secondary | ICD-10-CM

## 2015-07-20 DIAGNOSIS — G43909 Migraine, unspecified, not intractable, without status migrainosus: Secondary | ICD-10-CM | POA: Diagnosis present

## 2015-07-20 DIAGNOSIS — M542 Cervicalgia: Secondary | ICD-10-CM

## 2015-07-20 DIAGNOSIS — T451X5A Adverse effect of antineoplastic and immunosuppressive drugs, initial encounter: Secondary | ICD-10-CM | POA: Diagnosis present

## 2015-07-20 DIAGNOSIS — M549 Dorsalgia, unspecified: Secondary | ICD-10-CM | POA: Diagnosis present

## 2015-07-20 DIAGNOSIS — J309 Allergic rhinitis, unspecified: Secondary | ICD-10-CM | POA: Diagnosis present

## 2015-07-20 DIAGNOSIS — C01 Malignant neoplasm of base of tongue: Secondary | ICD-10-CM

## 2015-07-20 DIAGNOSIS — R110 Nausea: Secondary | ICD-10-CM | POA: Diagnosis not present

## 2015-07-20 DIAGNOSIS — I10 Essential (primary) hypertension: Secondary | ICD-10-CM | POA: Diagnosis present

## 2015-07-20 DIAGNOSIS — R05 Cough: Secondary | ICD-10-CM | POA: Diagnosis not present

## 2015-07-20 DIAGNOSIS — C029 Malignant neoplasm of tongue, unspecified: Secondary | ICD-10-CM

## 2015-07-20 DIAGNOSIS — E860 Dehydration: Secondary | ICD-10-CM | POA: Diagnosis present

## 2015-07-20 DIAGNOSIS — Z6834 Body mass index (BMI) 34.0-34.9, adult: Secondary | ICD-10-CM

## 2015-07-20 DIAGNOSIS — Z885 Allergy status to narcotic agent status: Secondary | ICD-10-CM

## 2015-07-20 DIAGNOSIS — E46 Unspecified protein-calorie malnutrition: Secondary | ICD-10-CM | POA: Diagnosis present

## 2015-07-20 DIAGNOSIS — R059 Cough, unspecified: Secondary | ICD-10-CM

## 2015-07-20 DIAGNOSIS — R0981 Nasal congestion: Secondary | ICD-10-CM | POA: Diagnosis not present

## 2015-07-20 DIAGNOSIS — L271 Localized skin eruption due to drugs and medicaments taken internally: Secondary | ICD-10-CM | POA: Diagnosis present

## 2015-07-20 DIAGNOSIS — G629 Polyneuropathy, unspecified: Secondary | ICD-10-CM | POA: Diagnosis present

## 2015-07-20 DIAGNOSIS — D630 Anemia in neoplastic disease: Secondary | ICD-10-CM | POA: Diagnosis present

## 2015-07-20 DIAGNOSIS — G4730 Sleep apnea, unspecified: Secondary | ICD-10-CM | POA: Diagnosis present

## 2015-07-20 DIAGNOSIS — M1990 Unspecified osteoarthritis, unspecified site: Secondary | ICD-10-CM | POA: Diagnosis present

## 2015-07-20 DIAGNOSIS — K219 Gastro-esophageal reflux disease without esophagitis: Secondary | ICD-10-CM | POA: Diagnosis present

## 2015-07-20 DIAGNOSIS — Z87442 Personal history of urinary calculi: Secondary | ICD-10-CM

## 2015-07-20 DIAGNOSIS — X58XXXD Exposure to other specified factors, subsequent encounter: Secondary | ICD-10-CM | POA: Diagnosis present

## 2015-07-20 DIAGNOSIS — Z808 Family history of malignant neoplasm of other organs or systems: Secondary | ICD-10-CM

## 2015-07-20 DIAGNOSIS — Z79891 Long term (current) use of opiate analgesic: Secondary | ICD-10-CM

## 2015-07-20 DIAGNOSIS — G8929 Other chronic pain: Secondary | ICD-10-CM

## 2015-07-20 DIAGNOSIS — D701 Agranulocytosis secondary to cancer chemotherapy: Secondary | ICD-10-CM | POA: Diagnosis present

## 2015-07-20 DIAGNOSIS — Z79899 Other long term (current) drug therapy: Secondary | ICD-10-CM

## 2015-07-20 DIAGNOSIS — K5900 Constipation, unspecified: Secondary | ICD-10-CM | POA: Diagnosis present

## 2015-07-20 DIAGNOSIS — Z7982 Long term (current) use of aspirin: Secondary | ICD-10-CM

## 2015-07-20 LAB — COMPREHENSIVE METABOLIC PANEL (CC13)
ALT: 20 U/L (ref 0–55)
AST: 21 U/L (ref 5–34)
Albumin: 3.5 g/dL (ref 3.5–5.0)
Alkaline Phosphatase: 126 U/L (ref 40–150)
Anion Gap: 13 mEq/L — ABNORMAL HIGH (ref 3–11)
BILIRUBIN TOTAL: 0.57 mg/dL (ref 0.20–1.20)
BUN: 23.9 mg/dL (ref 7.0–26.0)
CHLORIDE: 98 meq/L (ref 98–109)
CO2: 27 meq/L (ref 22–29)
CREATININE: 1.3 mg/dL (ref 0.7–1.3)
Calcium: 9.7 mg/dL (ref 8.4–10.4)
EGFR: 62 mL/min/{1.73_m2} — ABNORMAL LOW (ref >90)
Glucose: 157 mg/dl — ABNORMAL HIGH (ref 70–140)
Potassium: 4.1 mEq/L (ref 3.5–5.1)
Sodium: 137 mEq/L (ref 136–145)
TOTAL PROTEIN: 7.3 g/dL (ref 6.4–8.3)

## 2015-07-20 LAB — CBC WITH DIFFERENTIAL/PLATELET
BASO%: 0.2 % (ref 0.0–2.0)
BASOS ABS: 0 10*3/uL (ref 0.0–0.1)
EOS ABS: 0 10*3/uL (ref 0.0–0.5)
EOS%: 0.4 % (ref 0.0–7.0)
HCT: 36.9 % — ABNORMAL LOW (ref 38.4–49.9)
HEMOGLOBIN: 12.4 g/dL — AB (ref 13.0–17.1)
LYMPH%: 6.2 % — AB (ref 14.0–49.0)
MCH: 29.5 pg (ref 27.2–33.4)
MCHC: 33.6 g/dL (ref 32.0–36.0)
MCV: 87.9 fL (ref 79.3–98.0)
MONO#: 0.1 10*3/uL (ref 0.1–0.9)
MONO%: 1.9 % (ref 0.0–14.0)
NEUT#: 4.7 10*3/uL (ref 1.5–6.5)
NEUT%: 91.3 % — AB (ref 39.0–75.0)
Platelets: 466 10*3/uL — ABNORMAL HIGH (ref 140–400)
RBC: 4.2 10*6/uL (ref 4.20–5.82)
RDW: 15.6 % — AB (ref 11.0–14.6)
WBC: 5.2 10*3/uL (ref 4.0–10.3)
lymph#: 0.3 10*3/uL — ABNORMAL LOW (ref 0.9–3.3)

## 2015-07-20 LAB — MAGNESIUM (CC13): MAGNESIUM: 1.3 mg/dL — AB (ref 1.5–2.5)

## 2015-07-20 MED ORDER — SODIUM CHLORIDE 0.9 % IV SOLN: INTRAVENOUS | Status: AC

## 2015-07-20 MED ORDER — ENOXAPARIN SODIUM 40 MG/0.4ML ~~LOC~~ SOLN
40.0000 mg | SUBCUTANEOUS | Status: DC
  Administered 2015-07-20 – 2015-07-23 (×4): 40 mg via SUBCUTANEOUS
  Filled 2015-07-20 (×5): qty 0.4

## 2015-07-20 MED ORDER — PANTOPRAZOLE SODIUM 40 MG IV SOLR
40.0000 mg | Freq: Two times a day (BID) | INTRAVENOUS | Status: DC
  Administered 2015-07-20 – 2015-07-22 (×4): 40 mg via INTRAVENOUS
  Filled 2015-07-20 (×5): qty 40

## 2015-07-20 MED ORDER — DIPHENHYDRAMINE HCL 25 MG PO CAPS
25.0000 mg | ORAL_CAPSULE | Freq: Four times a day (QID) | ORAL | Status: DC | PRN
Start: 2015-07-20 — End: 2015-07-24

## 2015-07-20 MED ORDER — DIPHENHYDRAMINE HCL 50 MG/ML IJ SOLN
25.0000 mg | Freq: Once | INTRAMUSCULAR | Status: AC
  Administered 2015-07-20: 25 mg via INTRAVENOUS

## 2015-07-20 MED ORDER — PROMETHAZINE HCL 25 MG/ML IJ SOLN
12.5000 mg | Freq: Four times a day (QID) | INTRAMUSCULAR | Status: DC | PRN
  Administered 2015-07-20 – 2015-07-23 (×2): 12.5 mg via INTRAVENOUS
  Filled 2015-07-20 (×2): qty 1

## 2015-07-20 MED ORDER — MAGNESIUM SULFATE 2 GM/50ML IV SOLN
2.0000 g | Freq: Once | INTRAVENOUS | Status: AC
  Administered 2015-07-20: 2 g via INTRAVENOUS
  Filled 2015-07-20: qty 50

## 2015-07-20 MED ORDER — MODAFINIL 200 MG PO TABS
200.0000 mg | ORAL_TABLET | Freq: Every day | ORAL | Status: DC
  Administered 2015-07-21 – 2015-07-24 (×4): 200 mg via ORAL
  Filled 2015-07-20 (×4): qty 1

## 2015-07-20 MED ORDER — GABAPENTIN 300 MG PO CAPS
600.0000 mg | ORAL_CAPSULE | ORAL | Status: DC
  Administered 2015-07-20 – 2015-07-23 (×7): 600 mg via ORAL
  Filled 2015-07-20 (×12): qty 2

## 2015-07-20 MED ORDER — LIDOCAINE VISCOUS 2 % MT SOLN
20.0000 mL | Freq: Four times a day (QID) | OROMUCOSAL | Status: DC | PRN
  Filled 2015-07-20: qty 20

## 2015-07-20 MED ORDER — LIDOCAINE 5 % EX PTCH
1.0000 | MEDICATED_PATCH | Freq: Every day | CUTANEOUS | Status: DC | PRN
  Filled 2015-07-20: qty 1

## 2015-07-20 MED ORDER — HYDROCERIN EX CREA
TOPICAL_CREAM | Freq: Three times a day (TID) | CUTANEOUS | Status: DC | PRN
  Filled 2015-07-20: qty 113

## 2015-07-20 MED ORDER — FAMOTIDINE IN NACL 20-0.9 MG/50ML-% IV SOLN
20.0000 mg | Freq: Two times a day (BID) | INTRAVENOUS | Status: DC
  Administered 2015-07-20: 20 mg via INTRAVENOUS

## 2015-07-20 MED ORDER — ASPIRIN EC 81 MG PO TBEC
81.0000 mg | DELAYED_RELEASE_TABLET | Freq: Every day | ORAL | Status: DC
  Administered 2015-07-20 – 2015-07-24 (×5): 81 mg via ORAL
  Filled 2015-07-20 (×5): qty 1

## 2015-07-20 MED ORDER — HYDROMORPHONE HCL 4 MG/ML IJ SOLN
INTRAMUSCULAR | Status: AC
  Filled 2015-07-20: qty 1

## 2015-07-20 MED ORDER — HYDROCORTISONE 0.5 % EX CREA
TOPICAL_CREAM | Freq: Three times a day (TID) | CUTANEOUS | Status: DC
  Administered 2015-07-20 – 2015-07-24 (×11): via TOPICAL
  Filled 2015-07-20: qty 28.35

## 2015-07-20 MED ORDER — HYDROMORPHONE HCL 1 MG/ML IJ SOLN
2.0000 mg | INTRAMUSCULAR | Status: DC | PRN
  Administered 2015-07-20 (×2): 2 mg via INTRAVENOUS
  Filled 2015-07-20: qty 2

## 2015-07-20 MED ORDER — POLYETHYLENE GLYCOL 3350 17 G PO PACK
17.0000 g | PACK | Freq: Two times a day (BID) | ORAL | Status: DC
  Administered 2015-07-20 – 2015-07-24 (×8): 17 g via ORAL
  Filled 2015-07-20 (×9): qty 1

## 2015-07-20 MED ORDER — HYDROMORPHONE HCL 4 MG/ML IJ SOLN
INTRAMUSCULAR | Status: AC
Start: 2015-07-20 — End: 2015-07-20
  Filled 2015-07-20: qty 1

## 2015-07-20 MED ORDER — ONDANSETRON HCL 4 MG/2ML IJ SOLN
4.0000 mg | Freq: Three times a day (TID) | INTRAMUSCULAR | Status: DC | PRN
  Administered 2015-07-21 – 2015-07-24 (×8): 4 mg via INTRAVENOUS
  Filled 2015-07-20 (×8): qty 2

## 2015-07-20 MED ORDER — SODIUM CHLORIDE 0.9 % IV SOLN
INTRAVENOUS | Status: DC
  Administered 2015-07-20: 14:00:00 via INTRAVENOUS

## 2015-07-20 MED ORDER — SODIUM CHLORIDE 0.9 % IV SOLN
Freq: Once | INTRAVENOUS | Status: AC
  Administered 2015-07-20: 12:00:00 via INTRAVENOUS

## 2015-07-20 MED ORDER — DIPHENHYDRAMINE HCL 50 MG/ML IJ SOLN
INTRAMUSCULAR | Status: AC
  Filled 2015-07-20: qty 1

## 2015-07-20 MED ORDER — FAMOTIDINE 20 MG PO TABS
20.0000 mg | ORAL_TABLET | Freq: Two times a day (BID) | ORAL | Status: DC
  Administered 2015-07-20 – 2015-07-24 (×8): 20 mg via ORAL
  Filled 2015-07-20 (×9): qty 1

## 2015-07-20 MED ORDER — TRAZODONE HCL 100 MG PO TABS
100.0000 mg | ORAL_TABLET | Freq: Every day | ORAL | Status: DC
  Administered 2015-07-20 – 2015-07-23 (×4): 100 mg via ORAL
  Filled 2015-07-20 (×5): qty 1

## 2015-07-20 MED ORDER — FAMOTIDINE IN NACL 20-0.9 MG/50ML-% IV SOLN
INTRAVENOUS | Status: AC
  Filled 2015-07-20: qty 50

## 2015-07-20 MED ORDER — EMOLLIENT BASE EX CREA: 1.0000 "application " | TOPICAL_CREAM | Freq: Three times a day (TID) | CUTANEOUS | Status: DC | PRN

## 2015-07-20 MED ORDER — SODIUM CHLORIDE 0.9 % IV SOLN
Freq: Once | INTRAVENOUS | Status: AC
  Administered 2015-07-20: 12:00:00 via INTRAVENOUS
  Filled 2015-07-20: qty 4

## 2015-07-20 MED ORDER — ARMODAFINIL 250 MG PO TABS: 250.0000 mg | ORAL_TABLET | Freq: Every day | ORAL | Status: DC

## 2015-07-20 MED ORDER — HYDROMORPHONE HCL 2 MG/ML IJ SOLN
2.0000 mg | INTRAMUSCULAR | Status: DC | PRN
  Administered 2015-07-20 – 2015-07-23 (×12): 2 mg via INTRAVENOUS
  Filled 2015-07-20 (×13): qty 1

## 2015-07-20 MED ORDER — POLYETHYLENE GLYCOL 3350 17 GM/SCOOP PO POWD: 17.0000 g | Freq: Two times a day (BID) | ORAL | Status: DC

## 2015-07-20 MED ORDER — METHADONE HCL 10 MG PO TABS
20.0000 mg | ORAL_TABLET | Freq: Three times a day (TID) | ORAL | Status: DC
  Administered 2015-07-20 – 2015-07-24 (×11): 20 mg via ORAL
  Filled 2015-07-20 (×11): qty 2

## 2015-07-20 NOTE — Progress Notes (Signed)
Received patient from [name] clinic @11 :45 am. Pt is to be admitted to hospital when bed available for dehydration, pain control and uncontrolled nausea.  Port is accessed and .9NS infusing @ 500 cc/hr. He has received Dilaudid 2 mg IVP with fair pain relief. Rates his pain at a 5. Not c/o nausea at this time.  C/o back itching. Has rash on his back-small red, raised areas-from his chemo. Informed Selena Lesser, NP about pt's condition. Received ordwers for IV Benadryl and IV pepcid.  Told pt he would be getting these meds and he voiced understanding. Pt sleepy from dilaudid though restless at the same time. Side rails up on bed x 4  @ 1247 Remains restless. Vital signs taken S02 @ 88. Started on 2l/min 02 per [place]. Sats improved to 96-97. Raised HOB up to 45 degrees. Daughter, Earnest Bailey at bedside.  Selena Lesser, NP aware of pt condition. Awaiting bed assignment.  Continued to monitor patient. Received IV dilaudid-see MAR. 02 Sats improving 97% on room air. Keeping 02 prn for now.  @ approx 1430 room on 3W ready and pt transported via w/c to 1343. Report given to Josph Macho, [place]

## 2015-07-20 NOTE — Telephone Encounter (Signed)
  Oncology Nurse Navigator Documentation   Navigator Encounter Type: Telephone (07/20/15 1521)     Spoke with patient's dtr Amy who stated she had just been informed by her sister that their dad has been admitted to [facility].  I provided clarification of room location, noted I would see them later this afternoon.  She expressed appreciation.  Gayleen Orem, RN, BSN, [place] at [facility] [phone]

## 2015-07-20 NOTE — Assessment & Plan Note (Signed)
He has chronic back pain and recent mucositis pain. As mentioned above, we will get him admitted for IV pain medicine for better pain control

## 2015-07-20 NOTE — Patient Instructions (Signed)
Dehydration, Adult Dehydration is when you lose more fluids from the body than you take in. Vital organs like the kidneys, brain, and heart cannot function without a proper amount of fluids and salt. Any loss of fluids from the body can cause dehydration.  CAUSES   Vomiting.  Diarrhea.  Excessive sweating.  Excessive urine output.  Fever. SYMPTOMS  Mild dehydration  Thirst.  Dry lips.  Slightly dry mouth. Moderate dehydration  Very dry mouth.  Sunken eyes.  Skin does not bounce back quickly when lightly pinched and released.  Dark urine and decreased urine production.  Decreased tear production.  Headache. Severe dehydration  Very dry mouth.  Extreme thirst.  Rapid, weak pulse (more than 100 beats per minute at rest).  Cold hands and feet.  Not able to sweat in spite of heat and temperature.  Rapid breathing.  Blue lips.  Confusion and lethargy.  Difficulty being awakened.  Minimal urine production.  No tears. DIAGNOSIS  Your caregiver will diagnose dehydration based on your symptoms and your exam. Blood and urine tests will help confirm the diagnosis. The diagnostic evaluation should also identify the cause of dehydration. TREATMENT  Treatment of mild or moderate dehydration can often be done at home by increasing the amount of fluids that you drink. It is best to drink small amounts of fluid more often. Drinking too much at one time can make vomiting worse. Refer to the home care instructions below. Severe dehydration needs to be treated at the hospital where you will probably be given intravenous (IV) fluids that contain water and electrolytes. HOME CARE INSTRUCTIONS   Ask your caregiver about specific rehydration instructions.  Drink enough fluids to keep your urine clear or pale yellow.  Drink small amounts frequently if you have nausea and vomiting.  Eat as you normally do.  Avoid:  Foods or drinks high in sugar.  Carbonated  drinks.  Juice.  Extremely hot or cold fluids.  Drinks with caffeine.  Fatty, greasy foods.  Alcohol.  Tobacco.  Overeating.  Gelatin desserts.  Wash your hands well to avoid spreading bacteria and viruses.  Only take over-the-counter or prescription medicines for pain, discomfort, or fever as directed by your caregiver.  Ask your caregiver if you should continue all prescribed and over-the-counter medicines.  Keep all follow-up appointments with your caregiver. SEEK MEDICAL CARE IF:  You have abdominal pain and it increases or stays in one area (localizes).  You have a rash, stiff neck, or severe headache.  You are irritable, sleepy, or difficult to awaken.  You are weak, dizzy, or extremely thirsty. SEEK IMMEDIATE MEDICAL CARE IF:   You are unable to keep fluids down or you get worse despite treatment.  You have frequent episodes of vomiting or diarrhea.  You have blood or green matter (bile) in your vomit.  You have blood in your stool or your stool looks black and tarry.  You have not urinated in 6 to 8 hours, or you have only urinated a small amount of very dark urine.  You have a fever.  You faint. MAKE SURE YOU:   Understand these instructions.  Will watch your condition.  Will get help right away if you are not doing well or get worse. Document Released: 11/17/2005 Document Revised: 02/09/2012 Document Reviewed: 07/07/2011 ExitCare Patient Information 2015 [employer]. This information is not intended to replace advice given to you by your health care provider. Make sure you discuss any questions you have with your health care   provider.  

## 2015-07-20 NOTE — Assessment & Plan Note (Signed)
He has low magnesium likely related to recent side effects of treatment. He would benefit with IV fluid and IV magnesium replacement.

## 2015-07-20 NOTE — Assessment & Plan Note (Signed)
He appears dehydrated from uncontrolled nausea. As mentioned above, we will get him admitted for IV anti-emetics and IV fluids

## 2015-07-20 NOTE — Progress Notes (Signed)
Patient to be admitted to room 1343 today.

## 2015-07-20 NOTE — Progress Notes (Signed)
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   04/13/2015 Initial Biopsy Accession [id number]:  Lymph node, needle/core biopsy - SCC, p16 positive.   04/18/2015 Initial Diagnosis Tongue cancer   04/24/2015 Imaging PET CT showed tongue cancer, lung nodule and possible bone mets   04/26/2015 Surgery He had dental extractions   05/03/2015 Imaging CT neck with contrast: L base of tongue with regional adenopathy, metastatic disease C2, C3, T2 vertebra; posterior L fourth rib.   05/09/2015 Procedure Port-a-cath placed.   05/11/2015 - 05/23/2015 Radiation Therapy He received palliative radiation therapy   06/01/2015 -  Chemotherapy He started cycle 1 of carboplatin, 5-FU and cetuximab   06/04/2015 - 06/05/2015 Hospital Admission He was admitted to the hospital after syncopal episode and had right nondisplaced fibular fracture    INTERVAL HISTORY: Please see below for problem oriented charting. He returns today for further follow-up and for treatment today. Throughout the week, he was not able to eat, drink, take his medications by mouth and had been feeling miserable. He had uncontrollable pain in his throat from mucositis. He feels very weak.  REVIEW OF SYSTEMS:   Constitutional: Denies fevers, chills or abnormal weight loss Eyes: Denies blurriness of vision Respiratory: Denies cough, dyspnea or wheezes Cardiovascular: Denies palpitation, chest discomfort or lower extremity swelling Skin: Denies abnormal skin rashes Lymphatics: Denies  new lymphadenopathy or easy bruising Neurological:Denies numbness, tingling  Behavioral/Psych: Mood is stable, no new changes  All other systems were reviewed with the patient and are negative.  I have reviewed the past medical history, past surgical history, social history and family history with the patient and they are unchanged from previous note.  ALLERGIES:  is allergic to morphine and related.  MEDICATIONS:  Current Outpatient Prescriptions  Medication Sig Dispense Refill  . amLODipine (NORVASC) 5 MG tablet Take 5 mg by mouth daily.     . Armodafinil 250 MG tablet Take 250 mg by mouth daily.    Marland Kitchen aspirin EC 81 MG tablet Take 81 mg by mouth daily.    . clindamycin (CLINDAGEL) 1 % gel APPLY TO AFFECTED AREA(S) TWICE DAILY 60 g 0  . dexamethasone (DECADRON) 4 MG tablet Take 1 tablet (4 mg total) by mouth 2 (two) times daily. 60 tablet 0  . diphenhydrAMINE (BENADRYL) 25 mg capsule Take 25 mg by mouth every 6 (six) hours as needed for itching.    Marland Kitchen emollient (BIAFINE) cream Apply 1 application topically 3 (three) times daily as needed (pain).     . famotidine (PEPCID) 20 MG tablet Take 20 mg by mouth 2 (two) times daily.    . fentaNYL (DURAGESIC - DOSED MCG/HR) 25 MCG/HR patch Place 1 patch (25 mcg total) onto the skin every 3 (three) days. 5 patch 0  . gabapentin (NEURONTIN) 100 MG capsule Take 600 mg by mouth 2 (two) times daily. Takes with supper and at bedtime    . hydrocortisone cream 0.5 % Apply topically 3 (three) times daily. 30 g 0  . HYDROmorphone (DILAUDID) 4 MG tablet Take 1 tablet (4  mg total) by mouth every 4 (four) hours as needed for severe pain. 60 tablet 0  . ibuprofen (ADVIL,MOTRIN) 200 MG tablet Take 400 mg by mouth every 6 (six) hours as needed for mild pain or moderate pain.    Marland Kitchen lansoprazole (PREVACID) 30 MG capsule Take 30 mg by mouth daily at 12 noon.    . lidocaine (LIDODERM) 5 % Place 1 patch onto the skin daily. Remove & Discard patch within 12 hours or as  directed by MD (Patient taking differently: Place 1 patch onto the skin daily as needed (pain). Remove & Discard patch within 12 hours or as directed by MD) 30 patch 0  . lidocaine (XYLOCAINE) 2 % solution Patient: Mix 1part 2% viscous lidocaine, 1part H20. Swish and/or swallow 58mL of this mixture, 50min before meals and at bedtime, up to QID (Patient taking differently: Use as directed 20 mLs in the mouth or throat every 6 (six) hours as needed. Patient: Mix 1part 2% viscous lidocaine, 1part H20. Swish and/or swallow 37mL of this mixture, 25min before meals and at bedtime, up to QID) 100 mL 4  . lidocaine-prilocaine (EMLA) cream Apply 1 application topically as needed. To Port a cath site one hour prior to needle stick 30 g 3  . methadone (DOLOPHINE) 10 MG tablet Take 2 tablets (20 mg total) by mouth every 8 (eight) hours. 90 tablet 0  . Misc. Devices (PUMP IN STYLE ADVANCED) MISC Inject 1 cartridge into the vein See admin instructions. Adrucil 9350 mg  2.51ml/hr. Runs for 96 hours    . ondansetron (ZOFRAN) 8 MG tablet Take 1 tablet (8 mg total) by mouth every 8 (eight) hours as needed for nausea. 30 tablet 3  . oxymetazoline (AFRIN) 0.05 % nasal spray Place 2 sprays into both nostrils daily as needed for congestion.    . polyethylene glycol powder (GLYCOLAX/MIRALAX) powder Take 17 g by mouth 2 (two) times daily. 255 g 0  . promethazine (PHENERGAN) 25 MG tablet Take 1 tablet (25 mg total) by mouth every 6 (six) hours as needed for nausea. 30 tablet 3  . scopolamine (TRANSDERM-SCOP) 1 MG/3DAYS Place 1 patch (1.5 mg total) onto the skin every 3 (three) days. 10 patch 12  . sucralfate (CARAFATE) 1 G tablet Dissolve 1 tablet in 10 mL H20 and swallow up to QID as needed for sore throat (Patient taking differently: Take 1 g by mouth 4 (four) times daily as needed (sore throat). Dissolve 1 tablet in 10 mL H20 and swallow up to QID as needed for sore throat) 40 tablet 3  . traZODone (DESYREL) 100 MG tablet Take  100 mg by mouth at bedtime.     No current facility-administered medications for this visit.    PHYSICAL EXAMINATION: ECOG PERFORMANCE STATUS: 1 - Symptomatic but completely ambulatory  Filed Vitals:   07/20/15 1015  BP: 106/70  Pulse: 41  Temp: 99.5 F (37.5 C)  Resp: 16   Filed Weights    GENERAL:alert, no distress and comfortable. He looks sick, holding on the bucket and about to throw up SKIN: skin color, texture, turgor are normal, no rashes or significant lesions EYES: normal, Conjunctiva are pink and non-injected, sclera clear OROPHARYNX: Mucositis. No thrush. Dry mucous membrane is noted NECK: supple, thyroid normal size, non-tender, without nodularity LYMPH:  no palpable lymphadenopathy in the cervical, axillary or inguinal LUNGS: clear to auscultation and percussion with normal breathing effort HEART: regular rate & rhythm and no murmurs and no lower extremity edema ABDOMEN:abdomen  soft, non-tender and normal bowel sounds Musculoskeletal:no cyanosis of digits and no clubbing  NEURO: alert & oriented x 3 with fluent speech, no focal motor/sensory deficits  LABORATORY DATA:  I have reviewed the data as listed    Component Value Date/Time   NA 137 07/20/2015 0939   NA 138 06/05/2015 0409   K 4.1 07/20/2015 0939   K 4.5 06/05/2015 0409   CL 101 06/05/2015 0409   CO2 27 07/20/2015 0939   CO2 30 06/05/2015 0409   GLUCOSE 157* 07/20/2015 0939   GLUCOSE 194* 06/05/2015 0409   BUN 23.9 07/20/2015 0939   BUN 22* 06/05/2015 0409   CREATININE 1.3 07/20/2015 0939   CREATININE 1.20 06/05/2015 0409   CALCIUM 9.7 07/20/2015 0939   CALCIUM 8.8* 06/05/2015 0409   PROT 7.3 07/20/2015 0939   PROT 7.9 11/20/2014 1115   ALBUMIN 3.5 07/20/2015 0939   ALBUMIN 4.1 11/20/2014 1115   AST 21 07/20/2015 0939   AST 22 11/20/2014 1115   ALT 20 07/20/2015 0939   ALT 22 11/20/2014 1115   ALKPHOS 126 07/20/2015 0939   ALKPHOS 117 11/20/2014 1115   BILITOT 0.57 07/20/2015 0939    BILITOT 0.4 11/20/2014 1115   GFRNONAA >60 06/05/2015 0409   GFRAA >60 06/05/2015 0409    No results found for: SPEP, UPEP  Lab Results  Component Value Date   WBC 5.2 07/20/2015   NEUTROABS 4.7 07/20/2015   HGB 12.4* 07/20/2015   HCT 36.9* 07/20/2015   MCV 87.9 07/20/2015   PLT 466* 07/20/2015      Chemistry      Component Value Date/Time   NA 137 07/20/2015 0939   NA 138 06/05/2015 0409   K 4.1 07/20/2015 0939   K 4.5 06/05/2015 0409   CL 101 06/05/2015 0409   CO2 27 07/20/2015 0939   CO2 30 06/05/2015 0409   BUN 23.9 07/20/2015 0939   BUN 22* 06/05/2015 0409   CREATININE 1.3 07/20/2015 0939   CREATININE 1.20 06/05/2015 0409      Component Value Date/Time   CALCIUM 9.7 07/20/2015 0939   CALCIUM 8.8* 06/05/2015 0409   ALKPHOS 126 07/20/2015 0939   ALKPHOS 117 11/20/2014 1115   AST 21 07/20/2015 0939   AST 22 11/20/2014 1115   ALT 20 07/20/2015 0939   ALT 22 11/20/2014 1115   BILITOT 0.57 07/20/2015 0939   BILITOT 0.4 11/20/2014 1115     ASSESSMENT & PLAN:  Tongue cancer The patient is miserable with uncontrollable nausea over the last few days. He was not able to have any oral intake at all. His pain is poorly controlled. He was not able to take oral breakthrough pain medicine. I recommend admission to the hospital for IV fluids, IV anti-emetics and IV pain medicine. He agreed with the plan of care. I have spoken with the hospitalist who have kindly accepted the patient admission for management as above I will hold all treatment today and would defer chemotherapy to the future after he recovers from recent side effects of treatment  Chronic neck pain He has chronic back pain and recent mucositis pain. As mentioned above, we will get him admitted for IV pain medicine for better pain control  Chemotherapy-induced nausea He appears dehydrated from uncontrolled nausea. As mentioned above, we will get him admitted for IV anti-emetics and IV  fluids  Hypomagnesemia He has low magnesium likely related to recent side effects of treatment. He would benefit with IV fluid and IV magnesium replacement.  No orders of the defined types were placed in this encounter.   All questions were answered. The patient knows to call the clinic with any problems, questions or concerns. No barriers to learning was detected. I spent 25 minutes counseling the patient face to face. The total time spent in the appointment was 40 minutes and more than 50% was on counseling and review of test results  While waiting for admission, we will start him on IV fluids, IV anti-emetics and IV pain medications.    [place], [place], MD 07/20/2015 10:38 AM

## 2015-07-20 NOTE — Assessment & Plan Note (Addendum)
The patient is miserable with uncontrollable nausea over the last few days. He was not able to have any oral intake at all. His pain is poorly controlled. He was not able to take oral breakthrough pain medicine. I recommend admission to the hospital for IV fluids, IV anti-emetics and IV pain medicine. He agreed with the plan of care. I have spoken with the hospitalist who have kindly accepted the patient admission for management as above I will hold all treatment today and would defer chemotherapy to the future after he recovers from recent side effects of treatment

## 2015-07-20 NOTE — H&P (Signed)
History and Physical  Shane Cantu JEH:[id number] DOB: [birth year] DOA: 07/20/2015  Referring physician: EDP PCP: [name], MD   Chief Complaint:  Intractable cancer pain, n/v, poor oral intake, dehydration  HPI: Shane Cantu is a 59 y.o. male   With h/o stage iv headneck cancer is sent from oncology clinic for direct admission due to above complaints,  He came to get weekly chemotherapy, found to have dehydration, significant hypomagnesemia, severe cancer pain, his scheduled chemotherapy today held , oncology requested hospitalist to admit the patient with oncology following patient while he is in the hospital.  Patient denies dysphagia, no cough, no fever, intermittent constipation due to pain meds and diarrhea after chemo. No edema, has some rash on chest and upper back due to cancer drugs.  Review of Systems:  Detail per HPI, Review of systems are otherwise negative  Past Medical History  Diagnosis Date  . Chronic pain   . Back pain   . Chronic neck pain   . History of kidney stones   . Allergic rhinitis   . Hypertension   . Migraine headache without aura   . Sleep apnea     does not use cpap  . GERD (gastroesophageal reflux disease)   . Neuropathy   . Arthritis   . Cancer      may 2016 tongue cancer   Past Surgical History  Procedure Laterality Date  . Back surgery  03/1993, 10/2002    Dr. Louanne Skye  . Tonsillectomy      as a child  . Neck surgery  2004    Dr. Louanne Skye  . Lymph node biopsy    . Nasal sinusotomy  1977  . Colonoscopy    . Multiple extractions with alveoloplasty N/A 04/26/2015    Procedure: Extraction of tooth #'s 6,11,12,15,20,21,22,27,28 with alveoloplasty;  Surgeon: [name], DDS;  Location: [facility];  Service: Oral Surgery;  Laterality: N/A;   Social History:  reports that he has never smoked. He has never used smokeless tobacco. He reports that he drinks alcohol. He reports that he does not use illicit drugs. Patient lives at home & is able  to participate in activities of daily living independently   Allergies  Allergen Reactions  . Morphine And Related Anaphylaxis    Family History  Problem Relation Age of Onset  . Cancer Father     throat ca  . Cancer Brother     pituitary ca  . Heart disease Mother       Prior to Admission medications   Medication Sig Start Date End Date Taking? Authorizing Provider  amLODipine (NORVASC) 5 MG tablet Take 5 mg by mouth daily.    Yes Historical Provider, MD  Armodafinil 250 MG tablet Take 250 mg by mouth daily.   Yes Historical Provider, MD  aspirin EC 81 MG tablet Take 81 mg by mouth daily.   Yes Historical Provider, MD  diphenhydrAMINE (BENADRYL) 25 mg capsule Take 25 mg by mouth every 6 (six) hours as needed for itching.   Yes Historical Provider, MD  emollient (BIAFINE) cream Apply 1 application topically 3 (three) times daily as needed (pain).    Yes Historical Provider, MD  famotidine (PEPCID) 20 MG tablet Take 20 mg by mouth 2 (two) times daily.   Yes Historical Provider, MD  gabapentin (NEURONTIN) 100 MG capsule Take 600 mg by mouth 2 (two) times daily. Takes with supper and at bedtime   Yes Historical Provider, MD  hydrocortisone cream 0.5 %  Apply topically 3 (three) times daily. 06/15/15  Yes [name], MD  HYDROmorphone (DILAUDID) 4 MG tablet Take 1 tablet (4 mg total) by mouth every 4 (four) hours as needed for severe pain. 06/29/15  Yes [name], MD  ibuprofen (ADVIL,MOTRIN) 200 MG tablet Take 600 mg by mouth every 6 (six) hours as needed for mild pain or moderate pain.    Yes Historical Provider, MD  lansoprazole (PREVACID) 30 MG capsule Take 30 mg by mouth daily at 12 noon.   Yes Historical Provider, MD  lidocaine (LIDODERM) 5 % Place 1 patch onto the skin daily. Remove & Discard patch within 12 hours or as directed by MD Patient taking differently: Place 1 patch onto the skin daily as needed (pain). Remove & Discard patch within 12 hours or as directed by MD 05/16/15  Yes  [name], MD  lidocaine (XYLOCAINE) 2 % solution Patient: Mix 1part 2% viscous lidocaine, 1part H20. Swish and/or swallow 27mL of this mixture, 53min before meals and at bedtime, up to QID Patient taking differently: Use as directed 20 mLs in the mouth or throat every 6 (six) hours as needed. Patient: Mix 1part 2% viscous lidocaine, 1part H20. Swish and/or swallow 61mL of this mixture, 13min before meals and at bedtime, up to QID 05/14/15  Yes Eppie Gibson, MD  lidocaine-prilocaine (EMLA) cream Apply 1 application topically as needed. To Port a cath site one hour prior to needle stick 05/09/15  Yes [name], MD  methadone (DOLOPHINE) 10 MG tablet Take 2 tablets (20 mg total) by mouth every 8 (eight) hours. 07/13/15  Yes [name], MD  Misc. Devices (PUMP IN STYLE ADVANCED) MISC Inject 1 cartridge into the vein See admin instructions. Adrucil 9350 mg  2.66ml/hr. Runs for 96 hours   Yes Historical Provider, MD  ondansetron (ZOFRAN) 8 MG tablet Take 1 tablet (8 mg total) by mouth every 8 (eight) hours as needed for nausea. 05/31/15  Yes [name], MD  polyethylene glycol powder (GLYCOLAX/MIRALAX) powder Take 17 g by mouth 2 (two) times daily. 04/06/13  Yes Veryl Speak, MD  promethazine (PHENERGAN) 25 MG tablet Take 1 tablet (25 mg total) by mouth every 6 (six) hours as needed for nausea. 05/31/15  Yes [name], MD  scopolamine (TRANSDERM-SCOP) 1 MG/3DAYS Place 1 patch (1.5 mg total) onto the skin every 3 (three) days. 06/29/15  Yes [name], MD  sucralfate (CARAFATE) 1 G tablet Dissolve 1 tablet in 10 mL H20 and swallow up to QID as needed for sore throat Patient taking differently: Take 1 g by mouth 4 (four) times daily as needed (sore throat). Dissolve 1 tablet in 10 mL H20 and swallow up to QID as needed for sore throat 05/14/15  Yes Eppie Gibson, MD  traZODone (DESYREL) 100 MG tablet Take 100 mg by mouth at bedtime.   Yes Historical Provider, MD  clindamycin (CLINDAGEL) 1 % gel APPLY TO AFFECTED  AREA(S) TWICE DAILY Patient not taking: Reported on 07/20/2015 06/25/15   [name], MD  dexamethasone (DECADRON) 4 MG tablet Take 1 tablet (4 mg total) by mouth 2 (two) times daily. Patient not taking: Reported on 07/20/2015 05/21/15   [name], MD  fentaNYL (DURAGESIC - DOSED MCG/HR) 25 MCG/HR patch Place 1 patch (25 mcg total) onto the skin every 3 (three) days. Patient not taking: Reported on 07/20/2015 06/29/15   [name], MD    Physical Exam: BP 116/70 mmHg  Pulse 76  Temp(Src) 98.3 F (36.8 C) (Oral)  Resp 18  Ht 5\' 9"  (1.753 m)  Wt 235 lb (106.595 kg)  BMI 34.69 kg/m2  SpO2 99%  General:  NAD Eyes: PERRL ENT: unremarkable Neck: supple, no JVD Cardiovascular: RRR Respiratory: CTABL Abdomen: soft/ND/ND, positive bowel sounds Skin: mild scattered miliary rash chest and upper back Musculoskeletal:  No edema Psychiatric: calm/cooperative Neurologic: no focal findings            Labs on Admission:  Basic Metabolic Panel:  Recent Labs Lab 07/20/15 0939  NA 137  K 4.1  CO2 27  GLUCOSE 157*  BUN 23.9  CREATININE 1.3  CALCIUM 9.7  MG 1.3*   Liver Function Tests:  Recent Labs Lab 07/20/15 0939  AST 21  ALT 20  ALKPHOS 126  BILITOT 0.57  PROT 7.3  ALBUMIN 3.5   No results for input(s): LIPASE, AMYLASE in the last 168 hours. No results for input(s): AMMONIA in the last 168 hours. CBC:  Recent Labs Lab 07/20/15 0939  WBC 5.2  NEUTROABS 4.7  HGB 12.4*  HCT 36.9*  MCV 87.9  PLT 466*   Cardiac Enzymes: No results for input(s): CKTOTAL, CKMB, CKMBINDEX, TROPONINI in the last 168 hours.  BNP (last 3 results) No results for input(s): BNP in the last 8760 hours.  ProBNP (last 3 results) No results for input(s): PROBNP in the last 8760 hours.  CBG: No results for input(s): GLUCAP in the last 168 hours.  Radiological Exams on Admission: No results found.    Assessment/Plan Present on Admission:  **None**   Intractable cancer pain: iv  dilaudid, stool softener  N/v/dehydration: ivf/prn antiemetics  Hypomagnesemia: iv mag replacement  Stage IV tongue cancer :defer to oncology  HTN: hold norvasc due to dehydation  DVT prophylaxis: lovenox  Consultants: oncology  Code Status: full   Family Communication:  Patient and family members in room  Disposition Plan: admit to med surg  Time spent: 70mins  Eriana Suliman MD, PhD [facility] Pager [phone] If 7PM-7AM, please contact night-coverage at [email or web address], password [facility]

## 2015-07-20 NOTE — Progress Notes (Signed)
  Oncology Nurse Navigator Documentation   Navigator Encounter Type: Clinic/MDC (07/20/15 1330)     Visited patient in Infusion where he was receiving IVF prior to his admission to [facility].  His dtr Earnest Bailey was with him. He had recently received pain medication and was resting, though he did acknowledge my presence. I indicated I would follow-up with him later this afternoon after he is admitted.  Gayleen Orem, RN, BSN, [place] [facility] [place] [phone]

## 2015-07-21 DIAGNOSIS — E860 Dehydration: Secondary | ICD-10-CM | POA: Diagnosis not present

## 2015-07-21 DIAGNOSIS — G893 Neoplasm related pain (acute) (chronic): Secondary | ICD-10-CM | POA: Diagnosis not present

## 2015-07-21 LAB — BASIC METABOLIC PANEL
Anion gap: 6 (ref 5–15)
BUN: 20 mg/dL (ref 6–20)
CALCIUM: 8.6 mg/dL — AB (ref 8.9–10.3)
CO2: 30 mmol/L (ref 22–32)
CREATININE: 1.02 mg/dL (ref 0.61–1.24)
Chloride: 100 mmol/L — ABNORMAL LOW (ref 101–111)
GFR calc non Af Amer: >60 mL/min (ref >60)
Glucose, Bld: 99 mg/dL (ref 65–99)
Potassium: 3.5 mmol/L (ref 3.5–5.1)
SODIUM: 136 mmol/L (ref 135–145)

## 2015-07-21 LAB — CBC
HEMATOCRIT: 29.3 % — AB (ref 39.0–52.0)
Hemoglobin: 9.3 g/dL — ABNORMAL LOW (ref 13.0–17.0)
MCH: 28.2 pg (ref 26.0–34.0)
MCHC: 31.7 g/dL (ref 30.0–36.0)
MCV: 88.8 fL (ref 78.0–100.0)
Platelets: 362 10*3/uL (ref 150–400)
RBC: 3.3 MIL/uL — ABNORMAL LOW (ref 4.22–5.81)
RDW: 15.5 % (ref 11.5–15.5)
WBC: 2.8 10*3/uL — ABNORMAL LOW (ref 4.0–10.5)

## 2015-07-21 LAB — TSH: TSH: 2.035 u[IU]/mL (ref 0.350–4.500)

## 2015-07-21 LAB — MAGNESIUM: Magnesium: 1.6 mg/dL — ABNORMAL LOW (ref 1.7–2.4)

## 2015-07-21 MED ORDER — POTASSIUM CHLORIDE CRYS ER 20 MEQ PO TBCR
40.0000 meq | EXTENDED_RELEASE_TABLET | Freq: Once | ORAL | Status: AC
  Administered 2015-07-21: 40 meq via ORAL
  Filled 2015-07-21: qty 2

## 2015-07-21 MED ORDER — LIDOCAINE VISCOUS 2 % MT SOLN
15.0000 mL | Freq: Four times a day (QID) | OROMUCOSAL | Status: DC
Start: 2015-07-21 — End: 2015-07-24
  Administered 2015-07-21 – 2015-07-24 (×10): 15 mL via OROMUCOSAL
  Filled 2015-07-21 (×16): qty 15

## 2015-07-21 MED ORDER — SENNOSIDES-DOCUSATE SODIUM 8.6-50 MG PO TABS
1.0000 | ORAL_TABLET | Freq: Two times a day (BID) | ORAL | Status: DC
  Administered 2015-07-21 – 2015-07-24 (×7): 1 via ORAL
  Filled 2015-07-21 (×8): qty 1

## 2015-07-21 MED ORDER — LIDOCAINE VISCOUS 2 % MT SOLN
20.0000 mL | Freq: Four times a day (QID) | OROMUCOSAL | Status: DC
  Filled 2015-07-21 (×4): qty 20

## 2015-07-21 MED ORDER — MAGNESIUM SULFATE 2 GM/50ML IV SOLN
2.0000 g | Freq: Once | INTRAVENOUS | Status: AC
  Administered 2015-07-21: 2 g via INTRAVENOUS
  Filled 2015-07-21: qty 50

## 2015-07-21 NOTE — Progress Notes (Signed)
PROGRESS NOTE  Shane Cantu OAC:[id number] DOB: 24-May-1956 DOA: 07/20/2015 PCP: [name], MD  HPI/Recap of past 24 hours:  Still c/o neck pain, sore throat, nausea, no dysphagia C/o constipation  Assessment/Plan: Active Problems:   Pain, cancer   Dehydration  Intractable cancer pain: iv dilaudid, stool softener  N/v/dehydration: ivf/prn antiemetics  Hypomagnesemia: iv mag replacement  Stage IV tongue cancer :defer to oncology  HTN: hold norvasc due to dehydation  DVT prophylaxis: lovenox  Consultants: oncology  Code Status: full   Family Communication: Patient    Disposition Plan: home when medically stable   Consultants:  oncology  Procedures:  none  Antibiotics:  none   Objective: BP 125/74 mmHg  Pulse 60  Temp(Src) 98.5 F (36.9 C) (Oral)  Resp 18  Ht 5\' 9"  (1.753 m)  Wt 235 lb (106.595 kg)  BMI 34.69 kg/m2  SpO2 95%  Intake/Output Summary (Last 24 hours) at 07/21/15 1726 Last data filed at 07/21/15 1431  Gross per 24 hour  Intake   1200 ml  Output   1050 ml  Net    150 ml   Filed Weights   07/20/15 1530  Weight: 235 lb (106.595 kg)    Exam:   General:  NAD  Cardiovascular: RRR  Respiratory: CTABL  Abdomen: Soft/ND/NT, positive BS  Musculoskeletal: No Edema  Neuro: aaox3  Data Reviewed: Basic Metabolic Panel:  Recent Labs Lab 07/20/15 0939 07/21/15 0500  NA 137 136  K 4.1 3.5  CL  --  100*  CO2 27 30  GLUCOSE 157* 99  BUN 23.9 20  CREATININE 1.3 1.02  CALCIUM 9.7 8.6*  MG 1.3* 1.6*   Liver Function Tests:  Recent Labs Lab 07/20/15 0939  AST 21  ALT 20  ALKPHOS 126  BILITOT 0.57  PROT 7.3  ALBUMIN 3.5   No results for input(s): LIPASE, AMYLASE in the last 168 hours. No results for input(s): AMMONIA in the last 168 hours. CBC:  Recent Labs Lab 07/20/15 0939 07/21/15 0500  WBC 5.2 2.8*  NEUTROABS 4.7  --   HGB 12.4* 9.3*  HCT 36.9* 29.3*  MCV 87.9 88.8  PLT 466* 362   Cardiac  Enzymes:   No results for input(s): CKTOTAL, CKMB, CKMBINDEX, TROPONINI in the last 168 hours. BNP (last 3 results) No results for input(s): BNP in the last 8760 hours.  ProBNP (last 3 results) No results for input(s): PROBNP in the last 8760 hours.  CBG: No results for input(s): GLUCAP in the last 168 hours.  No results found for this or any previous visit (from the past 240 hour(s)).   Studies: No results found.  Scheduled Meds: . aspirin EC  81 mg Oral Daily  . enoxaparin (LOVENOX) injection  40 mg Subcutaneous Q24H  . famotidine  20 mg Oral BID  . gabapentin  600 mg Oral 2 times per day  . hydrocortisone cream   Topical TID  . lidocaine  15 mL Mouth/Throat Q6H  . methadone  20 mg Oral 3 times per day  . modafinil  200 mg Oral Daily  . pantoprazole (PROTONIX) IV  40 mg Intravenous Q12H  . polyethylene glycol  17 g Oral BID  . senna-docusate  1 tablet Oral BID  . traZODone  100 mg Oral QHS    Continuous Infusions:    Time spent: 26min  Kaizlee Carlino MD, PhD  [facility] Pager [phone]. If 7PM-7AM, please contact night-coverage at [email or web address], password [facility] 07/21/2015, 5:26 PM  LOS: 1 day

## 2015-07-22 ENCOUNTER — Other Ambulatory Visit: Payer: Self-pay

## 2015-07-22 ENCOUNTER — Observation Stay (HOSPITAL_COMMUNITY): Payer: PPO

## 2015-07-22 DIAGNOSIS — E860 Dehydration: Secondary | ICD-10-CM | POA: Diagnosis not present

## 2015-07-22 DIAGNOSIS — G893 Neoplasm related pain (acute) (chronic): Secondary | ICD-10-CM | POA: Diagnosis not present

## 2015-07-22 DIAGNOSIS — R05 Cough: Secondary | ICD-10-CM | POA: Diagnosis not present

## 2015-07-22 LAB — BASIC METABOLIC PANEL
Anion gap: 6 (ref 5–15)
BUN: 13 mg/dL (ref 6–20)
CALCIUM: 9 mg/dL (ref 8.9–10.3)
CO2: 29 mmol/L (ref 22–32)
CREATININE: 0.91 mg/dL (ref 0.61–1.24)
Chloride: 103 mmol/L (ref 101–111)
GFR calc non Af Amer: >60 mL/min (ref >60)
GLUCOSE: 95 mg/dL (ref 65–99)
Potassium: 3.7 mmol/L (ref 3.5–5.1)
Sodium: 138 mmol/L (ref 135–145)

## 2015-07-22 LAB — CBC
HEMATOCRIT: 29.2 % — AB (ref 39.0–52.0)
Hemoglobin: 9.6 g/dL — ABNORMAL LOW (ref 13.0–17.0)
MCH: 29.3 pg (ref 26.0–34.0)
MCHC: 32.9 g/dL (ref 30.0–36.0)
MCV: 89 fL (ref 78.0–100.0)
Platelets: 364 10*3/uL (ref 150–400)
RBC: 3.28 MIL/uL — ABNORMAL LOW (ref 4.22–5.81)
RDW: 15.2 % (ref 11.5–15.5)
WBC: 2 10*3/uL — ABNORMAL LOW (ref 4.0–10.5)

## 2015-07-22 LAB — MAGNESIUM: Magnesium: 1.5 mg/dL — ABNORMAL LOW (ref 1.7–2.4)

## 2015-07-22 MED ORDER — FLUTICASONE PROPIONATE 50 MCG/ACT NA SUSP
2.0000 | Freq: Every day | NASAL | Status: DC
  Administered 2015-07-22 – 2015-07-24 (×3): 2 via NASAL
  Filled 2015-07-22: qty 16

## 2015-07-22 MED ORDER — PANTOPRAZOLE SODIUM 40 MG PO TBEC
40.0000 mg | DELAYED_RELEASE_TABLET | Freq: Two times a day (BID) | ORAL | Status: DC
  Administered 2015-07-22 – 2015-07-24 (×4): 40 mg via ORAL
  Filled 2015-07-22 (×6): qty 1

## 2015-07-22 MED ORDER — AMOXICILLIN-POT CLAVULANATE 875-125 MG PO TABS
1.0000 | ORAL_TABLET | Freq: Two times a day (BID) | ORAL | Status: DC
  Administered 2015-07-22 – 2015-07-24 (×5): 1 via ORAL
  Filled 2015-07-22 (×6): qty 1

## 2015-07-22 MED ORDER — MAGNESIUM SULFATE 2 GM/50ML IV SOLN
2.0000 g | Freq: Once | INTRAVENOUS | Status: AC
  Administered 2015-07-22: 2 g via INTRAVENOUS
  Filled 2015-07-22: qty 50

## 2015-07-22 MED ORDER — GUAIFENESIN ER 600 MG PO TB12
600.0000 mg | ORAL_TABLET | Freq: Two times a day (BID) | ORAL | Status: DC
  Administered 2015-07-22 – 2015-07-24 (×5): 600 mg via ORAL
  Filled 2015-07-22 (×6): qty 1

## 2015-07-22 NOTE — Progress Notes (Signed)
Key Points: Use following P&T approved IV to PO antibiotic change policy.  Description contains the criteria that are approved Note: Policy Excludes:  Esophagectomy patientsPHARMACIST - PHYSICIAN COMMUNICATION DR:  Erlinda Hong CONCERNING: IV to Oral Route Change Policy  RECOMMENDATION: This patient is receiving Protonix by the intravenous route.  Based on criteria approved by the Pharmacy and Therapeutics Committee, the intravenous medication(s) is/are being converted to the equivalent oral dose form(s).   DESCRIPTION: These criteria include:  The patient is eating (either orally or via tube) and/or has been taking other orally administered medications for a least 24 hours  The patient has no evidence of active gastrointestinal bleeding or impaired GI absorption (gastrectomy, short bowel, patient on TNA or NPO).  If you have questions about this conversion, please contact the Pharmacy Department  []   [phone] )  [facility] []   [phone] )  [facility] []   [phone] )  [facility] []   [phone] )  [facility] [x]   [phone] )  [place], PharmD, pager [phone]. 07/22/2015,1:26 PM.

## 2015-07-22 NOTE — Progress Notes (Signed)
PROGRESS NOTE  Shane Cantu GUY:[id number] DOB: 09-Oct-1956 DOA: 07/20/2015 PCP: [name], MD  HPI/Recap of past 24 hours:  Reported stable neck pain and sore throat,  Nausea well controlled by medicine, stable neck pain, no dysphagia Constipation has resolved, C/o productive cough with green sputum, more at night, with nasal congestion,   Assessment/Plan: Active Problems:   Pain, cancer   Dehydration  Intractable cancer pain: Better controlled on iv dilaudid, continue stool softener  N/v/dehydration:  Better , continue ivf/prn antiemetics  Hypomagnesemia: continue iv mag replacement  Stage IV tongue cancer:  Wbc trending down, defer to oncology  HTN: hold norvasc due to dehydation  Productive cough, sinusitis /bronchitis? cxr pending, start mucinex/augmentin/flonase  DVT prophylaxis: lovenox  Consultants: oncology  Code Status: full   Family Communication: Patient    Disposition Plan: home when medically stable   Consultants:  oncology  Procedures:  none  Antibiotics:  none   Objective: BP 123/74 mmHg  Pulse 75  Temp(Src) 98.5 F (36.9 C) (Oral)  Resp 18  Ht 5\' 9"  (1.753 m)  Wt 235 lb (106.595 kg)  BMI 34.69 kg/m2  SpO2 96%  Intake/Output Summary (Last 24 hours) at 07/22/15 1122 Last data filed at 07/22/15 0900  Gross per 24 hour  Intake   1800 ml  Output    950 ml  Net    850 ml   Filed Weights   07/20/15 1530  Weight: 235 lb (106.595 kg)    Exam:   General:  NAD  Cardiovascular: RRR  Respiratory: CTABL  Abdomen: Soft/ND/NT, positive BS  Musculoskeletal: No Edema  Neuro: aaox3  Data Reviewed: Basic Metabolic Panel:  Recent Labs Lab 07/20/15 0939 07/21/15 0500 07/22/15 0620  NA 137 136 138  K 4.1 3.5 3.7  CL  --  100* 103  CO2 27 30 29   GLUCOSE 157* 99 95  BUN 23.9 20 13   CREATININE 1.3 1.02 0.91  CALCIUM 9.7 8.6* 9.0  MG 1.3* 1.6* 1.5*   Liver Function Tests:  Recent Labs Lab 07/20/15 0939   AST 21  ALT 20  ALKPHOS 126  BILITOT 0.57  PROT 7.3  ALBUMIN 3.5   No results for input(s): LIPASE, AMYLASE in the last 168 hours. No results for input(s): AMMONIA in the last 168 hours. CBC:  Recent Labs Lab 07/20/15 0939 07/21/15 0500 07/22/15 0620  WBC 5.2 2.8* 2.0*  NEUTROABS 4.7  --   --   HGB 12.4* 9.3* 9.6*  HCT 36.9* 29.3* 29.2*  MCV 87.9 88.8 89.0  PLT 466* 362 364   Cardiac Enzymes:   No results for input(s): CKTOTAL, CKMB, CKMBINDEX, TROPONINI in the last 168 hours. BNP (last 3 results) No results for input(s): BNP in the last 8760 hours.  ProBNP (last 3 results) No results for input(s): PROBNP in the last 8760 hours.  CBG: No results for input(s): GLUCAP in the last 168 hours.  No results found for this or any previous visit (from the past 240 hour(s)).   Studies: No results found.  Scheduled Meds: . amoxicillin-clavulanate  1 tablet Oral Q12H  . aspirin EC  81 mg Oral Daily  . enoxaparin (LOVENOX) injection  40 mg Subcutaneous Q24H  . famotidine  20 mg Oral BID  . fluticasone  2 spray Each Nare Daily  . gabapentin  600 mg Oral 2 times per day  . guaiFENesin  600 mg Oral BID  . hydrocortisone cream   Topical TID  . lidocaine  15 mL  Mouth/Throat Q6H  . methadone  20 mg Oral 3 times per day  . modafinil  200 mg Oral Daily  . pantoprazole (PROTONIX) IV  40 mg Intravenous Q12H  . polyethylene glycol  17 g Oral BID  . senna-docusate  1 tablet Oral BID  . traZODone  100 mg Oral QHS    Continuous Infusions:    Time spent: 59min  Jadee Golebiewski MD, PhD  [facility] Pager [phone]. If 7PM-7AM, please contact night-coverage at [email or web address], password [facility] 07/22/2015, 11:22 AM  LOS: 2 days

## 2015-07-23 ENCOUNTER — Telehealth: Payer: Self-pay | Admitting: *Deleted

## 2015-07-23 ENCOUNTER — Other Ambulatory Visit: Payer: Self-pay | Admitting: Hematology and Oncology

## 2015-07-23 ENCOUNTER — Observation Stay (HOSPITAL_COMMUNITY): Payer: PPO

## 2015-07-23 ENCOUNTER — Telehealth: Payer: Self-pay | Admitting: Hematology and Oncology

## 2015-07-23 ENCOUNTER — Encounter: Payer: Self-pay | Admitting: *Deleted

## 2015-07-23 DIAGNOSIS — J309 Allergic rhinitis, unspecified: Secondary | ICD-10-CM | POA: Diagnosis present

## 2015-07-23 DIAGNOSIS — S8264XD Nondisplaced fracture of lateral malleolus of right fibula, subsequent encounter for closed fracture with routine healing: Secondary | ICD-10-CM | POA: Diagnosis not present

## 2015-07-23 DIAGNOSIS — D630 Anemia in neoplastic disease: Secondary | ICD-10-CM

## 2015-07-23 DIAGNOSIS — X58XXXD Exposure to other specified factors, subsequent encounter: Secondary | ICD-10-CM | POA: Diagnosis present

## 2015-07-23 DIAGNOSIS — C01 Malignant neoplasm of base of tongue: Secondary | ICD-10-CM

## 2015-07-23 DIAGNOSIS — Z87442 Personal history of urinary calculi: Secondary | ICD-10-CM | POA: Diagnosis not present

## 2015-07-23 DIAGNOSIS — R110 Nausea: Secondary | ICD-10-CM | POA: Insufficient documentation

## 2015-07-23 DIAGNOSIS — G629 Polyneuropathy, unspecified: Secondary | ICD-10-CM | POA: Diagnosis present

## 2015-07-23 DIAGNOSIS — M549 Dorsalgia, unspecified: Secondary | ICD-10-CM | POA: Diagnosis present

## 2015-07-23 DIAGNOSIS — E46 Unspecified protein-calorie malnutrition: Secondary | ICD-10-CM | POA: Diagnosis present

## 2015-07-23 DIAGNOSIS — G8929 Other chronic pain: Secondary | ICD-10-CM

## 2015-07-23 DIAGNOSIS — Z6834 Body mass index (BMI) 34.0-34.9, adult: Secondary | ICD-10-CM | POA: Diagnosis not present

## 2015-07-23 DIAGNOSIS — I10 Essential (primary) hypertension: Secondary | ICD-10-CM | POA: Diagnosis present

## 2015-07-23 DIAGNOSIS — K1230 Oral mucositis (ulcerative), unspecified: Secondary | ICD-10-CM

## 2015-07-23 DIAGNOSIS — E860 Dehydration: Secondary | ICD-10-CM | POA: Diagnosis not present

## 2015-07-23 DIAGNOSIS — M542 Cervicalgia: Secondary | ICD-10-CM

## 2015-07-23 DIAGNOSIS — D701 Agranulocytosis secondary to cancer chemotherapy: Secondary | ICD-10-CM | POA: Diagnosis present

## 2015-07-23 DIAGNOSIS — Z885 Allergy status to narcotic agent status: Secondary | ICD-10-CM | POA: Diagnosis not present

## 2015-07-23 DIAGNOSIS — Z79899 Other long term (current) drug therapy: Secondary | ICD-10-CM | POA: Diagnosis not present

## 2015-07-23 DIAGNOSIS — Z808 Family history of malignant neoplasm of other organs or systems: Secondary | ICD-10-CM | POA: Diagnosis not present

## 2015-07-23 DIAGNOSIS — R0981 Nasal congestion: Secondary | ICD-10-CM | POA: Diagnosis not present

## 2015-07-23 DIAGNOSIS — Z7982 Long term (current) use of aspirin: Secondary | ICD-10-CM | POA: Diagnosis not present

## 2015-07-23 DIAGNOSIS — R05 Cough: Secondary | ICD-10-CM | POA: Diagnosis not present

## 2015-07-23 DIAGNOSIS — K219 Gastro-esophageal reflux disease without esophagitis: Secondary | ICD-10-CM | POA: Diagnosis present

## 2015-07-23 DIAGNOSIS — Z79891 Long term (current) use of opiate analgesic: Secondary | ICD-10-CM | POA: Diagnosis not present

## 2015-07-23 DIAGNOSIS — M1990 Unspecified osteoarthritis, unspecified site: Secondary | ICD-10-CM | POA: Diagnosis present

## 2015-07-23 DIAGNOSIS — K5900 Constipation, unspecified: Secondary | ICD-10-CM | POA: Diagnosis present

## 2015-07-23 DIAGNOSIS — L271 Localized skin eruption due to drugs and medicaments taken internally: Secondary | ICD-10-CM | POA: Diagnosis present

## 2015-07-23 DIAGNOSIS — G43909 Migraine, unspecified, not intractable, without status migrainosus: Secondary | ICD-10-CM | POA: Diagnosis present

## 2015-07-23 DIAGNOSIS — C029 Malignant neoplasm of tongue, unspecified: Secondary | ICD-10-CM | POA: Diagnosis present

## 2015-07-23 DIAGNOSIS — T451X5A Adverse effect of antineoplastic and immunosuppressive drugs, initial encounter: Secondary | ICD-10-CM | POA: Diagnosis present

## 2015-07-23 DIAGNOSIS — G4730 Sleep apnea, unspecified: Secondary | ICD-10-CM | POA: Diagnosis present

## 2015-07-23 DIAGNOSIS — G893 Neoplasm related pain (acute) (chronic): Secondary | ICD-10-CM | POA: Diagnosis present

## 2015-07-23 LAB — CBC
HCT: 30.2 % — ABNORMAL LOW (ref 39.0–52.0)
Hemoglobin: 9.8 g/dL — ABNORMAL LOW (ref 13.0–17.0)
MCH: 29 pg (ref 26.0–34.0)
MCHC: 32.5 g/dL (ref 30.0–36.0)
MCV: 89.3 fL (ref 78.0–100.0)
PLATELETS: 295 10*3/uL (ref 150–400)
RBC: 3.38 MIL/uL — AB (ref 4.22–5.81)
RDW: 15.4 % (ref 11.5–15.5)
WBC: 2.1 10*3/uL — ABNORMAL LOW (ref 4.0–10.5)

## 2015-07-23 LAB — EXPECTORATED SPUTUM ASSESSMENT W REFEX TO RESP CULTURE

## 2015-07-23 LAB — BASIC METABOLIC PANEL
Anion gap: 6 (ref 5–15)
BUN: 12 mg/dL (ref 6–20)
CO2: 30 mmol/L (ref 22–32)
CREATININE: 0.92 mg/dL (ref 0.61–1.24)
Calcium: 8.8 mg/dL — ABNORMAL LOW (ref 8.9–10.3)
Chloride: 101 mmol/L (ref 101–111)
Glucose, Bld: 114 mg/dL — ABNORMAL HIGH (ref 65–99)
Potassium: 3.6 mmol/L (ref 3.5–5.1)
SODIUM: 137 mmol/L (ref 135–145)

## 2015-07-23 LAB — HEMOGLOBIN A1C
HEMOGLOBIN A1C: 7.3 % — AB (ref 4.8–5.6)
MEAN PLASMA GLUCOSE: 163 mg/dL

## 2015-07-23 LAB — MAGNESIUM: MAGNESIUM: 1.4 mg/dL — AB (ref 1.7–2.4)

## 2015-07-23 LAB — EXPECTORATED SPUTUM ASSESSMENT W GRAM STAIN, RFLX TO RESP C

## 2015-07-23 MED ORDER — POTASSIUM CHLORIDE IN NACL 20-0.9 MEQ/L-% IV SOLN
INTRAVENOUS | Status: DC
  Administered 2015-07-23 – 2015-07-24 (×2): via INTRAVENOUS
  Filled 2015-07-23 (×3): qty 1000

## 2015-07-23 MED ORDER — HYDROMORPHONE HCL 1 MG/ML IJ SOLN
1.0000 mg | INTRAMUSCULAR | Status: DC | PRN
  Administered 2015-07-23 – 2015-07-24 (×2): 1 mg via INTRAVENOUS
  Filled 2015-07-23 (×2): qty 1

## 2015-07-23 MED ORDER — MAGNESIUM SULFATE 2 GM/50ML IV SOLN
2.0000 g | Freq: Once | INTRAVENOUS | Status: AC
  Administered 2015-07-23: 2 g via INTRAVENOUS
  Filled 2015-07-23: qty 50

## 2015-07-23 NOTE — Telephone Encounter (Signed)
Pt confirmed labs/ov per 08/22 POF, gave pt avs and calendar.... KJ,  Sent msg to r/s chemo and nut

## 2015-07-23 NOTE — Telephone Encounter (Signed)
Per staff message and POF I have scheduled appts. Advised scheduler of appts. JMW  

## 2015-07-23 NOTE — Telephone Encounter (Signed)
Entry error

## 2015-07-23 NOTE — Progress Notes (Signed)
PROGRESS NOTE  Shane Cantu YYT:[id number] DOB: 1956-05-26 DOA: 07/20/2015 PCP: [name], MD  HPI/Recap of past 24 hours:  Reported feeling better overall, pain and nausea better controlled. Mag still low. Want to see his oncologist and Wondering if can see his orthopedic surgeon for right ankle fracture.  Assessment/Plan: Active Problems:   Pain, cancer   Dehydration  Intractable cancer pain: Better controlled on iv dilaudid, continue stool softener Transition to oral pain meds.  N/v/dehydration:  Better , continue ivf/prn antiemetics  Hypomagnesemia: remain low, continue iv mag replacement  Stage IV tongue cancer:  Wbc trending down, defer to oncology  HTN: hold norvasc due to dehydation  Productive cough, sinusitis /bronchitis? cxr unremarkble, good response to mucinex/augmentin/flonase  Right ankle fracture, non displaced , (happened early July), still nonweight bearing, orthopedic surgeon Dr. Erlinda Hong notified, he will see patient this pm, will repeat right ankle x ray, further weight bearing status and PT instruction per ortho.  DVT prophylaxis: lovenox  Consultants: oncology  Code Status: full   Family Communication: Patient    Disposition Plan: home when medically stable, likely tomorrow   Consultants:  Oncology  orthopedics  Procedures:  none  Antibiotics:  Oral augmentin   Objective: BP 121/77 mmHg  Pulse 76  Temp(Src) 98.1 F (36.7 C) (Oral)  Resp 16  Ht 5\' 9"  (1.753 m)  Wt 235 lb (106.595 kg)  BMI 34.69 kg/m2  SpO2 93%  Intake/Output Summary (Last 24 hours) at 07/23/15 1301 Last data filed at 07/23/15 1201  Gross per 24 hour  Intake    840 ml  Output    300 ml  Net    540 ml   Filed Weights   07/20/15 1530  Weight: 235 lb (106.595 kg)    Exam:   General:  NAD  Cardiovascular: RRR  Respiratory: CTABL  Abdomen: Soft/ND/NT, positive BS  Musculoskeletal: No Edema  Neuro: aaox3  Data Reviewed: Basic Metabolic  Panel:  Recent Labs Lab 07/20/15 0939 07/21/15 0500 07/22/15 0620 07/23/15 0630  NA 137 136 138 137  K 4.1 3.5 3.7 3.6  CL  --  100* 103 101  CO2 27 30 29 30   GLUCOSE 157* 99 95 114*  BUN 23.9 20 13 12   CREATININE 1.3 1.02 0.91 0.92  CALCIUM 9.7 8.6* 9.0 8.8*  MG 1.3* 1.6* 1.5* 1.4*   Liver Function Tests:  Recent Labs Lab 07/20/15 0939  AST 21  ALT 20  ALKPHOS 126  BILITOT 0.57  PROT 7.3  ALBUMIN 3.5   No results for input(s): LIPASE, AMYLASE in the last 168 hours. No results for input(s): AMMONIA in the last 168 hours. CBC:  Recent Labs Lab 07/20/15 0939 07/21/15 0500 07/22/15 0620 07/23/15 0630  WBC 5.2 2.8* 2.0* 2.1*  NEUTROABS 4.7  --   --   --   HGB 12.4* 9.3* 9.6* 9.8*  HCT 36.9* 29.3* 29.2* 30.2*  MCV 87.9 88.8 89.0 89.3  PLT 466* 362 364 295   Cardiac Enzymes:   No results for input(s): CKTOTAL, CKMB, CKMBINDEX, TROPONINI in the last 168 hours. BNP (last 3 results) No results for input(s): BNP in the last 8760 hours.  ProBNP (last 3 results) No results for input(s): PROBNP in the last 8760 hours.  CBG: No results for input(s): GLUCAP in the last 168 hours.  Recent Results (from the past 240 hour(s))  Culture, expectorated sputum-assessment     Status: None   Collection Time: 07/23/15  6:38 AM  Result Value Ref  Range Status   Specimen Description SPUTUM  Final   Special Requests NONE  Final   Sputum evaluation   Final    THIS SPECIMEN IS ACCEPTABLE. RESPIRATORY CULTURE REPORT TO FOLLOW.   Report Status 07/23/2015 FINAL  Final     Studies: Dg Chest 2 View  07/22/2015   CLINICAL DATA:  Patient with cough, congestion and sore throat.  EXAM: CHEST  2 VIEW  COMPARISON:  Chest radiograph 06/04/2015  FINDINGS: Anterior chest wall Port-A-Cath is stable in position with tip projecting over the superior vena cava. Stable cardiac and mediastinal contours. No consolidative pulmonary opacities. No pleural effusion or pneumothorax. Mid thoracic spine  degenerative changes.  IMPRESSION: No acute cardiopulmonary process.   Electronically Signed   By: Lovey Newcomer M.D.   On: 07/22/2015 15:07    Scheduled Meds: . amoxicillin-clavulanate  1 tablet Oral Q12H  . aspirin EC  81 mg Oral Daily  . enoxaparin (LOVENOX) injection  40 mg Subcutaneous Q24H  . famotidine  20 mg Oral BID  . fluticasone  2 spray Each Nare Daily  . gabapentin  600 mg Oral 2 times per day  . guaiFENesin  600 mg Oral BID  . hydrocortisone cream   Topical TID  . lidocaine  15 mL Mouth/Throat Q6H  . methadone  20 mg Oral 3 times per day  . modafinil  200 mg Oral Daily  . pantoprazole  40 mg Oral BID  . polyethylene glycol  17 g Oral BID  . senna-docusate  1 tablet Oral BID  . traZODone  100 mg Oral QHS    Continuous Infusions:    Time spent: 12min  Mikal Wisman MD, PhD  [facility] Pager [phone]. If 7PM-7AM, please contact night-coverage at [email or web address], password [facility] 07/23/2015, 1:01 PM  LOS: 3 days

## 2015-07-23 NOTE — Progress Notes (Signed)
  Oncology Nurse Navigator Documentation   Went to check on patient in [place], he was sleeping soundly.  Spoke with his RN, Beverlee Nims, who stated he is receiving IV pain medication regularly, magnesium level is still low though climbing. I called patient's dtr, Amy, to provide update.  Gayleen Orem, RN, BSN, [place] at [place] [phone]

## 2015-07-23 NOTE — Telephone Encounter (Signed)
Added Erb per 08/22 POF.Marland KitchenMarland KitchenMarland Kitchen

## 2015-07-23 NOTE — Telephone Encounter (Signed)
  Oncology Nurse Navigator Documentation   Navigator Encounter Type: Telephone (07/23/15 1715)     Per dtr Amy's call this morning, I called to provide her an update of her dad's well-being.  DC is potentially tomorrow.    I provided times for daily IVF starting this Wednesday through next week. She requested a call with results of 9/1 PET stating it would be good to have the weekend to begin discussing future plans, treatment and otherwise.  She stated she doesn't think he will want to continue tmt if results are not encouraging.  I informed [name].     Gayleen Orem, RN, BSN, [place] at [place] [phone]

## 2015-07-23 NOTE — Progress Notes (Signed)
xrays show signs of healing and maintained alignment.  At this point, he may WBAT in fracture boot and work with PT.  i'll see him in the office in a couple of weeks after discharge.

## 2015-07-23 NOTE — Consult Note (Signed)
[place]  [phone]   Requesting Provider: [facility]  Consulting Provider: ,MD  Primary Oncologist: ,MD  Patient Care Team: [name], MD as PCP - General (Family Medicine) Leota Sauers, RN as Oncology Nurse Navigator [name], MD as Consulting Physician (Hematology and Oncology) Eppie Gibson, MD as Attending Physician (Radiation Oncology) Karie Mainland, RD as Dietitian (Nutrition)  HOSPITAL CONSULTATION  NOTE I have seen the patient, examined him and edited the notes as follows  Reason for Consultation:   HPI: Shane Cantu is a 59 year old man with a history of Tongue Cancer as detailed below, admitted on 8/19 with severe neck pain and sore throat and mucositis. He was weak. Denied fevers, chills, night sweats, vision changes. He reported mucositis. He denied shortness of breath but had productive cough with green sputum production. Denied any chest pain or palpitations. Denied lower extremity swelling.  He reported nausea due to his mucus production, but denied heartburn. He reported constipation on admission, now resolved. Appetite was decreased due to pain and nausea. He denied abdominal pain. Denied any dysuria. Denied abnormal skin rashes, or neuropathy. Denied any bleeding issues such as epistaxis, hematemesis, hematuria or hematochezia.  He has not ambulated since admission due to his right fibular fracture sustained back in July. Symptoms are now improving, controlled pain meds, guaifenesin, antiemetics, and IV fluids.     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   04/13/2015 Initial Biopsy Accession [id number]:  Lymph node, needle/core biopsy - SCC, p16 positive.   04/18/2015 Initial Diagnosis Tongue cancer   04/24/2015 Imaging PET CT showed tongue cancer, lung nodule and possible bone mets   04/26/2015 Surgery He had dental  extractions   05/03/2015 Imaging CT neck with contrast: L base of tongue with regional adenopathy, metastatic disease C2, C3, T2 vertebra; posterior L fourth rib.   05/09/2015 Procedure Port-a-cath placed.   05/11/2015 - 05/23/2015 Radiation Therapy He received palliative radiation therapy   06/01/2015 -  Chemotherapy He started cycle 1 of carboplatin, 5-FU and cetuximab   06/04/2015 - 06/05/2015 Hospital Admission He was admitted to the hospital after syncopal episode and had right nondisplaced fibular fracture   06/22/15 Chemotherapy S/p cycle 2 with carboplatin, 5 FU and cetuximab   07/13/15 Chemotherapy S/p cycle 3 with carboplatin, 5 FU and cetuximab   07/20/15 Hospital Admission He was admitted for the management of sore throat, nausea, cough and neck pain.     Past Medical History  Diagnosis Date  . Chronic pain   . Back pain   . Chronic neck pain   . History of kidney stones   . Allergic rhinitis   . Hypertension   . Migraine headache without aura   . Sleep apnea     does not use cpap  . GERD (gastroesophageal reflux disease)   . Neuropathy   . Arthritis   . Cancer      may 2016 tongue cancer     MEDICATIONS:  Scheduled Meds: . amoxicillin-clavulanate  1 tablet Oral Q12H  . aspirin EC  81 mg Oral Daily  . enoxaparin (LOVENOX) injection  40 mg Subcutaneous Q24H  . famotidine  20 mg Oral BID  . fluticasone  2 spray Each Nare Daily  . gabapentin  600 mg Oral 2 times per day  . guaiFENesin  600 mg Oral BID  .  hydrocortisone cream   Topical TID  . lidocaine  15 mL Mouth/Throat Q6H  . methadone  20 mg Oral 3 times per day  . modafinil  200 mg Oral Daily  . pantoprazole  40 mg Oral BID  . polyethylene glycol  17 g Oral BID  . senna-docusate  1 tablet Oral BID  . traZODone  100 mg Oral QHS   Continuous Infusions:  PRN Meds:.diphenhydrAMINE, hydrocerin, HYDROmorphone (DILAUDID) injection, ondansetron (ZOFRAN) IV, promethazine  ALLERGIES:  Allergies  Allergen Reactions  .  Morphine And Related Anaphylaxis    Review of systems:   Constitutional: Denies fevers, chills or abnormal night sweats Eyes: Denies blurriness of vision, double vision or watery eyes Ears, nose, mouth, throat, and face: Mucositis and sore throat improving Respiratory: Improved cough, denies dyspnea or wheezes Cardiovascular: Denies palpitation, chest discomfort or lower extremity swelling Gastrointestinal:  He reports nausea on admission, better controlled at this time, denies heartburn or change in bowel habits. He is trying to push some liquids.  Skin: Denies abnormal skin rashes Lymphatics: Denies new lymphadenopathy or easy bruising Neurological:Denies numbness, tingling or new weaknesses Behavioral/Psych: Mood is stable, no new changes  All other systems were reviewed with the patient and are negative.    Family History  Problem Relation Age of Onset  . Cancer Father     throat ca  . Cancer Brother     pituitary ca  . Heart disease Mother      Past Surgical History  Procedure Laterality Date  . Back surgery  03/1993, 10/2002    Dr. Louanne Skye  . Tonsillectomy      as a child  . Neck surgery  2004    Dr. Louanne Skye  . Lymph node biopsy    . Nasal sinusotomy  1977  . Colonoscopy    . Multiple extractions with alveoloplasty N/A 04/26/2015    Procedure: Extraction of tooth #'s 6,11,12,15,20,21,22,27,28 with alveoloplasty;  Surgeon: [name], DDS;  Location: [place];  Service: Oral Surgery;  Laterality: N/A;    Social History   Social History  . Marital Status: Divorced    Spouse Name: N/A  . Number of Children: 2  . Years of Education: N/A   Occupational History  . Not on file.   Social History Main Topics  . Smoking status: Never Smoker   . Smokeless tobacco: Never Used  . Alcohol Use: Yes     Comment: once a month  . Drug Use: No  . Sexual Activity: No   Other Topics Concern  . Not on file   Social History Narrative   The patient is divorced with 2  children.   Patient is disabled. Patient previously worked with Pharmacologist.   Patient lives in [place].   Patient has never smoked. Patient has never used smokeless tobacco.   Patient with occasional use of alcohol.        PHYSICAL EXAMINATION:  Filed Vitals:   07/23/15 0630  BP: 121/77  Pulse: 76  Temp: 98.1 F (36.7 C)  Resp: 16      Intake/Output Summary (Last 24 hours) at 07/23/15 0648 Last data filed at 07/23/15 0600  Gross per 24 hour  Intake   1440 ml  Output    200 ml  Net   1240 ml     GENERAL:alert, no distress and comfortable SKIN: skin color, texture, turgor are normal, no rashes or significant lesions EYES: normal, conjunctiva are pink and non-injected, sclera clear OROPHARYNX: no  exudate, no erythema and lips, buccal mucosa, and tongue normal. Mucous material noted in mouth cavity  NECK: supple, thyroid normal size, non-tender, without nodularity LYMPH:  no palpable lymphadenopathy in the cervical, axillary or inguinal LUNGS: clear to auscultation and percussion with normal breathing effort HEART: regular rate & rhythm and no murmurs and no lower extremity edema ABDOMEN: soft, non-tender and normal bowel sounds Musculoskeletal:no cyanosis of digits and no clubbing. Tender at the right ankle area due to recent fracture PSYCH: alert & oriented x 3 with fluent speech NEURO: no focal motor/sensory deficits  LABORATORY/RADIOLOGY DATA:   Recent Labs Lab 07/20/15 0939 07/21/15 0500 07/22/15 0620  WBC 5.2 2.8* 2.0*  HGB 12.4* 9.3* 9.6*  HCT 36.9* 29.3* 29.2*  PLT 466* 362 364  MCV 87.9 88.8 89.0  MCH 29.5 28.2 29.3  MCHC 33.6 31.7 32.9  RDW 15.6* 15.5 15.2  LYMPHSABS 0.3*  --   --   MONOABS 0.1  --   --   EOSABS 0.0  --   --   BASOSABS 0.0  --   --     CMP    Recent Labs Lab 07/20/15 0939 07/21/15 0500 07/22/15 0620  NA 137 136 138  K 4.1 3.5 3.7  CL  --  100* 103  CO2 27 30 29   GLUCOSE 157* 99 95  BUN 23.9 20 13   CREATININE  1.3 1.02 0.91  CALCIUM 9.7 8.6* 9.0  MG 1.3* 1.6* 1.5*  AST 21  --   --   ALT 20  --   --   ALKPHOS 126  --   --   BILITOT 0.57  --   --         Component Value Date/Time   BILITOT 0.57 07/20/2015 0939   BILITOT 0.4 11/20/2014 1115     Recent Labs  07/21/15 0500  TSH 2.035      Liver Function Tests:  Recent Labs Lab 07/20/15 0939  AST 21  ALT 20  ALKPHOS 126  BILITOT 0.57  PROT 7.3  ALBUMIN 3.5    Thyroid function studies  Recent Labs  07/21/15 0500  TSH 2.035    Radiology Studies:  Dg Chest 2 View  07/22/2015   CLINICAL DATA:  Patient with cough, congestion and sore throat.  EXAM: CHEST  2 VIEW  COMPARISON:  Chest radiograph 06/04/2015  FINDINGS: Anterior chest wall Port-A-Cath is stable in position with tip projecting over the superior vena cava. Stable cardiac and mediastinal contours. No consolidative pulmonary opacities. No pleural effusion or pneumothorax. Mid thoracic spine degenerative changes.  IMPRESSION: No acute cardiopulmonary process.   Electronically Signed   By: Lovey Newcomer M.D.   On: 07/22/2015 15:07   ASSESSMENT AND PLAN:  Tongue cancer Treatment was paced on hold until he recovers from recent side effects of treatment, last on 07/13/15 He was admitted on 8/19 due to uncontrollable nausea, unable able to have any oral intake at all. He was admitted to the hospital for IV fluids, IV anti-emetics and IV pain medicine. He was not able to take oral breakthrough pain medicine. Symptoms are now better controlled I will cancel cetuximab this week but will resume next week. We'll continue aggressive supportive care   Acute on Chronic neck pain He has chronic back pain and recent mucositis pain. He was admitted for IV pain medicine for better pain control, improving Recommend transition him back to oral pain medications upon discharge  Nausea In the setting of recent chemotherapy and increased  mucous production On presentation he was  dehydrated from uncontrolled nausea. As mentioned above he was admitted for IV anti-emetics and IV fluids with improvement of symptoms I will schedule some IV fluids appointment throughout the rest of the week and next week  Hypomagnesemia He has low magnesium likely related to recent side effects of treatment. He received IV fluids and IV magnesium replacement with good response Current Mg levels are 1.5 from 1.3 prior day He may need oral magnesium replacement therapy upon discharge  Anemia in neoplastic disease Leukopenia In the setting of recent chemotherapy No bleeding issues are reported at this time Check differential in a.m No interventions are recommended Will observe.  DVT prophylaxis On Lovenox  Malnutrition Consider Nutrition evaluation  Deconditioning Consider OT/PT involvement  Full Code  Discharge planning I will set up appointment for IV fluid support for the rest of the week and see him back next week. Hopefully DC tomorrow  Other medical issues as per admitting team    [facility] E, PA-C 07/23/2015, 6:48 AM  Everado Pillsbury, MD 07/23/2015

## 2015-07-24 ENCOUNTER — Other Ambulatory Visit: Payer: Self-pay | Admitting: Hematology and Oncology

## 2015-07-24 ENCOUNTER — Encounter: Payer: Self-pay | Admitting: Nutrition

## 2015-07-24 ENCOUNTER — Encounter (HOSPITAL_COMMUNITY): Payer: Self-pay | Admitting: Dentistry

## 2015-07-24 DIAGNOSIS — M542 Cervicalgia: Secondary | ICD-10-CM

## 2015-07-24 DIAGNOSIS — C01 Malignant neoplasm of base of tongue: Secondary | ICD-10-CM

## 2015-07-24 DIAGNOSIS — G8929 Other chronic pain: Secondary | ICD-10-CM

## 2015-07-24 LAB — BASIC METABOLIC PANEL
Anion gap: 7 (ref 5–15)
BUN: 13 mg/dL (ref 6–20)
CHLORIDE: 99 mmol/L — AB (ref 101–111)
CO2: 31 mmol/L (ref 22–32)
Calcium: 8.7 mg/dL — ABNORMAL LOW (ref 8.9–10.3)
Creatinine, Ser: 0.91 mg/dL (ref 0.61–1.24)
GFR calc Af Amer: >60 mL/min (ref >60)
GFR calc non Af Amer: >60 mL/min (ref >60)
GLUCOSE: 95 mg/dL (ref 65–99)
POTASSIUM: 3.7 mmol/L (ref 3.5–5.1)
Sodium: 137 mmol/L (ref 135–145)

## 2015-07-24 LAB — CBC
HEMATOCRIT: 30.1 % — AB (ref 39.0–52.0)
HEMOGLOBIN: 9.2 g/dL — AB (ref 13.0–17.0)
MCH: 27.5 pg (ref 26.0–34.0)
MCHC: 30.6 g/dL (ref 30.0–36.0)
MCV: 89.9 fL (ref 78.0–100.0)
Platelets: 223 10*3/uL (ref 150–400)
RBC: 3.35 MIL/uL — AB (ref 4.22–5.81)
RDW: 15.4 % (ref 11.5–15.5)
WBC: 2.4 10*3/uL — ABNORMAL LOW (ref 4.0–10.5)

## 2015-07-24 LAB — MAGNESIUM: MAGNESIUM: 1.4 mg/dL — AB (ref 1.7–2.4)

## 2015-07-24 MED ORDER — HYDROMORPHONE HCL 4 MG PO TABS: 4.0000 mg | ORAL_TABLET | ORAL | Status: DC | PRN

## 2015-07-24 MED ORDER — AMOXICILLIN-POT CLAVULANATE 875-125 MG PO TABS: 1.0000 | ORAL_TABLET | Freq: Two times a day (BID) | ORAL | Status: DC

## 2015-07-24 MED ORDER — GUAIFENESIN ER 600 MG PO TB12: 600.0000 mg | ORAL_TABLET | Freq: Two times a day (BID) | ORAL | Status: DC

## 2015-07-24 MED ORDER — HEPARIN SOD (PORK) LOCK FLUSH 100 UNIT/ML IV SOLN: 500.0000 [IU] | INTRAVENOUS | Status: DC | PRN

## 2015-07-24 MED ORDER — FLUTICASONE PROPIONATE 50 MCG/ACT NA SUSP: 2.0000 | Freq: Every day | NASAL | Status: AC

## 2015-07-24 MED ORDER — MAGNESIUM SULFATE 2 GM/50ML IV SOLN
2.0000 g | Freq: Once | INTRAVENOUS | Status: AC
  Administered 2015-07-24: 2 g via INTRAVENOUS
  Filled 2015-07-24: qty 50

## 2015-07-24 MED ORDER — HEPARIN SOD (PORK) LOCK FLUSH 100 UNIT/ML IV SOLN
500.0000 [IU] | INTRAVENOUS | Status: DC
  Filled 2015-07-24: qty 5

## 2015-07-24 MED ORDER — MAGNESIUM OXIDE 400 MG PO CAPS: 400.0000 mg | ORAL_CAPSULE | Freq: Every day | ORAL | Status: AC

## 2015-07-24 NOTE — Care Management Important Message (Signed)
Important Message  Patient Details  Name: Shane Cantu MRN: [id number] Date of Birth: [birth year]   Medicare Important Message Given:  [facility] notification given    Camillo Flaming 07/24/2015, 11:55 AMImportant Message  Patient Details  Name: Shane Cantu MRN: [id number] Date of Birth: 1956/03/19   Medicare Important Message Given:  Yes-second notification given    Camillo Flaming 07/24/2015, 11:55 AM

## 2015-07-24 NOTE — Discharge Summary (Signed)
Discharge Summary  Shane Cantu [place] IWP:[id number] DOB: Aug 20, 1956  PCP: [name], MD  Admit date: 07/20/2015 Discharge date: 07/24/2015  Time spent: <28mins  Recommendations for Outpatient Follow-up:  1. F/u with oncology 2. F/u with orthopedics  Discharge Diagnoses:  Active Hospital Problems   Diagnosis Date Noted  . Nausea   . Dehydration 07/21/2015  . Pain, cancer 07/20/2015    Resolved Hospital Problems   Diagnosis Date Noted Date Resolved  No resolved problems to display.    Discharge Condition: stable  Diet recommendation: heart healthy  Filed Weights   07/20/15 1530  Weight: 235 lb (106.595 kg)    History of present illness:  MISTER Shane Cantu is a 59 y.o. male   With h/o stage iv headneck cancer is sent from oncology clinic for direct admission due to above complaints,  He came to get weekly chemotherapy, found to have dehydration, significant hypomagnesemia, severe cancer pain, his scheduled chemotherapy today held , oncology requested hospitalist to admit the patient with oncology following patient while he is in the hospital.  Patient denies dysphagia, no cough, no fever, intermittent constipation due to pain meds and diarrhea after chemo. No edema, has some rash on chest and upper back due to cancer drugs.  Hospital Course:  Active Problems:   Pain, cancer   Dehydration   Nausea  Intractable cancer pain: Better controlled on iv dilaudid, continue stool softener Transition to oral pain meds.  N/v/dehydration:  Better , received ivf/prn antiemetics. Patient is on ensure.  Hypomagnesemia: remain low, received daily iv mag replacement, discharged with oral magnesium oxide.  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 cxr unremarkble, good response to mucinex/augmentin/flonase  Right ankle fracture, non displaced , (happened early July),  has been nonweight bearing for the last month, orthopedic surgeon Dr. Erlinda Hong notified, per orthopedics, repeat ankle x ray showed signs of healing and maintained alignment, recommend WBAT in fracture boot and PT.    Code Status: full   Family Communication: Patient    Disposition Plan: home on 8/23   Consultants:  Oncology  orthopedics  Procedures:  none  Antibiotics:  Oral augmentin  Discharge Exam: BP 137/74 mmHg  Pulse 66  Temp(Src) 97.7 F (36.5 C) (Oral)  Resp 18  Ht 5\' 9"  (1.753 m)  Wt 235 lb (106.595 kg)  BMI 34.69 kg/m2  SpO2 95%   General: NAD  Cardiovascular: RRR  Respiratory: CTABL  Abdomen: Soft/ND/NT, positive BS  Musculoskeletal: No Edema  Neuro: aaox3   Discharge Instructions You were cared for by a hospitalist during your hospital stay. If you have any questions about your discharge medications or the care you received while you were in the hospital after you are discharged, you can call the unit and asked to speak with the hospitalist on call if the hospitalist that took care of you is not available. Once you are discharged, your primary care physician will handle any further medical issues. Please note that NO REFILLS for any discharge medications will be authorized once you are discharged, as it is imperative that you return to your primary care physician (or establish a relationship with a primary care physician if you do not have one) for your aftercare needs so that they can reassess your need for medications and monitor your lab values.  Discharge Instructions    Diet - low sodium heart healthy    Complete by:  As directed      Increase activity slowly    Complete by:  As directed             Medication List    TAKE these medications        amLODipine 5 MG tablet  Commonly known as:  NORVASC  Take 5 mg by mouth daily.     amoxicillin-clavulanate 875-125 MG per tablet  Commonly known as:  AUGMENTIN  Take 1 tablet by mouth  every 12 (twelve) hours.     Armodafinil 250 MG tablet  Take 250 mg by mouth daily.     aspirin EC 81 MG tablet  Take 81 mg by mouth daily.     clindamycin 1 % gel  Commonly known as:  CLINDAGEL  APPLY TO AFFECTED AREA(S) TWICE DAILY     dexamethasone 4 MG tablet  Commonly known as:  DECADRON  Take 1 tablet (4 mg total) by mouth 2 (two) times daily.     diphenhydrAMINE 25 mg capsule  Commonly known as:  BENADRYL  Take 25 mg by mouth every 6 (six) hours as needed for itching.     emollient cream  Commonly known as:  BIAFINE  Apply 1 application topically 3 (three) times daily as needed (pain).     famotidine 20 MG tablet  Commonly known as:  PEPCID  Take 20 mg by mouth 2 (two) times daily.     fentaNYL 25 MCG/HR patch  Commonly known as:  DURAGESIC - dosed mcg/hr  Place 1 patch (25 mcg total) onto the skin every 3 (three) days.     gabapentin 100 MG capsule  Commonly known as:  NEURONTIN  Take 600 mg by mouth 2 (two) times daily. Takes with supper and at bedtime     guaiFENesin 600 MG 12 hr tablet  Commonly known as:  MUCINEX  Take 1 tablet (600 mg total) by mouth 2 (two) times daily.     hydrocortisone cream 0.5 %  Apply topically 3 (three) times daily.     HYDROmorphone 4 MG tablet  Commonly known as:  DILAUDID  Take 1 tablet (4 mg total) by mouth every 4 (four) hours as needed for severe pain.     ibuprofen 200 MG tablet  Commonly known as:  ADVIL,MOTRIN  Take 600 mg by mouth every 6 (six) hours as needed for mild pain or moderate pain.     lansoprazole 30 MG capsule  Commonly known as:  PREVACID  Take 30 mg by mouth daily at 12 noon.     lidocaine 2 % solution  Commonly known as:  XYLOCAINE  Patient: Mix 1part 2% viscous lidocaine, 1part H20. Swish and/or swallow 87mL of this mixture, 11min before meals and at bedtime, up to QID     lidocaine 5 %  Commonly known as:  LIDODERM  Place 1 patch onto the skin daily. Remove & Discard patch within 12 hours or  as directed by MD     lidocaine-prilocaine cream  Commonly known as:  EMLA  Apply 1 application topically as needed. To Port a cath site one hour prior to needle stick     methadone 10 MG tablet  Commonly known as:  DOLOPHINE  Take 2 tablets (20 mg total) by mouth every 8 (eight) hours.     ondansetron 8 MG tablet  Commonly known as:  ZOFRAN  Take 1 tablet (8 mg total) by mouth every 8 (eight) hours as needed for nausea.  polyethylene glycol powder powder  Commonly known as:  GLYCOLAX/MIRALAX  Take 17 g by mouth 2 (two) times daily.     promethazine 25 MG tablet  Commonly known as:  PHENERGAN  Take 1 tablet (25 mg total) by mouth every 6 (six) hours as needed for nausea.     PUMP IN STYLE ADVANCED Misc  Inject 1 cartridge into the vein See admin instructions. Adrucil 9350 mg  2.70ml/hr. Runs for 96 hours     scopolamine 1 MG/3DAYS  Commonly known as:  TRANSDERM-SCOP  Place 1 patch (1.5 mg total) onto the skin every 3 (three) days.     sucralfate 1 G tablet  Commonly known as:  CARAFATE  Dissolve 1 tablet in 10 mL H20 and swallow up to QID as needed for sore throat     traZODone 100 MG tablet  Commonly known as:  DESYREL  Take 100 mg by mouth at bedtime.       Allergies  Allergen Reactions  . Morphine And Related Anaphylaxis       Follow-up Information    Follow up with Marianna Payment, MD In 2 weeks.   Specialty:  Orthopedic Surgery   Why:  right ankle fracture   Contact information:   [address] [place] [address] [phone]       Follow up with [facility], NI, MD In 1 week.   Specialty:  Hematology and Oncology   Contact information:   [place] [id number] [phone]        The results of significant diagnostics from this hospitalization (including imaging, microbiology, ancillary and laboratory) are listed below for reference.    Significant Diagnostic Studies: Dg Chest 2 View  07/22/2015   CLINICAL DATA:   Patient with cough, congestion and sore throat.  EXAM: CHEST  2 VIEW  COMPARISON:  Chest radiograph 06/04/2015  FINDINGS: Anterior chest wall Port-A-Cath is stable in position with tip projecting over the superior vena cava. Stable cardiac and mediastinal contours. No consolidative pulmonary opacities. No pleural effusion or pneumothorax. Mid thoracic spine degenerative changes.  IMPRESSION: No acute cardiopulmonary process.   Electronically Signed   By: Lovey Newcomer M.D.   On: 07/22/2015 15:07   Dg Ankle Complete Right  07/23/2015   CLINICAL DATA:  Right ankle fracture.  EXAM: RIGHT ANKLE - COMPLETE 3+ VIEW  COMPARISON:  06/04/2015  FINDINGS: Oblique fracture of the lateral malleolus, Weber B morphology. Medial mortise remains widened at 7 mm, indicating deltoid ligament tear. There is some early healing response along the lateral malleolar fracture without union. I do not observe a posterior malleolar component of the fracture.  Plantar and Achilles calcaneal spurs. No other fractures identified.  IMPRESSION: 1. Oblique lateral malleolar fracture compatible with Weber B injury (supination -external rotation). Abnormally widened medial mortise indicating deltoid ligament tear. This fracture is typically considered unstable.   Electronically Signed   By: Van Clines M.D.   On: 07/23/2015 13:46    Microbiology: Recent Results (from the past 240 hour(s))  Culture, expectorated sputum-assessment     Status: None   Collection Time: 07/23/15  6:38 AM  Result Value Ref Range Status   Specimen Description SPUTUM  Final   Special Requests NONE  Final   Sputum evaluation   Final    THIS SPECIMEN IS ACCEPTABLE. RESPIRATORY CULTURE REPORT TO FOLLOW.   Report Status 07/23/2015 FINAL  Final  Culture, respiratory (NON-Expectorated)     Status: None (Preliminary result)  Collection Time: 07/23/15  6:38 AM  Result Value Ref Range Status   Specimen Description SPUTUM  Final   Special Requests NONE  Final     Gram Stain   Final    NO WBC SEEN FEW SQUAMOUS EPITHELIAL CELLS PRESENT FEW GRAM POSITIVE COCCI IN CLUSTERS Performed at [facility]    Culture   Final    NORMAL OROPHARYNGEAL FLORA Performed at [facility]    Report Status PENDING  Incomplete     Labs: Basic Metabolic Panel:  Recent Labs Lab 07/20/15 0939 07/21/15 0500 07/22/15 0620 07/23/15 0630 07/24/15 0500  NA 137 136 138 137 137  K 4.1 3.5 3.7 3.6 3.7  CL  --  100* 103 101 99*  CO2 27 30 29 30 31   GLUCOSE 157* 99 95 114* 95  BUN 23.9 20 13 12 13   CREATININE 1.3 1.02 0.91 0.92 0.91  CALCIUM 9.7 8.6* 9.0 8.8* 8.7*  MG 1.3* 1.6* 1.5* 1.4* 1.4*   Liver Function Tests:  Recent Labs Lab 07/20/15 0939  AST 21  ALT 20  ALKPHOS 126  BILITOT 0.57  PROT 7.3  ALBUMIN 3.5   No results for input(s): LIPASE, AMYLASE in the last 168 hours. No results for input(s): AMMONIA in the last 168 hours. CBC:  Recent Labs Lab 07/20/15 0939 07/21/15 0500 07/22/15 0620 07/23/15 0630 07/24/15 0500  WBC 5.2 2.8* 2.0* 2.1* 2.4*  NEUTROABS 4.7  --   --   --   --   HGB 12.4* 9.3* 9.6* 9.8* 9.2*  HCT 36.9* 29.3* 29.2* 30.2* 30.1*  MCV 87.9 88.8 89.0 89.3 89.9  PLT 466* 362 364 295 223   Cardiac Enzymes: No results for input(s): CKTOTAL, CKMB, CKMBINDEX, TROPONINI in the last 168 hours. BNP: BNP (last 3 results) No results for input(s): BNP in the last 8760 hours.  ProBNP (last 3 results) No results for input(s): PROBNP in the last 8760 hours.  CBG: No results for input(s): GLUCAP in the last 168 hours.     SignedFlorencia Reasons MD, PhD  [facility] 07/24/2015, 8:43 AM

## 2015-07-24 NOTE — Progress Notes (Signed)
Shane Cantu   DOB:Dec 13, 1955   UT#:[id number]   SFK#:[id number] I have seen the patient, examined him and edited the notes as follows  Subjective: Patient seen and examined. He feels better than prior day.Denies fevers, chills, night sweats, vision changes. Mucositis improving. Denies shortness of breath or cough. His mucous production is reduced. Denies any chest pain or palpitations. Denies lower extremity swelling. Denies nausea, heartburn or change in bowel habits. Appetite is decreased. Denies any dysuria. Denies abnormal skin rashes, or neuropathy. Denies any bleeding issues such as epistaxis, hematemesis, hematuria or hematochezia. Has not been ambulating due to lower leg pain after fracture, and overall weakness.  Scheduled Meds: . amoxicillin-clavulanate  1 tablet Oral Q12H  . aspirin EC  81 mg Oral Daily  . enoxaparin (LOVENOX) injection  40 mg Subcutaneous Q24H  . famotidine  20 mg Oral BID  . fluticasone  2 spray Each Nare Daily  . gabapentin  600 mg Oral 2 times per day  . guaiFENesin  600 mg Oral BID  . hydrocortisone cream   Topical TID  . lidocaine  15 mL Mouth/Throat Q6H  . methadone  20 mg Oral 3 times per day  . modafinil  200 mg Oral Daily  . pantoprazole  40 mg Oral BID  . polyethylene glycol  17 g Oral BID  . senna-docusate  1 tablet Oral BID  . traZODone  100 mg Oral QHS   Continuous Infusions: . 0.9 % NaCl with KCl 20 mEq / L 75 mL/hr at 07/24/15 0540   PRN Meds:.diphenhydrAMINE, hydrocerin, HYDROmorphone (DILAUDID) injection, ondansetron (ZOFRAN) IV, promethazine  Objective:  Filed Vitals:   07/24/15 0620  BP: 137/74  Pulse: 66  Temp: 97.7 F (36.5 C)  Resp: 18    Body mass index is 34.69 kg/(m^2).  Intake/Output Summary (Last 24 hours) at 07/24/15 0651 Last data filed at 07/24/15 1749  Gross per 24 hour  Intake    480 ml  Output   1200 ml  Net   -720 ml    GENERAL:alert, no distress and comfortable SKIN: skin color, texture, turgor are normal, no  rashes or significant lesions EYES: normal, conjunctiva are pink and non-injected, sclera clear OROPHARYNX: no exudate, no erythema and lips, buccal mucosa, and tongue normal.   NECK: supple, thyroid normal size, non-tender, without nodularity LYMPH: no palpable lymphadenopathy in the cervical, axillary or inguinal LUNGS: clear to auscultation and percussion with normal breathing effort HEART: regular rate & rhythm and no murmurs and no lower extremity edema ABDOMEN: soft, non-tender and normal bowel sounds Musculoskeletal:no cyanosis of digits and no clubbing. Tender at the right ankle area due to recent fracture PSYCH: alert & oriented x 3 with fluent speech NEURO: no focal motor/sensory deficits    CBG (last 3)  No results for input(s): GLUCAP in the last 72 hours.    Labs:   Recent Labs Lab 07/20/15 0939 07/21/15 0500 07/22/15 0620 07/23/15 0630 07/24/15 0500  WBC 5.2 2.8* 2.0* 2.1* 2.4*  HGB 12.4* 9.3* 9.6* 9.8* 9.2*  HCT 36.9* 29.3* 29.2* 30.2* 30.1*  PLT 466* 362 364 295 223  MCV 87.9 88.8 89.0 89.3 89.9  MCH 29.5 28.2 29.3 29.0 27.5  MCHC 33.6 31.7 32.9 32.5 30.6  RDW 15.6* 15.5 15.2 15.4 15.4  LYMPHSABS 0.3*  --   --   --   --   MONOABS 0.1  --   --   --   --   EOSABS 0.0  --   --   --   --  BASOSABS 0.0  --   --   --   --      Chemistries:    Recent Labs Lab 07/20/15 0939 07/21/15 0500 07/22/15 0620 07/23/15 0630 07/24/15 0500  NA 137 136 138 137 137  K 4.1 3.5 3.7 3.6 3.7  CL  --  100* 103 101 99*  CO2 27 30 29 30 31   GLUCOSE 157* 99 95 114* 95  BUN 23.9 20 13 12 13   CREATININE 1.3 1.02 0.91 0.92 0.91  CALCIUM 9.7 8.6* 9.0 8.8* 8.7*  MG 1.3* 1.6* 1.5* 1.4* 1.4*  AST 21  --   --   --   --   ALT 20  --   --   --   --   ALKPHOS 126  --   --   --   --   BILITOT 0.57  --   --   --   --      Basic Metabolic Panel:  Recent Labs Lab 07/20/15 0939  07/21/15 0500 07/22/15 0620 07/23/15 0630 07/24/15 0500  NA 137  --  136 138 137 137  K  4.1  < > 3.5 3.7 3.6 3.7  CL  --   --  100* 103 101 99*  CO2 27  --  30 29 30 31   GLUCOSE 157*  --  99 95 114* 95  BUN 23.9  --  20 13 12 13   CREATININE 1.3  --  1.02 0.91 0.92 0.91  CALCIUM 9.7  --  8.6* 9.0 8.8* 8.7*  MG 1.3*  --  1.6* 1.5* 1.4* 1.4*  < > = values in this interval not displayed.  GFR Estimated Creatinine Clearance: 102.6 mL/min (by C-G formula based on Cr of 0.91). Liver Function Tests:  Recent Labs Lab 07/20/15 0939  AST 21  ALT 20  ALKPHOS 126  BILITOT 0.57  PROT 7.3  ALBUMIN 3.5    CBC:  Recent Labs Lab 07/20/15 0939 07/21/15 0500 07/22/15 0620 07/23/15 0630 07/24/15 0500  WBC 5.2 2.8* 2.0* 2.1* 2.4*  NEUTROABS 4.7  --   --   --   --   HGB 12.4* 9.3* 9.6* 9.8* 9.2*  HCT 36.9* 29.3* 29.2* 30.2* 30.1*  MCV 87.9 88.8 89.0 89.3 89.9  PLT 466* 362 364 295 223     Microbiology Negative to date  Studies:  Dg Chest 2 View  07/22/2015   CLINICAL DATA:  Patient with cough, congestion and sore throat.  EXAM: CHEST  2 VIEW  COMPARISON:  Chest radiograph 06/04/2015  FINDINGS: Anterior chest wall Port-A-Cath is stable in position with tip projecting over the superior vena cava. Stable cardiac and mediastinal contours. No consolidative pulmonary opacities. No pleural effusion or pneumothorax. Mid thoracic spine degenerative changes.  IMPRESSION: No acute cardiopulmonary process.   Electronically Signed   By: Lovey Newcomer M.D.   On: 07/22/2015 15:07   Dg Ankle Complete Right  07/23/2015   CLINICAL DATA:  Right ankle fracture.  EXAM: RIGHT ANKLE - COMPLETE 3+ VIEW  COMPARISON:  06/04/2015  FINDINGS: Oblique fracture of the lateral malleolus, Weber B morphology. Medial mortise remains widened at 7 mm, indicating deltoid ligament tear. There is some early healing response along the lateral malleolar fracture without union. I do not observe a posterior malleolar component of the fracture.  Plantar and Achilles calcaneal spurs. No other fractures identified.   IMPRESSION: 1. Oblique lateral malleolar fracture compatible with Weber B injury (supination -external rotation). Abnormally widened medial mortise indicating  deltoid ligament tear. This fracture is typically considered unstable.   Electronically Signed   By: Van Clines M.D.   On: 07/23/2015 13:46   Assessment / Plan:   Tongue cancer Treatment was placed on hold until he recovers from recent side effects of treatment, last on 07/13/15 He was admitted on 8/19 due to uncontrollable nausea, unable able to have any oral intake at all. He was admitted to the hospital for IV fluids, IV anti-emetics and IV pain medicine. He was not able to take oral breakthrough pain medicine. Symptoms are now better controlled Cetuximab was cancelled this week but will resume next week. He has a PET scan scheduled for 9/1 with appointment at the [facility] on [facility]/2 We'll continue aggressive supportive care  Acute on Chronic neck pain, improving He has chronic back pain and recent mucositis pain. He was admitted for IV pain medicine for better pain control, improving Recommend transition him back to oral pain medications upon discharge  Nausea, resolved In the setting of recent chemotherapy and increased mucous production On presentation he was dehydrated from uncontrolled nausea. As mentioned above he was admitted for IV anti-emetics and IV fluids with improvement of symptoms Will schedule some IV fluids appointment throughout the rest of the week and next week  Hypomagnesemia He has low magnesium likely related to recent side effects of treatment. He received IV fluids and IV magnesium replacement with some response Current Mg levels are 1.4 from 1.5 prior day He may need oral magnesium replacement therapy upon discharge  Anemia in neoplastic disease Leukopenia In the setting of recent chemotherapy No bleeding issues are reported at this time Current WBC is 2.4 (2.1 prior day) No interventions  are recommended Will observe.  DVT prophylaxis On Lovenox  Malnutrition Recommend Nutrition evaluation if patient remains in hospital  Deconditioning Awaiting PT/OT evaluation  History of right ankle fracture, non displaced Appreciate Orthopedics involvement xrays show signs of healing and maintained alignment. At this point, he may ambulate in fracture boot and work with PT. He is to see Ortho as outpatient in 2 weeks after discharge  Full Code  Discharge planning Will set up appointment for IV fluid support for the rest of the week and see him back next week.  Hopefully DC today Other medical issues as per admitting team   Will sign off  Elease Hashimoto 07/24/2015  6:51 AM Medical Oncology and Hematology [place], [place], MD 07/24/2015

## 2015-07-24 NOTE — Care Management Note (Signed)
Case Management Note  Patient Details  Name: Shane Cantu MRN: [id number] Date of Birth: [birth year]  Subjective/Objective:          59 yo admitted with cancer related pain          Action/Plan: From home alone. Daughter has come to stay with him from [place].  Expected Discharge Date:   (unknown)               Expected Discharge Plan:  Home/Self Care  In-House Referral:     Discharge planning Services  CM Consult  Post Acute Care Choice:    Choice offered to:     DME Arranged:    DME Agency:     HH Arranged:    [place] Agency:     Status of Service:  Completed, signed off  Medicare Important Message Given:  Yes-second notification given Date Medicare IM Given:    Medicare IM give by:    Date Additional Medicare IM Given:    Additional Medicare Important Message give by:     If discussed at [place] of Stay Meetings, dates discussed:    Additional Comments: PT recommended no home PT follow up or equipment. No other DC needs noted. Lynnell Catalan, RN 07/24/2015, 2:06 PM

## 2015-07-24 NOTE — Progress Notes (Signed)
Patient is in the hospital so could not complete nutrition followup.

## 2015-07-24 NOTE — Evaluation (Signed)
Physical Therapy Evaluation Patient Details Name: Shane Cantu MRN: [id number] DOB: 08/31/1956 Today's Date: 07/24/2015   History of Present Illness  Shane Cantu is a 59 y.o. male adm with 07/20/15 with pain, hx of head and neck CA; also with  recent ankle fx--now allowed WBAT per Dr. Frankey Shown;   Clinical Impression  Patient evaluated by Physical Therapy with no further acute PT needs identified. All education has been completed and the patient has no further questions.  See below for any follow-up Physical Therapy or equipment needs. PT is signing off. Thank you for this referral.  Adjusted  Pt camboot, replaced straps, etc so it is fully functional for pt to use at home;         Follow Up Recommendations No PT follow up    Equipment Recommendations  None recommended by PT    Recommendations for Other Services       Precautions / Restrictions Precautions Precautions: Fall Required Braces or Orthoses: Other Brace/Splint Other Brace/Splint: camboot R LE Restrictions Other Position/Activity Restrictions: WBAT      Mobility  Bed Mobility Overal bed mobility: Needs Assistance Bed Mobility: Supine to Sit     Supine to sit: Modified independent (Device/Increase time)        Transfers Overall transfer level: Modified independent Equipment used: None;Rolling walker (2 wheeled)                Ambulation/Gait Ambulation/Gait assistance: Supervision;Modified independent (Device/Increase time) Ambulation Distance (Feet): 100 Feet Assistive device: Rolling walker (2 wheeled) Gait Pattern/deviations: Step-to pattern     General Gait Details: cues for safe use of RW initially  Stairs            Wheelchair Mobility    Modified Rankin (Stroke Patients Only)       Balance                                             Pertinent Vitals/Pain Pain Assessment: No/denies pain    Home Living Family/patient expects to be discharged to::  Private residence Living Arrangements: Alone (dtr from [place] staying )           Home Layout: One level Home Equipment: Environmental consultant - 2 wheels      Prior Function Level of Independence: Independent               Hand Dominance        Extremity/Trunk Assessment   Upper Extremity Assessment: Overall WFL for tasks assessed           Lower Extremity Assessment: RLE deficits/detail RLE Deficits / Details: knee and hip AROM grossly WFL       Communication   Communication: No difficulties  Cognition Arousal/Alertness: Awake/alert Behavior During Therapy: WFL for tasks assessed/performed Overall Cognitive Status: Within Functional Limits for tasks assessed                      General Comments      Exercises        Assessment/Plan    PT Assessment Patent does not need any further PT services  PT Diagnosis Difficulty walking   PT Problem List    PT Treatment Interventions     PT Goals (Current goals can be found in the Care Plan section) Acute Rehab PT Goals Patient Stated Goal: home PT Goal Formulation:  All assessment and education complete, DC therapy    Frequency     Barriers to discharge        Co-evaluation               End of Session   Activity Tolerance: Patient tolerated treatment well Patient left: in bed;with call bell/phone within reach Nurse Communication: Mobility status         Time: 3494-9447 PT Time Calculation (min) (ACUTE ONLY): 24 min   Charges:   PT Evaluation $Initial PT Evaluation Tier I: 1 Procedure PT Treatments $Gait Training: 8-22 mins $Self Care/Home Management: 8-22   PT G Codes:        Shane Cantu July 27, 2015, 1:58 PM

## 2015-07-25 ENCOUNTER — Ambulatory Visit (HOSPITAL_BASED_OUTPATIENT_CLINIC_OR_DEPARTMENT_OTHER): Payer: PPO

## 2015-07-25 VITALS — BP 128/80 | HR 92 | Temp 98.6°F | Resp 16

## 2015-07-25 DIAGNOSIS — C029 Malignant neoplasm of tongue, unspecified: Secondary | ICD-10-CM

## 2015-07-25 DIAGNOSIS — C01 Malignant neoplasm of base of tongue: Secondary | ICD-10-CM

## 2015-07-25 MED ORDER — HEPARIN SOD (PORK) LOCK FLUSH 100 UNIT/ML IV SOLN
500.0000 [IU] | Freq: Once | INTRAVENOUS | Status: AC | PRN
  Administered 2015-07-25: 500 [IU]
  Filled 2015-07-25: qty 5

## 2015-07-25 MED ORDER — HYDROMORPHONE HCL 4 MG/ML IJ SOLN
INTRAMUSCULAR | Status: AC
  Filled 2015-07-25: qty 1

## 2015-07-25 MED ORDER — SODIUM CHLORIDE 0.9 % IV SOLN
Freq: Once | INTRAVENOUS | Status: AC
  Administered 2015-07-25: 15:00:00 via INTRAVENOUS
  Filled 2015-07-25: qty 4

## 2015-07-25 MED ORDER — HYDROMORPHONE HCL 1 MG/ML IJ SOLN
2.0000 mg | INTRAMUSCULAR | Status: DC | PRN
  Administered 2015-07-25: 2 mg via INTRAVENOUS
  Filled 2015-07-25: qty 2

## 2015-07-25 MED ORDER — SODIUM CHLORIDE 0.9 % IJ SOLN
10.0000 mL | INTRAMUSCULAR | Status: DC | PRN
  Administered 2015-07-25: 10 mL
  Filled 2015-07-25: qty 10

## 2015-07-25 MED ORDER — SODIUM CHLORIDE 0.9 % IV SOLN
Freq: Once | INTRAVENOUS | Status: AC
  Administered 2015-07-25: 15:00:00 via INTRAVENOUS

## 2015-07-25 NOTE — Progress Notes (Signed)
Pt rated pain  # 7.5 before dilaudid & @ 1 hour later pain was # 3.

## 2015-07-25 NOTE — Patient Instructions (Signed)
Dehydration, Adult Dehydration is when you lose more fluids from the body than you take in. Vital organs like the kidneys, brain, and heart cannot function without a proper amount of fluids and salt. Any loss of fluids from the body can cause dehydration.  CAUSES   Vomiting.  Diarrhea.  Excessive sweating.  Excessive urine output.  Fever. SYMPTOMS  Mild dehydration  Thirst.  Dry lips.  Slightly dry mouth. Moderate dehydration  Very dry mouth.  Sunken eyes.  Skin does not bounce back quickly when lightly pinched and released.  Dark urine and decreased urine production.  Decreased tear production.  Headache. Severe dehydration  Very dry mouth.  Extreme thirst.  Rapid, weak pulse (more than 100 beats per minute at rest).  Cold hands and feet.  Not able to sweat in spite of heat and temperature.  Rapid breathing.  Blue lips.  Confusion and lethargy.  Difficulty being awakened.  Minimal urine production.  No tears. DIAGNOSIS  Your caregiver will diagnose dehydration based on your symptoms and your exam. Blood and urine tests will help confirm the diagnosis. The diagnostic evaluation should also identify the cause of dehydration. TREATMENT  Treatment of mild or moderate dehydration can often be done at home by increasing the amount of fluids that you drink. It is best to drink small amounts of fluid more often. Drinking too much at one time can make vomiting worse. Refer to the home care instructions below. Severe dehydration needs to be treated at the hospital where you will probably be given intravenous (IV) fluids that contain water and electrolytes. HOME CARE INSTRUCTIONS   Ask your caregiver about specific rehydration instructions.  Drink enough fluids to keep your urine clear or pale yellow.  Drink small amounts frequently if you have nausea and vomiting.  Eat as you normally do.  Avoid:  Foods or drinks high in sugar.  Carbonated  drinks.  Juice.  Extremely hot or cold fluids.  Drinks with caffeine.  Fatty, greasy foods.  Alcohol.  Tobacco.  Overeating.  Gelatin desserts.  Wash your hands well to avoid spreading bacteria and viruses.  Only take over-the-counter or prescription medicines for pain, discomfort, or fever as directed by your caregiver.  Ask your caregiver if you should continue all prescribed and over-the-counter medicines.  Keep all follow-up appointments with your caregiver. SEEK MEDICAL CARE IF:  You have abdominal pain and it increases or stays in one area (localizes).  You have a rash, stiff neck, or severe headache.  You are irritable, sleepy, or difficult to awaken.  You are weak, dizzy, or extremely thirsty. SEEK IMMEDIATE MEDICAL CARE IF:   You are unable to keep fluids down or you get worse despite treatment.  You have frequent episodes of vomiting or diarrhea.  You have blood or green matter (bile) in your vomit.  You have blood in your stool or your stool looks black and tarry.  You have not urinated in 6 to 8 hours, or you have only urinated a small amount of very dark urine.  You have a fever.  You faint. MAKE SURE YOU:   Understand these instructions.  Will watch your condition.  Will get help right away if you are not doing well or get worse. Document Released: 11/17/2005 Document Revised: 02/09/2012 Document Reviewed: 07/07/2011 ExitCare Patient Information 2015 [employer]. This information is not intended to replace advice given to you by your health care provider. Make sure you discuss any questions you have with your health care   provider.  

## 2015-07-26 ENCOUNTER — Other Ambulatory Visit: Payer: Self-pay | Admitting: Hematology and Oncology

## 2015-07-26 ENCOUNTER — Ambulatory Visit (HOSPITAL_BASED_OUTPATIENT_CLINIC_OR_DEPARTMENT_OTHER): Payer: PPO

## 2015-07-26 ENCOUNTER — Encounter: Payer: Self-pay | Admitting: Hematology and Oncology

## 2015-07-26 VITALS — BP 142/88 | HR 93 | Temp 97.7°F | Resp 18

## 2015-07-26 DIAGNOSIS — H109 Unspecified conjunctivitis: Secondary | ICD-10-CM

## 2015-07-26 DIAGNOSIS — C029 Malignant neoplasm of tongue, unspecified: Secondary | ICD-10-CM

## 2015-07-26 DIAGNOSIS — C01 Malignant neoplasm of base of tongue: Secondary | ICD-10-CM

## 2015-07-26 HISTORY — DX: Unspecified conjunctivitis: H10.9

## 2015-07-26 LAB — CULTURE, RESPIRATORY W GRAM STAIN: Gram Stain: NONE SEEN

## 2015-07-26 MED ORDER — HYDROMORPHONE HCL 4 MG/ML IJ SOLN
2.0000 mg | INTRAMUSCULAR | Status: DC | PRN
  Administered 2015-07-26: 2 mg via INTRAVENOUS

## 2015-07-26 MED ORDER — SODIUM CHLORIDE 0.9 % IJ SOLN
10.0000 mL | INTRAMUSCULAR | Status: DC | PRN
Start: 2015-07-26 — End: 2015-07-26
  Administered 2015-07-26: 10 mL
  Filled 2015-07-26: qty 10

## 2015-07-26 MED ORDER — HYDROMORPHONE HCL 1 MG/ML IJ SOLN
2.0000 mg | INTRAMUSCULAR | Status: DC | PRN
  Filled 2015-07-26: qty 2

## 2015-07-26 MED ORDER — HEPARIN SOD (PORK) LOCK FLUSH 100 UNIT/ML IV SOLN
500.0000 [IU] | Freq: Once | INTRAVENOUS | Status: AC | PRN
  Administered 2015-07-26: 500 [IU]
  Filled 2015-07-26: qty 5

## 2015-07-26 MED ORDER — HYDROMORPHONE HCL 4 MG/ML IJ SOLN
INTRAMUSCULAR | Status: AC
  Filled 2015-07-26: qty 1

## 2015-07-26 MED ORDER — TOBRAMYCIN 0.3 % OP SOLN: 1.0000 [drp] | OPHTHALMIC | Status: DC

## 2015-07-26 MED ORDER — SODIUM CHLORIDE 0.9 % IV SOLN
Freq: Once | INTRAVENOUS | Status: AC
  Administered 2015-07-26: 15:00:00 via INTRAVENOUS

## 2015-07-26 MED ORDER — SODIUM CHLORIDE 0.9 % IV SOLN
Freq: Once | INTRAVENOUS | Status: AC
  Administered 2015-07-26: 15:00:00 via INTRAVENOUS
  Filled 2015-07-26: qty 4

## 2015-07-26 NOTE — Patient Instructions (Signed)
Dehydration, Adult Dehydration is when you lose more fluids from the body than you take in. Vital organs like the kidneys, brain, and heart cannot function without a proper amount of fluids and salt. Any loss of fluids from the body can cause dehydration.  CAUSES   Vomiting.  Diarrhea.  Excessive sweating.  Excessive urine output.  Fever. SYMPTOMS  Mild dehydration  Thirst.  Dry lips.  Slightly dry mouth. Moderate dehydration  Very dry mouth.  Sunken eyes.  Skin does not bounce back quickly when lightly pinched and released.  Dark urine and decreased urine production.  Decreased tear production.  Headache. Severe dehydration  Very dry mouth.  Extreme thirst.  Rapid, weak pulse (more than 100 beats per minute at rest).  Cold hands and feet.  Not able to sweat in spite of heat and temperature.  Rapid breathing.  Blue lips.  Confusion and lethargy.  Difficulty being awakened.  Minimal urine production.  No tears. DIAGNOSIS  Your caregiver will diagnose dehydration based on your symptoms and your exam. Blood and urine tests will help confirm the diagnosis. The diagnostic evaluation should also identify the cause of dehydration. TREATMENT  Treatment of mild or moderate dehydration can often be done at home by increasing the amount of fluids that you drink. It is best to drink small amounts of fluid more often. Drinking too much at one time can make vomiting worse. Refer to the home care instructions below. Severe dehydration needs to be treated at the hospital where you will probably be given intravenous (IV) fluids that contain water and electrolytes. HOME CARE INSTRUCTIONS   Ask your caregiver about specific rehydration instructions.  Drink enough fluids to keep your urine clear or pale yellow.  Drink small amounts frequently if you have nausea and vomiting.  Eat as you normally do.  Avoid:  Foods or drinks high in sugar.  Carbonated  drinks.  Juice.  Extremely hot or cold fluids.  Drinks with caffeine.  Fatty, greasy foods.  Alcohol.  Tobacco.  Overeating.  Gelatin desserts.  Wash your hands well to avoid spreading bacteria and viruses.  Only take over-the-counter or prescription medicines for pain, discomfort, or fever as directed by your caregiver.  Ask your caregiver if you should continue all prescribed and over-the-counter medicines.  Keep all follow-up appointments with your caregiver. SEEK MEDICAL CARE IF:  You have abdominal pain and it increases or stays in one area (localizes).  You have a rash, stiff neck, or severe headache.  You are irritable, sleepy, or difficult to awaken.  You are weak, dizzy, or extremely thirsty. SEEK IMMEDIATE MEDICAL CARE IF:   You are unable to keep fluids down or you get worse despite treatment.  You have frequent episodes of vomiting or diarrhea.  You have blood or green matter (bile) in your vomit.  You have blood in your stool or your stool looks black and tarry.  You have not urinated in 6 to 8 hours, or you have only urinated a small amount of very dark urine.  You have a fever.  You faint. MAKE SURE YOU:   Understand these instructions.  Will watch your condition.  Will get help right away if you are not doing well or get worse. Document Released: 11/17/2005 Document Revised: 02/09/2012 Document Reviewed: 07/07/2011 ExitCare Patient Information 2015 [employer]. This information is not intended to replace advice given to you by your health care provider. Make sure you discuss any questions you have with your health care   provider.  

## 2015-07-26 NOTE — Progress Notes (Signed)
Pt reports that after his 1st chemo treatment his left eye appeared black with sores. Now he c/o soreness of the eye with some bumps, states that his eye is hard to open in the morning due to "stuff in my eye". No drainage noted at this time, pt reports feeling as if he has "stuff" in his eye causing the soreness. Left eye appears pink. [name] aware and prescription ordered, [name] wants pt to call and set up appt with his eye doctor if no improvement noted by Monday. Pt aware and verbalizes understanding.    Pt to receive 2 liters of NS over 2 hours today.

## 2015-07-27 ENCOUNTER — Ambulatory Visit: Payer: Self-pay

## 2015-07-27 ENCOUNTER — Encounter: Payer: Self-pay | Admitting: Hematology and Oncology

## 2015-07-27 ENCOUNTER — Ambulatory Visit (HOSPITAL_BASED_OUTPATIENT_CLINIC_OR_DEPARTMENT_OTHER): Payer: PPO

## 2015-07-27 ENCOUNTER — Encounter: Payer: Self-pay | Admitting: Nutrition

## 2015-07-27 VITALS — BP 115/96 | HR 68 | Temp 97.5°F | Resp 20

## 2015-07-27 DIAGNOSIS — C029 Malignant neoplasm of tongue, unspecified: Secondary | ICD-10-CM

## 2015-07-27 DIAGNOSIS — C01 Malignant neoplasm of base of tongue: Secondary | ICD-10-CM

## 2015-07-27 MED ORDER — HYDROMORPHONE HCL 4 MG/ML IJ SOLN
2.0000 mg | INTRAMUSCULAR | Status: DC | PRN
  Administered 2015-07-27: 2 mg via INTRAVENOUS

## 2015-07-27 MED ORDER — HYDROMORPHONE HCL 4 MG/ML IJ SOLN
INTRAMUSCULAR | Status: AC
  Filled 2015-07-27: qty 1

## 2015-07-27 MED ORDER — SODIUM CHLORIDE 0.9 % IV SOLN
Freq: Once | INTRAVENOUS | Status: AC
  Administered 2015-07-27: 13:00:00 via INTRAVENOUS
  Filled 2015-07-27: qty 4

## 2015-07-27 MED ORDER — SODIUM CHLORIDE 0.9 % IJ SOLN
10.0000 mL | INTRAMUSCULAR | Status: DC | PRN
  Administered 2015-07-27: 10 mL
  Filled 2015-07-27: qty 10

## 2015-07-27 MED ORDER — HEPARIN SOD (PORK) LOCK FLUSH 100 UNIT/ML IV SOLN
500.0000 [IU] | Freq: Once | INTRAVENOUS | Status: AC | PRN
  Administered 2015-07-27: 500 [IU]
  Filled 2015-07-27: qty 5

## 2015-07-27 MED ORDER — SODIUM CHLORIDE 0.9 % IV SOLN
2000.0000 mL | Freq: Once | INTRAVENOUS | Status: AC
  Administered 2015-07-27: 2000 mL via INTRAVENOUS

## 2015-07-27 NOTE — Progress Notes (Signed)
Patient received 2000 ml of NS over 2 hours

## 2015-07-27 NOTE — Patient Instructions (Signed)

## 2015-07-30 ENCOUNTER — Ambulatory Visit (HOSPITAL_BASED_OUTPATIENT_CLINIC_OR_DEPARTMENT_OTHER): Payer: PPO

## 2015-07-30 ENCOUNTER — Ambulatory Visit: Payer: PPO | Admitting: Nutrition

## 2015-07-30 VITALS — BP 141/77 | HR 69 | Temp 97.0°F | Resp 18

## 2015-07-30 DIAGNOSIS — C01 Malignant neoplasm of base of tongue: Secondary | ICD-10-CM | POA: Diagnosis not present

## 2015-07-30 DIAGNOSIS — C029 Malignant neoplasm of tongue, unspecified: Secondary | ICD-10-CM

## 2015-07-30 MED ORDER — SODIUM CHLORIDE 0.9 % IV SOLN
Freq: Once | INTRAVENOUS | Status: AC
  Administered 2015-07-30: 09:00:00 via INTRAVENOUS
  Filled 2015-07-30: qty 4

## 2015-07-30 MED ORDER — SODIUM CHLORIDE 0.9 % IJ SOLN
10.0000 mL | INTRAMUSCULAR | Status: DC | PRN
  Administered 2015-07-30: 10 mL
  Filled 2015-07-30: qty 10

## 2015-07-30 MED ORDER — HEPARIN SOD (PORK) LOCK FLUSH 100 UNIT/ML IV SOLN
500.0000 [IU] | Freq: Once | INTRAVENOUS | Status: AC | PRN
  Administered 2015-07-30: 500 [IU]
  Filled 2015-07-30: qty 5

## 2015-07-30 MED ORDER — HYDROMORPHONE HCL 1 MG/ML IJ SOLN
2.0000 mg | INTRAMUSCULAR | Status: DC | PRN
  Administered 2015-07-30: 2 mg via INTRAVENOUS
  Filled 2015-07-30: qty 2

## 2015-07-30 MED ORDER — HYDROMORPHONE HCL 4 MG/ML IJ SOLN
INTRAMUSCULAR | Status: AC
  Filled 2015-07-30: qty 1

## 2015-07-30 MED ORDER — SODIUM CHLORIDE 0.9 % IV SOLN
Freq: Once | INTRAVENOUS | Status: AC
  Administered 2015-07-30: 09:00:00 via INTRAVENOUS

## 2015-07-30 NOTE — Progress Notes (Signed)
Nutrition follow up completed with patient during IVF. Patient reports pain and nausea improved. He continues to require additional fluids. States he cannot eat and drink around time of chemotherapy. Daughter is assisting patient with meals and snacks. Patient consumes 6 Boost daily along with smoothies and shakes. Weight improved and documented as 235 pounds on August 19, increased from 227 pounds August 12.  Nutrition Diagnosis:  Inadequate oral intake improved but continues  Intervention:  Educated patient to consume meals/snacks every 2 hours. Reviewed ways to increase fluids throughout the day. Provided additional coupons for boost. Agree with consuming 6 Boost daily. Questions answered and teach back method used.  Monitoring, Evaluation, Goals:  Patient will tolerate adequate calories and protein to minimize loss of lean body mass and improve hydration.  Next Visit: Will continue to follow as needed.  **Disclaimer: This note was dictated with voice recognition software. Similar sounding words can inadvertently be transcribed and this note may contain transcription errors which may not have been corrected upon publication of note.**

## 2015-07-30 NOTE — Patient Instructions (Signed)

## 2015-07-31 ENCOUNTER — Ambulatory Visit (HOSPITAL_BASED_OUTPATIENT_CLINIC_OR_DEPARTMENT_OTHER): Payer: PPO

## 2015-07-31 VITALS — BP 112/67 | HR 62 | Temp 97.7°F | Resp 20

## 2015-07-31 DIAGNOSIS — C01 Malignant neoplasm of base of tongue: Secondary | ICD-10-CM

## 2015-07-31 DIAGNOSIS — C029 Malignant neoplasm of tongue, unspecified: Secondary | ICD-10-CM

## 2015-07-31 MED ORDER — SODIUM CHLORIDE 0.9 % IV SOLN
Freq: Once | INTRAVENOUS | Status: AC
  Administered 2015-07-31: 09:00:00 via INTRAVENOUS

## 2015-07-31 MED ORDER — SODIUM CHLORIDE 0.9 % IJ SOLN
10.0000 mL | INTRAMUSCULAR | Status: DC | PRN
  Administered 2015-07-31: 10 mL
  Filled 2015-07-31: qty 10

## 2015-07-31 MED ORDER — HYDROMORPHONE HCL 4 MG/ML IJ SOLN
INTRAMUSCULAR | Status: AC
  Filled 2015-07-31: qty 1

## 2015-07-31 MED ORDER — HEPARIN SOD (PORK) LOCK FLUSH 100 UNIT/ML IV SOLN
500.0000 [IU] | Freq: Once | INTRAVENOUS | Status: AC | PRN
  Administered 2015-07-31: 500 [IU]
  Filled 2015-07-31: qty 5

## 2015-07-31 MED ORDER — SODIUM CHLORIDE 0.9 % IV SOLN
Freq: Once | INTRAVENOUS | Status: AC
  Administered 2015-07-31: 09:00:00 via INTRAVENOUS
  Filled 2015-07-31: qty 4

## 2015-07-31 MED ORDER — HYDROMORPHONE HCL 1 MG/ML IJ SOLN
2.0000 mg | INTRAMUSCULAR | Status: DC | PRN
  Administered 2015-07-31: 2 mg via INTRAVENOUS
  Filled 2015-07-31: qty 2

## 2015-07-31 NOTE — Patient Instructions (Signed)

## 2015-08-01 ENCOUNTER — Ambulatory Visit (HOSPITAL_BASED_OUTPATIENT_CLINIC_OR_DEPARTMENT_OTHER): Payer: PPO

## 2015-08-01 VITALS — BP 133/60 | HR 64 | Temp 98.0°F | Resp 18

## 2015-08-01 DIAGNOSIS — C029 Malignant neoplasm of tongue, unspecified: Secondary | ICD-10-CM

## 2015-08-01 DIAGNOSIS — C01 Malignant neoplasm of base of tongue: Secondary | ICD-10-CM

## 2015-08-01 MED ORDER — SODIUM CHLORIDE 0.9 % IV SOLN
Freq: Once | INTRAVENOUS | Status: AC
  Administered 2015-08-01: 09:00:00 via INTRAVENOUS
  Filled 2015-08-01: qty 4

## 2015-08-01 MED ORDER — SODIUM CHLORIDE 0.9 % IJ SOLN
10.0000 mL | INTRAMUSCULAR | Status: DC | PRN
  Administered 2015-08-01: 10 mL
  Filled 2015-08-01: qty 10

## 2015-08-01 MED ORDER — HYDROMORPHONE HCL PF 4 MG/ML IJ SOLN
2.0000 mg | INTRAMUSCULAR | Status: DC | PRN
  Filled 2015-08-01: qty 0.5

## 2015-08-01 MED ORDER — SODIUM CHLORIDE 0.9 % IV SOLN
2000.0000 mL | Freq: Once | INTRAVENOUS | Status: AC
  Administered 2015-08-01: 1000 mL via INTRAVENOUS
  Administered 2015-08-01: 2000 mL via INTRAVENOUS

## 2015-08-01 MED ORDER — HYDROMORPHONE HCL 4 MG/ML IJ SOLN
2.0000 mg | INTRAMUSCULAR | Status: DC | PRN
  Administered 2015-08-01: 2 mg via INTRAVENOUS

## 2015-08-01 MED ORDER — HYDROMORPHONE HCL 4 MG/ML IJ SOLN
INTRAMUSCULAR | Status: AC
  Filled 2015-08-01: qty 1

## 2015-08-01 MED ORDER — HEPARIN SOD (PORK) LOCK FLUSH 100 UNIT/ML IV SOLN
500.0000 [IU] | Freq: Once | INTRAVENOUS | Status: AC | PRN
  Administered 2015-08-01: 500 [IU]
  Filled 2015-08-01: qty 5

## 2015-08-01 NOTE — Patient Instructions (Signed)
Dehydration, Adult Dehydration is when you lose more fluids from the body than you take in. Vital organs like the kidneys, brain, and heart cannot function without a proper amount of fluids and salt. Any loss of fluids from the body can cause dehydration.  CAUSES   Vomiting.  Diarrhea.  Excessive sweating.  Excessive urine output.  Fever. SYMPTOMS  Mild dehydration  Thirst.  Dry lips.  Slightly dry mouth. Moderate dehydration  Very dry mouth.  Sunken eyes.  Skin does not bounce back quickly when lightly pinched and released.  Dark urine and decreased urine production.  Decreased tear production.  Headache. Severe dehydration  Very dry mouth.  Extreme thirst.  Rapid, weak pulse (more than 100 beats per minute at rest).  Cold hands and feet.  Not able to sweat in spite of heat and temperature.  Rapid breathing.  Blue lips.  Confusion and lethargy.  Difficulty being awakened.  Minimal urine production.  No tears. DIAGNOSIS  Your caregiver will diagnose dehydration based on your symptoms and your exam. Blood and urine tests will help confirm the diagnosis. The diagnostic evaluation should also identify the cause of dehydration. TREATMENT  Treatment of mild or moderate dehydration can often be done at home by increasing the amount of fluids that you drink. It is best to drink small amounts of fluid more often. Drinking too much at one time can make vomiting worse. Refer to the home care instructions below. Severe dehydration needs to be treated at the hospital where you will probably be given intravenous (IV) fluids that contain water and electrolytes. HOME CARE INSTRUCTIONS   Ask your caregiver about specific rehydration instructions.  Drink enough fluids to keep your urine clear or pale yellow.  Drink small amounts frequently if you have nausea and vomiting.  Eat as you normally do.  Avoid:  Foods or drinks high in sugar.  Carbonated  drinks.  Juice.  Extremely hot or cold fluids.  Drinks with caffeine.  Fatty, greasy foods.  Alcohol.  Tobacco.  Overeating.  Gelatin desserts.  Wash your hands well to avoid spreading bacteria and viruses.  Only take over-the-counter or prescription medicines for pain, discomfort, or fever as directed by your caregiver.  Ask your caregiver if you should continue all prescribed and over-the-counter medicines.  Keep all follow-up appointments with your caregiver. SEEK MEDICAL CARE IF:  You have abdominal pain and it increases or stays in one area (localizes).  You have a rash, stiff neck, or severe headache.  You are irritable, sleepy, or difficult to awaken.  You are weak, dizzy, or extremely thirsty. SEEK IMMEDIATE MEDICAL CARE IF:   You are unable to keep fluids down or you get worse despite treatment.  You have frequent episodes of vomiting or diarrhea.  You have blood or green matter (bile) in your vomit.  You have blood in your stool or your stool looks black and tarry.  You have not urinated in 6 to 8 hours, or you have only urinated a small amount of very dark urine.  You have a fever.  You faint. MAKE SURE YOU:   Understand these instructions.  Will watch your condition.  Will get help right away if you are not doing well or get worse. Document Released: 11/17/2005 Document Revised: 02/09/2012 Document Reviewed: 07/07/2011 ExitCare Patient Information 2015 [employer]. This information is not intended to replace advice given to you by your health care provider. Make sure you discuss any questions you have with your health care   provider.  

## 2015-08-02 ENCOUNTER — Encounter (HOSPITAL_COMMUNITY): Payer: Self-pay | Admitting: Dentistry

## 2015-08-02 ENCOUNTER — Ambulatory Visit (HOSPITAL_COMMUNITY): Payer: Self-pay | Admitting: Dentistry

## 2015-08-02 ENCOUNTER — Encounter (HOSPITAL_COMMUNITY): Payer: Self-pay

## 2015-08-02 ENCOUNTER — Ambulatory Visit (HOSPITAL_COMMUNITY)
Admission: RE | Admit: 2015-08-02 | Discharge: 2015-08-02 | Disposition: A | Discharge status: Home / Self Care | Payer: PPO | Source: Ambulatory Visit | Attending: Hematology and Oncology | Admitting: Hematology and Oncology

## 2015-08-02 ENCOUNTER — Ambulatory Visit (HOSPITAL_BASED_OUTPATIENT_CLINIC_OR_DEPARTMENT_OTHER): Payer: PPO

## 2015-08-02 VITALS — BP 121/75 | HR 91 | Temp 99.0°F

## 2015-08-02 VITALS — BP 126/72 | HR 70 | Temp 97.2°F | Resp 18

## 2015-08-02 DIAGNOSIS — K08109 Complete loss of teeth, unspecified cause, unspecified class: Secondary | ICD-10-CM

## 2015-08-02 DIAGNOSIS — M264 Malocclusion, unspecified: Secondary | ICD-10-CM

## 2015-08-02 DIAGNOSIS — C029 Malignant neoplasm of tongue, unspecified: Secondary | ICD-10-CM

## 2015-08-02 DIAGNOSIS — C01 Malignant neoplasm of base of tongue: Secondary | ICD-10-CM | POA: Diagnosis present

## 2015-08-02 DIAGNOSIS — Z923 Personal history of irradiation: Secondary | ICD-10-CM

## 2015-08-02 DIAGNOSIS — K0820 Unspecified atrophy of edentulous alveolar ridge: Secondary | ICD-10-CM

## 2015-08-02 DIAGNOSIS — Z463 Encounter for fitting and adjustment of dental prosthetic device: Secondary | ICD-10-CM

## 2015-08-02 DIAGNOSIS — R682 Dry mouth, unspecified: Secondary | ICD-10-CM

## 2015-08-02 DIAGNOSIS — K117 Disturbances of salivary secretion: Secondary | ICD-10-CM

## 2015-08-02 LAB — GLUCOSE, CAPILLARY: GLUCOSE-CAPILLARY: 108 mg/dL — AB (ref 65–99)

## 2015-08-02 MED ORDER — HYDROMORPHONE HCL 1 MG/ML IJ SOLN
2.0000 mg | INTRAMUSCULAR | Status: DC | PRN
  Administered 2015-08-02: 2 mg via INTRAVENOUS
  Filled 2015-08-02: qty 2

## 2015-08-02 MED ORDER — HEPARIN SOD (PORK) LOCK FLUSH 100 UNIT/ML IV SOLN
500.0000 [IU] | Freq: Once | INTRAVENOUS | Status: AC | PRN
  Administered 2015-08-02: 500 [IU]
  Filled 2015-08-02: qty 5

## 2015-08-02 MED ORDER — FLUDEOXYGLUCOSE F - 18 (FDG) INJECTION
12.1000 | Freq: Once | INTRAVENOUS | Status: DC | PRN
  Administered 2015-08-02: 12.1 via INTRAVENOUS
  Filled 2015-08-02: qty 12.1

## 2015-08-02 MED ORDER — HYDROMORPHONE HCL 4 MG/ML IJ SOLN
INTRAMUSCULAR | Status: AC
  Filled 2015-08-02: qty 1

## 2015-08-02 MED ORDER — SODIUM CHLORIDE 0.9 % IJ SOLN
10.0000 mL | INTRAMUSCULAR | Status: DC | PRN
  Administered 2015-08-02: 10 mL
  Filled 2015-08-02: qty 10

## 2015-08-02 MED ORDER — SODIUM CHLORIDE 0.9 % IV SOLN
Freq: Once | INTRAVENOUS | Status: AC
  Administered 2015-08-02: 15:00:00 via INTRAVENOUS

## 2015-08-02 MED ORDER — SODIUM CHLORIDE 0.9 % IV SOLN
Freq: Once | INTRAVENOUS | Status: AC
  Administered 2015-08-02: 16:00:00 via INTRAVENOUS
  Filled 2015-08-02: qty 4

## 2015-08-02 NOTE — Progress Notes (Signed)
08/02/2015  Patient Name:   Shane Cantu Date of Birth:   Aug 04, 1956 Medical Record Number: [id number]   BP 121/75 mmHg  Pulse 91  Temp(Src) 99 F (37.2 C) (Oral)  Aaron Mose Macleod presents for continued upper and lower denture fabrication.  Procedure:  Upper and lower denture wax tryin. The patient has significant malocclusion which required a bilateral posterior crossbite and minimal overbite/overjet. Patient accepts esthetics, phonetics, fit and function. Patient agrees to process "as is" in [place] [address]. Patient to RTC for  upper and lower denture insertion.  [name], DDS

## 2015-08-02 NOTE — Patient Instructions (Signed)

## 2015-08-02 NOTE — Patient Instructions (Signed)
Return to clinic as scheduled for insertion of upper and lower complete dentures. Dr. Yamen Castrogiovanni 

## 2015-08-03 ENCOUNTER — Other Ambulatory Visit: Payer: Self-pay | Admitting: Hematology and Oncology

## 2015-08-03 ENCOUNTER — Encounter: Payer: Self-pay | Admitting: Hematology and Oncology

## 2015-08-03 ENCOUNTER — Telehealth: Payer: Self-pay | Admitting: *Deleted

## 2015-08-03 ENCOUNTER — Ambulatory Visit: Payer: PPO

## 2015-08-03 ENCOUNTER — Ambulatory Visit (HOSPITAL_BASED_OUTPATIENT_CLINIC_OR_DEPARTMENT_OTHER): Payer: PPO | Admitting: Hematology and Oncology

## 2015-08-03 ENCOUNTER — Ambulatory Visit (HOSPITAL_BASED_OUTPATIENT_CLINIC_OR_DEPARTMENT_OTHER): Payer: PPO

## 2015-08-03 ENCOUNTER — Telehealth: Payer: Self-pay | Admitting: Hematology and Oncology

## 2015-08-03 ENCOUNTER — Encounter: Payer: Self-pay | Admitting: *Deleted

## 2015-08-03 ENCOUNTER — Other Ambulatory Visit (HOSPITAL_BASED_OUTPATIENT_CLINIC_OR_DEPARTMENT_OTHER): Payer: PPO

## 2015-08-03 VITALS — BP 102/68 | HR 110 | Temp 100.1°F | Resp 19 | Ht 69.0 in | Wt 226.7 lb

## 2015-08-03 DIAGNOSIS — C029 Malignant neoplasm of tongue, unspecified: Secondary | ICD-10-CM | POA: Diagnosis not present

## 2015-08-03 DIAGNOSIS — M542 Cervicalgia: Secondary | ICD-10-CM

## 2015-08-03 DIAGNOSIS — C01 Malignant neoplasm of base of tongue: Secondary | ICD-10-CM

## 2015-08-03 DIAGNOSIS — T451X5A Adverse effect of antineoplastic and immunosuppressive drugs, initial encounter: Secondary | ICD-10-CM

## 2015-08-03 DIAGNOSIS — D6481 Anemia due to antineoplastic chemotherapy: Secondary | ICD-10-CM | POA: Diagnosis not present

## 2015-08-03 DIAGNOSIS — R110 Nausea: Secondary | ICD-10-CM

## 2015-08-03 DIAGNOSIS — G8929 Other chronic pain: Secondary | ICD-10-CM | POA: Diagnosis not present

## 2015-08-03 DIAGNOSIS — Z95828 Presence of other vascular implants and grafts: Secondary | ICD-10-CM

## 2015-08-03 LAB — COMPREHENSIVE METABOLIC PANEL (CC13)
ALT: 15 U/L (ref 0–55)
ANION GAP: 9 meq/L (ref 3–11)
AST: 17 U/L (ref 5–34)
Albumin: 3.2 g/dL — ABNORMAL LOW (ref 3.5–5.0)
Alkaline Phosphatase: 110 U/L (ref 40–150)
BILIRUBIN TOTAL: 0.4 mg/dL (ref 0.20–1.20)
BUN: 10.4 mg/dL (ref 7.0–26.0)
CALCIUM: 9.3 mg/dL (ref 8.4–10.4)
CHLORIDE: 101 meq/L (ref 98–109)
CO2: 29 mEq/L (ref 22–29)
CREATININE: 1.2 mg/dL (ref 0.7–1.3)
EGFR: 67 mL/min/{1.73_m2} — AB (ref >90)
Glucose: 173 mg/dl — ABNORMAL HIGH (ref 70–140)
Potassium: 4.5 mEq/L (ref 3.5–5.1)
Sodium: 139 mEq/L (ref 136–145)
Total Protein: 7.1 g/dL (ref 6.4–8.3)

## 2015-08-03 LAB — CBC WITH DIFFERENTIAL/PLATELET
BASO%: 0.1 % (ref 0.0–2.0)
BASOS ABS: 0 10*3/uL (ref 0.0–0.1)
EOS ABS: 0 10*3/uL (ref 0.0–0.5)
EOS%: 0.1 % (ref 0.0–7.0)
HEMATOCRIT: 31.2 % — AB (ref 38.4–49.9)
HGB: 9.9 g/dL — ABNORMAL LOW (ref 13.0–17.1)
LYMPH#: 0.4 10*3/uL — AB (ref 0.9–3.3)
LYMPH%: 4 % — AB (ref 14.0–49.0)
MCH: 29.4 pg (ref 27.2–33.4)
MCHC: 31.7 g/dL — AB (ref 32.0–36.0)
MCV: 92.6 fL (ref 79.3–98.0)
MONO#: 0.4 10*3/uL (ref 0.1–0.9)
MONO%: 4.5 % (ref 0.0–14.0)
NEUT#: 7.9 10*3/uL — ABNORMAL HIGH (ref 1.5–6.5)
NEUT%: 91.3 % — AB (ref 39.0–75.0)
PLATELETS: 161 10*3/uL (ref 140–400)
RBC: 3.37 10*6/uL — AB (ref 4.20–5.82)
RDW: 17.4 % — ABNORMAL HIGH (ref 11.0–14.6)
WBC: 8.7 10*3/uL (ref 4.0–10.3)

## 2015-08-03 LAB — MAGNESIUM (CC13): MAGNESIUM: 1.6 mg/dL (ref 1.5–2.5)

## 2015-08-03 MED ORDER — METHADONE HCL 10 MG PO TABS: 30.0000 mg | ORAL_TABLET | Freq: Three times a day (TID) | ORAL | Status: DC

## 2015-08-03 MED ORDER — SODIUM CHLORIDE 0.9 % IV SOLN
Freq: Once | INTRAVENOUS | Status: AC
  Administered 2015-08-03: 13:00:00 via INTRAVENOUS

## 2015-08-03 MED ORDER — HYDROMORPHONE HCL 4 MG PO TABS: 4.0000 mg | ORAL_TABLET | ORAL | Status: DC | PRN

## 2015-08-03 MED ORDER — HYDROMORPHONE HCL 4 MG/ML IJ SOLN
2.0000 mg | INTRAMUSCULAR | Status: DC | PRN
Start: 2015-08-03 — End: 2015-08-03
  Administered 2015-08-03: 2 mg via INTRAVENOUS

## 2015-08-03 MED ORDER — SODIUM CHLORIDE 0.9 % IV SOLN
Freq: Once | INTRAVENOUS | Status: DC
  Filled 2015-08-03: qty 4

## 2015-08-03 MED ORDER — METOCLOPRAMIDE HCL 10 MG PO TABS: 10.0000 mg | ORAL_TABLET | Freq: Four times a day (QID) | ORAL | Status: DC

## 2015-08-03 MED ORDER — HYDROMORPHONE HCL 4 MG/ML IJ SOLN
INTRAMUSCULAR | Status: AC
  Filled 2015-08-03: qty 1

## 2015-08-03 MED ORDER — SODIUM CHLORIDE 0.9 % IJ SOLN
10.0000 mL | INTRAMUSCULAR | Status: DC | PRN
  Administered 2015-08-03: 10 mL
  Filled 2015-08-03: qty 10

## 2015-08-03 MED ORDER — METOCLOPRAMIDE HCL 5 MG/ML IJ SOLN
10.0000 mg | Freq: Once | INTRAMUSCULAR | Status: AC
Start: 2015-08-03 — End: 2015-08-03
  Administered 2015-08-03: 10 mg via INTRAVENOUS

## 2015-08-03 MED ORDER — HEPARIN SOD (PORK) LOCK FLUSH 100 UNIT/ML IV SOLN
500.0000 [IU] | Freq: Once | INTRAVENOUS | Status: AC | PRN
  Administered 2015-08-03: 500 [IU]
  Filled 2015-08-03: qty 5

## 2015-08-03 MED ORDER — SODIUM CHLORIDE 0.9 % IJ SOLN
10.0000 mL | INTRAMUSCULAR | Status: DC | PRN
  Administered 2015-08-03: 10 mL via INTRAVENOUS
  Filled 2015-08-03: qty 10

## 2015-08-03 NOTE — Assessment & Plan Note (Signed)
This is likely due to recent treatment. The patient denies recent history of bleeding such as epistaxis, hematuria or hematochezia. He is asymptomatic from the anemia. I will observe for now.  He does not require transfusion now. 

## 2015-08-03 NOTE — Patient Instructions (Signed)

## 2015-08-03 NOTE — Telephone Encounter (Signed)
Informed pt of Reglan escribed to [facility] by [name].  He verbalized understanding.  He also asks if [name] got the Radiologist report on PET scan yet?  Informed pt report is in, but I am not sure if it is any different than what [name] reviewed w/ him earlier today.  He asks if someone can let him know on Tuesday about the report.

## 2015-08-03 NOTE — Progress Notes (Signed)
Pt states the zofran makes his reflux worse. Discussed with pharmacy and [name]. Received order for IV Reglan 10 mg x 1.  1/2 [place] through IV fluids pt states his nausea and reflux symptoms much better.

## 2015-08-03 NOTE — Assessment & Plan Note (Signed)
PET scan is still pending. The patient tolerated treatment very poorly. We discussed options including dose adjustment to treatment and aggressive supportive IVF fluids to complete 3 more cycles of chemotherapy or cetuximab only or treatment break followed by immunotherapy in the future. The patient is undecided. He will receive IV fluid therapy today and I plan to see him back in 10 days for further discussion.

## 2015-08-03 NOTE — Progress Notes (Signed)
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   04/13/2015 Initial Biopsy Accession [id number]:  Lymph node, needle/core biopsy - SCC, p16 positive.   04/18/2015 Initial Diagnosis Tongue cancer   04/24/2015 Imaging PET CT showed tongue cancer, lung nodule and possible bone mets   04/26/2015 Surgery He had dental extractions   05/03/2015 Imaging CT neck with contrast: L base of tongue with regional adenopathy, metastatic disease C2, C3, T2 vertebra; posterior L fourth rib.   05/09/2015 Procedure Port-a-cath placed.   05/11/2015 - 05/23/2015 Radiation Therapy He received palliative radiation therapy   06/01/2015 -  Chemotherapy He started cycle 1 of carboplatin, 5-FU and cetuximab   06/04/2015 - 06/05/2015 Hospital Admission He was admitted to the hospital after syncopal episode and had right nondisplaced fibular fracture   07/20/2015 - 07/24/2015 Hospital Admission he was admitted to the hospital for weakness and uncontrolled nausea and dehydration   08/02/2015 Imaging PET CT scan showed positive response to treatment     INTERVAL HISTORY: Please see below for problem oriented charting. He returns for further follow-up. He continues to have persistent neck pain, and he rated his pain a 7.5 out of 10 despite being on a lot of pain medicine. His nausea has improved. He denies vomiting. No recent diarrhea. He feels generalized weakness  REVIEW OF SYSTEMS:   Constitutional: Denies fevers, chills or abnormal weight loss Eyes: Denies  blurriness of vision Ears, nose, mouth, throat, and face: Denies mucositis or sore throat Respiratory: Denies cough, dyspnea or wheezes Cardiovascular: Denies palpitation, chest discomfort or lower extremity swelling Skin: Denies abnormal skin rashes Lymphatics: Denies new lymphadenopathy or easy bruising Neurological:Denies numbness, tingling or new weaknesses Behavioral/Psych: Mood is stable, no new changes  All other systems were reviewed with the patient and are negative.  I have reviewed the past medical history, past surgical history, social history and family history with the patient and they are unchanged from previous note.  ALLERGIES:  is allergic to morphine and related.  MEDICATIONS:  Current Outpatient Prescriptions  Medication Sig Dispense Refill  . amLODipine (NORVASC) 5 MG tablet Take 5 mg by mouth daily.     . Armodafinil 250 MG tablet Take 250 mg by mouth daily.    Marland Kitchen aspirin EC 81 MG tablet Take 81 mg by mouth daily.    . clindamycin (CLINDAGEL) 1 % gel APPLY TO AFFECTED AREA(S) TWICE DAILY 60 g 0  . diphenhydrAMINE (BENADRYL) 25 mg capsule Take 25 mg by mouth every 6 (six) hours as needed for itching.    Marland Kitchen emollient (BIAFINE) cream Apply 1 application topically 3 (three) times daily as needed (pain).     . famotidine (PEPCID) 20 MG tablet Take 20 mg by mouth 2 (two) times daily.    . fluticasone (FLONASE) 50 MCG/ACT nasal spray Place 2 sprays into both nostrils daily. 16 g 0  . gabapentin (NEURONTIN) 100 MG capsule Take 600 mg by mouth 2 (two) times daily. Takes with supper and at bedtime    . guaiFENesin (MUCINEX) 600 MG 12 hr tablet  Take 1 tablet (600 mg total) by mouth 2 (two) times daily. 30 tablet 0  . hydrocortisone cream 0.5 % Apply topically 3 (three) times daily. 30 g 0  . HYDROmorphone (DILAUDID) 4 MG tablet Take 1 tablet (4 mg total) by mouth every 4 (four) hours as needed for severe pain. 90 tablet 0  . ibuprofen (ADVIL,MOTRIN) 200 MG tablet Take 600 mg by  mouth every 6 (six) hours as needed for mild pain or moderate pain.     Marland Kitchen lansoprazole (PREVACID) 30 MG capsule Take 30 mg by mouth daily at 12 noon.    . lidocaine (LIDODERM) 5 % Place 1 patch onto the skin daily. Remove & Discard patch within 12 hours or as directed by MD (Patient taking differently: Place 1 patch onto the skin daily as needed (pain). Remove & Discard patch within 12 hours or as directed by MD) 30 patch 0  . lidocaine (XYLOCAINE) 2 % solution Patient: Mix 1part 2% viscous lidocaine, 1part H20. Swish and/or swallow 79mL of this mixture, 74min before meals and at bedtime, up to QID (Patient taking differently: Use as directed 20 mLs in the mouth or throat every 6 (six) hours as needed. Patient: Mix 1part 2% viscous lidocaine, 1part H20. Swish and/or swallow 64mL of this mixture, 39min before meals and at bedtime, up to QID) 100 mL 4  . lidocaine-prilocaine (EMLA) cream Apply 1 application topically as needed. To Port a cath site one hour prior to needle stick 30 g 3  . Magnesium Oxide 400 MG CAPS Take 1 capsule (400 mg total) by mouth daily. 30 capsule 0  . methadone (DOLOPHINE) 10 MG tablet Take 3 tablets (30 mg total) by mouth every 8 (eight) hours. 90 tablet 0  . Misc. Devices (PUMP IN STYLE ADVANCED) MISC Inject 1 cartridge into the vein See admin instructions. Adrucil 9350 mg  2.32ml/hr. Runs for 96 hours    . ondansetron (ZOFRAN) 8 MG tablet Take 1 tablet (8 mg total) by mouth every 8 (eight) hours as needed for nausea. 30 tablet 3  . polyethylene glycol powder (GLYCOLAX/MIRALAX) powder Take 17 g by mouth 2 (two) times daily. 255 g 0  . promethazine (PHENERGAN) 25 MG tablet Take 1 tablet (25 mg total) by mouth every 6 (six) hours as needed for nausea. 30 tablet 3  . scopolamine (TRANSDERM-SCOP) 1 MG/3DAYS Place 1 patch (1.5 mg total) onto the skin every 3 (three) days. 10 patch 12  . sucralfate (CARAFATE) 1 G tablet Dissolve 1 tablet in 10 mL H20 and swallow up to QID as needed for  sore throat (Patient taking differently: Take 1 g by mouth 4 (four) times daily as needed (sore throat). Dissolve 1 tablet in 10 mL H20 and swallow up to QID as needed for sore throat) 40 tablet 3  . tobramycin (TOBREX) 0.3 % ophthalmic solution Place 1 drop into the left eye every 4 (four) hours. 5 mL 0  . traZODone (DESYREL) 100 MG tablet Take 100 mg by mouth at bedtime.     No current facility-administered medications for this visit.   Facility-Administered Medications Ordered in Other Visits  Medication Dose Route Frequency Provider Last Rate Last Dose  . fludeoxyglucose F - 18 (FDG) injection 12.1 milli Curie  12.1 milli Curie Intravenous Once PRN Medication Radiologist, MD   12.1 milli Curie at 08/02/15 1304    PHYSICAL EXAMINATION: ECOG PERFORMANCE STATUS: 2 - Symptomatic, <50% confined to bed  Filed Vitals:   08/03/15 1102  BP:  102/68  Pulse: 110  Temp: 100.1 F (37.8 C)  Resp: 19   Filed Weights   08/03/15 1102  Weight: 226 lb 11.2 oz (102.83 kg)    GENERAL:alert, no distress and comfortable SKIN: skin color, texture, turgor are normal, no rashes or significant lesions EYES: normal, Conjunctiva are pink and non-injected, sclera clear Musculoskeletal:no cyanosis of digits and no clubbing  NEURO: alert & oriented x 3 with fluent speech, no focal motor/sensory deficits  LABORATORY DATA:  I have reviewed the data as listed    Component Value Date/Time   NA 139 08/03/2015 1034   NA 137 07/24/2015 0500   K 4.5 08/03/2015 1034   K 3.7 07/24/2015 0500   CL 99* 07/24/2015 0500   CO2 29 08/03/2015 1034   CO2 31 07/24/2015 0500   GLUCOSE 173* 08/03/2015 1034   GLUCOSE 95 07/24/2015 0500   BUN 10.4 08/03/2015 1034   BUN 13 07/24/2015 0500   CREATININE 1.2 08/03/2015 1034   CREATININE 0.91 07/24/2015 0500   CALCIUM 9.3 08/03/2015 1034   CALCIUM 8.7* 07/24/2015 0500   PROT 7.1 08/03/2015 1034   PROT 7.9 11/20/2014 1115   ALBUMIN 3.2* 08/03/2015 1034   ALBUMIN 4.1  11/20/2014 1115   AST 17 08/03/2015 1034   AST 22 11/20/2014 1115   ALT 15 08/03/2015 1034   ALT 22 11/20/2014 1115   ALKPHOS 110 08/03/2015 1034   ALKPHOS 117 11/20/2014 1115   BILITOT 0.40 08/03/2015 1034   BILITOT 0.4 11/20/2014 1115   GFRNONAA >60 07/24/2015 0500   GFRAA >60 07/24/2015 0500    No results found for: SPEP, UPEP  Lab Results  Component Value Date   WBC 8.7 08/03/2015   NEUTROABS 7.9* 08/03/2015   HGB 9.9* 08/03/2015   HCT 31.2* 08/03/2015   MCV 92.6 08/03/2015   PLT 161 08/03/2015      Chemistry      Component Value Date/Time   NA 139 08/03/2015 1034   NA 137 07/24/2015 0500   K 4.5 08/03/2015 1034   K 3.7 07/24/2015 0500   CL 99* 07/24/2015 0500   CO2 29 08/03/2015 1034   CO2 31 07/24/2015 0500   BUN 10.4 08/03/2015 1034   BUN 13 07/24/2015 0500   CREATININE 1.2 08/03/2015 1034   CREATININE 0.91 07/24/2015 0500      Component Value Date/Time   CALCIUM 9.3 08/03/2015 1034   CALCIUM 8.7* 07/24/2015 0500   ALKPHOS 110 08/03/2015 1034   ALKPHOS 117 11/20/2014 1115   AST 17 08/03/2015 1034   AST 22 11/20/2014 1115   ALT 15 08/03/2015 1034   ALT 22 11/20/2014 1115   BILITOT 0.40 08/03/2015 1034   BILITOT 0.4 11/20/2014 1115       RADIOGRAPHIC STUDIES:I reviewed the PET CT scan with him and family I have personally reviewed the radiological images as listed and agreed with the findings in the report.   ASSESSMENT & PLAN:  Tongue cancer PET scan is still pending. The patient tolerated treatment very poorly. We discussed options including dose adjustment to treatment and aggressive supportive IVF fluids to complete 3 more cycles of chemotherapy or cetuximab only or treatment break followed by immunotherapy in the future. The patient is undecided. He will receive IV fluid therapy today and I plan to see him back in 10 days for further discussion.  Anemia due to antineoplastic chemotherapy This is likely due to recent treatment. The patient  denies recent history of bleeding such as epistaxis, hematuria or  hematochezia. He is asymptomatic from the anemia. I will observe for now.  He does not require transfusion now.     Chronic neck pain His pain remains poorly controlled. I will increase methadone to 30 mg 3 times a day along with Dilaudid as needed for breakthrough pain   No orders of the defined types were placed in this encounter.   All questions were answered. The patient knows to call the clinic with any problems, questions or concerns. No barriers to learning was detected. I spent 25 minutes counseling the patient face to face. The total time spent in the appointment was 40 minutes and more than 50% was on counseling and review of test results     [facility], Lenny Fiumara, MD 08/03/2015 12:00 PM

## 2015-08-03 NOTE — Patient Instructions (Signed)
Dehydration, Adult Dehydration is when you lose more fluids from the body than you take in. Vital organs like the kidneys, brain, and heart cannot function without a proper amount of fluids and salt. Any loss of fluids from the body can cause dehydration.  CAUSES   Vomiting.  Diarrhea.  Excessive sweating.  Excessive urine output.  Fever. SYMPTOMS  Mild dehydration  Thirst.  Dry lips.  Slightly dry mouth. Moderate dehydration  Very dry mouth.  Sunken eyes.  Skin does not bounce back quickly when lightly pinched and released.  Dark urine and decreased urine production.  Decreased tear production.  Headache. Severe dehydration  Very dry mouth.  Extreme thirst.  Rapid, weak pulse (more than 100 beats per minute at rest).  Cold hands and feet.  Not able to sweat in spite of heat and temperature.  Rapid breathing.  Blue lips.  Confusion and lethargy.  Difficulty being awakened.  Minimal urine production.  No tears. DIAGNOSIS  Your caregiver will diagnose dehydration based on your symptoms and your exam. Blood and urine tests will help confirm the diagnosis. The diagnostic evaluation should also identify the cause of dehydration. TREATMENT  Treatment of mild or moderate dehydration can often be done at home by increasing the amount of fluids that you drink. It is best to drink small amounts of fluid more often. Drinking too much at one time can make vomiting worse. Refer to the home care instructions below. Severe dehydration needs to be treated at the hospital where you will probably be given intravenous (IV) fluids that contain water and electrolytes. HOME CARE INSTRUCTIONS   Ask your caregiver about specific rehydration instructions.  Drink enough fluids to keep your urine clear or pale yellow.  Drink small amounts frequently if you have nausea and vomiting.  Eat as you normally do.  Avoid:  Foods or drinks high in sugar.  Carbonated  drinks.  Juice.  Extremely hot or cold fluids.  Drinks with caffeine.  Fatty, greasy foods.  Alcohol.  Tobacco.  Overeating.  Gelatin desserts.  Wash your hands well to avoid spreading bacteria and viruses.  Only take over-the-counter or prescription medicines for pain, discomfort, or fever as directed by your caregiver.  Ask your caregiver if you should continue all prescribed and over-the-counter medicines.  Keep all follow-up appointments with your caregiver. SEEK MEDICAL CARE IF:  You have abdominal pain and it increases or stays in one area (localizes).  You have a rash, stiff neck, or severe headache.  You are irritable, sleepy, or difficult to awaken.  You are weak, dizzy, or extremely thirsty. SEEK IMMEDIATE MEDICAL CARE IF:   You are unable to keep fluids down or you get worse despite treatment.  You have frequent episodes of vomiting or diarrhea.  You have blood or green matter (bile) in your vomit.  You have blood in your stool or your stool looks black and tarry.  You have not urinated in 6 to 8 hours, or you have only urinated a small amount of very dark urine.  You have a fever.  You faint. MAKE SURE YOU:   Understand these instructions.  Will watch your condition.  Will get help right away if you are not doing well or get worse. Document Released: 11/17/2005 Document Revised: 02/09/2012 Document Reviewed: 07/07/2011 ExitCare Patient Information 2015 [employer]. This information is not intended to replace advice given to you by your health care provider. Make sure you discuss any questions you have with your health care   provider.  

## 2015-08-03 NOTE — Assessment & Plan Note (Signed)
His pain remains poorly controlled. I will increase methadone to 30 mg 3 times a day along with Dilaudid as needed for breakthrough pain

## 2015-08-03 NOTE — Telephone Encounter (Signed)
per pof to sch pt appt-gave pt copy of avs °

## 2015-08-04 ENCOUNTER — Emergency Department (HOSPITAL_COMMUNITY): Payer: PPO

## 2015-08-04 ENCOUNTER — Other Ambulatory Visit: Payer: Self-pay

## 2015-08-04 ENCOUNTER — Inpatient Hospital Stay (HOSPITAL_COMMUNITY)
Admission: EM | Admit: 2015-08-04 | Discharge: 2015-08-12 | DRG: 871 | Disposition: A | Discharge status: Home / Self Care | Payer: PPO | Attending: Internal Medicine | Admitting: Internal Medicine

## 2015-08-04 ENCOUNTER — Encounter (HOSPITAL_COMMUNITY): Payer: Self-pay | Admitting: Emergency Medicine

## 2015-08-04 DIAGNOSIS — R6520 Severe sepsis without septic shock: Secondary | ICD-10-CM | POA: Diagnosis present

## 2015-08-04 DIAGNOSIS — A4102 Sepsis due to Methicillin resistant Staphylococcus aureus: Secondary | ICD-10-CM | POA: Diagnosis present

## 2015-08-04 DIAGNOSIS — G8929 Other chronic pain: Secondary | ICD-10-CM | POA: Diagnosis present

## 2015-08-04 DIAGNOSIS — C029 Malignant neoplasm of tongue, unspecified: Secondary | ICD-10-CM | POA: Diagnosis present

## 2015-08-04 DIAGNOSIS — R0902 Hypoxemia: Secondary | ICD-10-CM

## 2015-08-04 DIAGNOSIS — R110 Nausea: Secondary | ICD-10-CM | POA: Diagnosis present

## 2015-08-04 DIAGNOSIS — Z85810 Personal history of malignant neoplasm of tongue: Secondary | ICD-10-CM | POA: Diagnosis not present

## 2015-08-04 DIAGNOSIS — I10 Essential (primary) hypertension: Secondary | ICD-10-CM | POA: Diagnosis not present

## 2015-08-04 DIAGNOSIS — S82891A Other fracture of right lower leg, initial encounter for closed fracture: Secondary | ICD-10-CM | POA: Diagnosis present

## 2015-08-04 DIAGNOSIS — C7951 Secondary malignant neoplasm of bone: Secondary | ICD-10-CM | POA: Diagnosis present

## 2015-08-04 DIAGNOSIS — T451X5A Adverse effect of antineoplastic and immunosuppressive drugs, initial encounter: Secondary | ICD-10-CM | POA: Diagnosis present

## 2015-08-04 DIAGNOSIS — E860 Dehydration: Secondary | ICD-10-CM | POA: Diagnosis not present

## 2015-08-04 DIAGNOSIS — J9601 Acute respiratory failure with hypoxia: Secondary | ICD-10-CM | POA: Diagnosis present

## 2015-08-04 DIAGNOSIS — M1990 Unspecified osteoarthritis, unspecified site: Secondary | ICD-10-CM | POA: Diagnosis present

## 2015-08-04 DIAGNOSIS — K219 Gastro-esophageal reflux disease without esophagitis: Secondary | ICD-10-CM | POA: Diagnosis present

## 2015-08-04 DIAGNOSIS — D630 Anemia in neoplastic disease: Secondary | ICD-10-CM | POA: Diagnosis present

## 2015-08-04 DIAGNOSIS — M542 Cervicalgia: Secondary | ICD-10-CM | POA: Diagnosis present

## 2015-08-04 DIAGNOSIS — R7989 Other specified abnormal findings of blood chemistry: Secondary | ICD-10-CM | POA: Diagnosis present

## 2015-08-04 DIAGNOSIS — T80212D Local infection due to central venous catheter, subsequent encounter: Secondary | ICD-10-CM | POA: Diagnosis not present

## 2015-08-04 DIAGNOSIS — M545 Low back pain: Secondary | ICD-10-CM | POA: Diagnosis not present

## 2015-08-04 DIAGNOSIS — R609 Edema, unspecified: Secondary | ICD-10-CM

## 2015-08-04 DIAGNOSIS — J189 Pneumonia, unspecified organism: Secondary | ICD-10-CM | POA: Diagnosis present

## 2015-08-04 DIAGNOSIS — B999 Unspecified infectious disease: Secondary | ICD-10-CM

## 2015-08-04 DIAGNOSIS — R0600 Dyspnea, unspecified: Secondary | ICD-10-CM | POA: Diagnosis present

## 2015-08-04 DIAGNOSIS — Z79899 Other long term (current) drug therapy: Secondary | ICD-10-CM

## 2015-08-04 DIAGNOSIS — I5032 Chronic diastolic (congestive) heart failure: Secondary | ICD-10-CM | POA: Diagnosis present

## 2015-08-04 DIAGNOSIS — R509 Fever, unspecified: Secondary | ICD-10-CM

## 2015-08-04 DIAGNOSIS — I4510 Unspecified right bundle-branch block: Secondary | ICD-10-CM | POA: Diagnosis present

## 2015-08-04 DIAGNOSIS — R739 Hyperglycemia, unspecified: Secondary | ICD-10-CM | POA: Diagnosis present

## 2015-08-04 DIAGNOSIS — Y848 Other medical procedures as the cause of abnormal reaction of the patient, or of later complication, without mention of misadventure at the time of the procedure: Secondary | ICD-10-CM | POA: Diagnosis present

## 2015-08-04 DIAGNOSIS — E876 Hypokalemia: Secondary | ICD-10-CM | POA: Diagnosis not present

## 2015-08-04 DIAGNOSIS — Y95 Nosocomial condition: Secondary | ICD-10-CM | POA: Diagnosis present

## 2015-08-04 DIAGNOSIS — Z9989 Dependence on other enabling machines and devices: Secondary | ICD-10-CM

## 2015-08-04 DIAGNOSIS — R7881 Bacteremia: Secondary | ICD-10-CM | POA: Diagnosis not present

## 2015-08-04 DIAGNOSIS — Z7982 Long term (current) use of aspirin: Secondary | ICD-10-CM

## 2015-08-04 DIAGNOSIS — J9602 Acute respiratory failure with hypercapnia: Secondary | ICD-10-CM | POA: Diagnosis present

## 2015-08-04 DIAGNOSIS — R7309 Other abnormal glucose: Secondary | ICD-10-CM | POA: Diagnosis not present

## 2015-08-04 DIAGNOSIS — T80212A Local infection due to central venous catheter, initial encounter: Secondary | ICD-10-CM | POA: Diagnosis not present

## 2015-08-04 DIAGNOSIS — G062 Extradural and subdural abscess, unspecified: Secondary | ICD-10-CM

## 2015-08-04 DIAGNOSIS — Y839 Surgical procedure, unspecified as the cause of abnormal reaction of the patient, or of later complication, without mention of misadventure at the time of the procedure: Secondary | ICD-10-CM | POA: Diagnosis not present

## 2015-08-04 DIAGNOSIS — A4101 Sepsis due to Methicillin susceptible Staphylococcus aureus: Secondary | ICD-10-CM | POA: Diagnosis not present

## 2015-08-04 DIAGNOSIS — B9562 Methicillin resistant Staphylococcus aureus infection as the cause of diseases classified elsewhere: Secondary | ICD-10-CM

## 2015-08-04 DIAGNOSIS — G4733 Obstructive sleep apnea (adult) (pediatric): Secondary | ICD-10-CM | POA: Diagnosis present

## 2015-08-04 DIAGNOSIS — R9431 Abnormal electrocardiogram [ECG] [EKG]: Secondary | ICD-10-CM | POA: Diagnosis present

## 2015-08-04 DIAGNOSIS — G629 Polyneuropathy, unspecified: Secondary | ICD-10-CM | POA: Diagnosis present

## 2015-08-04 DIAGNOSIS — E46 Unspecified protein-calorie malnutrition: Secondary | ICD-10-CM | POA: Diagnosis present

## 2015-08-04 DIAGNOSIS — Z113 Encounter for screening for infections with a predominantly sexual mode of transmission: Secondary | ICD-10-CM | POA: Diagnosis present

## 2015-08-04 DIAGNOSIS — T80219A Unspecified infection due to central venous catheter, initial encounter: Secondary | ICD-10-CM | POA: Diagnosis present

## 2015-08-04 DIAGNOSIS — C799 Secondary malignant neoplasm of unspecified site: Secondary | ICD-10-CM

## 2015-08-04 DIAGNOSIS — D6481 Anemia due to antineoplastic chemotherapy: Secondary | ICD-10-CM | POA: Diagnosis present

## 2015-08-04 DIAGNOSIS — E1165 Type 2 diabetes mellitus with hyperglycemia: Secondary | ICD-10-CM | POA: Diagnosis present

## 2015-08-04 DIAGNOSIS — R778 Other specified abnormalities of plasma proteins: Secondary | ICD-10-CM | POA: Diagnosis present

## 2015-08-04 DIAGNOSIS — D649 Anemia, unspecified: Secondary | ICD-10-CM

## 2015-08-04 DIAGNOSIS — M549 Dorsalgia, unspecified: Secondary | ICD-10-CM

## 2015-08-04 DIAGNOSIS — A419 Sepsis, unspecified organism: Secondary | ICD-10-CM | POA: Diagnosis present

## 2015-08-04 DIAGNOSIS — R0602 Shortness of breath: Secondary | ICD-10-CM | POA: Diagnosis present

## 2015-08-04 DIAGNOSIS — M25531 Pain in right wrist: Secondary | ICD-10-CM | POA: Diagnosis present

## 2015-08-04 DIAGNOSIS — C01 Malignant neoplasm of base of tongue: Secondary | ICD-10-CM | POA: Diagnosis present

## 2015-08-04 DIAGNOSIS — E119 Type 2 diabetes mellitus without complications: Secondary | ICD-10-CM | POA: Diagnosis present

## 2015-08-04 DIAGNOSIS — M5030 Other cervical disc degeneration, unspecified cervical region: Secondary | ICD-10-CM | POA: Diagnosis present

## 2015-08-04 DIAGNOSIS — D638 Anemia in other chronic diseases classified elsewhere: Secondary | ICD-10-CM | POA: Diagnosis present

## 2015-08-04 LAB — URINALYSIS, ROUTINE W REFLEX MICROSCOPIC
BILIRUBIN URINE: NEGATIVE
GLUCOSE, UA: NEGATIVE mg/dL
HGB URINE DIPSTICK: NEGATIVE
Ketones, ur: NEGATIVE mg/dL
Leukocytes, UA: NEGATIVE
Nitrite: NEGATIVE
PROTEIN: NEGATIVE mg/dL
Specific Gravity, Urine: 1.012 (ref 1.005–1.030)
UROBILINOGEN UA: 0.2 mg/dL (ref 0.0–1.0)
pH: 6 (ref 5.0–8.0)

## 2015-08-04 LAB — BASIC METABOLIC PANEL
ANION GAP: 3 — AB (ref 5–15)
BUN: 13 mg/dL (ref 6–20)
CALCIUM: 7.7 mg/dL — AB (ref 8.9–10.3)
CO2: 28 mmol/L (ref 22–32)
Chloride: 104 mmol/L (ref 101–111)
Creatinine, Ser: 1.21 mg/dL (ref 0.61–1.24)
GLUCOSE: 167 mg/dL — AB (ref 65–99)
POTASSIUM: 4.3 mmol/L (ref 3.5–5.1)
Sodium: 135 mmol/L (ref 135–145)

## 2015-08-04 LAB — CBC WITH DIFFERENTIAL/PLATELET
BASOS ABS: 0 10*3/uL (ref 0.0–0.1)
BASOS PCT: 0 % (ref 0–1)
Eosinophils Absolute: 0 10*3/uL (ref 0.0–0.7)
Eosinophils Relative: 0 % (ref 0–5)
HEMATOCRIT: 29.4 % — AB (ref 39.0–52.0)
HEMOGLOBIN: 9.5 g/dL — AB (ref 13.0–17.0)
LYMPHS PCT: 3 % — AB (ref 12–46)
Lymphs Abs: 0.3 10*3/uL — ABNORMAL LOW (ref 0.7–4.0)
MCH: 30.3 pg (ref 26.0–34.0)
MCHC: 32.3 g/dL (ref 30.0–36.0)
MCV: 93.6 fL (ref 78.0–100.0)
MONO ABS: 0.4 10*3/uL (ref 0.1–1.0)
Monocytes Relative: 4 % (ref 3–12)
NEUTROS ABS: 8.9 10*3/uL — AB (ref 1.7–7.7)
NEUTROS PCT: 93 % — AB (ref 43–77)
Platelets: 171 10*3/uL (ref 150–400)
RBC: 3.14 MIL/uL — AB (ref 4.22–5.81)
RDW: 17.6 % — AB (ref 11.5–15.5)
WBC: 9.5 10*3/uL (ref 4.0–10.5)

## 2015-08-04 LAB — I-STAT CG4 LACTIC ACID, ED
Lactic Acid, Venous: 1.34 mmol/L (ref 0.5–2.0)
Lactic Acid, Venous: 1.64 mmol/L (ref 0.5–2.0)

## 2015-08-04 LAB — TROPONIN I
TROPONIN I: 0.2 ng/mL — AB (ref <0.031)
TROPONIN I: 0.36 ng/mL — AB (ref <0.031)
TROPONIN I: 0.34 ng/mL — AB (ref <0.031)

## 2015-08-04 LAB — COMPREHENSIVE METABOLIC PANEL
ALBUMIN: 3.1 g/dL — AB (ref 3.5–5.0)
ALT: 14 U/L — ABNORMAL LOW (ref 17–63)
ANION GAP: 9 (ref 5–15)
AST: 21 U/L (ref 15–41)
Alkaline Phosphatase: 90 U/L (ref 38–126)
BILIRUBIN TOTAL: 0.6 mg/dL (ref 0.3–1.2)
BUN: 13 mg/dL (ref 6–20)
CALCIUM: 8.7 mg/dL — AB (ref 8.9–10.3)
CO2: 29 mmol/L (ref 22–32)
Chloride: 97 mmol/L — ABNORMAL LOW (ref 101–111)
Creatinine, Ser: 1.3 mg/dL — ABNORMAL HIGH (ref 0.61–1.24)
GFR calc non Af Amer: 58 mL/min — ABNORMAL LOW (ref >60)
GLUCOSE: 201 mg/dL — AB (ref 65–99)
POTASSIUM: 4.1 mmol/L (ref 3.5–5.1)
SODIUM: 135 mmol/L (ref 135–145)
TOTAL PROTEIN: 6.5 g/dL (ref 6.5–8.1)

## 2015-08-04 LAB — CBC
HCT: 23.9 % — ABNORMAL LOW (ref 39.0–52.0)
HEMATOCRIT: 25.1 % — AB (ref 39.0–52.0)
HEMOGLOBIN: 7.8 g/dL — AB (ref 13.0–17.0)
Hemoglobin: 7.6 g/dL — ABNORMAL LOW (ref 13.0–17.0)
MCH: 29.1 pg (ref 26.0–34.0)
MCH: 29.8 pg (ref 26.0–34.0)
MCHC: 31.1 g/dL (ref 30.0–36.0)
MCHC: 31.8 g/dL (ref 30.0–36.0)
MCV: 93.7 fL (ref 78.0–100.0)
MCV: 93.7 fL (ref 78.0–100.0)
PLATELETS: 165 10*3/uL (ref 150–400)
Platelets: 141 10*3/uL — ABNORMAL LOW (ref 150–400)
RBC: 2.68 MIL/uL — AB (ref 4.22–5.81)
RBC: 2.55 MIL/uL — AB (ref 4.22–5.81)
RDW: 17.7 % — ABNORMAL HIGH (ref 11.5–15.5)
RDW: 17.6 % — AB (ref 11.5–15.5)
WBC: 8.8 10*3/uL (ref 4.0–10.5)
WBC: 7.3 10*3/uL (ref 4.0–10.5)

## 2015-08-04 LAB — I-STAT TROPONIN, ED: TROPONIN I, POC: 0.19 ng/mL — AB (ref 0.00–0.08)

## 2015-08-04 LAB — GLUCOSE, CAPILLARY
GLUCOSE-CAPILLARY: 125 mg/dL — AB (ref 65–99)
Glucose-Capillary: 107 mg/dL — ABNORMAL HIGH (ref 65–99)
Glucose-Capillary: 101 mg/dL — ABNORMAL HIGH (ref 65–99)

## 2015-08-04 LAB — CORTISOL: CORTISOL PLASMA: 12.8 ug/dL

## 2015-08-04 LAB — STREP PNEUMONIAE URINARY ANTIGEN: STREP PNEUMO URINARY ANTIGEN: NEGATIVE

## 2015-08-04 LAB — PROCALCITONIN: PROCALCITONIN: 4.01 ng/mL

## 2015-08-04 LAB — LACTIC ACID, PLASMA: LACTIC ACID, VENOUS: 0.9 mmol/L (ref 0.5–2.0)

## 2015-08-04 LAB — MRSA PCR SCREENING: MRSA by PCR: POSITIVE — AB

## 2015-08-04 MED ORDER — PIPERACILLIN-TAZOBACTAM 3.375 G IVPB 30 MIN
3.3750 g | Freq: Once | INTRAVENOUS | Status: AC
  Administered 2015-08-04: 3.375 g via INTRAVENOUS
  Filled 2015-08-04: qty 50

## 2015-08-04 MED ORDER — CHLORHEXIDINE GLUCONATE 0.12 % MT SOLN
15.0000 mL | Freq: Two times a day (BID) | OROMUCOSAL | Status: DC
  Administered 2015-08-04 – 2015-08-05 (×2): 15 mL via OROMUCOSAL

## 2015-08-04 MED ORDER — ONDANSETRON HCL 4 MG PO TABS: 8.0000 mg | ORAL_TABLET | Freq: Three times a day (TID) | ORAL | Status: DC | PRN

## 2015-08-04 MED ORDER — GABAPENTIN 300 MG PO CAPS
600.0000 mg | ORAL_CAPSULE | Freq: Two times a day (BID) | ORAL | Status: DC
  Administered 2015-08-04 – 2015-08-05 (×3): 600 mg via ORAL
  Filled 2015-08-04 (×3): qty 2

## 2015-08-04 MED ORDER — VANCOMYCIN HCL IN DEXTROSE 1-5 GM/200ML-% IV SOLN
1000.0000 mg | Freq: Once | INTRAVENOUS | Status: DC
  Filled 2015-08-04: qty 200

## 2015-08-04 MED ORDER — MAGNESIUM OXIDE 400 (241.3 MG) MG PO TABS
400.0000 mg | ORAL_TABLET | Freq: Every day | ORAL | Status: DC
  Administered 2015-08-04 – 2015-08-05 (×2): 400 mg via ORAL
  Filled 2015-08-04 (×2): qty 1

## 2015-08-04 MED ORDER — SCOPOLAMINE 1 MG/3DAYS TD PT72
1.0000 | MEDICATED_PATCH | TRANSDERMAL | Status: DC
  Administered 2015-08-04: 1.5 mg via TRANSDERMAL
  Filled 2015-08-04: qty 1

## 2015-08-04 MED ORDER — FLUTICASONE PROPIONATE 50 MCG/ACT NA SUSP
2.0000 | Freq: Every day | NASAL | Status: DC
  Administered 2015-08-04 – 2015-08-05 (×2): 2 via NASAL
  Filled 2015-08-04: qty 16

## 2015-08-04 MED ORDER — CHLORHEXIDINE GLUCONATE CLOTH 2 % EX PADS
6.0000 | MEDICATED_PAD | Freq: Every day | CUTANEOUS | Status: AC
  Administered 2015-08-05 – 2015-08-09 (×2): 6 via TOPICAL

## 2015-08-04 MED ORDER — INSULIN ASPART 100 UNIT/ML ~~LOC~~ SOLN: 0.0000 [IU] | Freq: Every day | SUBCUTANEOUS | Status: DC

## 2015-08-04 MED ORDER — SODIUM CHLORIDE 0.9 % IV BOLUS (SEPSIS)
1000.0000 mL | INTRAVENOUS | Status: AC
  Administered 2015-08-04 (×2): 1000 mL via INTRAVENOUS

## 2015-08-04 MED ORDER — SODIUM CHLORIDE 0.9 % IV SOLN
INTRAVENOUS | Status: DC
  Administered 2015-08-04: 1000 mL via INTRAVENOUS
  Administered 2015-08-04: 08:00:00 via INTRAVENOUS

## 2015-08-04 MED ORDER — HYDROMORPHONE HCL 1 MG/ML IJ SOLN: 1.0000 mg | INTRAMUSCULAR | Status: DC | PRN

## 2015-08-04 MED ORDER — METHADONE HCL 10 MG PO TABS
30.0000 mg | ORAL_TABLET | Freq: Three times a day (TID) | ORAL | Status: DC
  Administered 2015-08-04: 30 mg via ORAL
  Filled 2015-08-04: qty 3

## 2015-08-04 MED ORDER — METOCLOPRAMIDE HCL 10 MG PO TABS
10.0000 mg | ORAL_TABLET | Freq: Four times a day (QID) | ORAL | Status: DC
  Administered 2015-08-04: 10 mg via ORAL
  Filled 2015-08-04: qty 1

## 2015-08-04 MED ORDER — VANCOMYCIN HCL IN DEXTROSE 750-5 MG/150ML-% IV SOLN
750.0000 mg | Freq: Two times a day (BID) | INTRAVENOUS | Status: DC
  Administered 2015-08-04 – 2015-08-05 (×3): 750 mg via INTRAVENOUS
  Filled 2015-08-04 (×4): qty 150

## 2015-08-04 MED ORDER — MAGNESIUM OXIDE 400 MG PO CAPS: 400.0000 mg | ORAL_CAPSULE | Freq: Every day | ORAL | Status: DC

## 2015-08-04 MED ORDER — FENTANYL 50 MCG/HR TD PT72
50.0000 ug | MEDICATED_PATCH | TRANSDERMAL | Status: DC
  Administered 2015-08-04: 50 ug via TRANSDERMAL
  Filled 2015-08-04: qty 1

## 2015-08-04 MED ORDER — TRAZODONE HCL 50 MG PO TABS: 100.0000 mg | ORAL_TABLET | Freq: Every day | ORAL | Status: DC

## 2015-08-04 MED ORDER — IOHEXOL 350 MG/ML SOLN
100.0000 mL | Freq: Once | INTRAVENOUS | Status: AC | PRN
  Administered 2015-08-04: 100 mL via INTRAVENOUS

## 2015-08-04 MED ORDER — ACETAMINOPHEN 500 MG PO TABS
1000.0000 mg | ORAL_TABLET | Freq: Once | ORAL | Status: AC
  Administered 2015-08-04: 1000 mg via ORAL
  Filled 2015-08-04: qty 2

## 2015-08-04 MED ORDER — HYDROMORPHONE HCL 2 MG PO TABS: 6.0000 mg | ORAL_TABLET | ORAL | Status: DC | PRN

## 2015-08-04 MED ORDER — VANCOMYCIN HCL 10 G IV SOLR
1500.0000 mg | Freq: Once | INTRAVENOUS | Status: AC
  Administered 2015-08-04: 1500 mg via INTRAVENOUS
  Filled 2015-08-04: qty 1500

## 2015-08-04 MED ORDER — ASPIRIN EC 81 MG PO TBEC
81.0000 mg | DELAYED_RELEASE_TABLET | Freq: Every day | ORAL | Status: DC
  Administered 2015-08-04 – 2015-08-12 (×8): 81 mg via ORAL
  Filled 2015-08-04 (×8): qty 1

## 2015-08-04 MED ORDER — TOBRAMYCIN 0.3 % OP SOLN
1.0000 [drp] | OPHTHALMIC | Status: DC
  Administered 2015-08-04 – 2015-08-12 (×49): 1 [drp] via OPHTHALMIC
  Filled 2015-08-04 (×2): qty 5

## 2015-08-04 MED ORDER — SODIUM CHLORIDE 0.9 % IV BOLUS (SEPSIS)
1000.0000 mL | Freq: Once | INTRAVENOUS | Status: AC
  Administered 2015-08-04: 1000 mL via INTRAVENOUS

## 2015-08-04 MED ORDER — METOCLOPRAMIDE HCL 10 MG PO TABS: 10.0000 mg | ORAL_TABLET | Freq: Three times a day (TID) | ORAL | Status: DC

## 2015-08-04 MED ORDER — HYDROMORPHONE HCL 2 MG PO TABS: 4.0000 mg | ORAL_TABLET | ORAL | Status: DC | PRN

## 2015-08-04 MED ORDER — PIPERACILLIN-TAZOBACTAM 3.375 G IVPB
3.3750 g | Freq: Three times a day (TID) | INTRAVENOUS | Status: DC
  Administered 2015-08-04 – 2015-08-05 (×3): 3.375 g via INTRAVENOUS
  Filled 2015-08-04 (×5): qty 50

## 2015-08-04 MED ORDER — PROMETHAZINE HCL 25 MG PO TABS: 25.0000 mg | ORAL_TABLET | Freq: Four times a day (QID) | ORAL | Status: DC | PRN

## 2015-08-04 MED ORDER — SODIUM CHLORIDE 0.9 % IV BOLUS (SEPSIS): 1000.0000 mL | Freq: Once | INTRAVENOUS | Status: AC

## 2015-08-04 MED ORDER — GUAIFENESIN ER 600 MG PO TB12
600.0000 mg | ORAL_TABLET | Freq: Two times a day (BID) | ORAL | Status: DC
  Administered 2015-08-04 – 2015-08-05 (×3): 600 mg via ORAL
  Filled 2015-08-04 (×3): qty 1

## 2015-08-04 MED ORDER — ENOXAPARIN SODIUM 40 MG/0.4ML ~~LOC~~ SOLN
40.0000 mg | SUBCUTANEOUS | Status: DC
  Administered 2015-08-04 – 2015-08-06 (×3): 40 mg via SUBCUTANEOUS
  Filled 2015-08-04 (×3): qty 0.4

## 2015-08-04 MED ORDER — MUPIROCIN 2 % EX OINT
1.0000 "application " | TOPICAL_OINTMENT | Freq: Two times a day (BID) | CUTANEOUS | Status: AC
  Administered 2015-08-04 – 2015-08-09 (×10): 1 via NASAL
  Filled 2015-08-04: qty 22

## 2015-08-04 MED ORDER — CETYLPYRIDINIUM CHLORIDE 0.05 % MT LIQD
7.0000 mL | Freq: Two times a day (BID) | OROMUCOSAL | Status: DC
  Administered 2015-08-05 (×2): 7 mL via OROMUCOSAL

## 2015-08-04 MED ORDER — INSULIN ASPART 100 UNIT/ML ~~LOC~~ SOLN: 0.0000 [IU] | Freq: Three times a day (TID) | SUBCUTANEOUS | Status: DC

## 2015-08-04 MED ORDER — ENSURE ENLIVE PO LIQD
237.0000 mL | Freq: Two times a day (BID) | ORAL | Status: DC
  Administered 2015-08-05: 237 mL via ORAL

## 2015-08-04 MED ORDER — SODIUM CHLORIDE 0.9 % IV BOLUS (SEPSIS)
500.0000 mL | INTRAVENOUS | Status: AC
  Administered 2015-08-04 (×2): 500 mL via INTRAVENOUS

## 2015-08-04 NOTE — Progress Notes (Signed)
ANTIBIOTIC CONSULT NOTE - INITIAL  Pharmacy Consult for Vancomycin and Zosyn  Indication: rule out sepsis  Allergies  Allergen Reactions  . Morphine And Related Anaphylaxis    Patient Measurements:    Vital Signs: Temp: 98.4 F (36.9 C) (09/03 0554) Temp Source: Oral (09/03 0554) BP: 90/65 mmHg (09/03 0700) Pulse Rate: 77 (09/03 0700) Intake/Output from previous day: 09/02 0701 - 09/03 0700 In: 1000 [I.V.:1000] Out: -  Intake/Output from this shift: Total I/O In: 1000 [I.V.:1000] Out: -   Labs:  Recent Labs  08/03/15 1034 08/04/15 0327  WBC 8.7 9.5  HGB 9.9* 9.5*  PLT 161 171  CREATININE 1.2 1.30*   Estimated Creatinine Clearance: 70.5 mL/min (by C-G formula based on Cr of 1.3). No results for input(s): VANCOTROUGH, VANCOPEAK, VANCORANDOM, GENTTROUGH, GENTPEAK, GENTRANDOM, TOBRATROUGH, TOBRAPEAK, TOBRARND, AMIKACINPEAK, AMIKACINTROU, AMIKACIN in the last 72 hours.   Microbiology: Recent Results (from the past 720 hour(s))  Culture, expectorated sputum-assessment     Status: None   Collection Time: 07/23/15  6:38 AM  Result Value Ref Range Status   Specimen Description SPUTUM  Final   Special Requests NONE  Final   Sputum evaluation   Final    THIS SPECIMEN IS ACCEPTABLE. RESPIRATORY CULTURE REPORT TO FOLLOW.   Report Status 07/23/2015 FINAL  Final  Culture, respiratory (NON-Expectorated)     Status: None   Collection Time: 07/23/15  6:38 AM  Result Value Ref Range Status   Specimen Description SPUTUM  Final   Special Requests NONE  Final   Gram Stain   Final    NO WBC SEEN FEW SQUAMOUS EPITHELIAL CELLS PRESENT FEW GRAM POSITIVE COCCI IN CLUSTERS Performed at [facility]    Culture   Final    MODERATE METHICILLIN RESISTANT STAPHYLOCOCCUS AUREUS Note: RIFAMPIN AND GENTAMICIN SHOULD NOT BE USED AS SINGLE DRUGS FOR TREATMENT OF STAPH INFECTIONS. This organism DOES NOT demonstrate inducible Clindamycin resistance in vitro. Performed at [facility]    Report Status 07/26/2015 FINAL  Final   Organism ID, Bacteria METHICILLIN RESISTANT STAPHYLOCOCCUS AUREUS  Final      Susceptibility   Methicillin resistant staphylococcus aureus - MIC*    CLINDAMYCIN <=0.25 SENSITIVE Sensitive     ERYTHROMYCIN >=8 RESISTANT Resistant     GENTAMICIN <=0.5 SENSITIVE Sensitive     LEVOFLOXACIN 4 INTERMEDIATE Intermediate     OXACILLIN >=4 RESISTANT Resistant     RIFAMPIN <=0.5 SENSITIVE Sensitive     TRIMETH/SULFA <=10 SENSITIVE Sensitive     VANCOMYCIN 1 SENSITIVE Sensitive     TETRACYCLINE <=1 SENSITIVE Sensitive     * MODERATE METHICILLIN RESISTANT STAPHYLOCOCCUS AUREUS    Medical History: Past Medical History  Diagnosis Date  . Chronic pain   . Back pain   . Chronic neck pain   . History of kidney stones   . Allergic rhinitis   . Hypertension   . Migraine headache without aura   . Sleep apnea     does not use cpap  . GERD (gastroesophageal reflux disease)   . Neuropathy   . Arthritis   . Cancer      may 2016 tongue cancer  . Bacterial conjunctivitis of left eye 07/26/2015    Medications:  Flonase  Mucinex  Dilaudid  Methadone  Magox  ReglaN  Miralax  Carafate  Norvasc  Armodfinil  ASA  Pepcid  Neurontin  Prevacid  Trazodone    Assessment: 59 y.o. male with SOB/fever/hypotension, recent respiratory culture 07/23/15 grew MRSA, for  empiric antibiotics.  Vancomycin 1500 mg IV given in ED at 0345   Goal of Therapy:  Vancomycin trough level 15-20 mcg/ml  Plan:  Vancomycin 750 mg IV q12h Zosyn 3.375 g IV q8h   Caryl Pina 08/04/2015,7:20 AM

## 2015-08-04 NOTE — ED Notes (Signed)
Pt in EMS from home, reporting SOB, CP, neck pain did have tremors. . Was initially disoriented and O2 at 88%. Got albuterol and duoneb en route. Lung sounds diminished. Pt does have tongue cancer

## 2015-08-04 NOTE — H&P (Signed)
[facility] History and Physical                                                                                    Shane Cantu, is a 59 y.o. male  MRN: [id number]   DOB - 04/30/1956  Admit Date - 08/04/2015  Outpatient Primary MD for the patient is [name], MD  Referring MD: Canary Brim / ER  With History of -  Past Medical History  Diagnosis Date  . Chronic pain   . Back pain   . Chronic neck pain   . History of kidney stones   . Allergic rhinitis   . Hypertension   . Migraine headache without aura   . Sleep apnea     does not use cpap  . GERD (gastroesophageal reflux disease)   . Neuropathy   . Arthritis   . Cancer      may 2016 tongue cancer  . Bacterial conjunctivitis of left eye 07/26/2015      Past Surgical History  Procedure Laterality Date  . Back surgery  03/1993, 10/2002    Dr. Louanne Skye  . Tonsillectomy      as a child  . Neck surgery  2004    Dr. Louanne Skye  . Lymph node biopsy    . Nasal sinusotomy  1977  . Colonoscopy    . Multiple extractions with alveoloplasty N/A 04/26/2015    Procedure: Extraction of tooth #'s 6,11,12,15,20,21,22,27,28 with alveoloplasty;  Surgeon: [name], DDS;  Location: [place] [facility];  Service: Oral Surgery;  Laterality: N/A;    in for   Chief Complaint  Patient presents with  . 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 He also has a nondisplaced right ankle fracture (occurred in July) which currently is stable with utilization of fracture boot. He was recently discharged on 8/23 after being hospitalized for intractable nausea and dehydration secondary to chemotherapy. He was seen by his oncologist on 9/2 with no significant issues other than pain management thus his methadone was increased to 30 mg 3 times a day and previous Dilaudid  continued. He apparently received IV fluids as well. Findings of his PET scan were reviewed with the patient at that time which according to the patient revealed improvement in his cancer. Patient reports that since the onset of chemotherapy he's had low-grade temperatures but nothing greater than 100.5. He presented to the ER after awakening during the night with shortness of breath and chest pressure. He did not have a cough. EMS was called and he was given nebulizer treatment with minimal improvement.  Upon arrival to the ER patient's oral temperature was 101.6, his heart rate was 131 bpm with underlying sinus tachycardia, blood pressure was 113/84, his room air saturations were apparently 66% and increased to 75% with application of a nonrebreather mask. He was subsequently weaned down to nasal cannula oxygen. Repeat temperature 1 hour later was 102F rectal, with nasal cannula oxygen - saturations were between 96% and  100%. Chest x-ray revealed a right basilar airspace opacity concerning for pneumonia. Because of abrupt onset of dyspnea without cough in a patient with underlying malignancy there was concern for pulmonary embolism so a CT angiogram of the chest was completed. The study was suboptimal noting the PowerPort was injected and leaked 2 attempts. The radiologist impression was of suboptimal contrast bolus timing without findings of pulmonary embolism to at least the segmental branches. There was also a finding of multifocal consolidation in the lungs compatible with pneumonia. While in the emergency department patient did had some transient hypotension, therefore EDP initiated sepsis protocol including a total of 3 L of normal saline fluid boluses. He was started on empiric antibiotics therapy for presumed HCAP and blood cultures were also obtained. Lactic acid was normal 2 collections. BUN was 13 with a slightly elevated creatinine of 1.3 with baseline being 1.9. Glucose was elevated at 201.  Point-of-care troponin was mildly elevated at 0.19 with repeat standard troponin slightly elevated as well as 0.2. CBC was obtained and was normal except for mildly decreased hemoglobin at 9.5. Neutrophil count was elevated at 93% with absolute neutrophils elevated at 8.9. Urinalysis was within normal limits and urine culture has been obtained. Because of sepsis physiology with recent hypotension patient will be admitted to the stepdown unit. Patient also reports in the past 1 hour he is developed slight nonproductive cough.   Review of Systems   In addition to the HPI above,  No chills, myalgias or other constitutional symptoms No Headache, changes with Vision or hearing, new weakness, tingling, numbness in any extremity, No problems swallowing food or Liquids, indigestion/reflux No palpitations, orthopnea or DOE No Abdominal pain, emesis, no melena or hematochezia, no dark tarry stools, Bowel movements are regular, No dysuria, hematuria or flank pain No new skin rashes, lesions, masses or bruises, No new joints pains-aches No recent weight gain or loss No polyuria, polydypsia or polyphagia,  *A full 10 point Review of Systems was done, except as stated above, all other Review of Systems were negative.  Social History Social History  Substance Use Topics  . Smoking status: Never Smoker   . Smokeless tobacco: Never Used  . Alcohol Use: Yes     Comment: once a month    Resides at: Private residence  Lives with: Alone  Ambulatory status: Has ankle brace on right foot   Family History Family History  Problem Relation Age of Onset  . Cancer Father     throat ca  . Cancer Brother     pituitary ca  . Heart disease Mother      Prior to Admission medications   Medication Sig Start Date End Date Taking? Authorizing Provider  amLODipine (NORVASC) 5 MG tablet Take 5 mg by mouth daily.     Historical Provider, MD  Armodafinil 250 MG tablet Take 250 mg by mouth daily.    Historical  Provider, MD  aspirin EC 81 MG tablet Take 81 mg by mouth daily.    Historical Provider, MD  clindamycin (CLINDAGEL) 1 % gel APPLY TO AFFECTED AREA(S) TWICE DAILY 06/25/15   [name], MD  diphenhydrAMINE (BENADRYL) 25 mg capsule Take 25 mg by mouth every 6 (six) hours as needed for itching.    Historical Provider, MD  emollient (BIAFINE) cream Apply 1 application topically 3 (three) times daily as needed (pain).     Historical Provider, MD  famotidine (PEPCID) 20 MG tablet Take 20 mg by mouth 2 (two) times daily.  Historical Provider, MD  fluticasone (FLONASE) 50 MCG/ACT nasal spray Place 2 sprays into both nostrils daily. 07/24/15   Florencia Reasons, MD  gabapentin (NEURONTIN) 100 MG capsule Take 600 mg by mouth 2 (two) times daily. Takes with supper and at bedtime    Historical Provider, MD  guaiFENesin (MUCINEX) 600 MG 12 hr tablet Take 1 tablet (600 mg total) by mouth 2 (two) times daily. 07/24/15   Florencia Reasons, MD  hydrocortisone cream 0.5 % Apply topically 3 (three) times daily. 06/15/15   [name], MD  HYDROmorphone (DILAUDID) 4 MG tablet Take 1 tablet (4 mg total) by mouth every 4 (four) hours as needed for severe pain. 08/03/15   [name], MD  ibuprofen (ADVIL,MOTRIN) 200 MG tablet Take 600 mg by mouth every 6 (six) hours as needed for mild pain or moderate pain.     Historical Provider, MD  lansoprazole (PREVACID) 30 MG capsule Take 30 mg by mouth daily at 12 noon.    Historical Provider, MD  lidocaine (LIDODERM) 5 % Place 1 patch onto the skin daily. Remove & Discard patch within 12 hours or as directed by MD Patient taking differently: Place 1 patch onto the skin daily as needed (pain). Remove & Discard patch within 12 hours or as directed by MD 05/16/15   [name], MD  lidocaine (XYLOCAINE) 2 % solution Patient: Mix 1part 2% viscous lidocaine, 1part H20. Swish and/or swallow 30mL of this mixture, 82min before meals and at bedtime, up to QID Patient taking differently: Use as directed 20 mLs in the  mouth or throat every 6 (six) hours as needed. Patient: Mix 1part 2% viscous lidocaine, 1part H20. Swish and/or swallow 36mL of this mixture, 51min before meals and at bedtime, up to QID 05/14/15   Eppie Gibson, MD  lidocaine-prilocaine (EMLA) cream Apply 1 application topically as needed. To Port a cath site one hour prior to needle stick 05/09/15   [name], MD  Magnesium Oxide 400 MG CAPS Take 1 capsule (400 mg total) by mouth daily. 07/24/15   Florencia Reasons, MD  methadone (DOLOPHINE) 10 MG tablet Take 3 tablets (30 mg total) by mouth every 8 (eight) hours. 08/03/15   [name], MD  metoCLOPramide (REGLAN) 10 MG tablet Take 1 tablet (10 mg total) by mouth 4 (four) times daily. 08/03/15   [name], MD  Misc. Devices (PUMP IN STYLE ADVANCED) MISC Inject 1 cartridge into the vein See admin instructions. Adrucil 9350 mg  2.62ml/hr. Runs for 96 hours    Historical Provider, MD  ondansetron (ZOFRAN) 8 MG tablet Take 1 tablet (8 mg total) by mouth every 8 (eight) hours as needed for nausea. 05/31/15   [name], MD  polyethylene glycol powder (GLYCOLAX/MIRALAX) powder Take 17 g by mouth 2 (two) times daily. 04/06/13   Veryl Speak, MD  promethazine (PHENERGAN) 25 MG tablet Take 1 tablet (25 mg total) by mouth every 6 (six) hours as needed for nausea. 05/31/15   [name], MD  scopolamine (TRANSDERM-SCOP) 1 MG/3DAYS Place 1 patch (1.5 mg total) onto the skin every 3 (three) days. 06/29/15   [name], MD  sucralfate (CARAFATE) 1 G tablet Dissolve 1 tablet in 10 mL H20 and swallow up to QID as needed for sore throat Patient taking differently: Take 1 g by mouth 4 (four) times daily as needed (sore throat). Dissolve 1 tablet in 10 mL H20 and swallow up to QID as needed for sore throat 05/14/15   Eppie Gibson, MD  tobramycin (TOBREX) 0.3 % ophthalmic solution Place 1 drop into the left eye every 4 (four) hours. 07/26/15   [name], MD  traZODone (DESYREL) 100 MG tablet Take 100 mg by mouth at bedtime.    Historical  Provider, MD    Allergies  Allergen Reactions  . Morphine And Related Anaphylaxis    Physical Exam  Vitals  Blood pressure 96/52, pulse 77, temperature 98.4 F (36.9 C), temperature source Oral, resp. rate 22, SpO2 99 %.   General:  In no acute distress, appears mildly toxic   Psych:  Normal affect, Denies Suicidal or Homicidal ideations, Awake Alert, Oriented X 3. Speech and thought patterns are clear and appropriate, no apparent short term memory deficits  Neuro:   No focal neurological deficits, CN II through XII intact, Strength 5/5 all 4 extremities, Sensation intact all 4 extremities.  ENT:  Ears and Eyes appear Normal, Conjunctivae clear, PER. Moist oral mucosa without erythema or exudates.  Neck:  Supple, No lymphadenopathy appreciated  Respiratory:  Symmetrical chest wall movement, Good air movement bilaterally, coarse lung sounds. 2 L  Cardiac:  RRR-review his tachycardia has resolved, No Murmurs, no LE edema noted, no JVD, No carotid bruits, peripheral pulses palpable at 2+  Abdomen:  Positive bowel sounds, Soft, Non tender, Non distended,  No masses appreciated, no obvious hepatosplenomegaly  Skin:  No Cyanosis, Normal Skin Turgor, No Skin Rash or Bruise.  Extremities: Symmetrical without obvious trauma or injury,  no effusions.  Data Review  CBC  Recent Labs Lab 08/03/15 1034 08/04/15 0327 08/04/15 0740  WBC 8.7 9.5 8.8  HGB 9.9* 9.5* 7.8*  HCT 31.2* 29.4* 25.1*  PLT 161 171 141*  MCV 92.6 93.6 93.7  MCH 29.4 30.3 29.1  MCHC 31.7* 32.3 31.1  RDW 17.4* 17.6* 17.7*  LYMPHSABS 0.4* 0.3*  --   MONOABS 0.4 0.4  --   EOSABS 0.0 0.0  --   BASOSABS 0.0 0.0  --     Chemistries   Recent Labs Lab 08/03/15 1034 08/04/15 0327  NA 139 135  K 4.5 4.1  CL  --  97*  CO2 29 29  GLUCOSE 173* 201*  BUN 10.4 13  CREATININE 1.2 1.30*  CALCIUM 9.3 8.7*  MG 1.6  --   AST 17 21  ALT 15 14*  ALKPHOS 110 90  BILITOT 0.40 0.6    estimated creatinine  clearance is 70.5 mL/min (by C-G formula based on Cr of 1.3).  No results for input(s): TSH, T4TOTAL, T3FREE, THYROIDAB in the last 72 hours.  Invalid input(s): FREET3  Coagulation profile No results for input(s): INR, PROTIME in the last 168 hours.  No results for input(s): DDIMER in the last 72 hours.  Cardiac Enzymes  Recent Labs Lab 08/04/15 0336  TROPONINI 0.20*    Invalid input(s): POCBNP  Urinalysis    Component Value Date/Time   COLORURINE YELLOW 08/04/2015 0349   APPEARANCEUR CLEAR 08/04/2015 0349   LABSPEC 1.012 08/04/2015 0349   PHURINE 6.0 08/04/2015 0349   GLUCOSEU NEGATIVE 08/04/2015 0349   HGBUR NEGATIVE 08/04/2015 0349   BILIRUBINUR NEGATIVE 08/04/2015 0349   KETONESUR NEGATIVE 08/04/2015 0349   PROTEINUR NEGATIVE 08/04/2015 0349   UROBILINOGEN 0.2 08/04/2015 0349   NITRITE NEGATIVE 08/04/2015 0349   LEUKOCYTESUR NEGATIVE 08/04/2015 0349    Imaging results:   Dg Chest 2 View  07/22/2015   CLINICAL DATA:  Patient with cough, congestion and sore throat.  EXAM: CHEST  2 VIEW  COMPARISON:  Chest  radiograph 06/04/2015  FINDINGS: Anterior chest wall Port-A-Cath is stable in position with tip projecting over the superior vena cava. Stable cardiac and mediastinal contours. No consolidative pulmonary opacities. No pleural effusion or pneumothorax. Mid thoracic spine degenerative changes.  IMPRESSION: No acute cardiopulmonary process.   Electronically Signed   By: Lovey Newcomer M.D.   On: 07/22/2015 15:07   Dg Ankle Complete Right  07/23/2015   CLINICAL DATA:  Right ankle fracture.  EXAM: RIGHT ANKLE - COMPLETE 3+ VIEW  COMPARISON:  06/04/2015  FINDINGS: Oblique fracture of the lateral malleolus, Weber B morphology. Medial mortise remains widened at 7 mm, indicating deltoid ligament tear. There is some early healing response along the lateral malleolar fracture without union. I do not observe a posterior malleolar component of the fracture.  Plantar and Achilles  calcaneal spurs. No other fractures identified.  IMPRESSION: 1. Oblique lateral malleolar fracture compatible with Weber B injury (supination -external rotation). Abnormally widened medial mortise indicating deltoid ligament tear. This fracture is typically considered unstable.   Electronically Signed   By: Van Clines M.D.   On: 07/23/2015 13:46   Ct Angio Chest Pe W/cm &/or Wo Cm  08/04/2015   CLINICAL DATA:  Shortness of breath, history of stage IV head and neck cancer.  EXAM: CT ANGIOGRAPHY CHEST WITH CONTRAST  TECHNIQUE: Multidetector CT imaging of the chest was performed using the standard protocol during bolus administration of intravenous contrast. Multiplanar CT image reconstructions and MIPs were obtained to evaluate the vascular anatomy. Per technologist note, patient has power port was injected and leak, which Re occurred on second attempt.  CONTRAST:  157mL OMNIPAQUE IOHEXOL 350 MG/ML SOLN  COMPARISON:  Chest radiograph August 04, 2015 at 3:11 p.m.  FINDINGS: Suboptimal pulmonary arterial contrast opacification. Main pulmonary artery is not enlarged. No pulmonary arterial filling defects to the segmental branches, limited assessment of distal vessels due to contrast bolus timing.  The heart is mildly enlarged. Mild coronary artery calcifications. No pericardial fluid collections. No lymphadenopathy by CT size criteria.  Patchy consolidation RIGHT upper lobe with air bronchogram. Patchy ground-glass opacities RIGHT middle lobe. Pleural thickening and atelectasis in the lung bases with patchy LEFT lower lobe ground-glass opacities. Tracheobronchial tree is patent and midline. No pneumothorax.  Included view of the abdomen is normal. Ill-defined sclerosis RIGHT T11 pedicle. Subcentimeter sclerotic lesion LEFT T4. Scleroses and probable large T2 inferior endplate Schmorl's node. Inferior manubrial focal sclerosis. Subacute RIGHT posterior rib fractures. Single lumen RIGHT chest Port-A-Cath.   Review of the MIP images confirms the above findings.  IMPRESSION: Suboptimal contrast bolus timing without pulmonary embolism to at least the segmental branches.  Multifocal consolidation lungs compatible with pneumonia. Additional atelectasis/scarring.  Sclerotic lesions in the spine concerning for metastatic disease. Possible manubrial metastasis. These would better be better characterized on MRI of the thoracic spine as clinically indicated, on a nonemergent basis.  Mild cardiomegaly.   Electronically Signed   By: Elon Alas M.D.   On: 08/04/2015 06:24   Nm Pet Image Restag (ps) Skull Base To Thigh  08/03/2015   CLINICAL DATA:  Subsequent treatment strategy for restaging of tongue cancer.  EXAM: NUCLEAR MEDICINE PET SKULL BASE TO THIGH  TECHNIQUE: 12.1 mCi F-18 FDG was injected intravenously. Full-ring PET imaging was performed from the skull base to thigh after the radiotracer. CT data was obtained and used for attenuation correction and anatomic localization.  FASTING BLOOD GLUCOSE:  Value: 108 mg/dl  COMPARISON:  04/24/2015.  FINDINGS: NECK  Improvement  to resolution of left tongue base primary. Vague hyper metabolism remains measuring a S.U.V. max of 3.9. On the prior exam, this measured a S.U.V. max of 9.7.  Marked improvement in left-sided cervical nodal hypermetabolism. A left-sided jugulodigastric node measures 8 mm and a S.U.V. max of 2.8. On the prior exam, a nodal mass at this location measured a S.U.V. max of 13.4.  A right jugulodigastric node measures 7 mm and a S.U.V. max of 4.0 on image 37. This measured similar in size and was not hypermetabolic on the prior exam.  A right posterior triangle node measures 6 mm and a S.U.V. max of 2.1 on image 41. This node measured similar in size and was not hypermetabolic on the prior.  CHEST  Resolution of previously described hypermetabolic right lower lobe pulmonary nodule. Hypermetabolism corresponding to probable posterior right upper lobe  nodularity. This measures a S.U.V. max of 3.3, including on image 31. No thoracic nodal hypermetabolism.  ABDOMEN/PELVIS  A porta hepatis node measures 1.0 cm and a S.U.V. max of 3.4 on 117. This node was similar in size and not hypermetabolic on the prior.  SKELETON  Improved osseous metastasis. Previously described T2 and V69 hypermetabolic lesions are improved to resolved. There is multi focal primarily right-sided muscular and soft tissue activity which could relate to motion after radiopharmaceutical injection and/or trauma. New hypermetabolism at the site of the healing posterior right ninth rib fracture on image 93 of series 4.  CT IMAGES PERFORMED FOR ATTENUATION CORRECTION  Mucosal thickening and fluid in the right maxillary sinus. Mild cardiomegaly with multivessel coronary artery atherosclerosis. Lower pole left renal collecting system calculus. Probable right nephrolithiasis as well. Increased density in the jejunal mesenteric fat. Example image 131. This may be slightly increased. Left-sided lumbar spine fixation. Interval sclerosis in the right-sided the T11 vertebral body secondary to healing of metastasis. Healing pathologic fracture at the fourth posterior left rib.  IMPRESSION: 1. Response to therapy of tongue base primary, left cervical nodal metastasis, and osseous metastasis. A resolved right lower lobe lung nodule was likely due to an isolated metastasis. 2. New right-sided cervical nodal hypermetabolism, suspicious for metastatic disease. 3. Posterior right upper lobe mild hypermetabolism, likely corresponding to minimal nodularity. Correlate with symptoms of infection or possible aspiration. 4. Isolated porta hepatis hypermetabolic node is indeterminate. Favored to be reactive. An atypical distribution of metastasis could look similar. 5. Incidental findings, including coronary artery disease and nephrolithiasis. 6. Increased density in the jejunal mesenteric fat could represent mesenteric  adenitis/panniculitis.   Electronically Signed   By: Abigail Miyamoto M.D.   On: 08/03/2015 11:59   Dg Chest Port 1 View  08/04/2015   CLINICAL DATA:  Acute onset of shortness of breath. Initial encounter.  EXAM: PORTABLE CHEST - 1 VIEW  COMPARISON:  Chest radiograph performed 07/22/2015, and PET/CT performed 08/02/2015  FINDINGS: The lungs are well-aerated. Right basilar airspace opacity raises concern for pneumonia. Obscuration of the left hemidiaphragm may also reflect airspace opacity. No definite pleural effusion or pneumothorax is seen.  The cardiomediastinal silhouette is borderline normal in size. A right-sided chest port is seen ending about the distal SVC. No acute osseous abnormalities are seen.  IMPRESSION: Right basilar airspace opacity raises concern for pneumonia. Obscuration of the left hemidiaphragm may also reflect airspace opacity. Followup PA and lateral chest X-ray is recommended in 3-4 weeks following trial of antibiotic therapy to ensure resolution and exclude underlying new or recurrent malignancy.   Electronically Signed   By: Jacqulynn Cadet  Chang M.D.   On: 08/04/2015 03:15     EKG: (Independently reviewed) sinus tachycardia with PACs ventricular rate 116 bpm with underlying right bundle branch block, QTC 481 minute milliseconds   Assessment & Plan  Principal Problem:   Sepsis -Admit to stepdown -Etiology appears to be secondary to HCAP -Follow up on blood cultures and sputum cultures -Lactic acid 2 collections have been within normal limits but with slight trend up several repeat a third lactic acid at 9:30 AM -Initial issues with mild hypotension which thus far have improved with fluid resuscitation (total of 3 L) continue normal saline at 150 mL per hour -current MAP > 65  Active Problems:   Acute respiratory failure with hypoxia/HCAP  -Continue vancomycin and Zosyn with pharmacy dosing -Continue supportive care with oxygen, guaifenesin and flutter valve -Obtain sputum  culture and follow up on results    Elevated troponin -Likely related to underlying septic process -Since presented with dyspnea could be a marker of underlying ischemia so we'll cycle troponin and check echocardiogram -Could also be a marker of RV strain in setting of pulmonary embolism; CT could not exclude pulmonary embolism in segmental branches so we'll check lower extremity venous duplex to rule out DVT    Tongue cancer -Followed as outpatient by [name]    Chronic neck pain -Continue preadmission methadone noting dosage recently increased on 9/2 to 30 mg 3 times a day -Continue Dilaudid for breakthrough pain but since blood pressures have been suboptimal in the past 24 hours we'll utilize IV formulation initially and if blood pressure remains stable/improves can transition back to usual oral dosing    RBBB -Chronic finding dating back several years and likely related to patient's underlying sleep apnea    Anemia due to antineoplastic chemotherapy -Current hemoglobin stable and at baseline of around 9.3-9.9 -Repeat CBC after hydration and again in a.m.    Chemotherapy-induced nausea -Continue preadmission anti-emetic medications    Acute hyperglycemia -Glucose greater than 200 -Hemoglobin A1c on 8/20 was 7.3 -Begin carb modified diet -Follow CBGs -Suspect may have early diabetes therefore may benefit from diabetes educator consultation for diet management    OSA -Continue CPAP at hour of sleep    DVT Prophylaxis: Lovenox  Family Communication:   Daughters at bedside  Code Status:  Full code  Condition:  Guarded  Discharge disposition:  Anticipate discharge to home with sepsis physiology has resolved and pneumonia symptoms are adequately treated/control  Time spent in minutes : 60      ELLIS,ALLISON L. ANP on 08/04/2015 at 8:04 AM  Between 7am to 7pm - Pager - [phone]  After 7pm go to [email or web address] - password [email or web address]  And look for the night coverage  person covering me after hours  [facility]  Addendum  I personally evaluate patient on 08/04/2015 and agree with the above findings. 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 He presents with complaints of worsening shortness of breath, chest pain, cough. He was found initially have an O2 sat of 88%. Workup in the emergency department included a CT scan of lungs with IV contrast which I personally reviewed and showed multifocal pneumonia. He was hypotensive in the emergency department and required several boluses of normal saline. He was also found to have a  temperature of 102.6. Patient is sepsi with source of infection likely healthcare associated pneumonia. He had a mild troponin elevation of 0.2 which could be related to critical illness and mild AKI. I reviewed EKGs he did not have acute ischemic changes. On exam he is awake and alert and mentating well. He will be admitted to the step down unit treated with broad-spectrum IV antimicrobial therapy with Vancomycin and Zosyn. Continue aggressive IV fluid resuscitation, trend troponins, lactate, repeat chest x-ray in a.m.

## 2015-08-04 NOTE — ED Notes (Signed)
Per Erin Hearing, NP patient to be placed in stepdown instead of telemetry.  Bed control notified.

## 2015-08-04 NOTE — ED Notes (Signed)
EDP at bedside  

## 2015-08-04 NOTE — ED Provider Notes (Signed)
CSN: [id number]     Arrival date & time 08/04/15  0234 History  This chart was scribed for Alfonzo Beers, MD by Evelene Croon, ED Scribe. This patient was seen in room C24C/C24C and the patient's care was started 2:50 AM.    Chief Complaint  Patient presents with  . Shortness of Breath  A level 5 caveat pertains due to urgent need for intervention Patient is a 59 y.o. male presenting with shortness of breath. The history is provided by the patient. No language interpreter was used.  Shortness of Breath Severity:  Moderate Timing:  Constant Associated symptoms: chest pain      HPI Comments:  Shane Cantu is a 59 y.o. male with a history of sleep apnea and stage IV tongue cancer who presents to the Emergency Department via EMS complaining of moderate constant SOB that started a few hours PTA. He states he was seen earlier today to have IV fluids at hem/onc.  Pt states he was sleeping when symptom started. He feels as if he is "breathing fluid". He reports associated central CP that he describes as a pressure which has resolved at this time. He denies swelling in his BLE. Pt was given albuterol and duoneb en route with minimal improvement.  There are no other associated systemic symptoms, there are no other alleviating or modifying factors.   PCP [place] Past Medical History  Diagnosis Date  . Chronic pain   . Back pain   . Chronic neck pain   . History of kidney stones   . Allergic rhinitis   . Hypertension   . Migraine headache without aura   . Sleep apnea     does not use cpap  . GERD (gastroesophageal reflux disease)   . Neuropathy   . Arthritis   . Cancer      may 2016 tongue cancer  . Bacterial conjunctivitis of left eye 07/26/2015   Past Surgical History  Procedure Laterality Date  . Back surgery  03/1993, 10/2002    Dr. Louanne Skye  . Tonsillectomy      as a child  . Neck surgery  2004    Dr. Louanne Skye  . Lymph node biopsy    . Nasal sinusotomy  1977  . Colonoscopy    .  Multiple extractions with alveoloplasty N/A 04/26/2015    Procedure: Extraction of tooth #'s 6,11,12,15,20,21,22,27,28 with alveoloplasty;  Surgeon: [name], DDS;  Location: [facility];  Service: Oral Surgery;  Laterality: N/A;   Family History  Problem Relation Age of Onset  . Cancer Father     throat ca  . Cancer Brother     pituitary ca  . Heart disease Mother    Social History  Substance Use Topics  . Smoking status: Never Smoker   . Smokeless tobacco: Never Used  . Alcohol Use: Yes     Comment: once a month    Review of Systems  Respiratory: Positive for shortness of breath.   Cardiovascular: Positive for chest pain.  ROS reviewed and all otherwise negative except for mentioned in HPI  Allergies  Morphine and related  Home Medications   Prior to Admission medications   Medication Sig Start Date End Date Taking? Authorizing Provider  amLODipine (NORVASC) 5 MG tablet Take 5 mg by mouth daily.     Historical Provider, MD  Armodafinil 250 MG tablet Take 250 mg by mouth daily.    Historical Provider, MD  aspirin EC 81 MG tablet Take 81 mg by  mouth daily.    Historical Provider, MD  clindamycin (CLINDAGEL) 1 % gel APPLY TO AFFECTED AREA(S) TWICE DAILY 06/25/15   [name], MD  diphenhydrAMINE (BENADRYL) 25 mg capsule Take 25 mg by mouth every 6 (six) hours as needed for itching.    Historical Provider, MD  emollient (BIAFINE) cream Apply 1 application topically 3 (three) times daily as needed (pain).     Historical Provider, MD  famotidine (PEPCID) 20 MG tablet Take 20 mg by mouth 2 (two) times daily.    Historical Provider, MD  fluticasone (FLONASE) 50 MCG/ACT nasal spray Place 2 sprays into both nostrils daily. 07/24/15   Florencia Reasons, MD  gabapentin (NEURONTIN) 100 MG capsule Take 600 mg by mouth 2 (two) times daily. Takes with supper and at bedtime    Historical Provider, MD  guaiFENesin (MUCINEX) 600 MG 12 hr tablet Take 1 tablet (600 mg total) by mouth 2 (two) times daily.  07/24/15   Florencia Reasons, MD  hydrocortisone cream 0.5 % Apply topically 3 (three) times daily. 06/15/15   [name], MD  HYDROmorphone (DILAUDID) 4 MG tablet Take 1 tablet (4 mg total) by mouth every 4 (four) hours as needed for severe pain. 08/03/15   [name], MD  ibuprofen (ADVIL,MOTRIN) 200 MG tablet Take 600 mg by mouth every 6 (six) hours as needed for mild pain or moderate pain.     Historical Provider, MD  lansoprazole (PREVACID) 30 MG capsule Take 30 mg by mouth daily at 12 noon.    Historical Provider, MD  lidocaine (LIDODERM) 5 % Place 1 patch onto the skin daily. Remove & Discard patch within 12 hours or as directed by MD Patient taking differently: Place 1 patch onto the skin daily as needed (pain). Remove & Discard patch within 12 hours or as directed by MD 05/16/15   [name], MD  lidocaine (XYLOCAINE) 2 % solution Patient: Mix 1part 2% viscous lidocaine, 1part H20. Swish and/or swallow 1mL of this mixture, 75min before meals and at bedtime, up to QID Patient taking differently: Use as directed 20 mLs in the mouth or throat every 6 (six) hours as needed. Patient: Mix 1part 2% viscous lidocaine, 1part H20. Swish and/or swallow 57mL of this mixture, 40min before meals and at bedtime, up to QID 05/14/15   Eppie Gibson, MD  lidocaine-prilocaine (EMLA) cream Apply 1 application topically as needed. To Port a cath site one hour prior to needle stick 05/09/15   [name], MD  Magnesium Oxide 400 MG CAPS Take 1 capsule (400 mg total) by mouth daily. 07/24/15   Florencia Reasons, MD  methadone (DOLOPHINE) 10 MG tablet Take 3 tablets (30 mg total) by mouth every 8 (eight) hours. 08/03/15   [name], MD  metoCLOPramide (REGLAN) 10 MG tablet Take 1 tablet (10 mg total) by mouth 4 (four) times daily. 08/03/15   [name], MD  Misc. Devices (PUMP IN STYLE ADVANCED) MISC Inject 1 cartridge into the vein See admin instructions. Adrucil 9350 mg  2.32ml/hr. Runs for 96 hours    Historical Provider, MD  ondansetron (ZOFRAN)  8 MG tablet Take 1 tablet (8 mg total) by mouth every 8 (eight) hours as needed for nausea. 05/31/15   [name], MD  polyethylene glycol powder (GLYCOLAX/MIRALAX) powder Take 17 g by mouth 2 (two) times daily. 04/06/13   Veryl Speak, MD  promethazine (PHENERGAN) 25 MG tablet Take 1 tablet (25 mg total) by mouth every 6 (six) hours as needed for nausea. 05/31/15  [name], MD  scopolamine (TRANSDERM-SCOP) 1 MG/3DAYS Place 1 patch (1.5 mg total) onto the skin every 3 (three) days. 06/29/15   [name], MD  sucralfate (CARAFATE) 1 G tablet Dissolve 1 tablet in 10 mL H20 and swallow up to QID as needed for sore throat Patient taking differently: Take 1 g by mouth 4 (four) times daily as needed (sore throat). Dissolve 1 tablet in 10 mL H20 and swallow up to QID as needed for sore throat 05/14/15   Eppie Gibson, MD  tobramycin (TOBREX) 0.3 % ophthalmic solution Place 1 drop into the left eye every 4 (four) hours. 07/26/15   [name], MD  traZODone (DESYREL) 100 MG tablet Take 100 mg by mouth at bedtime.    Historical Provider, MD   BP 101/72 mmHg  Pulse 78  Temp(Src) 98.4 F (36.9 C) (Oral)  Resp 16  SpO2 93%  Vitals reviewed Physical Exam  Physical Examination: General appearance - alert, well appearing, and in no distress Mental status - alert, oriented to person, place, and time Eyes - no conjunctival injection, no scleral icterus Mouth - mucous membranes moist, pharynx normal without lesions Neck - supple, no significant adenopathy Chest - clear to auscultation, no wheezes, no rales, scattered rhonchi at the bases bilaterally, symmetric air entry, increased respiratory effort Heart - tachycardic rate, regular rhythm, normal S1, S2, no murmurs, rubs, clicks or gallops Abdomen - soft, nontender, nondistended, no masses or organomegaly Neurological - alert, oriented, normal speech,  Extremities - peripheral pulses normal, no pedal edema, no clubbing or cyanosis Skin - normal coloration and  turgor, no rashes  ED Course  Procedures   DIAGNOSTIC STUDIES:  Oxygen Saturation is 95% on Mask, adequate by my interpretation.    COORDINATION OF CARE:  2:54 AM Discussed treatment plan with pt at bedside and pt agreed to plan.  CRITICAL CARE Performed by: Threasa Beards Total critical care time: 45 Critical care time was exclusive of separately billable procedures and treating other patients. Critical care was necessary to treat or prevent imminent or life-threatening deterioration. Critical care was time spent personally by me on the following activities: development of treatment plan with patient and/or surrogate as well as nursing, discussions with consultants, evaluation of patient's response to treatment, examination of patient, obtaining history from patient or surrogate, ordering and performing treatments and interventions, ordering and review of laboratory studies, ordering and review of radiographic studies, pulse oximetry and re-evaluation of patient's condition.  6:46 AM d/w Dr. Arnoldo Morale, triad for admission, pt to go to telemetry bed.   ED ECG REPORT   Date: 08/04/2015  Rate: 116  Rhythm: sinus tachycardia  QRS Axis: right  Intervals: right bundle branch block  ST/T Wave abnormalities: nonspecific ST/T changes  Conduction Disutrbances:right bundle branch block  Narrative Interpretation:   Old EKG Reviewed: no significant changes  Link not available in epic for interpretation into muse  Labs Review Labs Reviewed  COMPREHENSIVE METABOLIC PANEL - Abnormal; Notable for the following:    Chloride 97 (*)    Glucose, Bld 201 (*)    Creatinine, Ser 1.30 (*)    Calcium 8.7 (*)    Albumin 3.1 (*)    ALT 14 (*)    GFR calc non Af Amer 58 (*)    All other components within normal limits  CBC WITH DIFFERENTIAL/PLATELET - Abnormal; Notable for the following:    RBC 3.14 (*)    Hemoglobin 9.5 (*)    HCT 29.4 (*)  RDW 17.6 (*)    Neutrophils Relative % 93 (*)     Neutro Abs 8.9 (*)    Lymphocytes Relative 3 (*)    Lymphs Abs 0.3 (*)    All other components within normal limits  TROPONIN I - Abnormal; Notable for the following:    Troponin I 0.20 (*)    All other components within normal limits  I-STAT TROPOININ, ED - Abnormal; Notable for the following:    Troponin i, poc 0.19 (*)    All other components within normal limits  CULTURE, BLOOD (ROUTINE X 2)  CULTURE, BLOOD (ROUTINE X 2)  URINE CULTURE  URINALYSIS, ROUTINE W REFLEX MICROSCOPIC (NOT AT [facility])  BASIC METABOLIC PANEL  CBC  CORTISOL  PROCALCITONIN  I-STAT CG4 LACTIC ACID, ED  I-STAT CG4 LACTIC ACID, ED    Imaging Review Ct Angio Chest Pe W/cm &/or Wo Cm  08/04/2015   CLINICAL DATA:  Shortness of breath, history of stage IV head and neck cancer.  EXAM: CT ANGIOGRAPHY CHEST WITH CONTRAST  TECHNIQUE: Multidetector CT imaging of the chest was performed using the standard protocol during bolus administration of intravenous contrast. Multiplanar CT image reconstructions and MIPs were obtained to evaluate the vascular anatomy. Per technologist note, patient has power port was injected and leak, which Re occurred on second attempt.  CONTRAST:  167mL OMNIPAQUE IOHEXOL 350 MG/ML SOLN  COMPARISON:  Chest radiograph August 04, 2015 at 3:11 p.m.  FINDINGS: Suboptimal pulmonary arterial contrast opacification. Main pulmonary artery is not enlarged. No pulmonary arterial filling defects to the segmental branches, limited assessment of distal vessels due to contrast bolus timing.  The heart is mildly enlarged. Mild coronary artery calcifications. No pericardial fluid collections. No lymphadenopathy by CT size criteria.  Patchy consolidation RIGHT upper lobe with air bronchogram. Patchy ground-glass opacities RIGHT middle lobe. Pleural thickening and atelectasis in the lung bases with patchy LEFT lower lobe ground-glass opacities. Tracheobronchial tree is patent and midline. No pneumothorax.  Included view of  the abdomen is normal. Ill-defined sclerosis RIGHT T11 pedicle. Subcentimeter sclerotic lesion LEFT T4. Scleroses and probable large T2 inferior endplate Schmorl's node. Inferior manubrial focal sclerosis. Subacute RIGHT posterior rib fractures. Single lumen RIGHT chest Port-A-Cath.  Review of the MIP images confirms the above findings.  IMPRESSION: Suboptimal contrast bolus timing without pulmonary embolism to at least the segmental branches.  Multifocal consolidation lungs compatible with pneumonia. Additional atelectasis/scarring.  Sclerotic lesions in the spine concerning for metastatic disease. Possible manubrial metastasis. These would better be better characterized on MRI of the thoracic spine as clinically indicated, on a nonemergent basis.  Mild cardiomegaly.   Electronically Signed   By: Elon Alas M.D.   On: 08/04/2015 06:24   Nm Pet Image Restag (ps) Skull Base To Thigh  08/03/2015   CLINICAL DATA:  Subsequent treatment strategy for restaging of tongue cancer.  EXAM: NUCLEAR MEDICINE PET SKULL BASE TO THIGH  TECHNIQUE: 12.1 mCi F-18 FDG was injected intravenously. Full-ring PET imaging was performed from the skull base to thigh after the radiotracer. CT data was obtained and used for attenuation correction and anatomic localization.  FASTING BLOOD GLUCOSE:  Value: 108 mg/dl  COMPARISON:  04/24/2015.  FINDINGS: NECK  Improvement to resolution of left tongue base primary. Vague hyper metabolism remains measuring a S.U.V. max of 3.9. On the prior exam, this measured a S.U.V. max of 9.7.  Marked improvement in left-sided cervical nodal hypermetabolism. A left-sided jugulodigastric node measures 8 mm and a S.U.V. max of  2.8. On the prior exam, a nodal mass at this location measured a S.U.V. max of 13.4.  A right jugulodigastric node measures 7 mm and a S.U.V. max of 4.0 on image 37. This measured similar in size and was not hypermetabolic on the prior exam.  A right posterior triangle node measures 6  mm and a S.U.V. max of 2.1 on image 41. This node measured similar in size and was not hypermetabolic on the prior.  CHEST  Resolution of previously described hypermetabolic right lower lobe pulmonary nodule. Hypermetabolism corresponding to probable posterior right upper lobe nodularity. This measures a S.U.V. max of 3.3, including on image 31. No thoracic nodal hypermetabolism.  ABDOMEN/PELVIS  A porta hepatis node measures 1.0 cm and a S.U.V. max of 3.4 on 117. This node was similar in size and not hypermetabolic on the prior.  SKELETON  Improved osseous metastasis. Previously described T2 and F68 hypermetabolic lesions are improved to resolved. There is multi focal primarily right-sided muscular and soft tissue activity which could relate to motion after radiopharmaceutical injection and/or trauma. New hypermetabolism at the site of the healing posterior right ninth rib fracture on image 93 of series 4.  CT IMAGES PERFORMED FOR ATTENUATION CORRECTION  Mucosal thickening and fluid in the right maxillary sinus. Mild cardiomegaly with multivessel coronary artery atherosclerosis. Lower pole left renal collecting system calculus. Probable right nephrolithiasis as well. Increased density in the jejunal mesenteric fat. Example image 131. This may be slightly increased. Left-sided lumbar spine fixation. Interval sclerosis in the right-sided the T11 vertebral body secondary to healing of metastasis. Healing pathologic fracture at the fourth posterior left rib.  IMPRESSION: 1. Response to therapy of tongue base primary, left cervical nodal metastasis, and osseous metastasis. A resolved right lower lobe lung nodule was likely due to an isolated metastasis. 2. New right-sided cervical nodal hypermetabolism, suspicious for metastatic disease. 3. Posterior right upper lobe mild hypermetabolism, likely corresponding to minimal nodularity. Correlate with symptoms of infection or possible aspiration. 4. Isolated porta hepatis  hypermetabolic node is indeterminate. Favored to be reactive. An atypical distribution of metastasis could look similar. 5. Incidental findings, including coronary artery disease and nephrolithiasis. 6. Increased density in the jejunal mesenteric fat could represent mesenteric adenitis/panniculitis.   Electronically Signed   By: Abigail Miyamoto M.D.   On: 08/03/2015 11:59   Dg Chest Port 1 View  08/04/2015   CLINICAL DATA:  Acute onset of shortness of breath. Initial encounter.  EXAM: PORTABLE CHEST - 1 VIEW  COMPARISON:  Chest radiograph performed 07/22/2015, and PET/CT performed 08/02/2015  FINDINGS: The lungs are well-aerated. Right basilar airspace opacity raises concern for pneumonia. Obscuration of the left hemidiaphragm may also reflect airspace opacity. No definite pleural effusion or pneumothorax is seen.  The cardiomediastinal silhouette is borderline normal in size. A right-sided chest port is seen ending about the distal SVC. No acute osseous abnormalities are seen.  IMPRESSION: Right basilar airspace opacity raises concern for pneumonia. Obscuration of the left hemidiaphragm may also reflect airspace opacity. Followup PA and lateral chest X-ray is recommended in 3-4 weeks following trial of antibiotic therapy to ensure resolution and exclude underlying new or recurrent malignancy.   Electronically Signed   By: Garald Balding M.D.   On: 08/04/2015 03:15   I have personally reviewed and evaluated these images and lab results as part of my medical decision-making.   EKG Interpretation   Date/Time:  Saturday August 04 2015 02:44:03 EDT Ventricular Rate:  130 PR Interval:  56 QRS Duration: 136 QT Interval:  385 QTC Calculation: 566 R Axis:   -73 Text Interpretation:  Sinus tachycardia Atrial premature complex Right  bundle branch block Inferior infarct, old Probable posterior infarct,  acute Anterior infarct, age indeterminate Lateral leads are also involved  Confirmed by Christy Gentles  MD,  DONALD ([id number]) on 08/04/2015 3:11:18 AM      MDM   Final diagnoses:  HCAP (healthcare-associated pneumonia)  Fever and chills  Hypoxia  Metastatic cancer  Anemia, unspecified anemia type    Pt presenting with respiratory distress, initially requiring nonrebreather face mask oxygen, HR 130s, febrile to 102.6 rectally.  Sepsis protocol started, pt started on broad spectrum abx and given 31ml/kg bolus.  HR improved to 90, fever improved.  Pt weaned to 4L [place] O2.  States he feels improved after multiple rechecks.  Due to hx of cancer, shortness of breath and tachycardia CT angio chest was obtained, this shows no large PE.  Pt admitted to triad service for further management.  Blood pressure briefly low, responded to additional fluid bolus.    I personally performed the services described in this documentation, which was scribed in my presence. The recorded information has been reviewed and is accurate.    Alfonzo Beers, MD 08/04/15 [phone]

## 2015-08-04 NOTE — Progress Notes (Signed)
Pharmacy Consult for Pain Management Indication: Methadone >> Alternative Pain Management  Assessment: 67 YOM with hx stage IV tongue cancer who presented on 9/3 with SOB. The patient is noted to have chronic pain associated with his cancer and has had several adjustments in his pain regimen. It appears the patient recently started methadone in August as documented in an oncology office visit note on 8/12 and has been titrated up to a dose of 30 mg every 8 hours with continued prn po dilaudid for breakthrough pain.   One of the monitoring parameters for methadone is QTc interval for risk of QTc prolongation. The patient has had several EKGs this admission with several noted to have a prolonged QTc - most recently a QTc of 571 on 9/3 AM. This was discussed with the rounding team and it was decided to discontinue methadone and switch to an alternative pain regimen.  Prior to starting methadone mid-August, the patient was most recently on a Fentanyl 25 mcg patch which was stopped due to cost issues. The patient states that the 25 mcg patch was weak at managing his pain but is okay with starting at a Fent 50 mcg patch and continuing with the prn po dilaudid for breakthrough pain.   Ultimately, if the patient's pain can be managed over the weekend - pain management options and recommendations can be further discussed with the patient's oncologist on Monday. The patient is agreeable to the use of a Fent patch + prn po dilaudid for breakthrough pain and will let the team know if he needs these medications titrated.   Goal of Therapy:  Pain management  Plan:  1. D/c methadone (already done) 2. Start Fentanyl 50 mcg/hr patch - change out every 72 hours 3. Continue Dilaudid po 4 mg every 4 hours as needed for breakthrough pain 4. Will peripherally monitor the patient's pain control for need to adjust the medication regimen further.   Alycia Rossetti, PharmD, BCPS Clinical Pharmacist Pager:  [phone] 08/04/2015 2:31 PM

## 2015-08-04 NOTE — Progress Notes (Signed)
CRITICAL VALUE ALERT  Critical value received: blood culture gram positive cocci in cluster  Date of notification: 08/04/2015  Time of notification:  2219  Critical value read back: yes  Nurse who received alert:  Eulis Foster RN  MD notified (1st page):  L. [place]  Time of first page:  2220 MD notified (2nd page):  Time of second page:  Responding MD: Roger Shelter   Time MD responded: 2227

## 2015-08-04 NOTE — ED Notes (Signed)
Per CT scan was completed but without contrast and radiologist will attempt to read

## 2015-08-04 NOTE — Progress Notes (Addendum)
Clarification: pt says since Methadone increased that the Dilaudid PO was dc'd 9/2.  Repeat CBC reveals Hgb has dropped to 7.8- may be dilutional from recent fluid boluses therefore will ck CBC again at 330 pm (when rpt TNI due). Platelets have dropped to 141,000 and have been trending downward since 8/19. PCT is 4.01  SBP trending downward so 4th liter NS ordered by attending MD- if SBP trends consistently below 90 or MAP <65 will need PCCM evaluation.  Erin Hearing, ANP

## 2015-08-04 NOTE — ED Notes (Signed)
EDP notified that BP have been running low systolic, see new orders

## 2015-08-04 NOTE — Progress Notes (Signed)
Pt refuses CPAP. Pt encouraged to call RT if pt changes mind. No distress noted.

## 2015-08-04 NOTE — ED Notes (Signed)
Report attempted 

## 2015-08-04 NOTE — Progress Notes (Addendum)
QT prolongation -Repeat EKG w/ QTC 571 ms (3rd) -Pharmacist called to notify us -initial EKG 9/3 in ER with QTc 566 but FU EKG QTc was lower at 481 ms. -Methadone discontinued per recommendation of pharmacist -Patient with inadequate pain control on oral Dilaudid  4 mg q 4 hours at home but may need to resume this medication and just utilize higher dosage and consider other adjunctive medications -have also dc'd Desyrel, Reglan and Zofran -repeat EKG in am -history of hypomagnesemia so ck magnesium level  Addendum:  Pharmacist spoke with pt- previously good success with Fentanyl patch but stopped due to too expensive. Plan is to resume Fentanyl patch at 50 mcg and cont prn Dilaudid PO and decrease dose back to 4 mg until can discuss with oncologist. I will add [name] to treatment team.  Erin Hearing, ANP

## 2015-08-04 NOTE — ED Notes (Signed)
Pt back from CT, per CT tubing is faulty and is refusing to do CT due to faulty tubing. RN checked the tubing twice and noted no defect. Saline flushed and blood return noted, dressing changed, no saline noted on skin or under dressing. MD notified. No new orders at this time. CT will be held.

## 2015-08-05 ENCOUNTER — Inpatient Hospital Stay (HOSPITAL_BASED_OUTPATIENT_CLINIC_OR_DEPARTMENT_OTHER): Payer: PPO

## 2015-08-05 ENCOUNTER — Inpatient Hospital Stay (HOSPITAL_COMMUNITY): Payer: PPO

## 2015-08-05 DIAGNOSIS — R7881 Bacteremia: Secondary | ICD-10-CM | POA: Diagnosis present

## 2015-08-05 DIAGNOSIS — R7309 Other abnormal glucose: Secondary | ICD-10-CM

## 2015-08-05 DIAGNOSIS — T80219A Unspecified infection due to central venous catheter, initial encounter: Secondary | ICD-10-CM | POA: Diagnosis present

## 2015-08-05 DIAGNOSIS — A4101 Sepsis due to Methicillin susceptible Staphylococcus aureus: Secondary | ICD-10-CM | POA: Diagnosis present

## 2015-08-05 DIAGNOSIS — R0600 Dyspnea, unspecified: Secondary | ICD-10-CM

## 2015-08-05 DIAGNOSIS — Z113 Encounter for screening for infections with a predominantly sexual mode of transmission: Secondary | ICD-10-CM | POA: Diagnosis present

## 2015-08-05 DIAGNOSIS — A4102 Sepsis due to Methicillin resistant Staphylococcus aureus: Principal | ICD-10-CM

## 2015-08-05 LAB — MAGNESIUM: MAGNESIUM: 1.4 mg/dL — AB (ref 1.7–2.4)

## 2015-08-05 LAB — POCT I-STAT 3, ART BLOOD GAS (G3+)
Acid-Base Excess: 3 mmol/L — ABNORMAL HIGH (ref 0.0–2.0)
BICARBONATE: 31.5 meq/L — AB (ref 20.0–24.0)
O2 SAT: 100 %
PCO2 ART: 61.1 mmHg — AB (ref 35.0–45.0)
TCO2: 33 mmol/L (ref 0–100)
pH, Arterial: 7.321 — ABNORMAL LOW (ref 7.350–7.450)
pO2, Arterial: 333 mmHg — ABNORMAL HIGH (ref 80.0–100.0)

## 2015-08-05 LAB — GLUCOSE, CAPILLARY
GLUCOSE-CAPILLARY: 106 mg/dL — AB (ref 65–99)
GLUCOSE-CAPILLARY: 86 mg/dL (ref 65–99)
Glucose-Capillary: 104 mg/dL — ABNORMAL HIGH (ref 65–99)
Glucose-Capillary: 105 mg/dL — ABNORMAL HIGH (ref 65–99)

## 2015-08-05 LAB — URINE CULTURE: Culture: 2000

## 2015-08-05 LAB — HIV ANTIBODY (ROUTINE TESTING W REFLEX): HIV SCREEN 4TH GENERATION: NONREACTIVE

## 2015-08-05 LAB — TROPONIN I: Troponin I: 0.28 ng/mL — ABNORMAL HIGH (ref <0.031)

## 2015-08-05 MED ORDER — MODAFINIL 100 MG PO TABS
200.0000 mg | ORAL_TABLET | Freq: Every day | ORAL | Status: DC
  Filled 2015-08-05: qty 2

## 2015-08-05 MED ORDER — MAGNESIUM OXIDE 400 (241.3 MG) MG PO TABS: 400.0000 mg | ORAL_TABLET | Freq: Every day | ORAL | Status: DC

## 2015-08-05 MED ORDER — MAGNESIUM SULFATE 2 GM/50ML IV SOLN
2.0000 g | Freq: Once | INTRAVENOUS | Status: AC
  Administered 2015-08-05: 2 g via INTRAVENOUS
  Filled 2015-08-05: qty 50

## 2015-08-05 MED ORDER — INSULIN ASPART 100 UNIT/ML ~~LOC~~ SOLN: 0.0000 [IU] | SUBCUTANEOUS | Status: DC

## 2015-08-05 MED ORDER — INSULIN ASPART 100 UNIT/ML ~~LOC~~ SOLN: 0.0000 [IU] | Freq: Three times a day (TID) | SUBCUTANEOUS | Status: DC

## 2015-08-05 MED ORDER — ANTISEPTIC ORAL RINSE SOLUTION (CORINZ)
7.0000 mL | Freq: Four times a day (QID) | OROMUCOSAL | Status: DC
  Administered 2015-08-06 – 2015-08-12 (×21): 7 mL via OROMUCOSAL

## 2015-08-05 MED ORDER — ALBUTEROL SULFATE (2.5 MG/3ML) 0.083% IN NEBU
2.5000 mg | INHALATION_SOLUTION | RESPIRATORY_TRACT | Status: DC
  Administered 2015-08-05: 2.5 mg via RESPIRATORY_TRACT
  Filled 2015-08-05: qty 3

## 2015-08-05 MED ORDER — CEFAZOLIN SODIUM-DEXTROSE 2-3 GM-% IV SOLR
2.0000 g | Freq: Three times a day (TID) | INTRAVENOUS | Status: DC
  Administered 2015-08-05 – 2015-08-06 (×3): 2 g via INTRAVENOUS
  Filled 2015-08-05 (×5): qty 50

## 2015-08-05 MED ORDER — HYDROMORPHONE HCL 2 MG PO TABS
4.0000 mg | ORAL_TABLET | ORAL | Status: DC | PRN
Start: 2015-08-05 — End: 2015-08-06
  Administered 2015-08-05: 4 mg via ORAL
  Filled 2015-08-05: qty 2

## 2015-08-05 MED ORDER — ALBUTEROL SULFATE (2.5 MG/3ML) 0.083% IN NEBU
2.5000 mg | INHALATION_SOLUTION | Freq: Four times a day (QID) | RESPIRATORY_TRACT | Status: DC
  Administered 2015-08-05 – 2015-08-09 (×13): 2.5 mg via RESPIRATORY_TRACT
  Filled 2015-08-05 (×17): qty 3

## 2015-08-05 MED ORDER — SODIUM CHLORIDE 0.9 % IV SOLN
INTRAVENOUS | Status: DC
  Administered 2015-08-07: 07:00:00 via INTRAVENOUS
  Administered 2015-08-08: 1000 mL via INTRAVENOUS
  Administered 2015-08-11: 22:00:00 via INTRAVENOUS

## 2015-08-05 MED ORDER — PANTOPRAZOLE SODIUM 40 MG IV SOLR
40.0000 mg | Freq: Every day | INTRAVENOUS | Status: DC
  Administered 2015-08-05: 40 mg via INTRAVENOUS
  Filled 2015-08-05: qty 40

## 2015-08-05 MED ORDER — CHLORHEXIDINE GLUCONATE 0.12% ORAL RINSE (MEDLINE KIT)
15.0000 mL | Freq: Two times a day (BID) | OROMUCOSAL | Status: DC
  Administered 2015-08-05 – 2015-08-12 (×11): 15 mL via OROMUCOSAL

## 2015-08-05 MED ORDER — LIDOCAINE VISCOUS 2 % MT SOLN
20.0000 mL | Freq: Four times a day (QID) | OROMUCOSAL | Status: DC | PRN
  Filled 2015-08-05: qty 20

## 2015-08-05 NOTE — Progress Notes (Signed)
VASCULAR LAB PRELIMINARY  PRELIMINARY  PRELIMINARY  PRELIMINARY  Bilateral lower extremity venous duplex completed.    Preliminary report:  There is no DVT or SVT noted in the bilateral lower extremities.   Glena Pharris, RVT 08/05/2015, 12:45 PM

## 2015-08-05 NOTE — Progress Notes (Signed)
RT reported to RN and MD the CO2 result from the ABG of 61.1. Pt is currently on a venti mask. RT will continue to monitor.

## 2015-08-05 NOTE — Progress Notes (Signed)
  Echocardiogram 2D Echocardiogram has been performed.  Shane Cantu 08/05/2015, 10:11 AM

## 2015-08-05 NOTE — Progress Notes (Signed)
Placed pt on 55% VM for SpO2 decreasing into 80s while sleeping. Pt refuses to wear CPAP. Pt states he does not wear one at home, he could not tolerate it.

## 2015-08-05 NOTE — Progress Notes (Signed)
PT Cancellation Note  Patient Details Name: JEWETT MCGANN MRN: [id number] DOB: [birth year]   Cancelled Treatment:    Reason Eval/Treat Not Completed: Medical issues which prohibited therapy   Holding PT today per Dr. Garen Lah request; Going to MRI later today to assess questionable discitis;   Will follow up tomorrow for appropriateness of PT eval;   Roney Marion, [place] Pager [phone] Office [phone]    Roney Marion [facility] 08/05/2015, 4:24 PM

## 2015-08-05 NOTE — Consult Note (Addendum)
[place] for Infectious Disease    Date of Admission:  08/04/2015  Date of Consult:  08/05/2015  Reason for Consult: Staphylococcus aureus bacteremia with sepsis Referring Physician: "auto consult from [facility]" and Dr. Thereasa Solo   HPI: Shane Cantu is an 59 y.o. 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 He also has developed a nondisplaced right ankle fracture in July. He recently did not discharge the hospital on August 23 after being hospitalized for intractable nausea and dehydration in the setting of chemotherapy.  He came to the hospital now with shortness of breath was found to be febrile and tachycardic, hypoxemic. He was hypotensive and was requiring at least 3 L of normal saline fluid boluses. Blood cultures at that obtained and he was started on broad-spectrum anabiotic's.  CT angiogram of the lungs done to look for pulmonary ligament showed a multifocal pneumonia.  Since then his blood cultures have come back with Staphylococcus aureus sensitivities are pending.  Impression the patient he is continued complaint of severe neck pain and also lower back pain. He had prior lumbar surgery at the site of his pain in his lower back. His right ankle is not especially hurting him and he has not many other new complaints besides the neck pain back pain nausea and dyspnea.   Past Medical History  Diagnosis Date  . Chronic pain   . Back pain   . Chronic neck pain   . History of kidney stones   . Allergic rhinitis   . Hypertension   . Migraine headache without aura   . Sleep apnea     does not use cpap  . GERD (gastroesophageal reflux disease)   . Neuropathy   . Arthritis   . Cancer      may 2016 tongue cancer  . Bacterial conjunctivitis of left eye 07/26/2015    Past Surgical History  Procedure Laterality Date  . Back surgery  03/1993,  10/2002    Dr. Louanne Skye  . Tonsillectomy      as a child  . Neck surgery  2004    Dr. Louanne Skye  . Lymph node biopsy    . Nasal sinusotomy  1977  . Colonoscopy    . Multiple extractions with alveoloplasty N/A 04/26/2015    Procedure: Extraction of tooth #'s 6,11,12,15,20,21,22,27,28 with alveoloplasty;  Surgeon: [name], DDS;  Location: [place];  Service: Oral Surgery;  Laterality: N/A;  ergies:   Allergies  Allergen Reactions  . Morphine And Related Anaphylaxis     Medications: I have reviewed patients current medications as documented in Epic Anti-infectives    Start     Dose/Rate Route Frequency Ordered Stop   08/05/15 1400  ceFAZolin (ANCEF) IVPB 2 g/50 mL premix     2 g 100 mL/hr over 30 Minutes Intravenous 3 times per day 08/05/15 1044     08/04/15 1800  vancomycin (VANCOCIN) IVPB 750 mg/150 ml premix     750 mg 150 mL/hr over 60 Minutes Intravenous Every 12 hours 08/04/15 0731     08/04/15 1200  piperacillin-tazobactam (ZOSYN) IVPB 3.375 g  Status:  Discontinued     3.375 g 12.5 mL/hr over 240 Minutes Intravenous 3 times per day 08/04/15 0731 08/05/15 1037   08/04/15 0315  piperacillin-tazobactam (ZOSYN) IVPB 3.375 g     3.375 g 100  mL/hr over 30 Minutes Intravenous  Once 08/04/15 0300 08/04/15 0404   08/04/15 0315  vancomycin (VANCOCIN) IVPB 1000 mg/200 mL premix  Status:  Discontinued     1,000 mg 200 mL/hr over 60 Minutes Intravenous  Once 08/04/15 0300 08/04/15 0309   08/04/15 0315  vancomycin (VANCOCIN) 1,500 mg in sodium chloride 0.9 % 500 mL IVPB     1,500 mg 250 mL/hr over 120 Minutes Intravenous  Once 08/04/15 0309 08/04/15 0543      Social History:  reports that he has never smoked. He has never used smokeless tobacco. He reports that he drinks alcohol. He reports that he does not use illicit drugs.   Family History  Problem Relation Age of Onset  . Cancer Father     throat ca  . Cancer Brother     pituitary ca  . Heart disease Mother    No hx of  hearing loss from abx   ROS: Pertinent for significant severe neck pain where he has previously had cervical surgery done by Dr. Lavetta Nielsen, also severe lower back pain nausea fevers and shortness of breath. Otherwise remainder of 12 point review of systems is negative.   Blood pressure 130/65, pulse 78, temperature 98.9 F (37.2 C), temperature source Oral, resp. rate 14, height $RemoveBe'5\' 9"'TZoKmEmwr$  (1.753 m), weight 230 lb 2.6 oz (104.4 kg), SpO2 97 %. General: Alert and awake, oriented x3, not in any acute distress. HEENT: anicteric sclera,  EOMI, oropharynx clear and without exudate CVS regular rate, normal r,  no murmur rubs or gallops Chest: decreased Bs at bases.  no wheezing Abdomen: soft nontender, nondistended, normal bowel sounds, Extremities: Right ankle warm but not especially swollen not tender  Skin: porta cath site not overtly infected    Neuro: nonfocal, strength and sensation intact   Results for orders placed or performed during the hospital encounter of 08/04/15 (from the past 48 hour(s))  Comprehensive metabolic panel     Status: Abnormal   Collection Time: 08/04/15  3:27 AM  Result Value Ref Range   Sodium 135 135 - 145 mmol/L   Potassium 4.1 3.5 - 5.1 mmol/L   Chloride 97 (L) 101 - 111 mmol/L   CO2 29 22 - 32 mmol/L   Glucose, Bld 201 (H) 65 - 99 mg/dL   BUN 13 6 - 20 mg/dL   Creatinine, Ser 1.30 (H) 0.61 - 1.24 mg/dL   Calcium 8.7 (L) 8.9 - 10.3 mg/dL   Total Protein 6.5 6.5 - 8.1 g/dL   Albumin 3.1 (L) 3.5 - 5.0 g/dL   AST 21 15 - 41 U/L   ALT 14 (L) 17 - 63 U/L   Alkaline Phosphatase 90 38 - 126 U/L   Total Bilirubin 0.6 0.3 - 1.2 mg/dL   GFR calc non Af Amer 58 (L) >60 mL/min   GFR calc Af Amer >60 >60 mL/min    Comment: (NOTE) The eGFR has been calculated using the CKD EPI equation. This calculation has not been validated in all clinical situations. eGFR's persistently <60 mL/min signify possible Chronic Kidney Disease.    Anion gap 9 5 - 15  CBC WITH  DIFFERENTIAL     Status: Abnormal   Collection Time: 08/04/15  3:27 AM  Result Value Ref Range   WBC 9.5 4.0 - 10.5 K/uL   RBC 3.14 (L) 4.22 - 5.81 MIL/uL   Hemoglobin 9.5 (L) 13.0 - 17.0 g/dL   HCT 29.4 (L) 39.0 - 52.0 %   MCV 93.6  78.0 - 100.0 fL   MCH 30.3 26.0 - 34.0 pg   MCHC 32.3 30.0 - 36.0 g/dL   RDW 17.6 (H) 11.5 - 15.5 %   Platelets 171 150 - 400 K/uL   Neutrophils Relative % 93 (H) 43 - 77 %   Neutro Abs 8.9 (H) 1.7 - 7.7 K/uL   Lymphocytes Relative 3 (L) 12 - 46 %   Lymphs Abs 0.3 (L) 0.7 - 4.0 K/uL   Monocytes Relative 4 3 - 12 %   Monocytes Absolute 0.4 0.1 - 1.0 K/uL   Eosinophils Relative 0 0 - 5 %   Eosinophils Absolute 0.0 0.0 - 0.7 K/uL   Basophils Relative 0 0 - 1 %   Basophils Absolute 0.0 0.0 - 0.1 K/uL  Blood Culture (routine x 2)     Status: None (Preliminary result)   Collection Time: 08/04/15  3:27 AM  Result Value Ref Range   Specimen Description BLOOD LEFT FOREARM    Special Requests BOTTLES DRAWN AEROBIC AND ANAEROBIC 10CC    Culture  Setup Time      AEROBIC BOTTLE ONLY GRAM POSITIVE COCCI IN CLUSTERS CRITICAL RESULT CALLED TO, READ BACK BY AND VERIFIED WITH: S.KHOBHAL,RN 2221 08/04/15 M.CAMPBELL CONFIRMED BY R.GREENE    Culture STAPHYLOCOCCUS AUREUS    Report Status PENDING   I-stat troponin, ED     Status: Abnormal   Collection Time: 08/04/15  3:29 AM  Result Value Ref Range   Troponin i, poc 0.19 (HH) 0.00 - 0.08 ng/mL   Comment NOTIFIED PHYSICIAN    Comment 3            Comment: Due to the release kinetics of cTnI, a negative result within the first hours of the onset of symptoms does not rule out myocardial infarction with certainty. If myocardial infarction is still suspected, repeat the test at appropriate intervals.   I-Stat CG4 Lactic Acid, ED  (not at  [facility])     Status: None   Collection Time: 08/04/15  3:31 AM  Result Value Ref Range   Lactic Acid, Venous 1.34 0.5 - 2.0 mmol/L  Troponin I     Status: Abnormal   Collection  Time: 08/04/15  3:36 AM  Result Value Ref Range   Troponin I 0.20 (H) <0.031 ng/mL    Comment:        PERSISTENTLY INCREASED TROPONIN VALUES IN THE RANGE OF 0.04-0.49 ng/mL CAN BE SEEN IN:       -UNSTABLE ANGINA       -CONGESTIVE HEART FAILURE       -MYOCARDITIS       -CHEST TRAUMA       -ARRYHTHMIAS       -LATE PRESENTING MYOCARDIAL INFARCTION       -COPD   CLINICAL FOLLOW-UP RECOMMENDED.   Urinalysis, Routine w reflex microscopic (not at [facility])     Status: None   Collection Time: 08/04/15  3:49 AM  Result Value Ref Range   Color, Urine YELLOW YELLOW   APPearance CLEAR CLEAR   Specific Gravity, Urine 1.012 1.005 - 1.030   pH 6.0 5.0 - 8.0   Glucose, UA NEGATIVE NEGATIVE mg/dL   Hgb urine dipstick NEGATIVE NEGATIVE   Bilirubin Urine NEGATIVE NEGATIVE   Ketones, ur NEGATIVE NEGATIVE mg/dL   Protein, ur NEGATIVE NEGATIVE mg/dL   Urobilinogen, UA 0.2 0.0 - 1.0 mg/dL   Nitrite NEGATIVE NEGATIVE   Leukocytes, UA NEGATIVE NEGATIVE    Comment: MICROSCOPIC NOT DONE ON URINES WITH  NEGATIVE PROTEIN, BLOOD, LEUKOCYTES, NITRITE, OR GLUCOSE <1000 mg/dL.  Urine culture     Status: None   Collection Time: 08/04/15  3:49 AM  Result Value Ref Range   Specimen Description URINE, CLEAN CATCH    Special Requests NONE    Culture 2,000 COLONIES/mL INSIGNIFICANT GROWTH    Report Status 08/05/2015 FINAL   I-Stat CG4 Lactic Acid, ED  (not at  [facility])     Status: None   Collection Time: 08/04/15  6:11 AM  Result Value Ref Range   Lactic Acid, Venous 1.64 0.5 - 2.0 mmol/L  Basic metabolic panel     Status: Abnormal   Collection Time: 08/04/15  7:40 AM  Result Value Ref Range   Sodium 135 135 - 145 mmol/L   Potassium 4.3 3.5 - 5.1 mmol/L   Chloride 104 101 - 111 mmol/L   CO2 28 22 - 32 mmol/L   Glucose, Bld 167 (H) 65 - 99 mg/dL   BUN 13 6 - 20 mg/dL   Creatinine, Ser 1.21 0.61 - 1.24 mg/dL   Calcium 7.7 (L) 8.9 - 10.3 mg/dL   GFR calc non Af Amer >60 >60 mL/min   GFR calc Af Amer >60 >60  mL/min    Comment: (NOTE) The eGFR has been calculated using the CKD EPI equation. This calculation has not been validated in all clinical situations. eGFR's persistently <60 mL/min signify possible Chronic Kidney Disease.    Anion gap 3 (L) 5 - 15  CBC     Status: Abnormal   Collection Time: 08/04/15  7:40 AM  Result Value Ref Range   WBC 8.8 4.0 - 10.5 K/uL   RBC 2.68 (L) 4.22 - 5.81 MIL/uL   Hemoglobin 7.8 (L) 13.0 - 17.0 g/dL   HCT 25.1 (L) 39.0 - 52.0 %   MCV 93.7 78.0 - 100.0 fL   MCH 29.1 26.0 - 34.0 pg   MCHC 31.1 30.0 - 36.0 g/dL   RDW 17.7 (H) 11.5 - 15.5 %   Platelets 141 (L) 150 - 400 K/uL  Cortisol     Status: None   Collection Time: 08/04/15  7:40 AM  Result Value Ref Range   Cortisol, Plasma 12.8 ug/dL    Comment: (NOTE) AM    6.7 - 22.6 ug/dL PM   <10.0       ug/dL   Procalcitonin - Baseline     Status: None   Collection Time: 08/04/15  7:40 AM  Result Value Ref Range   Procalcitonin 4.01 ng/mL    Comment:        Interpretation: PCT > 2 ng/mL: Systemic infection (sepsis) is likely, unless other causes are known. (NOTE)         ICU PCT Algorithm               Non ICU PCT Algorithm    ----------------------------     ------------------------------         PCT < 0.25 ng/mL                 PCT < 0.1 ng/mL     Stopping of antibiotics            Stopping of antibiotics       strongly encouraged.               strongly encouraged.    ----------------------------     ------------------------------       PCT level decrease by  PCT < 0.25 ng/mL       >= 80% from peak PCT       OR PCT 0.25 - 0.5 ng/mL          Stopping of antibiotics                                             encouraged.     Stopping of antibiotics           encouraged.    ----------------------------     ------------------------------       PCT level decrease by              PCT >= 0.25 ng/mL       < 80% from peak PCT        AND PCT >= 0.5 ng/mL            Continuing  antibiotics                                               encouraged.       Continuing antibiotics            encouraged.    ----------------------------     ------------------------------     PCT level increase compared          PCT > 0.5 ng/mL         with peak PCT AND          PCT >= 0.5 ng/mL             Escalation of antibiotics                                          strongly encouraged.      Escalation of antibiotics        strongly encouraged.   MRSA PCR Screening     Status: Abnormal   Collection Time: 08/04/15 10:02 AM  Result Value Ref Range   MRSA by PCR POSITIVE (A) NEGATIVE    Comment:        The GeneXpert MRSA Assay (FDA approved for NASAL specimens only), is one component of a comprehensive MRSA colonization surveillance program. It is not intended to diagnose MRSA infection nor to guide or monitor treatment for MRSA infections. RESULT CALLED TO, READ BACK BY AND VERIFIED WITH: NOTIFIED M. LESSARD RN 08/04/15 AT 1139 BY A. DAVIS   Strep pneumoniae urinary antigen  (not at [facility])     Status: None   Collection Time: 08/04/15 12:55 PM  Result Value Ref Range   Strep Pneumo Urinary Antigen NEGATIVE NEGATIVE    Comment:        Infection due to S. pneumoniae cannot be absolutely ruled out since the antigen present may be below the detection limit of the test.   Troponin I (q 6hr x 3)     Status: Abnormal   Collection Time: 08/04/15 12:57 PM  Result Value Ref Range   Troponin I 0.36 (H) <0.031 ng/mL    Comment:        PERSISTENTLY INCREASED TROPONIN VALUES IN THE RANGE OF 0.04-0.49 ng/mL CAN BE SEEN IN:       -  UNSTABLE ANGINA       -CONGESTIVE HEART FAILURE       -MYOCARDITIS       -CHEST TRAUMA       -ARRYHTHMIAS       -LATE PRESENTING MYOCARDIAL INFARCTION       -COPD   CLINICAL FOLLOW-UP RECOMMENDED.   HIV antibody     Status: None   Collection Time: 08/04/15 12:57 PM  Result Value Ref Range   HIV Screen 4th Generation wRfx Non Reactive Non  Reactive    Comment: (NOTE) Performed At: [facility] [place], [place] [id number] Lindon Romp MD LE:[id number]   Glucose, capillary     Status: Abnormal   Collection Time: 08/04/15  1:00 PM  Result Value Ref Range   Glucose-Capillary 107 (H) 65 - 99 mg/dL  Lactic acid, plasma     Status: None   Collection Time: 08/04/15  1:20 PM  Result Value Ref Range   Lactic Acid, Venous 0.9 0.5 - 2.0 mmol/L  Glucose, capillary     Status: Abnormal   Collection Time: 08/04/15  3:30 PM  Result Value Ref Range   Glucose-Capillary 125 (H) 65 - 99 mg/dL  Troponin I (q 6hr x 3)     Status: Abnormal   Collection Time: 08/04/15  6:21 PM  Result Value Ref Range   Troponin I 0.34 (H) <0.031 ng/mL    Comment:        PERSISTENTLY INCREASED TROPONIN VALUES IN THE RANGE OF 0.04-0.49 ng/mL CAN BE SEEN IN:       -UNSTABLE ANGINA       -CONGESTIVE HEART FAILURE       -MYOCARDITIS       -CHEST TRAUMA       -ARRYHTHMIAS       -LATE PRESENTING MYOCARDIAL INFARCTION       -COPD   CLINICAL FOLLOW-UP RECOMMENDED.   CBC     Status: Abnormal   Collection Time: 08/04/15  6:21 PM  Result Value Ref Range   WBC 7.3 4.0 - 10.5 K/uL   RBC 2.55 (L) 4.22 - 5.81 MIL/uL   Hemoglobin 7.6 (L) 13.0 - 17.0 g/dL   HCT 23.9 (L) 39.0 - 52.0 %   MCV 93.7 78.0 - 100.0 fL   MCH 29.8 26.0 - 34.0 pg   MCHC 31.8 30.0 - 36.0 g/dL   RDW 17.6 (H) 11.5 - 15.5 %   Platelets 165 150 - 400 K/uL  Glucose, capillary     Status: Abnormal   Collection Time: 08/04/15  9:59 PM  Result Value Ref Range   Glucose-Capillary 101 (H) 65 - 99 mg/dL  Troponin I (q 6hr x 3)     Status: Abnormal   Collection Time: 08/04/15 11:33 PM  Result Value Ref Range   Troponin I 0.28 (H) <0.031 ng/mL    Comment:        PERSISTENTLY INCREASED TROPONIN VALUES IN THE RANGE OF 0.04-0.49 ng/mL CAN BE SEEN IN:       -UNSTABLE ANGINA       -CONGESTIVE HEART FAILURE       -MYOCARDITIS       -CHEST TRAUMA       -ARRYHTHMIAS        -LATE PRESENTING MYOCARDIAL INFARCTION       -COPD   CLINICAL FOLLOW-UP RECOMMENDED.   Magnesium     Status: Abnormal   Collection Time: 08/05/15  6:43 AM  Result Value Ref Range   Magnesium  1.4 (L) 1.7 - 2.4 mg/dL  Glucose, capillary     Status: Abnormal   Collection Time: 08/05/15  8:01 AM  Result Value Ref Range   Glucose-Capillary 106 (H) 65 - 99 mg/dL   '@BRIEFLABTABLE'$ (sdes,specrequest,cult,reptstatus)   ) Recent Results (from the past 720 hour(s))  Culture, expectorated sputum-assessment     Status: None   Collection Time: 07/23/15  6:38 AM  Result Value Ref Range Status   Specimen Description SPUTUM  Final   Special Requests NONE  Final   Sputum evaluation   Final    THIS SPECIMEN IS ACCEPTABLE. RESPIRATORY CULTURE REPORT TO FOLLOW.   Report Status 07/23/2015 FINAL  Final  Culture, respiratory (NON-Expectorated)     Status: None   Collection Time: 07/23/15  6:38 AM  Result Value Ref Range Status   Specimen Description SPUTUM  Final   Special Requests NONE  Final   Gram Stain   Final    NO WBC SEEN FEW SQUAMOUS EPITHELIAL CELLS PRESENT FEW GRAM POSITIVE COCCI IN CLUSTERS Performed at [facility]    Culture   Final    MODERATE METHICILLIN RESISTANT STAPHYLOCOCCUS AUREUS Note: RIFAMPIN AND GENTAMICIN SHOULD NOT BE USED AS SINGLE DRUGS FOR TREATMENT OF STAPH INFECTIONS. This organism DOES NOT demonstrate inducible Clindamycin resistance in vitro. Performed at [facility]    Report Status 07/26/2015 FINAL  Final   Organism ID, Bacteria METHICILLIN RESISTANT STAPHYLOCOCCUS AUREUS  Final      Susceptibility   Methicillin resistant staphylococcus aureus - MIC*    CLINDAMYCIN <=0.25 SENSITIVE Sensitive     ERYTHROMYCIN >=8 RESISTANT Resistant     GENTAMICIN <=0.5 SENSITIVE Sensitive     LEVOFLOXACIN 4 INTERMEDIATE Intermediate     OXACILLIN >=4 RESISTANT Resistant     RIFAMPIN <=0.5 SENSITIVE Sensitive     TRIMETH/SULFA <=10 SENSITIVE Sensitive       VANCOMYCIN 1 SENSITIVE Sensitive     TETRACYCLINE <=1 SENSITIVE Sensitive     * MODERATE METHICILLIN RESISTANT STAPHYLOCOCCUS AUREUS  Blood Culture (routine x 2)     Status: None (Preliminary result)   Collection Time: 08/04/15  3:27 AM  Result Value Ref Range Status   Specimen Description BLOOD LEFT FOREARM  Final   Special Requests BOTTLES DRAWN AEROBIC AND ANAEROBIC 10CC  Final   Culture  Setup Time   Final    AEROBIC BOTTLE ONLY GRAM POSITIVE COCCI IN CLUSTERS CRITICAL RESULT CALLED TO, READ BACK BY AND VERIFIED WITH: [place] 2221 08/04/15 M.CAMPBELL CONFIRMED BY R.GREENE    Culture STAPHYLOCOCCUS AUREUS  Final   Report Status PENDING  Incomplete  Urine culture     Status: None   Collection Time: 08/04/15  3:49 AM  Result Value Ref Range Status   Specimen Description URINE, CLEAN CATCH  Final   Special Requests NONE  Final   Culture 2,000 COLONIES/mL INSIGNIFICANT GROWTH  Final   Report Status 08/05/2015 FINAL  Final  MRSA PCR Screening     Status: Abnormal   Collection Time: 08/04/15 10:02 AM  Result Value Ref Range Status   MRSA by PCR POSITIVE (A) NEGATIVE Final    Comment:        The GeneXpert MRSA Assay (FDA approved for NASAL specimens only), is one component of a comprehensive MRSA colonization surveillance program. It is not intended to diagnose MRSA infection nor to guide or monitor treatment for MRSA infections. RESULT CALLED TO, READ BACK BY AND VERIFIED WITH: NOTIFIED M. LESSARD RN 08/04/15 AT 1139 BY A.  DAVIS      Impression/Recommendation  Principal Problem:   Sepsis Active Problems:   Tongue cancer   Chronic neck pain   Anemia due to antineoplastic chemotherapy   Chemotherapy-induced nausea   HCAP (healthcare-associated pneumonia)   Acute respiratory failure with hypoxia   Elevated troponin   Acute hyperglycemia   RBBB   OSA (obstructive sleep apnea)   Closed right ankle fracture   HTN (hypertension)   QT prolongation   Shane Harmsen  Cantu is a 59 y.o. 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 He has an indwelling Port-A-Cath will need to be removed he also has severe lower back pain and has had a recent fracture his right ankle.  #1 Severe sepsis due to Staphylococcus aureus bacteremia with multifocal pneumonia       [place] Antimicrobial Management Team Staphylococcus aureus bacteremia   Staphylococcus aureus bacteremia (SAB) is associated with a high rate of complications and mortality.  Specific aspects of clinical management are critical to optimizing the outcome of patients with SAB.  Therefore, the [facility] Antimicrobial Management Team [facility]) has initiated an intervention aimed at improving the management of SAB at [facility].  To do so, Infectious Diseases physicians are providing an evidence-based consult for the management of all patients with SAB.     Yes No Comments  Perform follow-up blood cultures (even if the patient is afebrile) to ensure clearance of bacteremia $RemoveBefor'[x]'ThkXlQGueslN$'[]'$   repeat blood cultures ordered   Remove vascular catheter and obtain follow-up blood cultures after the removal of the catheter $RemoveBefo'[x]'jVotLUSFSXz$'[]'$   Port-A-Cath has to come out and conveyed this to Dr. Thereasa Solo   Perform echocardiography to evaluate for endocarditis (transthoracic ECHO is 40-50% sensitive, TEE is > 90% sensitive) $RemoveBefore'[]'XcAYHRzfppjEh$'[]'$  Please keep in mind, that neither test can definitively EXCLUDE endocarditis, and that should clinical suspicion remain high for endocarditis the patient should then still be treated with an "endocarditis" duration of therapy = 6 weeks  2-D echocardiogram done and no vegetation seen on 2-D echo TEE may be, gated given his history of tongue cancer   Consult electrophysiologist to evaluate implanted cardiac device (pacemaker, ICD) $RemoveBeforeDE'[]'jiesjzTryGmeHGP$'[]'$   not applicable   Ensure source control $RemoveBefore'[]'IQcMmBVSUjmoe$   '[]'$  Have all abscesses been drained effectively? Have deep seeded infections (septic joints or osteomyelitis) had appropriate surgical debridement?  Port a cath must come out   Investigate for "metastatic" sites of infection $RemoveBefo'[]'CbfXLGrReGH$'[]'$  Does the patient have ANY symptom or physical exam finding that would suggest a deeper infection (back or neck pain that may be suggestive of vertebral osteomyelitis or epidural abscess, muscle pain that could be a symptom of pyomyositis)?  Keep in mind that for deep seeded infections MRI imaging with contrast is preferred rather than other often insensitive tests such as plain x-rays, especially early in a patient's presentation.  He needs an MRI of his neck and lumbar spine he may also need his right ankle looked at as well more carefully   Change antibiotic therapy to vancomycin and cefazolin  $RemoveBe'[]'bzcMacvcF$'[]'$  Beta-lactam antibiotics are preferred for MSSA due to higher cure rates.   If on Vancomycin, goal trough should be 15 - 20 mcg/mL  Estimated duration of IV antibiotic therapy: 6-8 weeks   '[]'$  $R'[]'qm$  Consult case management for probably prolonged outpatient IV antibiotic therapy   #2 Screening: check HIV  and HCV if not recently checked   08/05/2015, 1:39 PM   Thank you so much for this interesting consult  [place] for [place] [phone] (pager) [phone] (office) 08/05/2015, 1:39 PM  Rhina [place] 08/05/2015, 1:39 PM

## 2015-08-05 NOTE — Progress Notes (Signed)
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 He was seen by his Oncologist on 9/2 with no significant issues other than pain management, and he was told a recent PET noted improvement in his cancer. He presented to the ER after awakening during the night with shortness of breath and chest pressure.   In the ED his temperature was 101.6, his heart rate was 131 bpm sinus tachycardia, room air saturations 66%. Repeat temperature was 102F rectal.  Chest x-ray revealed a right basilar airspace opacity concerning for pneumonia. CT angiogram of the chest was without findings of pulmonary embolism but did suggest pneumonia.   HPI/Subjective: At the time of my visit the patient was essentially obtunded.  He was sleeping.  His respiratory mechanics were very poor and his work of breathing was high.  He was taking very shallow breaths and moving very little air.  His RN confirmed that he had not been this [place] even as early as 30-40 minutes beforehand.  I requested a stat ABG and ordered empiric BiPAP to begin.  I discussed the case with PCCM who visited the patient on the unit with me and felt the most appropriate step would be to proceed immediately to intubation.  While we were awaiting transfer to an ICU bed respiratory therapy proceeded with CABG during which the patient did not respond.  After the ABG was accomplished, but before any attempts were made to intubate the patient, he unexpectedly and  suddenly awoke.  With this his respirations returned to normal and there is no objective evidence of respiratory compromise whatsoever.  Assessment/Plan:  Severe sepsis due to Staph aureus bacteremia w/ multifocal PNA Continue current antibiotic regimen as suggested by ID - full endocarditis evaluation will be required to include MRI of spine - hold on further imaging today until respiratory status proves to be more stable  Acute hypoxic respiratory failure due to HCAP - acute hypercarbic respiratory failure due to sleep apnea Utilize CPAP or BiPAP at all times when asleep - monitor closely with supplemental oxygen  Chronic neck and low back pain This may simply represent his chronic pain but it could also be related to staph aureus discitis, spinal osteomyelitis, or even epidural abscess - neurologically he is intact at present - MRI will be required to further evaluate but I have postpone this for today due to the above described acute respiratory distress  Elevated troponin Likely simply stress induced - appears to have peaked at 0.38 - follow trend  Tongue cancer As per oncology - was last seen in the office 08/03/15  Anemia due to chemotherapy Follow hemoglobin trend - keep hemoglobin 7.0 or greater   Hyperglycemia check A1c  Hypomagnesemia Replace and follow  Severe obstructive sleep apnea utilize CPAP or BiPAP at all times when patient is sleeping and daily at bedtime   Code Status: FULL Family Communication: Spoke with daughter at bedside Disposition Plan: SDU  Consultants: ID PCCM  Procedures: 9/4 LE dopplers - no DVT  Antibiotics: Cefazolin 9/4 > Vancomycin 9/3 >  DVT prophylaxis: Lovenox  Objective: Blood pressure 130/65, pulse 78, temperature 98.9 F (37.2 C), temperature source Oral, resp. rate 14, height 5\' 9"  (1.753 m), weight 104.4 kg (230 lb 2.6 oz), SpO2 97 %.  Intake/Output Summary (Last 24 hours) at 08/05/15 1527 Last data filed at 08/05/15  1100  Gross per 24 hour  Intake   3550 ml  Output   4150 ml  Net   -600 ml   Exam: General: Extremely poor respiratory mechanics  - appears to be in extremis  - very minimal airflow with weak respiratory effort - obtunded  - does not arouse to very deep sternal rub  Lungs: very poor air movement throughout all fields with no appreciable wheeze or crackles  Cardiovascular: Regular rate and rhythm without murmur gallop or rub normal S1 and S2 Abdomen: Nontender, protuberant, soft, bowel sounds positive, no rebound, no ascites, no appreciable mass Extremities: No significant cyanosis, clubbing, or edema bilateral lower extremities  Data Reviewed: Basic Metabolic Panel:  Recent Labs Lab 08/03/15 1034 08/04/15 0327 08/04/15 0740 08/05/15 0643  NA 139 135 135  --   K 4.5 4.1 4.3  --   CL  --  97* 104  --   CO2 29 29 28   --   GLUCOSE 173* 201* 167*  --   BUN 10.4 13 13   --   CREATININE 1.2 1.30* 1.21  --   CALCIUM 9.3 8.7* 7.7*  --   MG 1.6  --   --  1.4*    CBC:  Recent Labs Lab 08/03/15 1034 08/04/15 0327 08/04/15 0740 08/04/15 1821  WBC 8.7 9.5 8.8 7.3  NEUTROABS 7.9* 8.9*  --   --   HGB 9.9* 9.5* 7.8* 7.6*  HCT 31.2* 29.4* 25.1* 23.9*  MCV 92.6 93.6 93.7 93.7  PLT 161 171 141* 165    Liver Function Tests:  Recent Labs Lab 08/03/15 1034 08/04/15 0327  AST 17 21  ALT 15 14*  ALKPHOS 110 90  BILITOT 0.40 0.6  PROT 7.1 6.5  ALBUMIN 3.2* 3.1*   Cardiac Enzymes:  Recent Labs Lab 08/04/15 0336 08/04/15 1257 08/04/15 1821 08/04/15 2333  TROPONINI 0.20* 0.36* 0.34* 0.28*    CBG:  Recent Labs Lab 08/02/15 1255 08/04/15 1300 08/04/15 1530 08/04/15 2159 08/05/15 0801  GLUCAP 108* 107* 125* 101* 106*    Recent Results (from the past 240 hour(s))  Blood Culture (routine x 2)     Status: None (Preliminary result)   Collection Time: 08/04/15  3:27 AM  Result Value Ref Range Status   Specimen Description BLOOD LEFT FOREARM  Final   Special  Requests BOTTLES DRAWN AEROBIC AND ANAEROBIC 10CC  Final   Culture  Setup Time   Final    AEROBIC BOTTLE ONLY GRAM POSITIVE COCCI IN CLUSTERS CRITICAL RESULT CALLED TO, READ BACK BY AND VERIFIED WITH: [place] 2221 08/04/15 M.CAMPBELL CONFIRMED BY R.GREENE    Culture STAPHYLOCOCCUS AUREUS  Final   Report Status PENDING  Incomplete  Blood Culture (routine x 2)     Status: None (Preliminary result)   Collection Time: 08/04/15  3:27 AM  Result Value Ref Range Status   Specimen Description BLOOD NECK  Final   Special Requests BOTTLES DRAWN AEROBIC AND ANAEROBIC 5CC  Final   Culture NO GROWTH 1 DAY  Final   Report Status PENDING  Incomplete  Urine culture     Status: None   Collection Time: 08/04/15  3:49 AM  Result Value Ref Range Status   Specimen Description URINE,  CLEAN CATCH  Final   Special Requests NONE  Final   Culture 2,000 COLONIES/mL INSIGNIFICANT GROWTH  Final   Report Status 08/05/2015 FINAL  Final  MRSA PCR Screening     Status: Abnormal   Collection Time: 08/04/15 10:02 AM  Result Value Ref Range Status   MRSA by PCR POSITIVE (A) NEGATIVE Final    Comment:        The GeneXpert MRSA Assay (FDA approved for NASAL specimens only), is one component of a comprehensive MRSA colonization surveillance program. It is not intended to diagnose MRSA infection nor to guide or monitor treatment for MRSA infections. RESULT CALLED TO, READ BACK BY AND VERIFIED WITH: NOTIFIED M. LESSARD RN 08/04/15 AT 1139 BY A. DAVIS      Studies:   Recent x-ray studies have been reviewed in detail by the Attending Physician  Scheduled Meds:  Scheduled Meds: . antiseptic oral rinse  7 mL Mouth Rinse q12n4p  . aspirin EC  81 mg Oral Daily  .  ceFAZolin (ANCEF) IV  2 g Intravenous 3 times per day  . chlorhexidine  15 mL Mouth Rinse BID  . Chlorhexidine Gluconate Cloth  6 each Topical Q0600  . enoxaparin (LOVENOX) injection  40 mg Subcutaneous Q24H  . feeding supplement (ENSURE ENLIVE)   237 mL Oral BID BM  . fentaNYL  50 mcg Transdermal Q72H  . fluticasone  2 spray Each Nare Daily  . gabapentin  600 mg Oral BID  . guaiFENesin  600 mg Oral BID  . insulin aspart  0-5 Units Subcutaneous QHS  . insulin aspart  0-9 Units Subcutaneous TID WC  . magnesium oxide  400 mg Oral Daily  . mupirocin ointment  1 application Nasal BID  . scopolamine  1 patch Transdermal Q72H  . tobramycin  1 drop Left Eye 6 times per day  . vancomycin  750 mg Intravenous Q12H    Time spent on care of this patient: 35 mins   Aldine Grainger T , MD   [facility] Office  [phone] Pager - Text Page per Shea Evans as per below:  On-Call/Text Page:      [email or web address]      password [email or web address]  If 7PM-7AM, please contact night-coverage [email or web address] Password [email or web address] 08/05/2015, 3:27 PM   LOS: 1 day

## 2015-08-05 NOTE — Progress Notes (Signed)
Pt Placed on BiPap for bedtime at this time; tolerating fairly for now, will continue to monitor.

## 2015-08-05 NOTE — Progress Notes (Signed)
ANTIBIOTIC CONSULT NOTE - FOLLOW UP  Pharmacy Consult for Vancomycin + Cefazolin Indication: MRSA PNA + Staph Aureus Bacteremia (pending sens)  Allergies  Allergen Reactions  . Morphine And Related Anaphylaxis    Patient Measurements: Height: 5\' 9"  (175.3 cm) Weight: 230 lb 2.6 oz (104.4 kg) IBW/kg (Calculated) : 70.7  Vital Signs: Temp: 98.9 F (37.2 C) (09/04 0800) Temp Source: Oral (09/04 0800) BP: 130/65 mmHg (09/04 1023) Pulse Rate: 78 (09/04 1023) Intake/Output from previous day: 09/03 0701 - 09/04 0700 In: 4615 [P.O.:320; I.V.:3845; IV Piggyback:450] Out: 8841 [Urine:3950] Intake/Output from this shift: Total I/O In: 1500 [I.V.:1500] Out: 650 [Urine:650]  Labs:  Recent Labs  08/03/15 1034 08/04/15 0327 08/04/15 0740 08/04/15 1821  WBC 8.7 9.5 8.8 7.3  HGB 9.9* 9.5* 7.8* 7.6*  PLT 161 171 141* 165  CREATININE 1.2 1.30* 1.21  --    Estimated Creatinine Clearance: 76.4 mL/min (by C-G formula based on Cr of 1.21). No results for input(s): VANCOTROUGH, VANCOPEAK, VANCORANDOM, GENTTROUGH, GENTPEAK, GENTRANDOM, TOBRATROUGH, TOBRAPEAK, TOBRARND, AMIKACINPEAK, AMIKACINTROU, AMIKACIN in the last 72 hours.   Microbiology: Recent Results (from the past 720 hour(s))  Culture, expectorated sputum-assessment     Status: None   Collection Time: 07/23/15  6:38 AM  Result Value Ref Range Status   Specimen Description SPUTUM  Final   Special Requests NONE  Final   Sputum evaluation   Final    THIS SPECIMEN IS ACCEPTABLE. RESPIRATORY CULTURE REPORT TO FOLLOW.   Report Status 07/23/2015 FINAL  Final  Culture, respiratory (NON-Expectorated)     Status: None   Collection Time: 07/23/15  6:38 AM  Result Value Ref Range Status   Specimen Description SPUTUM  Final   Special Requests NONE  Final   Gram Stain   Final    NO WBC SEEN FEW SQUAMOUS EPITHELIAL CELLS PRESENT FEW GRAM POSITIVE COCCI IN CLUSTERS Performed at [facility]    Culture   Final    MODERATE  METHICILLIN RESISTANT STAPHYLOCOCCUS AUREUS Note: RIFAMPIN AND GENTAMICIN SHOULD NOT BE USED AS SINGLE DRUGS FOR TREATMENT OF STAPH INFECTIONS. This organism DOES NOT demonstrate inducible Clindamycin resistance in vitro. Performed at [facility]    Report Status 07/26/2015 FINAL  Final   Organism ID, Bacteria METHICILLIN RESISTANT STAPHYLOCOCCUS AUREUS  Final      Susceptibility   Methicillin resistant staphylococcus aureus - MIC*    CLINDAMYCIN <=0.25 SENSITIVE Sensitive     ERYTHROMYCIN >=8 RESISTANT Resistant     GENTAMICIN <=0.5 SENSITIVE Sensitive     LEVOFLOXACIN 4 INTERMEDIATE Intermediate     OXACILLIN >=4 RESISTANT Resistant     RIFAMPIN <=0.5 SENSITIVE Sensitive     TRIMETH/SULFA <=10 SENSITIVE Sensitive     VANCOMYCIN 1 SENSITIVE Sensitive     TETRACYCLINE <=1 SENSITIVE Sensitive     * MODERATE METHICILLIN RESISTANT STAPHYLOCOCCUS AUREUS  Blood Culture (routine x 2)     Status: None (Preliminary result)   Collection Time: 08/04/15  3:27 AM  Result Value Ref Range Status   Specimen Description BLOOD LEFT FOREARM  Final   Special Requests BOTTLES DRAWN AEROBIC AND ANAEROBIC 10CC  Final   Culture  Setup Time   Final    AEROBIC BOTTLE ONLY GRAM POSITIVE COCCI IN CLUSTERS CRITICAL RESULT CALLED TO, READ BACK BY AND VERIFIED WITH: [place] 2221 08/04/15 M.CAMPBELL CONFIRMED BY R.GREENE    Culture STAPHYLOCOCCUS AUREUS  Final   Report Status PENDING  Incomplete  MRSA PCR Screening     Status: Abnormal  Collection Time: 08/04/15 10:02 AM  Result Value Ref Range Status   MRSA by PCR POSITIVE (A) NEGATIVE Final    Comment:        The GeneXpert MRSA Assay (FDA approved for NASAL specimens only), is one component of a comprehensive MRSA colonization surveillance program. It is not intended to diagnose MRSA infection nor to guide or monitor treatment for MRSA infections. RESULT CALLED TO, READ BACK BY AND VERIFIED WITH: NOTIFIED M. LESSARD RN 08/04/15 AT 1139  BY A. DAVIS     Anti-infectives    Start     Dose/Rate Route Frequency Ordered Stop   08/05/15 1400  ceFAZolin (ANCEF) IVPB 2 g/50 mL premix     2 g 100 mL/hr over 30 Minutes Intravenous 3 times per day 08/05/15 1044     08/04/15 1800  vancomycin (VANCOCIN) IVPB 750 mg/150 ml premix     750 mg 150 mL/hr over 60 Minutes Intravenous Every 12 hours 08/04/15 0731     08/04/15 1200  piperacillin-tazobactam (ZOSYN) IVPB 3.375 g  Status:  Discontinued     3.375 g 12.5 mL/hr over 240 Minutes Intravenous 3 times per day 08/04/15 0731 08/05/15 1037   08/04/15 0315  piperacillin-tazobactam (ZOSYN) IVPB 3.375 g     3.375 g 100 mL/hr over 30 Minutes Intravenous  Once 08/04/15 0300 08/04/15 0404   08/04/15 0315  vancomycin (VANCOCIN) IVPB 1000 mg/200 mL premix  Status:  Discontinued     1,000 mg 200 mL/hr over 60 Minutes Intravenous  Once 08/04/15 0300 08/04/15 0309   08/04/15 0315  vancomycin (VANCOCIN) 1,500 mg in sodium chloride 0.9 % 500 mL IVPB     1,500 mg 250 mL/hr over 120 Minutes Intravenous  Once 08/04/15 0309 08/04/15 0543      Assessment: 61 YOM with hx stage IV tongue CA who presented to the [facility] on 9/3 with SOB and chest pressure. Of noting, the patient was recently admitted from 8/19 >> 8/23 with N/V, pain, and dehydration. During that admission, respiratory cultures from 8/22 resulted as MRSA on 8/25. Unfortunately, the patient was discharged on 8/23 prior to the result of the cultures and was sent out on Augmentin.  1/2 Blood cultures are now growing Staph Aureus (pending sensitivities). It is possible this could have seeded from untreated MRSA PNA. While awaiting sensitivities - pharmacy has been asked to transition to Vancomycin + Cefazolin. SCr 1.21, CrCl~60-70 ml/min (normalized)  Goal of Therapy:  Vancomycin trough level 15-20 mcg/ml   Plan:  1. Continue Vancomycin 750 mg IV every 12 hours - a trough has been ordered for 9/5 AM 2. Start Cefazolin 2g IV every 8 hours 3.  Will continue to follow renal function, culture results, LOT, and antibiotic de-escalation plans   Alycia Rossetti, PharmD, BCPS Clinical Pharmacist Pager: [phone] 08/05/2015 10:50 AM

## 2015-08-06 ENCOUNTER — Inpatient Hospital Stay (HOSPITAL_COMMUNITY): Payer: PPO

## 2015-08-06 DIAGNOSIS — G8929 Other chronic pain: Secondary | ICD-10-CM

## 2015-08-06 DIAGNOSIS — R0600 Dyspnea, unspecified: Secondary | ICD-10-CM

## 2015-08-06 DIAGNOSIS — B9562 Methicillin resistant Staphylococcus aureus infection as the cause of diseases classified elsewhere: Secondary | ICD-10-CM | POA: Diagnosis present

## 2015-08-06 DIAGNOSIS — R7881 Bacteremia: Secondary | ICD-10-CM | POA: Diagnosis present

## 2015-08-06 LAB — VANCOMYCIN, TROUGH: Vancomycin Tr: 10 ug/mL (ref 10.0–20.0)

## 2015-08-06 LAB — COMPREHENSIVE METABOLIC PANEL
ALK PHOS: 72 U/L (ref 38–126)
ALT: 16 U/L — AB (ref 17–63)
AST: 34 U/L (ref 15–41)
Albumin: 2.5 g/dL — ABNORMAL LOW (ref 3.5–5.0)
Anion gap: 9 (ref 5–15)
BILIRUBIN TOTAL: 0.5 mg/dL (ref 0.3–1.2)
BUN: <5 mg/dL — ABNORMAL LOW (ref 6–20)
CALCIUM: 8.5 mg/dL — AB (ref 8.9–10.3)
CO2: 30 mmol/L (ref 22–32)
CREATININE: 0.9 mg/dL (ref 0.61–1.24)
Chloride: 99 mmol/L — ABNORMAL LOW (ref 101–111)
Glucose, Bld: 104 mg/dL — ABNORMAL HIGH (ref 65–99)
Potassium: 3.1 mmol/L — ABNORMAL LOW (ref 3.5–5.1)
Sodium: 138 mmol/L (ref 135–145)
Total Protein: 6.4 g/dL — ABNORMAL LOW (ref 6.5–8.1)

## 2015-08-06 LAB — CBC
HCT: 25.2 % — ABNORMAL LOW (ref 39.0–52.0)
Hemoglobin: 7.9 g/dL — ABNORMAL LOW (ref 13.0–17.0)
MCH: 29 pg (ref 26.0–34.0)
MCHC: 31.3 g/dL (ref 30.0–36.0)
MCV: 92.6 fL (ref 78.0–100.0)
PLATELETS: 264 10*3/uL (ref 150–400)
RBC: 2.72 MIL/uL — AB (ref 4.22–5.81)
RDW: 17.5 % — ABNORMAL HIGH (ref 11.5–15.5)
WBC: 5.9 10*3/uL (ref 4.0–10.5)

## 2015-08-06 LAB — CULTURE, BLOOD (ROUTINE X 2)

## 2015-08-06 LAB — GLUCOSE, CAPILLARY
GLUCOSE-CAPILLARY: 90 mg/dL (ref 65–99)
Glucose-Capillary: 125 mg/dL — ABNORMAL HIGH (ref 65–99)
Glucose-Capillary: 117 mg/dL — ABNORMAL HIGH (ref 65–99)
Glucose-Capillary: 123 mg/dL — ABNORMAL HIGH (ref 65–99)

## 2015-08-06 LAB — LEGIONELLA ANTIGEN, URINE

## 2015-08-06 LAB — SEDIMENTATION RATE: Sed Rate: 122 mm/hr — ABNORMAL HIGH (ref 0–16)

## 2015-08-06 LAB — C-REACTIVE PROTEIN: CRP: 19.1 mg/dL — ABNORMAL HIGH (ref <1.0)

## 2015-08-06 LAB — PROCALCITONIN: PROCALCITONIN: 3.01 ng/mL

## 2015-08-06 MED ORDER — SODIUM CHLORIDE 0.9 % IV SOLN
1250.0000 mg | Freq: Two times a day (BID) | INTRAVENOUS | Status: DC
  Administered 2015-08-06 – 2015-08-08 (×5): 1250 mg via INTRAVENOUS
  Filled 2015-08-06 (×5): qty 1250

## 2015-08-06 MED ORDER — BOOST PLUS PO LIQD
2.0000 | Freq: Three times a day (TID) | ORAL | Status: DC
  Administered 2015-08-06 – 2015-08-07 (×2): 474 mL via ORAL
  Administered 2015-08-08: 237 mL via ORAL
  Administered 2015-08-09 – 2015-08-12 (×4): 474 mL via ORAL
  Filled 2015-08-06 (×26): qty 474

## 2015-08-06 MED ORDER — ENOXAPARIN SODIUM 60 MG/0.6ML ~~LOC~~ SOLN
50.0000 mg | SUBCUTANEOUS | Status: DC
Start: 2015-08-07 — End: 2015-08-07

## 2015-08-06 MED ORDER — POTASSIUM CHLORIDE CRYS ER 20 MEQ PO TBCR
40.0000 meq | EXTENDED_RELEASE_TABLET | Freq: Two times a day (BID) | ORAL | Status: AC
  Administered 2015-08-06 (×2): 40 meq via ORAL
  Filled 2015-08-06 (×2): qty 2

## 2015-08-06 MED ORDER — HYDROMORPHONE HCL 2 MG PO TABS
2.0000 mg | ORAL_TABLET | ORAL | Status: DC | PRN
  Administered 2015-08-06 – 2015-08-07 (×7): 2 mg via ORAL
  Filled 2015-08-06 (×7): qty 1

## 2015-08-06 MED ORDER — MODAFINIL 100 MG PO TABS
200.0000 mg | ORAL_TABLET | Freq: Every day | ORAL | Status: DC
  Administered 2015-08-06 – 2015-08-12 (×6): 200 mg via ORAL
  Filled 2015-08-06: qty 1
  Filled 2015-08-06 (×2): qty 2
  Filled 2015-08-06: qty 1
  Filled 2015-08-06 (×3): qty 2

## 2015-08-06 NOTE — Progress Notes (Signed)
[place] for Infectious Disease    Subjective: No new complaints   Antibiotics:  Anti-infectives    Start     Dose/Rate Route Frequency Ordered Stop   08/06/15 0630  vancomycin (VANCOCIN) 1,250 mg in sodium chloride 0.9 % 250 mL IVPB     1,250 mg 166.7 mL/hr over 90 Minutes Intravenous Every 12 hours 08/06/15 0627     08/05/15 1400  ceFAZolin (ANCEF) IVPB 2 g/50 mL premix  Status:  Discontinued     2 g 100 mL/hr over 30 Minutes Intravenous 3 times per day 08/05/15 1044 08/06/15 0919   08/04/15 1800  vancomycin (VANCOCIN) IVPB 750 mg/150 ml premix  Status:  Discontinued     750 mg 150 mL/hr over 60 Minutes Intravenous Every 12 hours 08/04/15 0731 08/06/15 0627   08/04/15 1200  piperacillin-tazobactam (ZOSYN) IVPB 3.375 g  Status:  Discontinued     3.375 g 12.5 mL/hr over 240 Minutes Intravenous 3 times per day 08/04/15 0731 08/05/15 1037   08/04/15 0315  piperacillin-tazobactam (ZOSYN) IVPB 3.375 g     3.375 g 100 mL/hr over 30 Minutes Intravenous  Once 08/04/15 0300 08/04/15 0404   08/04/15 0315  vancomycin (VANCOCIN) IVPB 1000 mg/200 mL premix  Status:  Discontinued     1,000 mg 200 mL/hr over 60 Minutes Intravenous  Once 08/04/15 0300 08/04/15 0309   08/04/15 0315  vancomycin (VANCOCIN) 1,500 mg in sodium chloride 0.9 % 500 mL IVPB     1,500 mg 250 mL/hr over 120 Minutes Intravenous  Once 08/04/15 0309 08/04/15 0543      Medications: Scheduled Meds: . albuterol  2.5 mg Nebulization QID  . antiseptic oral rinse  7 mL Mouth Rinse QID  . aspirin EC  81 mg Oral Daily  . chlorhexidine gluconate  15 mL Mouth Rinse BID  . Chlorhexidine Gluconate Cloth  6 each Topical Q0600  . enoxaparin (LOVENOX) injection  40 mg Subcutaneous Q24H  . insulin aspart  0-5 Units Subcutaneous TID WC  . lactose free nutrition  2 Container Oral TID WC  . modafinil  200 mg Oral Daily  . mupirocin ointment  1 application Nasal BID  . potassium chloride  40 mEq Oral BID    . tobramycin  1 drop Left Eye 6 times per day  . vancomycin  1,250 mg Intravenous Q12H   Continuous Infusions: . sodium chloride 75 mL/hr at 08/06/15 0800   PRN Meds:.HYDROmorphone, lidocaine    Objective: Weight change: 5 lb 4.7 oz (2.4 kg)  Intake/Output Summary (Last 24 hours) at 08/06/15 1433 Last data filed at 08/06/15 1300  Gross per 24 hour  Intake 2056.25 ml  Output   2300 ml  Net -243.75 ml   Blood pressure 143/79, pulse 65, temperature 98.1 F (36.7 C), temperature source Oral, resp. rate 12, height 5\' 9"  (1.753 m), weight 231 lb 0.7 oz (104.8 kg), SpO2 99 %. Temp:  [97.7 F (36.5 C)-98.3 F (36.8 C)] 98.1 F (36.7 C) (09/05 1211) Pulse Rate:  [60-83] 65 (09/05 1211) Resp:  [11-22] 12 (09/05 1211) BP: (122-156)/(63-79) 143/79 mmHg (09/05 1211) SpO2:  [96 %-100 %] 99 % (09/05 1253) FiO2 (%):  [40 %-55 %] 40 % (09/05 0301) Weight:  [231 lb 0.7 oz (104.8 kg)] 231 lb 0.7 oz (104.8 kg) (09/05 0513)  Physical Exam: General: Alert and awake, oriented x3, HEENT: anicteric sclera, EOMI,  [facility]S regular rate, normal r, no murmur rubs  or gallops Chest: decreased Bs at bases. no wheezing Abdomen: soft nontender, nondistended, normal bowel sounds, Extremities: Right ankle warm but not especially swollen not tender  Skin: porta cath site not overtly infected Neuro: non focal  CBC: CBC Latest Ref Rng 08/06/2015 08/04/2015 08/04/2015  WBC 4.0 - 10.5 K/uL 5.9 7.3 8.8  Hemoglobin 13.0 - 17.0 g/dL 7.9(L) 7.6(L) 7.8(L)  Hematocrit 39.0 - 52.0 % 25.2(L) 23.9(L) 25.1(L)  Platelets 150 - 400 K/uL 264 165 141(L)       BMET  Recent Labs  08/04/15 0740 08/06/15 0519  NA 135 138  K 4.3 3.1*  CL 104 99*  CO2 28 30  GLUCOSE 167* 104*  BUN 13 <5*  CREATININE 1.21 0.90  CALCIUM 7.7* 8.5*     Liver Panel   Recent Labs  08/04/15 0327 08/06/15 0519  PROT 6.5 6.4*  ALBUMIN 3.1* 2.5*  AST 21 34  ALT 14* 16*  ALKPHOS 90 72  BILITOT 0.6 0.5        Sedimentation Rate  Recent Labs  08/06/15 0519  ESRSEDRATE 122*   C-Reactive Protein  Recent Labs  08/06/15 0519  CRP 19.1*    Micro Results: Recent Results (from the past 720 hour(s))  Culture, expectorated sputum-assessment     Status: None   Collection Time: 07/23/15  6:38 AM  Result Value Ref Range Status   Specimen Description SPUTUM  Final   Special Requests NONE  Final   Sputum evaluation   Final    THIS SPECIMEN IS ACCEPTABLE. RESPIRATORY CULTURE REPORT TO FOLLOW.   Report Status 07/23/2015 FINAL  Final  Culture, respiratory (NON-Expectorated)     Status: None   Collection Time: 07/23/15  6:38 AM  Result Value Ref Range Status   Specimen Description SPUTUM  Final   Special Requests NONE  Final   Gram Stain   Final    NO WBC SEEN FEW SQUAMOUS EPITHELIAL CELLS PRESENT FEW GRAM POSITIVE COCCI IN CLUSTERS Performed at [facility]    Culture   Final    MODERATE METHICILLIN RESISTANT STAPHYLOCOCCUS AUREUS Note: RIFAMPIN AND GENTAMICIN SHOULD NOT BE USED AS SINGLE DRUGS FOR TREATMENT OF STAPH INFECTIONS. This organism DOES NOT demonstrate inducible Clindamycin resistance in vitro. Performed at [facility]    Report Status 07/26/2015 FINAL  Final   Organism ID, Bacteria METHICILLIN RESISTANT STAPHYLOCOCCUS AUREUS  Final      Susceptibility   Methicillin resistant staphylococcus aureus - MIC*    CLINDAMYCIN <=0.25 SENSITIVE Sensitive     ERYTHROMYCIN >=8 RESISTANT Resistant     GENTAMICIN <=0.5 SENSITIVE Sensitive     LEVOFLOXACIN 4 INTERMEDIATE Intermediate     OXACILLIN >=4 RESISTANT Resistant     RIFAMPIN <=0.5 SENSITIVE Sensitive     TRIMETH/SULFA <=10 SENSITIVE Sensitive     VANCOMYCIN 1 SENSITIVE Sensitive     TETRACYCLINE <=1 SENSITIVE Sensitive     * MODERATE METHICILLIN RESISTANT STAPHYLOCOCCUS AUREUS  Blood Culture (routine x 2)     Status: None   Collection Time: 08/04/15  3:27 AM  Result Value Ref Range Status    Specimen Description BLOOD LEFT FOREARM  Final   Special Requests BOTTLES DRAWN AEROBIC AND ANAEROBIC 10CC  Final   Culture  Setup Time   Final    AEROBIC BOTTLE ONLY GRAM POSITIVE COCCI IN CLUSTERS CRITICAL RESULT CALLED TO, READ BACK BY AND VERIFIED WITH: Gardiner [id number] 08/04/15 M.CAMPBELL CONFIRMED BY R.GREENE    Culture METHICILLIN RESISTANT STAPHYLOCOCCUS AUREUS  Final  Report Status 08/06/2015 FINAL  Final   Organism ID, Bacteria METHICILLIN RESISTANT STAPHYLOCOCCUS AUREUS  Final      Susceptibility   Methicillin resistant staphylococcus aureus - MIC*    CIPROFLOXACIN >=8 RESISTANT Resistant     ERYTHROMYCIN >=8 RESISTANT Resistant     GENTAMICIN <=0.5 SENSITIVE Sensitive     OXACILLIN >=4 RESISTANT Resistant     TETRACYCLINE <=1 SENSITIVE Sensitive     VANCOMYCIN <=0.5 SENSITIVE Sensitive     TRIMETH/SULFA <=10 SENSITIVE Sensitive     CLINDAMYCIN <=0.25 SENSITIVE Sensitive     RIFAMPIN <=0.5 SENSITIVE Sensitive     Inducible Clindamycin NEGATIVE Sensitive     * METHICILLIN RESISTANT STAPHYLOCOCCUS AUREUS  Blood Culture (routine x 2)     Status: None (Preliminary result)   Collection Time: 08/04/15  3:27 AM  Result Value Ref Range Status   Specimen Description BLOOD NECK  Final   Special Requests BOTTLES DRAWN AEROBIC AND ANAEROBIC 5CC  Final   Culture NO GROWTH 1 DAY  Final   Report Status PENDING  Incomplete  Urine culture     Status: None   Collection Time: 08/04/15  3:49 AM  Result Value Ref Range Status   Specimen Description URINE, CLEAN CATCH  Final   Special Requests NONE  Final   Culture 2,000 COLONIES/mL INSIGNIFICANT GROWTH  Final   Report Status 08/05/2015 FINAL  Final  MRSA PCR Screening     Status: Abnormal   Collection Time: 08/04/15 10:02 AM  Result Value Ref Range Status   MRSA by PCR POSITIVE (A) NEGATIVE Final    Comment:        The GeneXpert MRSA Assay (FDA approved for NASAL specimens only), is one component of a comprehensive MRSA  colonization surveillance program. It is not intended to diagnose MRSA infection nor to guide or monitor treatment for MRSA infections. RESULT CALLED TO, READ BACK BY AND VERIFIED WITH: NOTIFIED M. LESSARD RN 08/04/15 AT 1139 BY A. DAVIS     Studies/Results: Dg Chest 2 View  08/05/2015   CLINICAL DATA:  Cough, chest congestion and shortness of breath.  EXAM: CHEST  2 VIEW  COMPARISON:  Yesterday.  FINDINGS: Improved inspiration. Normal sized heart. Mildly prominent interstitial markings and mild central peribronchial thickening without significant change. Decreased airspace opacity at the medial right lung base with persistent opacity seen in the right middle lobe on the lateral view. Interval minimal patchy opacity in the right upper lobe. The left lung base is currently clear. Mild thoracic spine degenerative changes. Stable right jugular porta catheter.  IMPRESSION: 1. Improved aeration at the right lung base with residual patchy opacity in the right middle lobe concerning for pneumonia. 2. Interval minimal right upper lobe pneumonia. 3. Mild chronic bronchitic changes.   Electronically Signed   By: Claudie Revering M.D.   On: 08/05/2015 09:42      Assessment/Plan:  INTERVAL HISTORY:  08/04/15 TTE with grade 1 diastolic dysfunction; thickened aortic valve with mild AI; mild biatrial enlargement; mild MR. 08/04/15 respiratory distress requiring delay of MRI 08/05/15 MRI attempted but pt said he was shaking throughout the procedure, ? Any helpful images or not   Principal Problem:   Sepsis Active Problems:   Tongue cancer   Chronic neck pain   Anemia due to antineoplastic chemotherapy   Chemotherapy-induced nausea   HCAP (healthcare-associated pneumonia)   Acute respiratory failure with hypoxia   Elevated troponin   Acute hyperglycemia   RBBB   OSA (obstructive sleep apnea)  Closed right ankle fracture   HTN (hypertension)   QT prolongation   Bacteremia   Metastatic cancer    Staphylococcus aureus bacteremia with sepsis   Infection due to portacath   Screen for STD (sexually transmitted disease)    Shane Cantu is a 59 y.o. male with   59 y.o. 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 He has an indwelling Port-A-Cath will need to be removed he also has severe lower back pain and has had a recent fracture his right ankle.  #1 Severe sepsis due to Staphylococcus aureus bacteremia with multifocal pneumonia    [facility] Staphylococcus aureus bacteremia   Staphylococcus aureus bacteremia (SAB) is associated with a high rate of complications and mortality. Specific aspects of clinical management are critical to optimizing the outcome of patients with SAB. Therefore, the [facility] Antimicrobial Management Team [facility]) has initiated an intervention aimed at improving the management of SAB at [facility]. To do so, Infectious Diseases physicians are providing an evidence-based consult for the management of all patients with SAB.     Yes No Comments  Perform follow-up blood cultures (even if the patient is afebrile) to ensure clearance of bacteremia [X]  [ ]  repeat blood cultures ordered  And need to be REPEATED AGAIN WHEN PORTACATH OUT  Remove vascular catheter and obtain follow-up blood cultures after the removal of the catheter [X]  [ ]  Port-A-Cath has to come out and conveyed this to Dr. Thereasa Solo   Perform echocardiography to evaluate for endocarditis (transthoracic ECHO is 40-50% sensitive, TEE is > 90% sensitive) [ ]  [ ]  Please keep in mind, that neither test can definitively EXCLUDE endocarditis, and that should clinical suspicion remain high for endocarditis the patient should then still be treated with an "endocarditis" duration of  therapy = 6 weeks  2-D echocardiogram done and no vegetation seen on 2-D echo TEE may be difficult if the XRT has damaged his esophagus at all. He has NOT had surgery on tongue        Ensure source control [ ]  [ ]  Have all abscesses been drained effectively? Have deep seeded infections (septic joints or osteomyelitis) had appropriate surgical debridement?  Port a cath must come out   Investigate for "metastatic" sites of infection [ ]  [ ]  Does the patient have ANY symptom or physical exam finding that would suggest a deeper infection (back or neck pain that may be suggestive of vertebral osteomyelitis or epidural abscess, muscle pain that could be a symptom of pyomyositis)?  Keep in mind that for deep seeded infections MRI imaging with contrast is preferred rather than other often insensitive tests such as plain x-rays, especially early in a patient's presentation.  He needs an MRI of his neck and lumbar spine he may also need his right ankle looked at as well more carefully   Change antibiotic therapy to vancomycin [ ]  [ ]  Beta-lactam antibiotics are preferred for MSSA due to higher cure rates.  If on Vancomycin, goal trough should be 15 - 20 mcg/mL  Estimated duration of IV antibiotic therapy: 6-8 weeks  [ ]  [ ]  Consult case management for probably prolonged outpatient IV antibiotic therapy   #2 Screening:HIV negative and HCV pending      Dr. Johnnye Sima back on service tomorrow.    LOS: 2 days   Alcide Evener  08/06/2015, 2:33 PM

## 2015-08-06 NOTE — Progress Notes (Signed)
Initial Nutrition Assessment   INTERVENTION:   Boost Plus 2 containers TID (pt will drink as indicated)   NUTRITION DIAGNOSIS:   Inadequate oral intake related to  (side effects of chemotherapy) as evidenced by per patient/family report.   GOAL:   Patient will meet greater than or equal to 90% of their needs   MONITOR:   PO intake, Supplement acceptance, Labs, I & O's  REASON FOR ASSESSMENT:   Malnutrition Screening Tool    ASSESSMENT:   Pt with history of sleep apnea, stage IV tongue cancer on chemotherapy (treatment course complicated by ongoing intractable neck pain and nausea without vomiting), hypertension, GERD, nondisplaced right ankle fracture (occurred in July) stable with utilization of fracture boot, who was discharged on 8/23 after being hospitalized for intractable nausea and dehydration secondary to chemotherapy. He was seen by his Oncologist on 9/2 with no significant issues other than pain management, and he was told a recent PET noted improvement in his cancer. He presented to the ER after awakening during the night with shortness of breath and chest pressure.   Reviewed outpatient RD notes.  Pain and nausea continue to be an issue for him.  Per pt he declines after chemo usually eating less and as he gets out from his chemo he starts to do better, eating better. The process repeats after each chemo. Pt typically drinks 6 Boost per day when not eating well and drinks less as he is able to eat better.   Diet Order:  Diet regular Room service appropriate?: Yes; Fluid consistency:: Thin  Skin:  Reviewed, no issues  Last BM:  9/5  Height:   Ht Readings from Last 1 Encounters:  08/04/15 5\' 9"  (1.753 m)    Weight:   Wt Readings from Last 1 Encounters:  08/06/15 231 lb 0.7 oz (104.8 kg)    Ideal Body Weight:  72.7 kg  BMI:  Body mass index is 34.1 kg/(m^2).  Estimated Nutritional Needs:   Kcal:  2200-2400  Protein:  100-115 grams  Fluid:  > 2L  /day  EDUCATION NEEDS:   No education needs identified at this time  [place], [place], [place] Pager [phone] After Hours Pager

## 2015-08-06 NOTE — Progress Notes (Signed)
RT placed pt on Bi-pap to sleep per MD order. RT will continue to monitor.

## 2015-08-06 NOTE — Progress Notes (Signed)
[place] TEAM 1 - Stepdown/ICU TEAM PROGRESS NOTE  Shane Shane Cantu WHQ:[id number] DOB: [birth year] DOA: 08/04/2015 PCP: [name], MD  Admit HPI / Brief Narrative: 59 yo with history of sleep apnea, stage IV tongue cancer on chemotherapy (treatment course complicated by ongoing intractable neck pain and nausea without vomiting), hypertension, GERD, nondisplaced right ankle fracture (occurred in July) stable with utilization of fracture boot, who was discharged on 8/23 after being hospitalized for intractable nausea and dehydration secondary to chemotherapy. He was seen by his Oncologist on 9/2 with no significant issues other than pain management, and he was told a recent PET noted improvement in his cancer. He presented to the ER after awakening during the night with shortness of breath and chest pressure.   In the ED his temperature was 101.6, his heart rate was 131 bpm sinus tachycardia, room air saturations 66%. Repeat temperature was 102F rectal.  Chest x-ray revealed a right basilar airspace opacity concerning for pneumonia. CT angiogram of the chest was without findings of pulmonary embolism but did suggest pneumonia.   HPI/Subjective: The patient is much more alert and interactive today.  He complains of ongoing severe low back pain.  He denies chest pain shortness breath fevers chills nausea or vomiting.  Assessment/Plan:  Severe sepsis due to MRSA bacteremia w/ multifocal PNA Continue current antibiotic regimen as suggested by ID - full endocarditis evaluation will be required to include MRI of spine - hold on further imaging today until respiratory status proves to be more stable - as noted per ID his Port-A-Cath will need to be removed (will consult IR to see 9/6 - PowerPort placed per Dr. Laurence Ferrari 05/09/2015) - hemodynamically much improved today   Acute hypoxic respiratory failure due to HCAP - acute hypercarbic respiratory failure due to profoundly severe sleep apnea Utilize  CPAP or BiPAP at all times when asleep - monitor closely with supplemental oxygen - patient was observed to be barely breathing 9/4 afternoon while napping and was nearly intubated before spontaneously and nearly miraculously waking up in no distress  Chronic neck and low back pain This may simply represent his chronic pain but it could also be related to staph aureus discitis, spinal osteomyelitis, or even epidural abscess - neurologically he is intact at present - MRI reordered for today   Elevated troponin Likely simply stress induced - appears to have peaked at 0.38  Tongue cancer As per Oncology - was last seen in the office 08/03/15  Anemia due to chemotherapy Follow hemoglobin trend - keep hemoglobin 7.0 or greater - appears to have stabilized at ~7.9  Hyperglycemia A1c pending   Hypomagnesemia Replace and follow  Hypokalemia Replace and follow   Severe obstructive sleep apnea utilize CPAP or BiPAP at all times when patient is sleeping and at bedtime   Code Status: FULL Family Communication: No family present at bedside today Disposition Plan: SDU  Consultants: ID PCCM  Procedures: 9/4 LE dopplers - no DVT  Antibiotics: Cefazolin 9/4 > Vancomycin 9/3 >  DVT prophylaxis: Lovenox  Objective: Blood pressure 143/79, pulse 65, temperature 98.1 F (36.7 C), temperature source Oral, resp. rate 12, height 5\' 9"  (1.753 m), weight 104.8 kg (231 lb 0.7 oz), SpO2 99 %.  Intake/Output Summary (Last 24 hours) at 08/06/15 1530 Last data filed at 08/06/15 1500  Gross per 24 hour  Intake 2206.25 ml  Output   2650 ml  Net -443.75 ml   Exam: General: No respiratory distress - alert and conversant  Lungs:  Clear to auscultation bilaterally without wheeze Cardiovascular: Regular rate and rhythm without murmur appreciable Abdomen: Nontender, protuberant, soft, bowel sounds positive, no rebound, no ascites, no appreciable mass Extremities: No significant cyanosis, clubbing,  or edema bilateral lower extremities  Data Reviewed: Basic Metabolic Panel:  Recent Labs Lab 08/03/15 1034 08/04/15 0327 08/04/15 0740 08/05/15 0643 08/06/15 0519  NA 139 135 135  --  138  K 4.5 4.1 4.3  --  3.1*  CL  --  97* 104  --  99*  CO2 29 29 28   --  30  GLUCOSE 173* 201* 167*  --  104*  BUN 10.4 13 13   --  <5*  CREATININE 1.2 1.30* 1.21  --  0.90  CALCIUM 9.3 8.7* 7.7*  --  8.5*  MG 1.6  --   --  1.4*  --     CBC:  Recent Labs Lab 08/03/15 1034 08/04/15 0327 08/04/15 0740 08/04/15 1821 08/06/15 0516  WBC 8.7 9.5 8.8 7.3 5.9  NEUTROABS 7.9* 8.9*  --   --   --   HGB 9.9* 9.5* 7.8* 7.6* 7.9*  HCT 31.2* 29.4* 25.1* 23.9* 25.2*  MCV 92.6 93.6 93.7 93.7 92.6  PLT 161 171 141* 165 264    Liver Function Tests:  Recent Labs Lab 08/03/15 1034 08/04/15 0327 08/06/15 0519  AST 17 21 34  ALT 15 14* 16*  ALKPHOS 110 90 72  BILITOT 0.40 0.6 0.5  PROT 7.1 6.5 6.4*  ALBUMIN 3.2* 3.1* 2.5*   Cardiac Enzymes:  Recent Labs Lab 08/04/15 0336 08/04/15 1257 08/04/15 1821 08/04/15 2333  TROPONINI 0.20* 0.36* 0.34* 0.28*    CBG:  Recent Labs Lab 08/05/15 1240 08/05/15 1559 08/05/15 1947 08/06/15 0840 08/06/15 1210  GLUCAP 86 104* 105* 90 125*    Recent Results (from the past 240 hour(s))  Blood Culture (routine x 2)     Status: None   Collection Time: 08/04/15  3:27 AM  Result Value Ref Range Status   Specimen Description BLOOD LEFT FOREARM  Final   Special Requests BOTTLES DRAWN AEROBIC AND ANAEROBIC 10CC  Final   Culture  Setup Time   Final    AEROBIC BOTTLE ONLY GRAM POSITIVE COCCI IN CLUSTERS CRITICAL RESULT CALLED TO, READ BACK BY AND VERIFIED WITH: [place] 2221 08/04/15 M.CAMPBELL CONFIRMED BY R.GREENE    Culture METHICILLIN RESISTANT STAPHYLOCOCCUS AUREUS  Final   Report Status 08/06/2015 FINAL  Final   Organism ID, Bacteria METHICILLIN RESISTANT STAPHYLOCOCCUS AUREUS  Final      Susceptibility   Methicillin resistant  staphylococcus aureus - MIC*    CIPROFLOXACIN >=8 RESISTANT Resistant     ERYTHROMYCIN >=8 RESISTANT Resistant     GENTAMICIN <=0.5 SENSITIVE Sensitive     OXACILLIN >=4 RESISTANT Resistant     TETRACYCLINE <=1 SENSITIVE Sensitive     VANCOMYCIN <=0.5 SENSITIVE Sensitive     TRIMETH/SULFA <=10 SENSITIVE Sensitive     CLINDAMYCIN <=0.25 SENSITIVE Sensitive     RIFAMPIN <=0.5 SENSITIVE Sensitive     Inducible Clindamycin NEGATIVE Sensitive     * METHICILLIN RESISTANT STAPHYLOCOCCUS AUREUS  Blood Culture (routine x 2)     Status: None (Preliminary result)   Collection Time: 08/04/15  3:27 AM  Result Value Ref Range Status   Specimen Description BLOOD NECK  Final   Special Requests BOTTLES DRAWN AEROBIC AND ANAEROBIC 5CC  Final   Culture NO GROWTH 2 DAYS  Final   Report Status PENDING  Incomplete  Urine culture  Status: None   Collection Time: 08/04/15  3:49 AM  Result Value Ref Range Status   Specimen Description URINE, CLEAN CATCH  Final   Special Requests NONE  Final   Culture 2,000 COLONIES/mL INSIGNIFICANT GROWTH  Final   Report Status 08/05/2015 FINAL  Final  MRSA PCR Screening     Status: Abnormal   Collection Time: 08/04/15 10:02 AM  Result Value Ref Range Status   MRSA by PCR POSITIVE (A) NEGATIVE Final    Comment:        The GeneXpert MRSA Assay (FDA approved for NASAL specimens only), is one component of a comprehensive MRSA colonization surveillance program. It is not intended to diagnose MRSA infection nor to guide or monitor treatment for MRSA infections. RESULT CALLED TO, READ BACK BY AND VERIFIED WITH: NOTIFIED M. LESSARD RN 08/04/15 AT 1139 BY A. DAVIS   Culture, blood (routine x 2)     Status: None (Preliminary result)   Collection Time: 08/05/15 11:45 AM  Result Value Ref Range Status   Specimen Description BLOOD RIGHT ANTECUBITAL  Final   Special Requests BOTTLES DRAWN AEROBIC AND ANAEROBIC 10CC  Final   Culture NO GROWTH 1 DAY  Final   Report Status  PENDING  Incomplete  Culture, blood (routine x 2)     Status: None (Preliminary result)   Collection Time: 08/05/15 12:00 PM  Result Value Ref Range Status   Specimen Description BLOOD BLOOD RIGHT HAND  Final   Special Requests BOTTLES DRAWN AEROBIC AND ANAEROBIC 10CC  Final   Culture NO GROWTH 1 DAY  Final   Report Status PENDING  Incomplete     Studies:   Recent x-ray studies have been reviewed in detail by the Attending Physician  Scheduled Meds:  Scheduled Meds: . albuterol  2.5 mg Nebulization QID  . antiseptic oral rinse  7 mL Mouth Rinse QID  . aspirin EC  81 mg Oral Daily  . chlorhexidine gluconate  15 mL Mouth Rinse BID  . Chlorhexidine Gluconate Cloth  6 each Topical Q0600  . enoxaparin (LOVENOX) injection  40 mg Subcutaneous Q24H  . insulin aspart  0-5 Units Subcutaneous TID WC  . lactose free nutrition  2 Container Oral TID WC  . modafinil  200 mg Oral Daily  . mupirocin ointment  1 application Nasal BID  . potassium chloride  40 mEq Oral BID  . tobramycin  1 drop Left Eye 6 times per day  . vancomycin  1,250 mg Intravenous Q12H    Time spent on care of this patient: 35 mins   Soledad Budreau T , MD   [facility] Office  [phone] Pager - Text Page per Shea Evans as per below:  On-Call/Text Page:      [email or web address]      password [email or web address]  If 7PM-7AM, please contact night-coverage [email or web address] Password [email or web address] 08/06/2015, 3:30 PM   LOS: 2 days

## 2015-08-06 NOTE — Progress Notes (Signed)
ANTIBIOTIC CONSULT NOTE - INITIAL  Pharmacy Consult for Vancomycin  Indication: Recent MRSA PNA, likely MRSA bacteremia  Allergies  Allergen Reactions  . Morphine And Related Anaphylaxis    Patient Measurements: Height: 5\' 9"  (175.3 cm) Weight: 231 lb 0.7 oz (104.8 kg) IBW/kg (Calculated) : 70.7  Vital Signs: Temp: 97.9 F (36.6 C) (09/05 0513) Temp Source: Axillary (09/05 0513) BP: 156/72 mmHg (09/05 0513) Pulse Rate: 63 (09/05 0513)  Labs:  Recent Labs  08/04/15 0327 08/04/15 0740 08/04/15 1821 08/06/15 0516 08/06/15 0519  WBC 9.5 8.8 7.3 5.9  --   HGB 9.5* 7.8* 7.6* 7.9*  --   PLT 171 141* 165 264  --   CREATININE 1.30* 1.21  --   --  0.90   Estimated Creatinine Clearance: 102.8 mL/min (by C-G formula based on Cr of 0.9).  Recent Labs  08/06/15 0516  [facility]     Microbiology: Recent Results (from the past 720 hour(s))  Culture, expectorated sputum-assessment     Status: None   Collection Time: 07/23/15  6:38 AM  Result Value Ref Range Status   Specimen Description SPUTUM  Final   Special Requests NONE  Final   Sputum evaluation   Final    THIS SPECIMEN IS ACCEPTABLE. RESPIRATORY CULTURE REPORT TO FOLLOW.   Report Status 07/23/2015 FINAL  Final  Culture, respiratory (NON-Expectorated)     Status: None   Collection Time: 07/23/15  6:38 AM  Result Value Ref Range Status   Specimen Description SPUTUM  Final   Special Requests NONE  Final   Gram Stain   Final    NO WBC SEEN FEW SQUAMOUS EPITHELIAL CELLS PRESENT FEW GRAM POSITIVE COCCI IN CLUSTERS Performed at [facility]    Culture   Final    MODERATE METHICILLIN RESISTANT STAPHYLOCOCCUS AUREUS Note: RIFAMPIN AND GENTAMICIN SHOULD NOT BE USED AS SINGLE DRUGS FOR TREATMENT OF STAPH INFECTIONS. This organism DOES NOT demonstrate inducible Clindamycin resistance in vitro. Performed at [facility]    Report Status 07/26/2015 FINAL  Final   Organism ID, Bacteria METHICILLIN  RESISTANT STAPHYLOCOCCUS AUREUS  Final      Susceptibility   Methicillin resistant staphylococcus aureus - MIC*    CLINDAMYCIN <=0.25 SENSITIVE Sensitive     ERYTHROMYCIN >=8 RESISTANT Resistant     GENTAMICIN <=0.5 SENSITIVE Sensitive     LEVOFLOXACIN 4 INTERMEDIATE Intermediate     OXACILLIN >=4 RESISTANT Resistant     RIFAMPIN <=0.5 SENSITIVE Sensitive     TRIMETH/SULFA <=10 SENSITIVE Sensitive     VANCOMYCIN 1 SENSITIVE Sensitive     TETRACYCLINE <=1 SENSITIVE Sensitive     * MODERATE METHICILLIN RESISTANT STAPHYLOCOCCUS AUREUS  Blood Culture (routine x 2)     Status: None (Preliminary result)   Collection Time: 08/04/15  3:27 AM  Result Value Ref Range Status   Specimen Description BLOOD LEFT FOREARM  Final   Special Requests BOTTLES DRAWN AEROBIC AND ANAEROBIC 10CC  Final   Culture  Setup Time   Final    AEROBIC BOTTLE ONLY GRAM POSITIVE COCCI IN CLUSTERS CRITICAL RESULT CALLED TO, READ BACK BY AND VERIFIED WITH: [place] 2221 08/04/15 M.CAMPBELL CONFIRMED BY R.GREENE    Culture STAPHYLOCOCCUS AUREUS  Final   Report Status PENDING  Incomplete  Blood Culture (routine x 2)     Status: None (Preliminary result)   Collection Time: 08/04/15  3:27 AM  Result Value Ref Range Status   Specimen Description BLOOD NECK  Final   Special Requests  BOTTLES DRAWN AEROBIC AND ANAEROBIC 5CC  Final   Culture NO GROWTH 1 DAY  Final   Report Status PENDING  Incomplete  Urine culture     Status: None   Collection Time: 08/04/15  3:49 AM  Result Value Ref Range Status   Specimen Description URINE, CLEAN CATCH  Final   Special Requests NONE  Final   Culture 2,000 COLONIES/mL INSIGNIFICANT GROWTH  Final   Report Status 08/05/2015 FINAL  Final  MRSA PCR Screening     Status: Abnormal   Collection Time: 08/04/15 10:02 AM  Result Value Ref Range Status   MRSA by PCR POSITIVE (A) NEGATIVE Final    Comment:        The GeneXpert MRSA Assay (FDA approved for NASAL specimens only), is one  component of a comprehensive MRSA colonization surveillance program. It is not intended to diagnose MRSA infection nor to guide or monitor treatment for MRSA infections. RESULT CALLED TO, READ BACK BY AND VERIFIED WITH: NOTIFIED M. LESSARD RN 08/04/15 AT 1139 BY A. DAVIS     Medical History: Past Medical History  Diagnosis Date  . Chronic pain   . Back pain   . Chronic neck pain   . History of kidney stones   . Allergic rhinitis   . Hypertension   . Migraine headache without aura   . Sleep apnea     does not use cpap  . GERD (gastroesophageal reflux disease)   . Neuropathy   . Arthritis   . Cancer      may 2016 tongue cancer  . Bacterial conjunctivitis of left eye 07/26/2015    Assessment: Sub-therapeutic vancomycin trough, Scr trended back down  Goal of Therapy:  Vancomycin trough level 15-20 mcg/ml  Plan:  -Increase vancomycin to 1250 mg IV q12h -Re-check trough at steady state -May need dose adjustment if SCr trends back up  Narda Bonds 08/06/2015,6:19 AM

## 2015-08-07 ENCOUNTER — Telehealth: Payer: Self-pay | Admitting: *Deleted

## 2015-08-07 ENCOUNTER — Inpatient Hospital Stay (HOSPITAL_COMMUNITY): Payer: PPO

## 2015-08-07 DIAGNOSIS — J189 Pneumonia, unspecified organism: Secondary | ICD-10-CM

## 2015-08-07 DIAGNOSIS — J9602 Acute respiratory failure with hypercapnia: Secondary | ICD-10-CM

## 2015-08-07 DIAGNOSIS — D649 Anemia, unspecified: Secondary | ICD-10-CM

## 2015-08-07 DIAGNOSIS — G8929 Other chronic pain: Secondary | ICD-10-CM

## 2015-08-07 DIAGNOSIS — I5032 Chronic diastolic (congestive) heart failure: Secondary | ICD-10-CM

## 2015-08-07 DIAGNOSIS — D6481 Anemia due to antineoplastic chemotherapy: Secondary | ICD-10-CM

## 2015-08-07 DIAGNOSIS — T451X5A Adverse effect of antineoplastic and immunosuppressive drugs, initial encounter: Secondary | ICD-10-CM | POA: Diagnosis present

## 2015-08-07 DIAGNOSIS — R7881 Bacteremia: Secondary | ICD-10-CM

## 2015-08-07 DIAGNOSIS — X58XXXD Exposure to other specified factors, subsequent encounter: Secondary | ICD-10-CM

## 2015-08-07 DIAGNOSIS — B9562 Methicillin resistant Staphylococcus aureus infection as the cause of diseases classified elsewhere: Secondary | ICD-10-CM

## 2015-08-07 DIAGNOSIS — G4733 Obstructive sleep apnea (adult) (pediatric): Secondary | ICD-10-CM

## 2015-08-07 DIAGNOSIS — J9601 Acute respiratory failure with hypoxia: Secondary | ICD-10-CM | POA: Diagnosis present

## 2015-08-07 DIAGNOSIS — S82891D Other fracture of right lower leg, subsequent encounter for closed fracture with routine healing: Secondary | ICD-10-CM

## 2015-08-07 DIAGNOSIS — R739 Hyperglycemia, unspecified: Secondary | ICD-10-CM

## 2015-08-07 DIAGNOSIS — Z959 Presence of cardiac and vascular implant and graft, unspecified: Secondary | ICD-10-CM

## 2015-08-07 DIAGNOSIS — M549 Dorsalgia, unspecified: Secondary | ICD-10-CM

## 2015-08-07 DIAGNOSIS — M542 Cervicalgia: Secondary | ICD-10-CM

## 2015-08-07 DIAGNOSIS — M545 Low back pain: Secondary | ICD-10-CM

## 2015-08-07 DIAGNOSIS — C029 Malignant neoplasm of tongue, unspecified: Secondary | ICD-10-CM

## 2015-08-07 DIAGNOSIS — Z9989 Dependence on other enabling machines and devices: Secondary | ICD-10-CM

## 2015-08-07 DIAGNOSIS — R6520 Severe sepsis without septic shock: Secondary | ICD-10-CM

## 2015-08-07 DIAGNOSIS — G4730 Sleep apnea, unspecified: Secondary | ICD-10-CM

## 2015-08-07 DIAGNOSIS — A419 Sepsis, unspecified organism: Secondary | ICD-10-CM

## 2015-08-07 DIAGNOSIS — E876 Hypokalemia: Secondary | ICD-10-CM | POA: Diagnosis present

## 2015-08-07 LAB — CBC
HEMATOCRIT: 25.3 % — AB (ref 39.0–52.0)
HEMOGLOBIN: 8 g/dL — AB (ref 13.0–17.0)
MCH: 28.9 pg (ref 26.0–34.0)
MCHC: 31.6 g/dL (ref 30.0–36.0)
MCV: 91.3 fL (ref 78.0–100.0)
Platelets: 335 10*3/uL (ref 150–400)
RBC: 2.77 MIL/uL — AB (ref 4.22–5.81)
RDW: 17.5 % — ABNORMAL HIGH (ref 11.5–15.5)
WBC: 5 10*3/uL (ref 4.0–10.5)

## 2015-08-07 LAB — COMPREHENSIVE METABOLIC PANEL
ALT: 16 U/L — ABNORMAL LOW (ref 17–63)
ANION GAP: 9 (ref 5–15)
AST: 32 U/L (ref 15–41)
Albumin: 2.6 g/dL — ABNORMAL LOW (ref 3.5–5.0)
Alkaline Phosphatase: 74 U/L (ref 38–126)
BILIRUBIN TOTAL: 0.3 mg/dL (ref 0.3–1.2)
CHLORIDE: 103 mmol/L (ref 101–111)
CO2: 27 mmol/L (ref 22–32)
Calcium: 8.8 mg/dL — ABNORMAL LOW (ref 8.9–10.3)
Creatinine, Ser: 0.86 mg/dL (ref 0.61–1.24)
Glucose, Bld: 111 mg/dL — ABNORMAL HIGH (ref 65–99)
POTASSIUM: 3.3 mmol/L — AB (ref 3.5–5.1)
Sodium: 139 mmol/L (ref 135–145)
TOTAL PROTEIN: 6.1 g/dL — AB (ref 6.5–8.1)

## 2015-08-07 LAB — GLUCOSE, CAPILLARY
GLUCOSE-CAPILLARY: 144 mg/dL — AB (ref 65–99)
GLUCOSE-CAPILLARY: 130 mg/dL — AB (ref 65–99)
Glucose-Capillary: 127 mg/dL — ABNORMAL HIGH (ref 65–99)

## 2015-08-07 LAB — MAGNESIUM: MAGNESIUM: 1.3 mg/dL — AB (ref 1.7–2.4)

## 2015-08-07 LAB — HEMOGLOBIN A1C
HEMOGLOBIN A1C: 6.9 % — AB (ref 4.8–5.6)
MEAN PLASMA GLUCOSE: 151 mg/dL

## 2015-08-07 LAB — HCV COMMENT:

## 2015-08-07 LAB — HEPATITIS C ANTIBODY (REFLEX): HCV Ab: <0.1 s/co ratio (ref 0.0–0.9)

## 2015-08-07 MED ORDER — LORAZEPAM 2 MG/ML IJ SOLN
2.0000 mg | Freq: Once | INTRAMUSCULAR | Status: AC
  Administered 2015-08-07: 2 mg via INTRAVENOUS
  Filled 2015-08-07: qty 1

## 2015-08-07 MED ORDER — LORAZEPAM 2 MG/ML IJ SOLN
1.0000 mg | Freq: Once | INTRAMUSCULAR | Status: AC
  Administered 2015-08-07: 2 mg via INTRAVENOUS
  Filled 2015-08-07: qty 1

## 2015-08-07 MED ORDER — METHADONE HCL 10 MG PO TABS
15.0000 mg | ORAL_TABLET | Freq: Three times a day (TID) | ORAL | Status: DC
  Administered 2015-08-07 – 2015-08-08 (×3): 15 mg via ORAL
  Filled 2015-08-07 (×3): qty 2

## 2015-08-07 MED ORDER — ENOXAPARIN SODIUM 60 MG/0.6ML ~~LOC~~ SOLN
50.0000 mg | SUBCUTANEOUS | Status: DC
  Administered 2015-08-08 – 2015-08-12 (×5): 50 mg via SUBCUTANEOUS
  Filled 2015-08-07 (×5): qty 0.6

## 2015-08-07 MED ORDER — FENTANYL CITRATE (PF) 100 MCG/2ML IJ SOLN
INTRAMUSCULAR | Status: AC
  Filled 2015-08-07: qty 2

## 2015-08-07 MED ORDER — MIDAZOLAM HCL 2 MG/2ML IJ SOLN
INTRAMUSCULAR | Status: AC | PRN
  Administered 2015-08-07: 1 mg via INTRAVENOUS

## 2015-08-07 MED ORDER — FENTANYL CITRATE (PF) 100 MCG/2ML IJ SOLN
INTRAMUSCULAR | Status: AC | PRN
  Administered 2015-08-07: 50 ug via INTRAVENOUS

## 2015-08-07 MED ORDER — LIDOCAINE-EPINEPHRINE (PF) 1 %-1:200000 IJ SOLN
INTRAMUSCULAR | Status: AC
  Filled 2015-08-07: qty 30

## 2015-08-07 MED ORDER — MIDAZOLAM HCL 2 MG/2ML IJ SOLN
INTRAMUSCULAR | Status: AC
  Filled 2015-08-07: qty 2

## 2015-08-07 MED ORDER — LORAZEPAM 2 MG/ML IJ SOLN: 1.0000 mg | Freq: Once | INTRAMUSCULAR | Status: DC

## 2015-08-07 MED ORDER — POTASSIUM CHLORIDE CRYS ER 20 MEQ PO TBCR
50.0000 meq | EXTENDED_RELEASE_TABLET | Freq: Two times a day (BID) | ORAL | Status: AC
  Administered 2015-08-07 (×2): 50 meq via ORAL
  Filled 2015-08-07 (×2): qty 3

## 2015-08-07 MED ORDER — MAGNESIUM SULFATE 50 % IJ SOLN
3.0000 g | Freq: Once | INTRAVENOUS | Status: AC
  Administered 2015-08-07: 3 g via INTRAVENOUS
  Filled 2015-08-07: qty 6

## 2015-08-07 NOTE — Progress Notes (Signed)
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 He was seen by his Oncologist on 9/2 with no significant issues other than pain management, and he was told a recent PET noted improvement in his cancer. He presented to the ER after awakening during the night with shortness of breath and chest pressure.   In the ED his temperature was 101.6, his heart rate was 131 bpm sinus tachycardia, room air saturations 66%. Repeat temperature was 102F rectal. Chest x-ray revealed a right basilar airspace opacity concerning for pneumonia. CT angiogram of the chest was without findings of pulmonary embolism but did suggest pneumonia.   HPI/Subjective: 9/6 A/O 4, NAD, negative CP, negative SOB.  Assessment/Plan:  Severe sepsis due to MRSA bacteremia w/ multifocal PNA -Continue current antibiotic regimen as suggested by ID  - Awaiting results of C-spine/T-spine MRI  - as noted per ID his Port-A-Cath will be replaced today.   Acute hypoxic respiratory failure due to HCAP/Acute hypercarbic respiratory failure due to profoundly severe sleep apnea -Resolved -Utilize CPAP or BiPAP at all times when asleep - monitor closely with supplemental oxygen -Restart patient's methadone at a reduced dose, monitor closely for over sedation  Chronic neck and low back pain -See severe sepsis  -Chronic pain vs  staph aureus discitis, vs spinal osteomyelitis,vs epidural abscess - neurologically he is intact at present    -Patient's MAR shows patient taking methadone 40 mg TID. Will need to verify prescriber and amount. In the interim will start patient on methadone 15 mg TID to prevent withdrawal. This would also explain patient's resistants to current pain regimen  Elevated troponin -Likely simply stress induced - appears to have peaked at 3.90  Diastolic CHF -Transfuse for hemoglobin<8  Tongue cancer -As per Oncology per [name] Will present his case at ENT tumor board for further discussion tomorrow on 9/7  Anemia due to chemotherapy -See diastolic CHF  Diabetes type 2 controlled -9/5 Hemoglobin A1c= 6.9   Hypomagnesemia -Magnesium goal > 2  -Magnesium IV 3 gm   Hypokalemia -Potassium goal> 4 -K-Dur 50 mEq BID for 2 doses    Code Status: FULL Family Communication: no family present at time of exam Disposition Plan: SNF vs home health    Consultants: [name] (oncology) Dr.Jeffrey C Hatcher (ID)   Procedure/Significant Events: 9/4 LE dopplers - no DVT 9/4 echocardiogram;- Left ventricle:  mild LVH.-LVEF= 50% to 55%. Hypokinesis of mid-apicalanteroseptal myocardium. -(grade 1 diastolic dysfunction).    Culture 9/3 blood left forearm positive MRSA 9/3 blood NGTD 9/3 urine insignificant growth 9/3 MRSA by PCR positive 9/4 blood right AC/HAND NGTD   Antibiotics: Cefazolin 9/4 > stopped 9/5 Vancomycin 9/3 >   DVT prophylaxis: Lovenox   Devices   LINES / TUBES:      Continuous Infusions: . sodium chloride 50 mL/hr at 08/07/15 0632    Objective: VITAL SIGNS: Temp: 97.7 F (36.5 C) (09/06 1932) Temp Source: Oral (09/06 1932) BP: 159/82 mmHg (09/06 1932) Pulse Rate: 74 (09/06 1932) SPO2; FIO2:   Intake/Output Summary (Last 24 hours) at 08/07/15  2013 Last data filed at 08/07/15 1654  Gross per 24 hour  Intake    750 ml  Output   1825 ml  Net  -1075 ml     Exam: General: A/O 4, complains of chronic pain neck and back, No acute  respiratory distress Eyes: Negative headache, eye pain, scleral hemorrhage ENT: Negative Runny nose, negative ear pain,negative gingival bleeding, Neck:  Negative scars, masses, torticollis, lymphadenopathy, palpation C-spine tender midline, negative T-spine tenderness to palpation, JVD Lungs: Clear to auscultation bilaterally without wheezes or crackles Cardiovascular: Regular rate and rhythm without murmur gallop or rub normal S1 and S2 Abdomen:negative abdominal pain, negative dysphagia, nondistended, positive soft, bowel sounds, no rebound, no ascites, no appreciable mass Extremities: No significant cyanosis, clubbing, or edema bilateral lower extremities Psychiatric:  Negative depression, negative anxiety, negative fatigue, negative mania  Neurologic:  Cranial nerves II through XII intact, tongue/uvula midline, all extremities muscle strength 5/5, sensation intact throughout,within normal limits, negative dysarthria, negative expressive aphasia, negative receptive aphasia.   Data Reviewed: Basic Metabolic Panel:  Recent Labs Lab 08/03/15 1034 08/04/15 0327 08/04/15 0740 08/05/15 0643 08/06/15 0519 08/07/15 0300  NA 139 135 135  --  138 139  K 4.5 4.1 4.3  --  3.1* 3.3*  CL  --  97* 104  --  99* 103  CO2 29 29 28   --  30 27  GLUCOSE 173* 201* 167*  --  104* 111*  BUN 10.4 13 13   --  <5* <5*  CREATININE 1.2 1.30* 1.21  --  0.90 0.86  CALCIUM 9.3 8.7* 7.7*  --  8.5* 8.8*  MG 1.6  --   --  1.4*  --  1.3*   Liver Function Tests:  Recent Labs Lab 08/03/15 1034 08/04/15 0327 08/06/15 0519 08/07/15 0300  AST 17 21 34 32  ALT 15 14* 16* 16*  ALKPHOS 110 90 72 74  BILITOT 0.40 0.6 0.5 0.3  PROT 7.1 6.5 6.4* 6.1*  ALBUMIN 3.2* 3.1* 2.5* 2.6*   No results for input(s): LIPASE, AMYLASE in the last 168 hours. No results for input(s): AMMONIA in the last 168 hours. CBC:  Recent Labs Lab 08/03/15 1034 08/04/15 0327 08/04/15 0740 08/04/15 1821 08/06/15 0516  08/07/15 0300  WBC 8.7 9.5 8.8 7.3 5.9 5.0  NEUTROABS 7.9* 8.9*  --   --   --   --   HGB 9.9* 9.5* 7.8* 7.6* 7.9* 8.0*  HCT 31.2* 29.4* 25.1* 23.9* 25.2* 25.3*  MCV 92.6 93.6 93.7 93.7 92.6 91.3  PLT 161 171 141* 165 264 335   Cardiac Enzymes:  Recent Labs Lab 08/04/15 0336 08/04/15 1257 08/04/15 1821 08/04/15 2333  TROPONINI 0.20* 0.36* 0.34* 0.28*   BNP (last 3 results) No results for input(s): BNP in the last 8760 hours.  ProBNP (last 3 results) No results for input(s): PROBNP in the last 8760 hours.  CBG:  Recent Labs Lab 08/06/15 1703 08/06/15 2146 08/07/15 0801 08/07/15 1313 08/07/15 1654  GLUCAP 117* 123* 144* 130* 127*    Recent Results (from the past 240 hour(s))  Blood Culture (routine x 2)     Status: None   Collection Time: 08/04/15  3:27 AM  Result Value Ref Range Status   Specimen Description BLOOD LEFT FOREARM  Final   Special Requests BOTTLES DRAWN AEROBIC AND ANAEROBIC 10CC  Final   Culture  Setup Time   Final    AEROBIC BOTTLE ONLY GRAM POSITIVE COCCI IN CLUSTERS CRITICAL RESULT CALLED TO, READ BACK  BY AND VERIFIED WITH: S.KHOBHAL,RN 2221 08/04/15 M.CAMPBELL CONFIRMED BY R.GREENE    Culture METHICILLIN RESISTANT STAPHYLOCOCCUS AUREUS  Final   Report Status 08/06/2015 FINAL  Final   Organism ID, Bacteria METHICILLIN RESISTANT STAPHYLOCOCCUS AUREUS  Final      Susceptibility   Methicillin resistant staphylococcus aureus - MIC*    CIPROFLOXACIN >=8 RESISTANT Resistant     ERYTHROMYCIN >=8 RESISTANT Resistant     GENTAMICIN <=0.5 SENSITIVE Sensitive     OXACILLIN >=4 RESISTANT Resistant     TETRACYCLINE <=1 SENSITIVE Sensitive     VANCOMYCIN <=0.5 SENSITIVE Sensitive     TRIMETH/SULFA <=10 SENSITIVE Sensitive     CLINDAMYCIN <=0.25 SENSITIVE Sensitive     RIFAMPIN <=0.5 SENSITIVE Sensitive     Inducible Clindamycin NEGATIVE Sensitive     * METHICILLIN RESISTANT STAPHYLOCOCCUS AUREUS  Blood Culture (routine x 2)     Status: None  (Preliminary result)   Collection Time: 08/04/15  3:27 AM  Result Value Ref Range Status   Specimen Description BLOOD NECK  Final   Special Requests BOTTLES DRAWN AEROBIC AND ANAEROBIC 5CC  Final   Culture NO GROWTH 3 DAYS  Final   Report Status PENDING  Incomplete  Urine culture     Status: None   Collection Time: 08/04/15  3:49 AM  Result Value Ref Range Status   Specimen Description URINE, CLEAN CATCH  Final   Special Requests NONE  Final   Culture 2,000 COLONIES/mL INSIGNIFICANT GROWTH  Final   Report Status 08/05/2015 FINAL  Final  MRSA PCR Screening     Status: Abnormal   Collection Time: 08/04/15 10:02 AM  Result Value Ref Range Status   MRSA by PCR POSITIVE (A) NEGATIVE Final    Comment:        The GeneXpert MRSA Assay (FDA approved for NASAL specimens only), is one component of a comprehensive MRSA colonization surveillance program. It is not intended to diagnose MRSA infection nor to guide or monitor treatment for MRSA infections. RESULT CALLED TO, READ BACK BY AND VERIFIED WITH: NOTIFIED M. LESSARD RN 08/04/15 AT 1139 BY A. DAVIS   Culture, blood (routine x 2)     Status: None (Preliminary result)   Collection Time: 08/05/15 11:45 AM  Result Value Ref Range Status   Specimen Description BLOOD RIGHT ANTECUBITAL  Final   Special Requests BOTTLES DRAWN AEROBIC AND ANAEROBIC 10CC  Final   Culture NO GROWTH 2 DAYS  Final   Report Status PENDING  Incomplete  Culture, blood (routine x 2)     Status: None (Preliminary result)   Collection Time: 08/05/15 12:00 PM  Result Value Ref Range Status   Specimen Description BLOOD BLOOD RIGHT HAND  Final   Special Requests BOTTLES DRAWN AEROBIC AND ANAEROBIC 10CC  Final   Culture NO GROWTH 2 DAYS  Final   Report Status PENDING  Incomplete     Studies:  Recent x-ray studies have been reviewed in detail by the Attending Physician  Scheduled Meds:  Scheduled Meds: . albuterol  2.5 mg Nebulization QID  . antiseptic oral  rinse  7 mL Mouth Rinse QID  . aspirin EC  81 mg Oral Daily  . chlorhexidine gluconate  15 mL Mouth Rinse BID  . Chlorhexidine Gluconate Cloth  6 each Topical Q0600  . [START ON 08/08/2015] enoxaparin (LOVENOX) injection  50 mg Subcutaneous Q24H  . fentaNYL      . lactose free nutrition  2 Container Oral TID WC  . lidocaine-EPINEPHrine      .  LORazepam  1-3 mg Intravenous Once  . methadone  15 mg Oral Q8H  . midazolam      . modafinil  200 mg Oral Daily  . mupirocin ointment  1 application Nasal BID  . potassium chloride  50 mEq Oral BID  . tobramycin  1 drop Left Eye 6 times per day  . vancomycin  1,250 mg Intravenous Q12H    Time spent on care of this patient: 40 mins   Kalven Ganim, Geraldo Docker , MD  [facility] Office  [phone] Pager [phone]  On-Call/Text Page:      [email or web address]      password [email or web address]  If 7PM-7AM, please contact night-coverage [email or web address] Password [email or web address] 08/07/2015, 8:13 PM   LOS: 3 days   Care during the described time interval was provided by me .  I have reviewed this patient's available data, including medical history, events of note, physical examination, and all test results as part of my evaluation. I have personally reviewed and interpreted all radiology studies.   Dia Crawford, MD [phone] Pager

## 2015-08-07 NOTE — Procedures (Signed)
R Port catheter removed. No complication No blood loss. See complete dictation in [facility].

## 2015-08-07 NOTE — Consult Note (Signed)
[place]  [phone]   Requesting Provider: [facility]   Patient Care Team: [name], MD as PCP - General (Family Medicine) Leota Sauers, RN as Oncology Nurse Navigator [name], MD as Consulting Physician (Hematology and Oncology) Eppie Gibson, MD as Attending Physician (Radiation Oncology) Karie Mainland, RD as Dietitian (Nutrition)  HOSPITAL CONSULTATION  NOTE I have seen the patient, examined him and edited the notes as follows  HPI: Mr. Bantz is a 59 year old man with a history of Tongue Cancer as detailed below, presenting to the Emergency Department  on 9/3 with fever up to 102 F, chills, night sweats and shortness of breath, following a recent hospitalization from 8/19-23 for neck pain and nausea management. He denied any productive cough.  He was weak. He denied mucositis. Denied any chest pain or palpitations. Denied lower extremity swelling.  He denied any nausea or heartburn. He denied any changes in his bowel habits. Appetite is decreased due to above symptoms. He denied abdominal pain. Denied any dysuria. Denied abnormal skin rashes, or neuropathy. Denied any bleeding issues such as epistaxis, hematemesis, hematuria or hematochezia.  He has been ambulating frequently. He continues to experience back and neck pain, but these symptoms are better controlled with current pain regimen.  CT angiogram was negative for pulmonary embolism, but remarkable for multifocal pneumonia.  Blood cultures returned positive for Staphylococcus aureus. Sensitivities are pending. He was febrile, and was noted to be hypotensive. He was admitted for sepsis and healthcare associated pneumonia management. He was placed on IV fluids, IV antibiotics, antipyretics and respiratory therapy with good response. Infectious Diseases has been consulted on 9/4 for the management of MRSA bacteremia. MRI spine to be performed once clinically stable. IR to remove his  port a cath today, 9/6. He reports feeling better since admission.    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   04/13/2015 Initial Biopsy Accession [id number]:  Lymph node, needle/core biopsy - SCC, p16 positive.   04/18/2015 Initial Diagnosis Tongue cancer   04/24/2015 Imaging PET CT showed tongue cancer, lung nodule and possible bone mets   04/26/2015 Surgery He had dental extractions   05/03/2015 Imaging CT neck with contrast: L base of tongue with regional adenopathy, metastatic disease C2, C3, T2 vertebra; posterior L fourth rib.   05/09/2015 Procedure Port-a-cath placed.   05/11/2015 - 05/23/2015 Radiation Therapy He received palliative radiation therapy   06/01/2015 -  Chemotherapy He started cycle 1 of carboplatin, 5-FU and cetuximab   06/04/2015 - 06/05/2015 Hospital Admission He was admitted to the hospital after syncopal episode and had right nondisplaced fibular fracture   06/22/15 Chemotherapy S/p cycle 2 with carboplatin, 5 FU and cetuximab   07/13/15 Chemotherapy S/p cycle 3 with carboplatin, 5 FU and cetuximab   07/20/15-07/24/15 Hospital Admission He was admitted for the management of sore throat, nausea, cough and neck pain.    9/2 Imaging PET CT scan showed positive response to treatment   08/04/15 Hospital Admission He was admitted for the management of sepsis, acute hyperglycemia and healthcare associated pneumonia    Past Medical History  Diagnosis Date  . Chronic pain   . Back pain   . Chronic neck pain   . History of kidney stones   . Allergic rhinitis   . Hypertension   . Migraine headache without aura   .  Sleep apnea     does not use cpap  . GERD (gastroesophageal reflux disease)   . Neuropathy   . Arthritis   . Cancer      may 2016 tongue cancer  . Bacterial conjunctivitis of left eye 07/26/2015     MEDICATIONS:  Scheduled Meds: . albuterol  2.5 mg Nebulization QID   . antiseptic oral rinse  7 mL Mouth Rinse QID  . aspirin EC  81 mg Oral Daily  . chlorhexidine gluconate  15 mL Mouth Rinse BID  . Chlorhexidine Gluconate Cloth  6 each Topical Q0600  . enoxaparin (LOVENOX) injection  50 mg Subcutaneous Q24H  . lactose free nutrition  2 Container Oral TID WC  . modafinil  200 mg Oral Daily  . mupirocin ointment  1 application Nasal BID  . tobramycin  1 drop Left Eye 6 times per day  . vancomycin  1,250 mg Intravenous Q12H   Continuous Infusions: . sodium chloride 50 mL/hr at 08/07/15 0632   PRN Meds:.HYDROmorphone, lidocaine  ALLERGIES:  Allergies  Allergen Reactions  . Morphine And Related Anaphylaxis    Review of systems:   Constitutional: Reported fevers, chills and abnormal night sweats on admission. He reported generalized weakness. Symptoms improved since. Eyes: Denies blurriness of vision, double vision or watery eyes Ears, nose, mouth, throat, and face: Denies mucositis or sore throat.  Respiratory: Improved cough, reports dyspnea, improved since admission. Denies wheezes Cardiovascular: Denies palpitation, chest discomfort or lower extremity swelling Gastrointestinal:  Denies nausea, vomiting  or heartburn. Denies change in bowel habits. Appetite improving. Skin: Denies abnormal skin rashes Lymphatics: Denies new lymphadenopathy or easy bruising Neurological: Denies numbness, tingling or new weaknesses Musculoskeletal: Reports neck and back pain, better controlled with current pain regimen.  Behavioral/Psych: Mood is stable, no new changes  All other systems were reviewed with the patient and are negative.  ECOG PS 2  Family History  Problem Relation Age of Onset  . Cancer Father     throat ca  . Cancer Brother     pituitary ca  . Heart disease Mother      Past Surgical History  Procedure Laterality Date  . Back surgery  03/1993, 10/2002    Dr. Louanne Skye  . Tonsillectomy      as a child  . Neck surgery  2004    Dr. Louanne Skye    . Lymph node biopsy    . Nasal sinusotomy  1977  . Colonoscopy    . Multiple extractions with alveoloplasty N/A 04/26/2015    Procedure: Extraction of tooth #'s 6,11,12,15,20,21,22,27,28 with alveoloplasty;  Surgeon: [name], DDS;  Location: [facility];  Service: Oral Surgery;  Laterality: N/A;    Social History   Social History  . Marital Status: Divorced    Spouse Name: N/A  . Number of Children: 2  . Years of Education: N/A   Occupational History  . Not on file.   Social History Main Topics  . Smoking status: Never Smoker   . Smokeless tobacco: Never Used  . Alcohol Use: Yes     Comment: once a month  . Drug Use: No  . Sexual Activity: No   Other Topics Concern  . Not on file   Social History Narrative   The patient is divorced with 2 children.   Patient is disabled. Patient previously worked with Pharmacologist.   Patient lives in [place].   Patient has never smoked. Patient has never used smokeless tobacco.  Patient with occasional use of alcohol.        PHYSICAL EXAMINATION:  Filed Vitals:   08/07/15 0655  BP:   Pulse: 80  Temp:   Resp: 9      Intake/Output Summary (Last 24 hours) at 08/07/15 0730 Last data filed at 08/07/15 8938  Gross per 24 hour  Intake 3656.67 ml  Output   2250 ml  Net 1406.67 ml     GENERAL: alert, no distress and comfortable. Chronically ill appearing SKIN: skin color is pale, texture, turgor is normal, no rashes or significant lesions EYES: normal, conjunctiva are pink and non-injected, sclera clear OROPHARYNX: no exudate, no erythema and lips, buccal mucosa, and tongue normal.   NECK: supple, thyroid normal size, non-tender, without nodularity LYMPH:  no palpable lymphadenopathy in the cervical, axillary or inguinal LUNGS: essentially clear to auscultation and percussion with normal breathing effort HEART: regular rate & rhythm and no murmurs and no lower extremity edema ABDOMEN: soft, non-tender and normal  bowel sounds Musculoskeletal:no cyanosis of digits and no clubbing.   PSYCH: alert & oriented x 3 with fluent speech NEURO: no focal motor/sensory deficits  LABORATORY/RADIOLOGY DATA:   Recent Labs Lab 08/03/15 1034 08/04/15 0327 08/04/15 0740 08/04/15 1821 08/06/15 0516 08/07/15 0300  WBC 8.7 9.5 8.8 7.3 5.9 5.0  HGB 9.9* 9.5* 7.8* 7.6* 7.9* 8.0*  HCT 31.2* 29.4* 25.1* 23.9* 25.2* 25.3*  PLT 161 171 141* 165 264 335  MCV 92.6 93.6 93.7 93.7 92.6 91.3  MCH 29.4 30.3 29.1 29.8 29.0 28.9  MCHC 31.7* 32.3 31.1 31.8 31.3 31.6  RDW 17.4* 17.6* 17.7* 17.6* 17.5* 17.5*  LYMPHSABS 0.4* 0.3*  --   --   --   --   MONOABS 0.4 0.4  --   --   --   --   EOSABS 0.0 0.0  --   --   --   --   BASOSABS 0.0 0.0  --   --   --   --     CMP    Recent Labs Lab 08/03/15 1034 08/04/15 0327 08/04/15 0740 08/05/15 0643 08/06/15 0519 08/07/15 0300  NA 139 135 135  --  138 139  K 4.5 4.1 4.3  --  3.1* 3.3*  CL  --  97* 104  --  99* 103  CO2 29 29 28   --  30 27  GLUCOSE 173* 201* 167*  --  104* 111*  BUN 10.4 13 13   --  <5* <5*  CREATININE 1.2 1.30* 1.21  --  0.90 0.86  CALCIUM 9.3 8.7* 7.7*  --  8.5* 8.8*  MG 1.6  --   --  1.4*  --  1.3*  AST 17 21  --   --  34 32  ALT 15 14*  --   --  16* 16*  ALKPHOS 110 90  --   --  72 74  BILITOT 0.40 0.6  --   --  0.5 0.3        Component Value Date/Time   BILITOT 0.3 08/07/2015 0300   BILITOT 0.40 08/03/2015 1034      Liver Function Tests:  Recent Labs Lab 08/03/15 1034 08/04/15 0327 08/06/15 0519 08/07/15 0300  AST 17 21 34 32  ALT 15 14* 16* 16*  ALKPHOS 110 90 72 74  BILITOT 0.40 0.6 0.5 0.3  PROT 7.1 6.5 6.4* 6.1*  ALBUMIN 3.2* 3.1* 2.5* 2.6*     Radiology Studies:  Ct Angio Chest Pe W/cm &/  or Wo Cm  08/04/2015   COMPARISON:  Chest radiograph August 04, 2015 at 3:11 p.m.  FINDINGS: Suboptimal pulmonary arterial contrast opacification. Main pulmonary artery is not enlarged. No pulmonary arterial filling defects to the  segmental branches, limited assessment of distal vessels due to contrast bolus timing.  The heart is mildly enlarged. Mild coronary artery calcifications. No pericardial fluid collections. No lymphadenopathy by CT size criteria.  Patchy consolidation RIGHT upper lobe with air bronchogram. Patchy ground-glass opacities RIGHT middle lobe. Pleural thickening and atelectasis in the lung bases with patchy LEFT lower lobe ground-glass opacities. Tracheobronchial tree is patent and midline. No pneumothorax.  Included view of the abdomen is normal. Ill-defined sclerosis RIGHT T11 pedicle. Subcentimeter sclerotic lesion LEFT T4. Scleroses and probable large T2 inferior endplate Schmorl's node. Inferior manubrial focal sclerosis. Subacute RIGHT posterior rib fractures. Single lumen RIGHT chest Port-A-Cath.  Review of the MIP images confirms the above findings.  IMPRESSION: Suboptimal contrast bolus timing without pulmonary embolism to at least the segmental branches.  Multifocal consolidation lungs compatible with pneumonia. Additional atelectasis/scarring.  Sclerotic lesions in the spine concerning for metastatic disease. Possible manubrial metastasis. These would better be better characterized on MRI of the thoracic spine as clinically indicated, on a nonemergent basis.  Mild cardiomegaly.   Electronically Signed   By: Elon Alas M.D.   On: 08/04/2015 06:24   08/04/15 TTE  with grade 1 diastolic dysfunction; thickened aortic valve with mild AI; mild biatrial enlargement; mild MR.  ASSESSMENT AND PLAN:  Severe Sepsis due to MRSA Bacteremia Multifocal Healthcare Associated Pneumonia Acute hypoxic respiratory failure in the setting of pneumonia and severe sleep apnea CT angiogram was negative for pulmonary embolism, but remarkable for multifocal pneumonia.   Blood cultures returned positive for Staphylococcus aureus. Sensitivities are pending.  He was placed on IV fluids, IV antibiotics, antipyretics and  respiratory therapy including BiPAP with good response. Infectious Diseases has been consulted on 9/4 for the the management of MRSA bacteremia. Appreciate Dr. Algis Downs care  MRI spine to be performed once clinically stable to rule out any potential Staph discitis, spinal osteomyelitis versus epidural process.  IR removed his port a cath today, 9/6.  He reports feeling better since admission, will continue to follow. Appreciate primary team's involvement  Tongue cancer Treatment was paced on hold until he recovers from recent side effects of treatment, and recurrent hospitalizations, last chemo was on 07/13/15 PET CT scan on 9/2 showed positive response to treatment Plan to see him upon discharge for further discussion. Will present his case at ENT tumor board for further discussion tomorrow  Chronic neck pain, controlled He has chronic neck back pain, poorly controlled with Fentanyl patch. Recently I switched him to methadone. Then he came in with unresponsive episode, ?overdose. This is better controlled with current pain regimen. MRI spine to be performed today to rule out any potential Staph discitis, spinal osteomyelitis versus epidural process Recommend palliative care team consult for assistance in chronic pain management  Hypomagnesemia He has low magnesium likely related to recent side effects of treatment. He received IV fluids and IV magnesium replacement with good response Current Mg levels are 1.3 prior day Continue magnesium replacement therapy as per primary team  Hypokalemia Continue to replenish as needed  Anemia in neoplastic disease In the setting of recent treatment, infection, and dilution No bleeding issues are reported at this time Current Hb is 8.0 He may need transfusion as he continues to be symptomatic. Will continue to monitor.  DVT prophylaxis On Lovenox  Malnutrition Consider Nutrition evaluation  Deconditioning Consider OT/PT  involvement  Full Code Other medical issues as per admitting team. Will follow  [facility] E, PA-C 08/07/2015, 7:30 AM  Novah Nessel, MD 08/07/2015

## 2015-08-07 NOTE — Consult Note (Signed)
Chief Complaint: Patient was seen in consultation today for [facility] removal; bacteremia Chief Complaint  Patient presents with  . Shortness of Breath   at the request of Dr Thereasa Solo; Dr Johnnye Sima  Referring Physician(s): McClung/ Hatcher  History of Present Illness: Shane Cantu is a 59 y.o. male   Port a catheter placed 05/09/15 in IR Hx tongue cancer; need for chemotherapy Pt noted fever; N/V Admitted 08/04/15: sepsis: MRSA bacteremia Now for [facility] removal requested per Dr Johnnye Sima and Dr Thereasa Solo  Past Medical History  Diagnosis Date  . Chronic pain   . Back pain   . Chronic neck pain   . History of kidney stones   . Allergic rhinitis   . Hypertension   . Migraine headache without aura   . Sleep apnea     does not use cpap  . GERD (gastroesophageal reflux disease)   . Neuropathy   . Arthritis   . Cancer      may 2016 tongue cancer  . Bacterial conjunctivitis of left eye 07/26/2015    Past Surgical History  Procedure Laterality Date  . Back surgery  03/1993, 10/2002    Dr. Louanne Skye  . Tonsillectomy      as a child  . Neck surgery  2004    Dr. Louanne Skye  . Lymph node biopsy    . Nasal sinusotomy  1977  . Colonoscopy    . Multiple extractions with alveoloplasty N/A 04/26/2015    Procedure: Extraction of tooth #'s 6,11,12,15,20,21,22,27,28 with alveoloplasty;  Surgeon: [name], DDS;  Location: [facility];  Service: Oral Surgery;  Laterality: N/A;    Allergies: Morphine and related  Medications: Prior to Admission medications   Medication Sig Start Date End Date Taking? Authorizing Provider  amLODipine (NORVASC) 5 MG tablet Take 5 mg by mouth daily.    Yes Historical Provider, MD  Armodafinil 250 MG tablet Take 250 mg by mouth daily.   Yes Historical Provider, MD  aspirin EC 81 MG tablet Take 81 mg by mouth daily.   Yes Historical Provider, MD  clindamycin (CLINDAGEL) 1 % gel APPLY TO AFFECTED AREA(S) TWICE DAILY 06/25/15  Yes [name], MD  diphenhydrAMINE (BENADRYL) 25  mg capsule Take 25 mg by mouth every 6 (six) hours as needed for itching.   Yes Historical Provider, MD  famotidine (PEPCID) 20 MG tablet Take 20 mg by mouth daily as needed (burning sensation).    Yes Historical Provider, MD  fluticasone (FLONASE) 50 MCG/ACT nasal spray Place 2 sprays into both nostrils daily. 07/24/15  Yes Florencia Reasons, MD  gabapentin (NEURONTIN) 100 MG capsule Take 600 mg by mouth 2 (two) times daily. Takes with supper and at bedtime   Yes Historical Provider, MD  guaiFENesin (MUCINEX) 600 MG 12 hr tablet Take 1 tablet (600 mg total) by mouth 2 (two) times daily. 07/24/15  Yes Florencia Reasons, MD  hydrocortisone cream 0.5 % Apply topically 3 (three) times daily. 06/15/15  Yes [name], MD  ibuprofen (ADVIL,MOTRIN) 200 MG tablet Take 600 mg by mouth every 6 (six) hours as needed for mild pain or moderate pain.    Yes Historical Provider, MD  lansoprazole (PREVACID) 30 MG capsule Take 30 mg by mouth daily at 12 noon.   Yes Historical Provider, MD  lidocaine (XYLOCAINE) 2 % solution Patient: Mix 1part 2% viscous lidocaine, 1part H20. Swish and/or swallow 66mL of this mixture, 55min before meals and at bedtime, up to QID Patient taking differently: Use as  directed 20 mLs in the mouth or throat every 6 (six) hours as needed. Patient: Mix 1part 2% viscous lidocaine, 1part H20. Swish and/or swallow 61mL of this mixture, 52min before meals and at bedtime, up to QID 05/14/15  Yes Eppie Gibson, MD  lidocaine-prilocaine (EMLA) cream Apply 1 application topically as needed. To Port a cath site one hour prior to needle stick 05/09/15  Yes [name], MD  Magnesium Oxide 400 MG CAPS Take 1 capsule (400 mg total) by mouth daily. 07/24/15  Yes Florencia Reasons, MD  methadone (DOLOPHINE) 10 MG tablet Take 3 tablets (30 mg total) by mouth every 8 (eight) hours. Patient taking differently: Take 40 mg by mouth every 8 (eight) hours.  08/03/15  Yes [name], MD  metoCLOPramide (REGLAN) 10 MG tablet Take 1 tablet (10 mg total) by  mouth 4 (four) times daily. 08/03/15  Yes [name], MD  Misc. Devices (PUMP IN STYLE ADVANCED) MISC Inject 1 cartridge into the vein See admin instructions. Adrucil 9350 mg  2.42ml/hr. Runs for 96 hours   Yes Historical Provider, MD  ondansetron (ZOFRAN) 8 MG tablet Take 1 tablet (8 mg total) by mouth every 8 (eight) hours as needed for nausea. 05/31/15  Yes [name], MD  polyethylene glycol powder (GLYCOLAX/MIRALAX) powder Take 17 g by mouth 2 (two) times daily. 04/06/13  Yes Veryl Speak, MD  promethazine (PHENERGAN) 25 MG tablet Take 1 tablet (25 mg total) by mouth every 6 (six) hours as needed for nausea. 05/31/15  Yes [name], MD  tobramycin (TOBREX) 0.3 % ophthalmic solution Place 1 drop into the left eye every 4 (four) hours. 07/26/15  Yes [name], MD  traZODone (DESYREL) 100 MG tablet Take 100 mg by mouth at bedtime.   Yes Historical Provider, MD  HYDROmorphone (DILAUDID) 4 MG tablet Take 1 tablet (4 mg total) by mouth every 4 (four) hours as needed for severe pain. Patient not taking: Reported on 08/04/2015 08/03/15   [name], MD  lidocaine (LIDODERM) 5 % Place 1 patch onto the skin daily. Remove & Discard patch within 12 hours or as directed by MD Patient not taking: Reported on 08/04/2015 05/16/15   [name], MD  scopolamine (TRANSDERM-SCOP) 1 MG/3DAYS Place 1 patch (1.5 mg total) onto the skin every 3 (three) days. Patient not taking: Reported on 08/04/2015 06/29/15   [name], MD  sucralfate (CARAFATE) 1 G tablet Dissolve 1 tablet in 10 mL H20 and swallow up to QID as needed for sore throat Patient not taking: Reported on 08/04/2015 05/14/15   Eppie Gibson, MD     Family History  Problem Relation Age of Onset  . Cancer Father     throat ca  . Cancer Brother     pituitary ca  . Heart disease Mother     Social History   Social History  . Marital Status: Divorced    Spouse Name: N/A  . Number of Children: 2  . Years of Education: N/A   Social History Main Topics  . Smoking  status: Never Smoker   . Smokeless tobacco: Never Used  . Alcohol Use: Yes     Comment: once a month  . Drug Use: No  . Sexual Activity: No   Other Topics Concern  . None   Social History Narrative   The patient is divorced with 2 children.   Patient is disabled. Patient previously worked with Pharmacologist.   Patient lives in [place].   Patient has never  smoked. Patient has never used smokeless tobacco.   Patient with occasional use of alcohol.        Review of Systems: A 12 point ROS discussed and pertinent positives are indicated in the HPI above.  All other systems are negative.  Review of Systems  Constitutional: Positive for fever, chills, activity change and fatigue.  Respiratory: Negative for shortness of breath.   Gastrointestinal: Negative for abdominal pain.  Neurological: Positive for weakness.  Psychiatric/Behavioral: Negative for behavioral problems and confusion.    Vital Signs: BP 145/85 mmHg  Pulse 83  Temp(Src) 98.8 F (37.1 C) (Oral)  Resp 15  Ht 5\' 9"  (1.753 m)  Wt 231 lb 0.7 oz (104.8 kg)  BMI 34.10 kg/m2  SpO2 100%  Physical Exam  Constitutional: He is oriented to person, place, and time.  Cardiovascular: Normal rate and regular rhythm.   Pulmonary/Chest: Effort normal and breath sounds normal. He has no wheezes.  Abdominal: Soft. Bowel sounds are normal.  Musculoskeletal: Normal range of motion.  Neurological: He is alert and oriented to person, place, and time.  Skin: Skin is warm and dry.  Psychiatric: He has a normal mood and affect. His behavior is normal. Judgment and thought content normal.  Nursing note and vitals reviewed.   Mallampati Score:  MD Evaluation Airway: WNL Heart: WNL Abdomen: WNL Chest/ Lungs: WNL ASA  Classification: 3 Mallampati/Airway Score: One  Imaging: Dg Chest 2 View  08/05/2015   CLINICAL DATA:  Cough, chest congestion and shortness of breath.  EXAM: CHEST  2 VIEW  COMPARISON:  Yesterday.   FINDINGS: Improved inspiration. Normal sized heart. Mildly prominent interstitial markings and mild central peribronchial thickening without significant change. Decreased airspace opacity at the medial right lung base with persistent opacity seen in the right middle lobe on the lateral view. Interval minimal patchy opacity in the right upper lobe. The left lung base is currently clear. Mild thoracic spine degenerative changes. Stable right jugular porta catheter.  IMPRESSION: 1. Improved aeration at the right lung base with residual patchy opacity in the right middle lobe concerning for pneumonia. 2. Interval minimal right upper lobe pneumonia. 3. Mild chronic bronchitic changes.   Electronically Signed   By: Claudie Revering M.D.   On: 08/05/2015 09:42   Dg Chest 2 View  07/22/2015   CLINICAL DATA:  Patient with cough, congestion and sore throat.  EXAM: CHEST  2 VIEW  COMPARISON:  Chest radiograph 06/04/2015  FINDINGS: Anterior chest wall Port-A-Cath is stable in position with tip projecting over the superior vena cava. Stable cardiac and mediastinal contours. No consolidative pulmonary opacities. No pleural effusion or pneumothorax. Mid thoracic spine degenerative changes.  IMPRESSION: No acute cardiopulmonary process.   Electronically Signed   By: Lovey Newcomer M.D.   On: 07/22/2015 15:07   Dg Ankle Complete Right  07/23/2015   CLINICAL DATA:  Right ankle fracture.  EXAM: RIGHT ANKLE - COMPLETE 3+ VIEW  COMPARISON:  06/04/2015  FINDINGS: Oblique fracture of the lateral malleolus, Weber B morphology. Medial mortise remains widened at 7 mm, indicating deltoid ligament tear. There is some early healing response along the lateral malleolar fracture without union. I do not observe a posterior malleolar component of the fracture.  Plantar and Achilles calcaneal spurs. No other fractures identified.  IMPRESSION: 1. Oblique lateral malleolar fracture compatible with Weber B injury (supination -external rotation).  Abnormally widened medial mortise indicating deltoid ligament tear. This fracture is typically considered unstable.   Electronically Signed   By:  Van Clines M.D.   On: 07/23/2015 13:46   Ct Angio Chest Pe W/cm &/or Wo Cm  08/04/2015   CLINICAL DATA:  Shortness of breath, history of stage IV head and neck cancer.  EXAM: CT ANGIOGRAPHY CHEST WITH CONTRAST  TECHNIQUE: Multidetector CT imaging of the chest was performed using the standard protocol during bolus administration of intravenous contrast. Multiplanar CT image reconstructions and MIPs were obtained to evaluate the vascular anatomy. Per technologist note, patient has power port was injected and leak, which Re occurred on second attempt.  CONTRAST:  183mL OMNIPAQUE IOHEXOL 350 MG/ML SOLN  COMPARISON:  Chest radiograph August 04, 2015 at 3:11 p.m.  FINDINGS: Suboptimal pulmonary arterial contrast opacification. Main pulmonary artery is not enlarged. No pulmonary arterial filling defects to the segmental branches, limited assessment of distal vessels due to contrast bolus timing.  The heart is mildly enlarged. Mild coronary artery calcifications. No pericardial fluid collections. No lymphadenopathy by CT size criteria.  Patchy consolidation RIGHT upper lobe with air bronchogram. Patchy ground-glass opacities RIGHT middle lobe. Pleural thickening and atelectasis in the lung bases with patchy LEFT lower lobe ground-glass opacities. Tracheobronchial tree is patent and midline. No pneumothorax.  Included view of the abdomen is normal. Ill-defined sclerosis RIGHT T11 pedicle. Subcentimeter sclerotic lesion LEFT T4. Scleroses and probable large T2 inferior endplate Schmorl's node. Inferior manubrial focal sclerosis. Subacute RIGHT posterior rib fractures. Single lumen RIGHT chest Port-A-Cath.  Review of the MIP images confirms the above findings.  IMPRESSION: Suboptimal contrast bolus timing without pulmonary embolism to at least the segmental branches.   Multifocal consolidation lungs compatible with pneumonia. Additional atelectasis/scarring.  Sclerotic lesions in the spine concerning for metastatic disease. Possible manubrial metastasis. These would better be better characterized on MRI of the thoracic spine as clinically indicated, on a nonemergent basis.  Mild cardiomegaly.   Electronically Signed   By: Elon Alas M.D.   On: 08/04/2015 06:24   Nm Pet Image Restag (ps) Skull Base To Thigh  08/03/2015   CLINICAL DATA:  Subsequent treatment strategy for restaging of tongue cancer.  EXAM: NUCLEAR MEDICINE PET SKULL BASE TO THIGH  TECHNIQUE: 12.1 mCi F-18 FDG was injected intravenously. Full-ring PET imaging was performed from the skull base to thigh after the radiotracer. CT data was obtained and used for attenuation correction and anatomic localization.  FASTING BLOOD GLUCOSE:  Value: 108 mg/dl  COMPARISON:  04/24/2015.  FINDINGS: NECK  Improvement to resolution of left tongue base primary. Vague hyper metabolism remains measuring a S.U.V. max of 3.9. On the prior exam, this measured a S.U.V. max of 9.7.  Marked improvement in left-sided cervical nodal hypermetabolism. A left-sided jugulodigastric node measures 8 mm and a S.U.V. max of 2.8. On the prior exam, a nodal mass at this location measured a S.U.V. max of 13.4.  A right jugulodigastric node measures 7 mm and a S.U.V. max of 4.0 on image 37. This measured similar in size and was not hypermetabolic on the prior exam.  A right posterior triangle node measures 6 mm and a S.U.V. max of 2.1 on image 41. This node measured similar in size and was not hypermetabolic on the prior.  CHEST  Resolution of previously described hypermetabolic right lower lobe pulmonary nodule. Hypermetabolism corresponding to probable posterior right upper lobe nodularity. This measures a S.U.V. max of 3.3, including on image 31. No thoracic nodal hypermetabolism.  ABDOMEN/PELVIS  A porta hepatis node measures 1.0 cm and a  S.U.V. max of 3.4 on 117.  This node was similar in size and not hypermetabolic on the prior.  SKELETON  Improved osseous metastasis. Previously described T2 and E52 hypermetabolic lesions are improved to resolved. There is multi focal primarily right-sided muscular and soft tissue activity which could relate to motion after radiopharmaceutical injection and/or trauma. New hypermetabolism at the site of the healing posterior right ninth rib fracture on image 93 of series 4.  CT IMAGES PERFORMED FOR ATTENUATION CORRECTION  Mucosal thickening and fluid in the right maxillary sinus. Mild cardiomegaly with multivessel coronary artery atherosclerosis. Lower pole left renal collecting system calculus. Probable right nephrolithiasis as well. Increased density in the jejunal mesenteric fat. Example image 131. This may be slightly increased. Left-sided lumbar spine fixation. Interval sclerosis in the right-sided the T11 vertebral body secondary to healing of metastasis. Healing pathologic fracture at the fourth posterior left rib.  IMPRESSION: 1. Response to therapy of tongue base primary, left cervical nodal metastasis, and osseous metastasis. A resolved right lower lobe lung nodule was likely due to an isolated metastasis. 2. New right-sided cervical nodal hypermetabolism, suspicious for metastatic disease. 3. Posterior right upper lobe mild hypermetabolism, likely corresponding to minimal nodularity. Correlate with symptoms of infection or possible aspiration. 4. Isolated porta hepatis hypermetabolic node is indeterminate. Favored to be reactive. An atypical distribution of metastasis could look similar. 5. Incidental findings, including coronary artery disease and nephrolithiasis. 6. Increased density in the jejunal mesenteric fat could represent mesenteric adenitis/panniculitis.   Electronically Signed   By: Abigail Miyamoto M.D.   On: 08/03/2015 11:59   Dg Chest Port 1 View  08/04/2015   CLINICAL DATA:  Acute onset of  shortness of breath. Initial encounter.  EXAM: PORTABLE CHEST - 1 VIEW  COMPARISON:  Chest radiograph performed 07/22/2015, and PET/CT performed 08/02/2015  FINDINGS: The lungs are well-aerated. Right basilar airspace opacity raises concern for pneumonia. Obscuration of the left hemidiaphragm may also reflect airspace opacity. No definite pleural effusion or pneumothorax is seen.  The cardiomediastinal silhouette is borderline normal in size. A right-sided chest port is seen ending about the distal SVC. No acute osseous abnormalities are seen.  IMPRESSION: Right basilar airspace opacity raises concern for pneumonia. Obscuration of the left hemidiaphragm may also reflect airspace opacity. Followup PA and lateral chest X-ray is recommended in 3-4 weeks following trial of antibiotic therapy to ensure resolution and exclude underlying new or recurrent malignancy.   Electronically Signed   By: Garald Balding M.D.   On: 08/04/2015 03:15    Labs:  CBC:  Recent Labs  08/04/15 0740 08/04/15 1821 08/06/15 0516 08/07/15 0300  WBC 8.8 7.3 5.9 5.0  HGB 7.8* 7.6* 7.9* 8.0*  HCT 25.1* 23.9* 25.2* 25.3*  PLT 141* 165 264 335    COAGS:  Recent Labs  04/13/15 1322 05/09/15 0855 06/04/15 0506 06/04/15 1015  INR 1.12 1.10 >10.00* 1.13  APTT 30  --   --  26    BMP:  Recent Labs  08/04/15 0327 08/04/15 0740 08/06/15 0519 08/07/15 0300  NA 135 135 138 139  K 4.1 4.3 3.1* 3.3*  CL 97* 104 99* 103  CO2 29 28 30 27   GLUCOSE 201* 167* 104* 111*  BUN 13 13 <5* <5*  CALCIUM 8.7* 7.7* 8.5* 8.8*  CREATININE 1.30* 1.21 0.90 0.86  GFRNONAA 58* >60 >60 >60  GFRAA >60 >60 >60 >60    LIVER FUNCTION TESTS:  Recent Labs  08/03/15 1034 08/04/15 0327 08/06/15 0519 08/07/15 0300  BILITOT 0.40 0.6 0.5 0.3  AST 17 21 34 32  ALT 15 14* 16* 16*  ALKPHOS 110 90 72 74  PROT 7.1 6.5 6.4* 6.1*  ALBUMIN 3.2* 3.1* 2.5* 2.6*    TUMOR MARKERS: No results for input(s): AFPTM, CEA, CA199, CHROMGRNA in  the last 8760 hours.  Assessment and Plan:  Sepsis MRSA Bacteremia Scheduled for PAC removal in IR Pt is aware of procedure benefits and risks including but not limited to: Infection; bleeding; vessel damage; damage to surrounding structures Agreeable to proceed Consent signed andin chart  Hx tongue ca   Thank you for this interesting consult.  I greatly enjoyed meeting French Kendra Boye and look forward to participating in their care.  A copy of this report was sent to the requesting provider on this date.  Signed: Juventino Pavone A 08/07/2015, 10:24 AM   I spent a total of 40 Minutes    in face to face in clinical consultation, greater than 50% of which was counseling/coordinating care for [facility] removal

## 2015-08-07 NOTE — Progress Notes (Addendum)
Administered Ativan 2 mg IV prior to leaving for MRI at 2200. MRI called RN stating that pt is still anxious and not holding still for the images. Text paged Schorr, NP, who ordered an additional Ativan 2 mg IV. RN went to MRI and administered the medication. With a total of 4 mg of Ativan, MRI was still unsuccessful. Radiology tech stated that he, "tried to climb out of the scanner" and had to end the exam. Pt was returned to room.   Hart Rochester, RN, BSN

## 2015-08-07 NOTE — Progress Notes (Signed)
INFECTIOUS DISEASE PROGRESS NOTE  ID: Shane Cantu is a 59 y.o. male with  Principal Problem:   Sepsis Active Problems:   Tongue cancer   Chronic neck pain   Anemia due to antineoplastic chemotherapy   Chemotherapy-induced nausea   HCAP (healthcare-associated pneumonia)   Acute respiratory failure with hypoxia   Elevated troponin   Acute hyperglycemia   RBBB   OSA (obstructive sleep apnea)   Closed right ankle fracture   HTN (hypertension)   QT prolongation   Bacteremia   Metastatic cancer   Staphylococcus aureus bacteremia with sepsis   Infection due to portacath   Screen for STD (sexually transmitted disease)   MRSA bacteremia   Acute dyspnea  Subjective: No complaints  Abtx:  Anti-infectives    Start     Dose/Rate Route Frequency Ordered Stop   08/06/15 0630  vancomycin (VANCOCIN) 1,250 mg in sodium chloride 0.9 % 250 mL IVPB     1,250 mg 166.7 mL/hr over 90 Minutes Intravenous Every 12 hours 08/06/15 0627     08/05/15 1400  ceFAZolin (ANCEF) IVPB 2 g/50 mL premix  Status:  Discontinued     2 g 100 mL/hr over 30 Minutes Intravenous 3 times per day 08/05/15 1044 08/06/15 0919   08/04/15 1800  vancomycin (VANCOCIN) IVPB 750 mg/150 ml premix  Status:  Discontinued     750 mg 150 mL/hr over 60 Minutes Intravenous Every 12 hours 08/04/15 0731 08/06/15 0627   08/04/15 1200  piperacillin-tazobactam (ZOSYN) IVPB 3.375 g  Status:  Discontinued     3.375 g 12.5 mL/hr over 240 Minutes Intravenous 3 times per day 08/04/15 0731 08/05/15 1037   08/04/15 0315  piperacillin-tazobactam (ZOSYN) IVPB 3.375 g     3.375 g 100 mL/hr over 30 Minutes Intravenous  Once 08/04/15 0300 08/04/15 0404   08/04/15 0315  vancomycin (VANCOCIN) IVPB 1000 mg/200 mL premix  Status:  Discontinued     1,000 mg 200 mL/hr over 60 Minutes Intravenous  Once 08/04/15 0300 08/04/15 0309   08/04/15 0315  vancomycin (VANCOCIN) 1,500 mg in sodium chloride 0.9 % 500 mL IVPB     1,500 mg 250 mL/hr over 120  Minutes Intravenous  Once 08/04/15 0309 08/04/15 0543      Medications:  Scheduled: . albuterol  2.5 mg Nebulization QID  . antiseptic oral rinse  7 mL Mouth Rinse QID  . aspirin EC  81 mg Oral Daily  . chlorhexidine gluconate  15 mL Mouth Rinse BID  . Chlorhexidine Gluconate Cloth  6 each Topical Q0600  . enoxaparin (LOVENOX) injection  50 mg Subcutaneous Q24H  . lactose free nutrition  2 Container Oral TID WC  . magnesium sulfate 1 - 4 g bolus IVPB  3 g Intravenous Once  . modafinil  200 mg Oral Daily  . mupirocin ointment  1 application Nasal BID  . potassium chloride  50 mEq Oral BID  . tobramycin  1 drop Left Eye 6 times per day  . vancomycin  1,250 mg Intravenous Q12H    Objective: Vital signs in last 24 hours: Temp:  [98.1 F (36.7 C)-99.2 F (37.3 C)] 98.8 F (37.1 C) (09/06 0800) Pulse Rate:  [65-83] 83 (09/06 0800) Resp:  [9-21] 15 (09/06 0800) BP: (129-162)/(70-85) 145/85 mmHg (09/06 0800) SpO2:  [96 %-100 %] 100 % (09/06 0800) FiO2 (%):  [40 %] 40 % (09/06 0944)   General appearance: alert, cooperative and no distress Resp: clear to auscultation bilaterally Chest wall: no tenderness,  port port clean.  Cardio: regular rate and rhythm GI: normal findings: bowel sounds normal and soft, non-tender  Lab Results  Recent Labs  08/06/15 0516 08/06/15 0519 08/07/15 0300  WBC 5.9  --  5.0  HGB 7.9*  --  8.0*  HCT 25.2*  --  25.3*  NA  --  138 139  K  --  3.1* 3.3*  CL  --  99* 103  CO2  --  30 27  BUN  --  <5* <5*  CREATININE  --  0.90 0.86   Liver Panel  Recent Labs  08/06/15 0519 08/07/15 0300  PROT 6.4* 6.1*  ALBUMIN 2.5* 2.6*  AST 34 32  ALT 16* 16*  ALKPHOS 72 74  BILITOT 0.5 0.3   Sedimentation Rate  Recent Labs  08/06/15 0519  ESRSEDRATE 122*   C-Reactive Protein  Recent Labs  08/06/15 0519  CRP 19.1*    Microbiology: Recent Results (from the past 240 hour(s))  Blood Culture (routine x 2)     Status: None   Collection  Time: 08/04/15  3:27 AM  Result Value Ref Range Status   Specimen Description BLOOD LEFT FOREARM  Final   Special Requests BOTTLES DRAWN AEROBIC AND ANAEROBIC 10CC  Final   Culture  Setup Time   Final    AEROBIC BOTTLE ONLY GRAM POSITIVE COCCI IN CLUSTERS CRITICAL RESULT CALLED TO, READ BACK BY AND VERIFIED WITH: [place] 2221 08/04/15 M.CAMPBELL CONFIRMED BY R.GREENE    Culture METHICILLIN RESISTANT STAPHYLOCOCCUS AUREUS  Final   Report Status 08/06/2015 FINAL  Final   Organism ID, Bacteria METHICILLIN RESISTANT STAPHYLOCOCCUS AUREUS  Final      Susceptibility   Methicillin resistant staphylococcus aureus - MIC*    CIPROFLOXACIN >=8 RESISTANT Resistant     ERYTHROMYCIN >=8 RESISTANT Resistant     GENTAMICIN <=0.5 SENSITIVE Sensitive     OXACILLIN >=4 RESISTANT Resistant     TETRACYCLINE <=1 SENSITIVE Sensitive     VANCOMYCIN <=0.5 SENSITIVE Sensitive     TRIMETH/SULFA <=10 SENSITIVE Sensitive     CLINDAMYCIN <=0.25 SENSITIVE Sensitive     RIFAMPIN <=0.5 SENSITIVE Sensitive     Inducible Clindamycin NEGATIVE Sensitive     * METHICILLIN RESISTANT STAPHYLOCOCCUS AUREUS  Blood Culture (routine x 2)     Status: None (Preliminary result)   Collection Time: 08/04/15  3:27 AM  Result Value Ref Range Status   Specimen Description BLOOD NECK  Final   Special Requests BOTTLES DRAWN AEROBIC AND ANAEROBIC 5CC  Final   Culture NO GROWTH 2 DAYS  Final   Report Status PENDING  Incomplete  Urine culture     Status: None   Collection Time: 08/04/15  3:49 AM  Result Value Ref Range Status   Specimen Description URINE, CLEAN CATCH  Final   Special Requests NONE  Final   Culture 2,000 COLONIES/mL INSIGNIFICANT GROWTH  Final   Report Status 08/05/2015 FINAL  Final  MRSA PCR Screening     Status: Abnormal   Collection Time: 08/04/15 10:02 AM  Result Value Ref Range Status   MRSA by PCR POSITIVE (A) NEGATIVE Final    Comment:        The GeneXpert MRSA Assay (FDA approved for NASAL  specimens only), is one component of a comprehensive MRSA colonization surveillance program. It is not intended to diagnose MRSA infection nor to guide or monitor treatment for MRSA infections. RESULT CALLED TO, READ BACK BY AND VERIFIED WITH: NOTIFIED Rhae Lerner RN 08/04/15 AT 1139  BY A. DAVIS   Culture, blood (routine x 2)     Status: None (Preliminary result)   Collection Time: 08/05/15 11:45 AM  Result Value Ref Range Status   Specimen Description BLOOD RIGHT ANTECUBITAL  Final   Special Requests BOTTLES DRAWN AEROBIC AND ANAEROBIC 10CC  Final   Culture NO GROWTH 1 DAY  Final   Report Status PENDING  Incomplete  Culture, blood (routine x 2)     Status: None (Preliminary result)   Collection Time: 08/05/15 12:00 PM  Result Value Ref Range Status   Specimen Description BLOOD BLOOD RIGHT HAND  Final   Special Requests BOTTLES DRAWN AEROBIC AND ANAEROBIC 10CC  Final   Culture NO GROWTH 1 DAY  Final   Report Status PENDING  Incomplete    Studies/Results: No results found.   Assessment/Plan: MRSA Bacteremia  Suspect related to his port, although his previous sputum Cx (8-22) also showed MRSA  Repeat BCx 9-4 are ngtd  Consider TEE if he can tolerate. IE not mentioned on his TTE  Length of therapy to be determined by his imaging  Ancef d/c on 9-5  Porta-Cath  Would remove  Place temporary IV until BCx clear  Low back, neck pain   Await results of MRIs  Multifocal pneumonia  Improved, some residual in RML  Hypokalemia  Will be repleted  Anemia  Continue to monitor  Tongue Ca (stage IV)  Last CTX 8-12 (5FU, Carboplatinum, Cetuximab)  Total days of antibiotics: 3 vanco         Bobby Rumpf Infectious Diseases (pager) [phone] [email or web address] 08/07/2015, 9:54 AM  LOS: 3 days

## 2015-08-07 NOTE — Telephone Encounter (Signed)
  Oncology Nurse Navigator Documentation   Navigator Encounter Type: Telephone (08/07/15 0809)     Patient's dtr Amy called to confirm her dad's situation since adx early 9/2 r/t complaint of SOB. She stated EMT took him to [facility] bco of cardiac concerns. She correctly understands he is being treated for PNA, is having PAC removed today d/t sepsis. I will continue to follow during this admission.  Gayleen Orem, RN, BSN, [place] at [place] [phone]                     Time Spent with Patient: 15 (08/07/15 0809)

## 2015-08-07 NOTE — Progress Notes (Signed)
Attempted to finish pt's mri scan with medication given, pt still moving all over the place due to either pain or claustrophobia or both.  RN gave max amount of meds allowed at this time but pt tried to climb out of the scanner, had to end exam with no progress and sent pt back to room.

## 2015-08-07 NOTE — Progress Notes (Addendum)
  Oncology Nurse Navigator Documentation   Navigator Encounter Type: Clinic/MDC (08/03/15 1130) Patient Visit Type: Medonc (08/03/15 1130)     To provide support and encouragement, care continuity and to assess for needs, met with patient during Est Pt appt with [name].  He was accompanied by his dtr and friend. He verbalized:  Understanding of Dr. Calton Dach discussion of yesterday's PET which indicated resolution of tongue ca, control of metastisis.  Desire to start chemo again in 4 wks vs taking a 3 month break or stopping tmt.  Understanding of Dr. Calton Dach pain mgt plan of methadone and dilaudid.  She increased methadone dosage. Per her guidance, I am referring patient to SLP Garald Balding.  Gayleen Orem, RN, BSN, [place] at [place] [phone]                      Time Spent with Patient: 30 (08/03/15 1130)

## 2015-08-08 ENCOUNTER — Inpatient Hospital Stay (HOSPITAL_COMMUNITY): Payer: PPO

## 2015-08-08 DIAGNOSIS — Z5181 Encounter for therapeutic drug level monitoring: Secondary | ICD-10-CM

## 2015-08-08 LAB — PROCALCITONIN: PROCALCITONIN: 0.87 ng/mL

## 2015-08-08 LAB — VANCOMYCIN, TROUGH: Vancomycin Tr: 23 ug/mL — ABNORMAL HIGH (ref 10.0–20.0)

## 2015-08-08 LAB — GLUCOSE, CAPILLARY: GLUCOSE-CAPILLARY: 130 mg/dL — AB (ref 65–99)

## 2015-08-08 MED ORDER — ONDANSETRON HCL 4 MG/2ML IJ SOLN
4.0000 mg | Freq: Four times a day (QID) | INTRAMUSCULAR | Status: DC | PRN
  Administered 2015-08-08 – 2015-08-10 (×3): 4 mg via INTRAVENOUS
  Filled 2015-08-08 (×3): qty 2

## 2015-08-08 MED ORDER — HYDROMORPHONE HCL 2 MG PO TABS
1.0000 mg | ORAL_TABLET | Freq: Four times a day (QID) | ORAL | Status: DC | PRN
  Administered 2015-08-10 (×2): 1 mg via ORAL
  Filled 2015-08-08 (×2): qty 1

## 2015-08-08 MED ORDER — GI COCKTAIL ~~LOC~~
30.0000 mL | Freq: Once | ORAL | Status: AC
  Administered 2015-08-08: 30 mL via ORAL
  Filled 2015-08-08: qty 30

## 2015-08-08 MED ORDER — METHADONE HCL 10 MG PO TABS
20.0000 mg | ORAL_TABLET | Freq: Three times a day (TID) | ORAL | Status: DC
  Administered 2015-08-08 – 2015-08-09 (×3): 20 mg via ORAL
  Filled 2015-08-08 (×3): qty 2

## 2015-08-08 MED ORDER — VANCOMYCIN HCL IN DEXTROSE 1-5 GM/200ML-% IV SOLN
1000.0000 mg | Freq: Two times a day (BID) | INTRAVENOUS | Status: DC
  Administered 2015-08-08 – 2015-08-12 (×8): 1000 mg via INTRAVENOUS
  Filled 2015-08-08 (×10): qty 200

## 2015-08-08 MED ORDER — MAGNESIUM SULFATE 2 GM/50ML IV SOLN
2.0000 g | Freq: Once | INTRAVENOUS | Status: AC
  Administered 2015-08-08: 2 g via INTRAVENOUS
  Filled 2015-08-08: qty 50

## 2015-08-08 MED ORDER — PANTOPRAZOLE SODIUM 40 MG PO TBEC
40.0000 mg | DELAYED_RELEASE_TABLET | Freq: Every day | ORAL | Status: DC
  Administered 2015-08-08 – 2015-08-12 (×5): 40 mg via ORAL
  Filled 2015-08-08 (×5): qty 1

## 2015-08-08 MED ORDER — POTASSIUM CHLORIDE CRYS ER 20 MEQ PO TBCR
40.0000 meq | EXTENDED_RELEASE_TABLET | Freq: Two times a day (BID) | ORAL | Status: AC
  Administered 2015-08-08 – 2015-08-10 (×3): 40 meq via ORAL
  Filled 2015-08-08 (×4): qty 2

## 2015-08-08 NOTE — Progress Notes (Addendum)
INFECTIOUS DISEASE PROGRESS NOTE  ID: Shane Cantu is a 59 y.o. male with  Principal Problem:   Sepsis Active Problems:   Tongue cancer   Chronic neck pain   Anemia due to antineoplastic chemotherapy   Chemotherapy-induced nausea   HCAP (healthcare-associated pneumonia)   Acute respiratory failure with hypoxia   Elevated troponin   Acute hyperglycemia   RBBB   OSA (obstructive sleep apnea)   Closed right ankle fracture   HTN (hypertension)   QT prolongation   Bacteremia   Metastatic cancer   Staphylococcus aureus bacteremia with sepsis   Infection due to portacath   Screen for STD (sexually transmitted disease)   MRSA bacteremia   Acute dyspnea   Severe sepsis with acute organ dysfunction   Acute respiratory failure with hypoxia and hypercarbia   OSA on CPAP   Chronic neck and back pain   Chronic diastolic congestive heart failure   Anemia due to chemotherapy   Hyperglycemia   Hypokalemia  Subjective: Without complaints.   Abtx:  Anti-infectives    Start     Dose/Rate Route Frequency Ordered Stop   08/08/15 2200  vancomycin (VANCOCIN) IVPB 1000 mg/200 mL premix     1,000 mg 200 mL/hr over 60 Minutes Intravenous Every 12 hours 08/08/15 0719     08/06/15 0630  vancomycin (VANCOCIN) 1,250 mg in sodium chloride 0.9 % 250 mL IVPB  Status:  Discontinued     1,250 mg 166.7 mL/hr over 90 Minutes Intravenous Every 12 hours 08/06/15 0627 08/08/15 0719   08/05/15 1400  ceFAZolin (ANCEF) IVPB 2 g/50 mL premix  Status:  Discontinued     2 g 100 mL/hr over 30 Minutes Intravenous 3 times per day 08/05/15 1044 08/06/15 0919   08/04/15 1800  vancomycin (VANCOCIN) IVPB 750 mg/150 ml premix  Status:  Discontinued     750 mg 150 mL/hr over 60 Minutes Intravenous Every 12 hours 08/04/15 0731 08/06/15 0627   08/04/15 1200  piperacillin-tazobactam (ZOSYN) IVPB 3.375 g  Status:  Discontinued     3.375 g 12.5 mL/hr over 240 Minutes Intravenous 3 times per day 08/04/15 0731 08/05/15  1037   08/04/15 0315  piperacillin-tazobactam (ZOSYN) IVPB 3.375 g     3.375 g 100 mL/hr over 30 Minutes Intravenous  Once 08/04/15 0300 08/04/15 0404   08/04/15 0315  vancomycin (VANCOCIN) IVPB 1000 mg/200 mL premix  Status:  Discontinued     1,000 mg 200 mL/hr over 60 Minutes Intravenous  Once 08/04/15 0300 08/04/15 0309   08/04/15 0315  vancomycin (VANCOCIN) 1,500 mg in sodium chloride 0.9 % 500 mL IVPB     1,500 mg 250 mL/hr over 120 Minutes Intravenous  Once 08/04/15 0309 08/04/15 0543      Medications:  Scheduled: . albuterol  2.5 mg Nebulization QID  . antiseptic oral rinse  7 mL Mouth Rinse QID  . aspirin EC  81 mg Oral Daily  . chlorhexidine gluconate  15 mL Mouth Rinse BID  . Chlorhexidine Gluconate Cloth  6 each Topical Q0600  . enoxaparin (LOVENOX) injection  50 mg Subcutaneous Q24H  . lactose free nutrition  2 Container Oral TID WC  . methadone  15 mg Oral Q8H  . modafinil  200 mg Oral Daily  . mupirocin ointment  1 application Nasal BID  . tobramycin  1 drop Left Eye 6 times per day  . vancomycin  1,000 mg Intravenous Q12H    Objective: Vital signs in last 24 hours: Temp:  [  97.7 F (36.5 C)-99.6 F (37.6 C)] 98.3 F (36.8 C) (09/07 1143) Pulse Rate:  [37-90] 88 (09/07 1143) Resp:  [12-26] 14 (09/07 1143) BP: (135-173)/(67-94) 135/90 mmHg (09/07 1143) SpO2:  [94 %-100 %] 100 % (09/07 1143)   General appearance: alert, cooperative and no distress Resp: clear to auscultation bilaterally Cardio: regular rate and rhythm GI: normal findings: bowel sounds normal and soft, non-tender  Lab Results  Recent Labs  08/06/15 0516 08/06/15 0519 08/07/15 0300  WBC 5.9  --  5.0  HGB 7.9*  --  8.0*  HCT 25.2*  --  25.3*  NA  --  138 139  K  --  3.1* 3.3*  CL  --  99* 103  CO2  --  30 27  BUN  --  <5* <5*  CREATININE  --  0.90 0.86   Liver Panel  Recent Labs  08/06/15 0519 08/07/15 0300  PROT 6.4* 6.1*  ALBUMIN 2.5* 2.6*  AST 34 32  ALT 16* 16*    ALKPHOS 72 74  BILITOT 0.5 0.3   Sedimentation Rate  Recent Labs  08/06/15 0519  ESRSEDRATE 122*   C-Reactive Protein  Recent Labs  08/06/15 0519  CRP 19.1*    Microbiology: Recent Results (from the past 240 hour(s))  Blood Culture (routine x 2)     Status: None   Collection Time: 08/04/15  3:27 AM  Result Value Ref Range Status   Specimen Description BLOOD LEFT FOREARM  Final   Special Requests BOTTLES DRAWN AEROBIC AND ANAEROBIC 10CC  Final   Culture  Setup Time   Final    AEROBIC BOTTLE ONLY GRAM POSITIVE COCCI IN CLUSTERS CRITICAL RESULT CALLED TO, READ BACK BY AND VERIFIED WITH: [place] 2221 08/04/15 M.CAMPBELL CONFIRMED BY R.GREENE    Culture METHICILLIN RESISTANT STAPHYLOCOCCUS AUREUS  Final   Report Status 08/06/2015 FINAL  Final   Organism ID, Bacteria METHICILLIN RESISTANT STAPHYLOCOCCUS AUREUS  Final      Susceptibility   Methicillin resistant staphylococcus aureus - MIC*    CIPROFLOXACIN >=8 RESISTANT Resistant     ERYTHROMYCIN >=8 RESISTANT Resistant     GENTAMICIN <=0.5 SENSITIVE Sensitive     OXACILLIN >=4 RESISTANT Resistant     TETRACYCLINE <=1 SENSITIVE Sensitive     VANCOMYCIN <=0.5 SENSITIVE Sensitive     TRIMETH/SULFA <=10 SENSITIVE Sensitive     CLINDAMYCIN <=0.25 SENSITIVE Sensitive     RIFAMPIN <=0.5 SENSITIVE Sensitive     Inducible Clindamycin NEGATIVE Sensitive     * METHICILLIN RESISTANT STAPHYLOCOCCUS AUREUS  Blood Culture (routine x 2)     Status: None (Preliminary result)   Collection Time: 08/04/15  3:27 AM  Result Value Ref Range Status   Specimen Description BLOOD NECK  Final   Special Requests BOTTLES DRAWN AEROBIC AND ANAEROBIC 5CC  Final   Culture NO GROWTH 3 DAYS  Final   Report Status PENDING  Incomplete  Urine culture     Status: None   Collection Time: 08/04/15  3:49 AM  Result Value Ref Range Status   Specimen Description URINE, CLEAN CATCH  Final   Special Requests NONE  Final   Culture 2,000 COLONIES/mL  INSIGNIFICANT GROWTH  Final   Report Status 08/05/2015 FINAL  Final  MRSA PCR Screening     Status: Abnormal   Collection Time: 08/04/15 10:02 AM  Result Value Ref Range Status   MRSA by PCR POSITIVE (A) NEGATIVE Final    Comment:        The  GeneXpert MRSA Assay (FDA approved for NASAL specimens only), is one component of a comprehensive MRSA colonization surveillance program. It is not intended to diagnose MRSA infection nor to guide or monitor treatment for MRSA infections. RESULT CALLED TO, READ BACK BY AND VERIFIED WITH: NOTIFIED M. LESSARD RN 08/04/15 AT 1139 BY A. DAVIS   Culture, blood (routine x 2)     Status: None (Preliminary result)   Collection Time: 08/05/15 11:45 AM  Result Value Ref Range Status   Specimen Description BLOOD RIGHT ANTECUBITAL  Final   Special Requests BOTTLES DRAWN AEROBIC AND ANAEROBIC 10CC  Final   Culture NO GROWTH 2 DAYS  Final   Report Status PENDING  Incomplete  Culture, blood (routine x 2)     Status: None (Preliminary result)   Collection Time: 08/05/15 12:00 PM  Result Value Ref Range Status   Specimen Description BLOOD BLOOD RIGHT HAND  Final   Special Requests BOTTLES DRAWN AEROBIC AND ANAEROBIC 10CC  Final   Culture NO GROWTH 2 DAYS  Final   Report Status PENDING  Incomplete    Studies/Results: Mr Cervical Spine Wo Contrast  08/08/2015   CLINICAL DATA:  Initial evaluation bacteremia.  EXAM: MRI CERVICAL SPINE WITHOUT CONTRAST  TECHNIQUE: Multiplanar, multisequence MR imaging of the cervical spine was performed. No intravenous contrast was administered.  COMPARISON:  Prior PET-CT from 08/02/2015.  FINDINGS: Study is limited by motion artifact and patient's inability to tolerate the full length of the exam.  Visualized portions of the brain demonstrate no acute abnormality. Craniocervical junction widely patent.  Mild straightening of the normal cervical lordosis. Vertebral body heights maintained.  There is a 9 mm T1 hypointense, stir  hyperintense lesion within the C2 vertebral body, concerning for osseous metastasis. Similarly, there is a proximally 11 mm T1 hypointense, stir hyperintense lesion within the T2 vertebral body, also concerning for osseous metastasis. Surrounding edema within the adjacent T2 vertebral body. No definite associated pathologic fracture. No extraosseous tumor. No other focal osseous lesions.  No definite evidence for osteomyelitis discitis on this examination.  Signal intensity within the cervical spinal cord is normal.  Minimal prevertebral edema. Remainder the paraspinous soft tissues within normal limits.  C2-C3: Bilateral uncovertebral spurring and facet arthrosis. No significant canal or foraminal stenosis.  C3-C4: Mild bilateral uncovertebral hypertrophy and facet arthrosis, slightly worse on the left. Resultant very mild bilateral foraminal narrowing. No significant canal stenosis.  C4-C5: Mild bilateral uncovertebral hypertrophy with predominately left-sided facet arthrosis. Resultant mild bilateral foraminal narrowing, slightly worse on the left. There is superimposed annular disc bulge indenting the ventral thecal sac with resultant minimal canal stenosis.  C5-C6: Broad-based diffuse degenerative disc osteophyte with bilateral uncovertebral spurring and facet arthrosis. There is resultant moderate bilateral foraminal narrowing, right worse than left. Posterior disc osteophyte flattens and largely effaces the ventral thecal sac and results in mild to moderate canal stenosis. Mild cord flattening without cord signal changes.  C6-C7: Degenerative disc bulge with bilateral uncovertebral spurring and intervertebral disc space narrowing. Mild bilateral facet arthrosis. There is resultant moderate bilateral foraminal narrowing, slightly worse on the right. Posterior disc bulge indents the ventral thecal sac and results in mild canal stenosis.  C7-T1: Mild disc bulge with bilateral uncovertebral spurring. Resultant  mild bilateral foraminal stenosis. No significant canal stenosis.  Remainder of the upper thoracic spine demonstrates no acute abnormality.  IMPRESSION: 1. Limited study due to motion artifact and patient's inability to tolerate the entirety of the exam. 2. No convincing evidence for  active infection within the cervical spine. There is mild prevertebral edema, which may be related to treatment of tongue base cancer. 3. Osseous metastases within the C2 and T2 vertebral bodies. 4. Multilevel degenerative spondylolysis as above, most severe at C5-6 and C6-7.   Electronically Signed   By: Jeannine Boga M.D.   On: 08/08/2015 06:55   Ir Removal Tun Access W/ Port W/o Fl Mod Sed  08/07/2015   CLINICAL DATA:  Port catheter placed by IR 05/09/2015.  Worked well.  Hx tongue cancer; need for chemotherapyPt noted fever; N/VAdmitted 08/04/15: sepsis: MRSA bacteremiaNow for PAC removal requested  EXAM: EXAM TUNNELED PORT CATHETER REMOVAL  TECHNIQUE: The procedure, risks (including but not limited to bleeding, infection, organ damage ), benefits, and alternatives were explained to the patient. Questions regarding the procedure were encouraged and answered. The patient understands and consents to the procedure.  Intravenous Fentanyl and Versed were administered as conscious sedation during continuous cardiorespiratory monitoring by the radiology RN, with a total moderate sedation time of 11 minutes.  Overlying skin prepped with chlorhexidine, draped in usual sterile fashion, infiltrated locally with 1% lidocaine. A small incision was made over the scar from previous placement. The port catheter was dissected free from the underlying soft tissues and removed intact. No purulence around the port pocket. Hemostasis was achieved. The port pocket was closed with deep interrupted and subcuticular continuous 3-0 Monocryl sutures, then covered with Dermabond. The patient tolerated the procedure well.  COMPLICATIONS: COMPLICATIONS  None immediate  IMPRESSION: 1.  Technically successful tunneled Port catheter removal.   Electronically Signed   By: Lucrezia Europe M.D.   On: 08/07/2015 16:13     Assessment/Plan: MRSA Bacteremia             Suspect related to his port, although his previous sputum Cx (8-22) also showed MRSA              Repeat BCx 9-4 are ngtd             Consider TEE if he can tolerate. IE not mentioned on his TTE             Length of therapy to be determined TEE if he can tolerate. Otherwise aim for 1 month vanco  Porta-Cath Infection             Place temporary IV until BCx clear  Port removed 9-6, appreciate IR eval.  Low back, neck pain                MRIs show C2 and T2 mets. No clear evidence of infection  Multifocal pneumonia             Improved, some residual in RML  Anemia             Continue to monitor  Hypokalemia  Need repeat K  Tongue Ca (stage IV)             Last CTX 8-12 (5FU, Carboplatinum, Cetuximab)  C2, T2 mets  Therpaeutic drug monitoring  Will continue to follow vanco levels, Cr.   Will ask Pharm to adjust dose due to increased level.   Total days of antibiotics: 4 vanco          Bobby Rumpf Infectious Diseases (pager) [phone] [email or web address] 08/08/2015, 11:56 AM  LOS: 4 days

## 2015-08-08 NOTE — Progress Notes (Signed)
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 He was seen by his Oncologist on 9/2 with no significant issues other than pain management, and he was told a recent PET noted improvement in his cancer. He presented to the ER after awakening during the night with shortness of breath and chest pressure.   In the ED his temperature was 101.6, his heart rate was 131 bpm sinus tachycardia, room air saturations 66%. Repeat temperature was 102F rectal.  Chest x-ray revealed a right basilar airspace opacity concerning for pneumonia. CT angiogram of the chest was without findings of pulmonary embolism but did suggest pneumonia.   HPI/Subjective: The patient is resting comfortably in bed.  He has no points the present time.  He has however experienced severe difficulty laying still for the MRI due to severe low back pain.  He denies chest pain shortness breath fevers or chills at the present time.  Assessment/Plan:  Severe sepsis due to MRSA bacteremia w/ multifocal PNA Continue current antibiotic regimen as suggested by ID - full endocarditis evaluation underway - MRI to be attempted again 9/8 with conscious sedation per anesthesia - to consider possible TEE eventually - the patient's Port-A-Cath was discontinued 9/6 - clinically appears to be stabilizing  Acute hypoxic respiratory failure due to HCAP - acute hypercarbic respiratory failure due to profoundly severe sleep apnea Utilize CPAP or  BiPAP at all times when asleep - monitor closely with supplemental oxygen - patient was observed to be barely breathing 9/4 afternoon while napping and was nearly intubated before spontaneously and nearly miraculously waking up in no distress - respiratory status appears stable at present time  Chronic neck and low back pain This may simply represent his chronic pain but it could also be related to staph aureus discitis, spinal osteomyelitis, or even epidural abscess - neurologically he is intact at present - MRI reordered for tomorrow with conscious sedation per anesthesia   Elevated troponin Likely simply stress induced - appears to have peaked at 0.38 - no chest pain  Tongue cancer As per Oncology - was last seen in the office 08/03/15  Anemia due to chemotherapy Follow hemoglobin trend - keep hemoglobin 7.0 or greater - appears to have stabilized at ~7.9  Hyperglycemia A1c 6.9, meeting diagnostic criteria for diabetes - CBGs are currently controlled - avoid medical treatment at present and will not yet limit diet given poor intake related to above acute issues  Hypomagnesemia Continue to replace and follow  Hypokalemia Continue to replace and follow   Severe obstructive sleep apnea utilize CPAP or BiPAP at all times when patient is sleeping and at bedtime   Code Status: FULL Family Communication: No family present at bedside today Disposition Plan: SDU due to potential to decompensate from respiratory standpoint with conscious sedation 9/8  Consultants: ID PCCM  Procedures: 9/4 LE dopplers - no DVT  Antibiotics: Cefazolin 9/4  Vancomycin 9/3 >  DVT prophylaxis: Lovenox  Objective: Blood pressure 135/90, pulse 88, temperature 98.3 F (36.8 C), temperature source Oral, resp. rate 14, height 5\' 9"  (1.753  m), weight 104.8 kg (231 lb 0.7 oz), SpO2 100 %.  Intake/Output Summary (Last 24 hours) at 08/08/15 1157 Last data filed at 08/08/15 1000  Gross per 24 hour  Intake    2110 ml  Output    950 ml  Net   1160 ml   Exam: General: No respiratory distress  Lungs: Clear to auscultation bilaterally without wheeze - distant breath sounds throughout all fields Cardiovascular: Regular rate and rhythm without murmur appreciable Abdomen: Nontender, protuberant, soft, bowel sounds positive, no rebound, no ascites, no appreciable mass Extremities: No significant cyanosis, clubbing, edema bilateral lower extremities  Data Reviewed: Basic Metabolic Panel:  Recent Labs Lab 08/03/15 1034 08/04/15 0327 08/04/15 0740 08/05/15 0643 08/06/15 0519 08/07/15 0300  NA 139 135 135  --  138 139  K 4.5 4.1 4.3  --  3.1* 3.3*  CL  --  97* 104  --  99* 103  CO2 29 29 28   --  30 27  GLUCOSE 173* 201* 167*  --  104* 111*  BUN 10.4 13 13   --  <5* <5*  CREATININE 1.2 1.30* 1.21  --  0.90 0.86  CALCIUM 9.3 8.7* 7.7*  --  8.5* 8.8*  MG 1.6  --   --  1.4*  --  1.3*    CBC:  Recent Labs Lab 08/03/15 1034 08/04/15 0327 08/04/15 0740 08/04/15 1821 08/06/15 0516 08/07/15 0300  WBC 8.7 9.5 8.8 7.3 5.9 5.0  NEUTROABS 7.9* 8.9*  --   --   --   --   HGB 9.9* 9.5* 7.8* 7.6* 7.9* 8.0*  HCT 31.2* 29.4* 25.1* 23.9* 25.2* 25.3*  MCV 92.6 93.6 93.7 93.7 92.6 91.3  PLT 161 171 141* 165 264 335    Liver Function Tests:  Recent Labs Lab 08/03/15 1034 08/04/15 0327 08/06/15 0519 08/07/15 0300  AST 17 21 34 32  ALT 15 14* 16* 16*  ALKPHOS 110 90 72 74  BILITOT 0.40 0.6 0.5 0.3  PROT 7.1 6.5 6.4* 6.1*  ALBUMIN 3.2* 3.1* 2.5* 2.6*   Cardiac Enzymes:  Recent Labs Lab 08/04/15 0336 08/04/15 1257 08/04/15 1821 08/04/15 2333  TROPONINI 0.20* 0.36* 0.34* 0.28*    CBG:  Recent Labs Lab 08/06/15 1703 08/06/15 2146 08/07/15 0801 08/07/15 1313 08/07/15 1654  GLUCAP 117* 123* 144* 130* 127*    Recent Results (from the past 240 hour(s))  Blood Culture (routine x 2)     Status: None   Collection Time: 08/04/15  3:27 AM  Result Value Ref Range Status    Specimen Description BLOOD LEFT FOREARM  Final   Special Requests BOTTLES DRAWN AEROBIC AND ANAEROBIC 10CC  Final   Culture  Setup Time   Final    AEROBIC BOTTLE ONLY GRAM POSITIVE COCCI IN CLUSTERS CRITICAL RESULT CALLED TO, READ BACK BY AND VERIFIED WITH: [place] 2221 08/04/15 M.CAMPBELL CONFIRMED BY R.GREENE    Culture METHICILLIN RESISTANT STAPHYLOCOCCUS AUREUS  Final   Report Status 08/06/2015 FINAL  Final   Organism ID, Bacteria METHICILLIN RESISTANT STAPHYLOCOCCUS AUREUS  Final      Susceptibility   Methicillin resistant staphylococcus aureus - MIC*    CIPROFLOXACIN >=8 RESISTANT Resistant     ERYTHROMYCIN >=8 RESISTANT Resistant     GENTAMICIN <=0.5 SENSITIVE Sensitive     OXACILLIN >=4 RESISTANT Resistant     TETRACYCLINE <=1 SENSITIVE Sensitive     VANCOMYCIN <=0.5 SENSITIVE Sensitive     TRIMETH/SULFA <=10 SENSITIVE Sensitive     CLINDAMYCIN <=0.25 SENSITIVE Sensitive  RIFAMPIN <=0.5 SENSITIVE Sensitive     Inducible Clindamycin NEGATIVE Sensitive     * METHICILLIN RESISTANT STAPHYLOCOCCUS AUREUS  Blood Culture (routine x 2)     Status: None (Preliminary result)   Collection Time: 08/04/15  3:27 AM  Result Value Ref Range Status   Specimen Description BLOOD NECK  Final   Special Requests BOTTLES DRAWN AEROBIC AND ANAEROBIC 5CC  Final   Culture NO GROWTH 3 DAYS  Final   Report Status PENDING  Incomplete  Urine culture     Status: None   Collection Time: 08/04/15  3:49 AM  Result Value Ref Range Status   Specimen Description URINE, CLEAN CATCH  Final   Special Requests NONE  Final   Culture 2,000 COLONIES/mL INSIGNIFICANT GROWTH  Final   Report Status 08/05/2015 FINAL  Final  MRSA PCR Screening     Status: Abnormal   Collection Time: 08/04/15 10:02 AM  Result Value Ref Range Status   MRSA by PCR POSITIVE (A) NEGATIVE Final    Comment:        The GeneXpert MRSA Assay (FDA approved for NASAL specimens only), is one component of a comprehensive MRSA  colonization surveillance program. It is not intended to diagnose MRSA infection nor to guide or monitor treatment for MRSA infections. RESULT CALLED TO, READ BACK BY AND VERIFIED WITH: NOTIFIED M. LESSARD RN 08/04/15 AT 1139 BY A. DAVIS   Culture, blood (routine x 2)     Status: None (Preliminary result)   Collection Time: 08/05/15 11:45 AM  Result Value Ref Range Status   Specimen Description BLOOD RIGHT ANTECUBITAL  Final   Special Requests BOTTLES DRAWN AEROBIC AND ANAEROBIC 10CC  Final   Culture NO GROWTH 2 DAYS  Final   Report Status PENDING  Incomplete  Culture, blood (routine x 2)     Status: None (Preliminary result)   Collection Time: 08/05/15 12:00 PM  Result Value Ref Range Status   Specimen Description BLOOD BLOOD RIGHT HAND  Final   Special Requests BOTTLES DRAWN AEROBIC AND ANAEROBIC 10CC  Final   Culture NO GROWTH 2 DAYS  Final   Report Status PENDING  Incomplete     Studies:   Recent x-ray studies have been reviewed in detail by the Attending Physician  Scheduled Meds:  Scheduled Meds: . albuterol  2.5 mg Nebulization QID  . antiseptic oral rinse  7 mL Mouth Rinse QID  . aspirin EC  81 mg Oral Daily  . chlorhexidine gluconate  15 mL Mouth Rinse BID  . Chlorhexidine Gluconate Cloth  6 each Topical Q0600  . enoxaparin (LOVENOX) injection  50 mg Subcutaneous Q24H  . lactose free nutrition  2 Container Oral TID WC  . methadone  15 mg Oral Q8H  . modafinil  200 mg Oral Daily  . mupirocin ointment  1 application Nasal BID  . tobramycin  1 drop Left Eye 6 times per day  . vancomycin  1,000 mg Intravenous Q12H    Time spent on care of this patient: 35 mins   Jasmyn Picha T , MD   [facility] Office  [phone] Pager - Text Page per Shea Evans as per below:  On-Call/Text Page:      [email or web address]      password [email or web address]  If 7PM-7AM, please contact night-coverage [email or web address] Password [email or web address] 08/08/2015, 11:57 AM   LOS: 4 days

## 2015-08-08 NOTE — Care Management Important Message (Signed)
Important Message  Patient Details  Name: Shane Cantu MRN: [id number] Date of Birth: 03-26-1956   Medicare Important Message Given:  [facility] notification given    Nathen May 08/08/2015, 12:00 [place] Message  Patient Details  Name: Shane Cantu MRN: [id number] Date of Birth: [birth year]   Medicare Important Message Given:  Yes-second notification given    Nathen May 08/08/2015, 12:00 PM

## 2015-08-08 NOTE — Progress Notes (Signed)
ANTIBIOTIC CONSULT NOTE - FOLLOW UP  Pharmacy Consult for Vancomycin Indication: MRSA bacteremia and PNA  Allergies  Allergen Reactions  . Morphine And Related Anaphylaxis    Patient Measurements: Height: 5\' 9"  (175.3 cm) Weight: 231 lb 0.7 oz (104.8 kg) IBW/kg (Calculated) : 70.7  Vital Signs: Temp: 98.4 F (36.9 C) (09/07 0342) Temp Source: Axillary (09/07 0342) BP: 150/78 mmHg (09/07 0342) Pulse Rate: 78 (09/07 0342) Intake/Output from previous day: 09/06 0701 - 09/07 0700 In: 1450 [I.V.:1200; IV Piggyback:250] Out: 1050 [Urine:1050] Intake/Output from this shift:    Labs:  Recent Labs  08/06/15 0516 08/06/15 0519 08/07/15 0300  WBC 5.9  --  5.0  HGB 7.9*  --  8.0*  PLT 264  --  335  CREATININE  --  0.90 0.86   Estimated Creatinine Clearance: 107.6 mL/min (by C-G formula based on Cr of 0.86).  Recent Labs  08/06/15 0516 08/08/15 0553  VANCOTROUGH 10 23*     Microbiology: Recent Results (from the past 720 hour(s))  Culture, expectorated sputum-assessment     Status: None   Collection Time: 07/23/15  6:38 AM  Result Value Ref Range Status   Specimen Description SPUTUM  Final   Special Requests NONE  Final   Sputum evaluation   Final    THIS SPECIMEN IS ACCEPTABLE. RESPIRATORY CULTURE REPORT TO FOLLOW.   Report Status 07/23/2015 FINAL  Final  Culture, respiratory (NON-Expectorated)     Status: None   Collection Time: 07/23/15  6:38 AM  Result Value Ref Range Status   Specimen Description SPUTUM  Final   Special Requests NONE  Final   Gram Stain   Final    NO WBC SEEN FEW SQUAMOUS EPITHELIAL CELLS PRESENT FEW GRAM POSITIVE COCCI IN CLUSTERS Performed at [facility]    Culture   Final    MODERATE METHICILLIN RESISTANT STAPHYLOCOCCUS AUREUS Note: RIFAMPIN AND GENTAMICIN SHOULD NOT BE USED AS SINGLE DRUGS FOR TREATMENT OF STAPH INFECTIONS. This organism DOES NOT demonstrate inducible Clindamycin resistance in vitro. Performed at [facility]    Report Status 07/26/2015 FINAL  Final   Organism ID, Bacteria METHICILLIN RESISTANT STAPHYLOCOCCUS AUREUS  Final      Susceptibility   Methicillin resistant staphylococcus aureus - MIC*    CLINDAMYCIN <=0.25 SENSITIVE Sensitive     ERYTHROMYCIN >=8 RESISTANT Resistant     GENTAMICIN <=0.5 SENSITIVE Sensitive     LEVOFLOXACIN 4 INTERMEDIATE Intermediate     OXACILLIN >=4 RESISTANT Resistant     RIFAMPIN <=0.5 SENSITIVE Sensitive     TRIMETH/SULFA <=10 SENSITIVE Sensitive     VANCOMYCIN 1 SENSITIVE Sensitive     TETRACYCLINE <=1 SENSITIVE Sensitive     * MODERATE METHICILLIN RESISTANT STAPHYLOCOCCUS AUREUS  Blood Culture (routine x 2)     Status: None   Collection Time: 08/04/15  3:27 AM  Result Value Ref Range Status   Specimen Description BLOOD LEFT FOREARM  Final   Special Requests BOTTLES DRAWN AEROBIC AND ANAEROBIC 10CC  Final   Culture  Setup Time   Final    AEROBIC BOTTLE ONLY GRAM POSITIVE COCCI IN CLUSTERS CRITICAL RESULT CALLED TO, READ BACK BY AND VERIFIED WITH: [place] 2221 08/04/15 M.CAMPBELL CONFIRMED BY R.GREENE    Culture METHICILLIN RESISTANT STAPHYLOCOCCUS AUREUS  Final   Report Status 08/06/2015 FINAL  Final   Organism ID, Bacteria METHICILLIN RESISTANT STAPHYLOCOCCUS AUREUS  Final      Susceptibility   Methicillin resistant staphylococcus aureus - MIC*    CIPROFLOXACIN >=  8 RESISTANT Resistant     ERYTHROMYCIN >=8 RESISTANT Resistant     GENTAMICIN <=0.5 SENSITIVE Sensitive     OXACILLIN >=4 RESISTANT Resistant     TETRACYCLINE <=1 SENSITIVE Sensitive     VANCOMYCIN <=0.5 SENSITIVE Sensitive     TRIMETH/SULFA <=10 SENSITIVE Sensitive     CLINDAMYCIN <=0.25 SENSITIVE Sensitive     RIFAMPIN <=0.5 SENSITIVE Sensitive     Inducible Clindamycin NEGATIVE Sensitive     * METHICILLIN RESISTANT STAPHYLOCOCCUS AUREUS  Blood Culture (routine x 2)     Status: None (Preliminary result)   Collection Time: 08/04/15  3:27 AM  Result Value Ref Range  Status   Specimen Description BLOOD NECK  Final   Special Requests BOTTLES DRAWN AEROBIC AND ANAEROBIC 5CC  Final   Culture NO GROWTH 3 DAYS  Final   Report Status PENDING  Incomplete  Urine culture     Status: None   Collection Time: 08/04/15  3:49 AM  Result Value Ref Range Status   Specimen Description URINE, CLEAN CATCH  Final   Special Requests NONE  Final   Culture 2,000 COLONIES/mL INSIGNIFICANT GROWTH  Final   Report Status 08/05/2015 FINAL  Final  MRSA PCR Screening     Status: Abnormal   Collection Time: 08/04/15 10:02 AM  Result Value Ref Range Status   MRSA by PCR POSITIVE (A) NEGATIVE Final    Comment:        The GeneXpert MRSA Assay (FDA approved for NASAL specimens only), is one component of a comprehensive MRSA colonization surveillance program. It is not intended to diagnose MRSA infection nor to guide or monitor treatment for MRSA infections. RESULT CALLED TO, READ BACK BY AND VERIFIED WITH: NOTIFIED M. LESSARD RN 08/04/15 AT 1139 BY A. DAVIS   Culture, blood (routine x 2)     Status: None (Preliminary result)   Collection Time: 08/05/15 11:45 AM  Result Value Ref Range Status   Specimen Description BLOOD RIGHT ANTECUBITAL  Final   Special Requests BOTTLES DRAWN AEROBIC AND ANAEROBIC 10CC  Final   Culture NO GROWTH 2 DAYS  Final   Report Status PENDING  Incomplete  Culture, blood (routine x 2)     Status: None (Preliminary result)   Collection Time: 08/05/15 12:00 PM  Result Value Ref Range Status   Specimen Description BLOOD BLOOD RIGHT HAND  Final   Special Requests BOTTLES DRAWN AEROBIC AND ANAEROBIC 10CC  Final   Culture NO GROWTH 2 DAYS  Final   Report Status PENDING  Incomplete    Anti-infectives    Start     Dose/Rate Route Frequency Ordered Stop   08/06/15 0630  vancomycin (VANCOCIN) 1,250 mg in sodium chloride 0.9 % 250 mL IVPB     1,250 mg 166.7 mL/hr over 90 Minutes Intravenous Every 12 hours 08/06/15 0627     08/05/15 1400  ceFAZolin  (ANCEF) IVPB 2 g/50 mL premix  Status:  Discontinued     2 g 100 mL/hr over 30 Minutes Intravenous 3 times per day 08/05/15 1044 08/06/15 0919   08/04/15 1800  vancomycin (VANCOCIN) IVPB 750 mg/150 ml premix  Status:  Discontinued     750 mg 150 mL/hr over 60 Minutes Intravenous Every 12 hours 08/04/15 0731 08/06/15 0627   08/04/15 1200  piperacillin-tazobactam (ZOSYN) IVPB 3.375 g  Status:  Discontinued     3.375 g 12.5 mL/hr over 240 Minutes Intravenous 3 times per day 08/04/15 0731 08/05/15 1037   08/04/15 0315  piperacillin-tazobactam (ZOSYN)  IVPB 3.375 g     3.375 g 100 mL/hr over 30 Minutes Intravenous  Once 08/04/15 0300 08/04/15 0404   08/04/15 0315  vancomycin (VANCOCIN) IVPB 1000 mg/200 mL premix  Status:  Discontinued     1,000 mg 200 mL/hr over 60 Minutes Intravenous  Once 08/04/15 0300 08/04/15 0309   08/04/15 0315  vancomycin (VANCOCIN) 1,500 mg in sodium chloride 0.9 % 500 mL IVPB     1,500 mg 250 mL/hr over 120 Minutes Intravenous  Once 08/04/15 0309 08/04/15 0543      Assessment: 89 YOM with hx stage IV tongue CA who presented to the [facility] on 9/3 with SOB and chest pressure. Of noting, the patient was recently admitted from 8/19 >> 8/23 with N/V, pain, and dehydration. During that admission, respiratory cultures from 8/22 resulted as MRSA on 8/25. Unfortunately, the patient was discharged on 8/23 prior to the result of the cultures and was sent out on Augmentin. 1/2 Blood cultures on this admit are now growing MRSA.  Now day #5 of vancomycin treatment. VT on 9/5 was subtherapeutic so dose was increased. VT this am was supratherapeutic at 23 and drawn appropriately. Afebrile, WBC wnl. SCr remains stable, normalized CrCl ~35ml/min   Goal of Therapy:  Vancomycin trough level 15-20 mcg/ml  Plan:  Decrease vancomycin to 1g IV Q12 starting tonight at 2200 Monitor clinical picture, renal function, VT prn F/U LOT   Maree Ainley J 08/08/2015,7:10 AM

## 2015-08-08 NOTE — Progress Notes (Signed)
Shane Cantu   DOB:1956-04-01   WU#:[id number]   GNF#:[id number]  I have seen the patient, examined him and edited the notes as follows  Subjective: Patient seen and examined. He is afebrile. He is feeling less weak. Denies  night sweats, vision changes, or mucositis. His shortness of breath is improved with the use of CPAP and respiratory therapy. Denies cough. Denies any chest pain or palpitations. Denies lower extremity swelling. Denies nausea, heartburn or change in bowel habits. Appetite is decreased. Denies any dysuria. Denies abnormal skin rashes, or neuropathy. Denies any tenderness ar the port removal site. He continues to have neck and back pain. Planned MRI of the spine had to be aborted due to uncontrolled pain. Denies any bleeding issues such as epistaxis, hematemesis, hematuria or hematochezia. Has been ambulating as tolerated.  Scheduled Meds: . albuterol  2.5 mg Nebulization QID  . antiseptic oral rinse  7 mL Mouth Rinse QID  . aspirin EC  81 mg Oral Daily  . chlorhexidine gluconate  15 mL Mouth Rinse BID  . Chlorhexidine Gluconate Cloth  6 each Topical Q0600  . enoxaparin (LOVENOX) injection  50 mg Subcutaneous Q24H  . lactose free nutrition  2 Container Oral TID WC  . methadone  15 mg Oral Q8H  . modafinil  200 mg Oral Daily  . mupirocin ointment  1 application Nasal BID  . tobramycin  1 drop Left Eye 6 times per day  . vancomycin  1,250 mg Intravenous Q12H   Continuous Infusions: . sodium chloride 50 mL/hr at 08/07/15 0632   PRN Meds:.lidocaine  Objective:  Filed Vitals:   08/08/15 0342  BP: 150/78  Pulse: 78  Temp: 98.4 F (36.9 C)  Resp: 15    Body mass index is 34.1 kg/(m^2).  Intake/Output Summary (Last 24 hours) at 08/08/15 0615 Last data filed at 08/08/15 0600  Gross per 24 hour  Intake   1700 ml  Output   1050 ml  Net    650 ml    GENERAL: alert, no distress and comfortable at this time. Chronically ill appearing SKIN: skin color is pale, texture,  turgor is normal, no rashes or significant lesions. Port removal site non tender. EYES: normal, conjunctiva are pink and non-injected, sclera clear OROPHARYNX: no exudate, no erythema and lips, buccal mucosa, and tongue normal.  NECK: supple, thyroid normal size, non-tender, without nodularity LYMPH: no palpable lymphadenopathy in the cervical, axillary or inguinal LUNGS: essentially clear to auscultation and percussion with normal breathing effort HEART: regular rate & rhythm and no murmurs and no lower extremity edema ABDOMEN: soft, non-tender and normal bowel sounds Musculoskeletal:no cyanosis of digits and no clubbing.  PSYCH: alert & oriented x 3 with fluent speech NEURO: no focal motor/sensory deficits   CBG (last 3)   Recent Labs  08/07/15 0801 08/07/15 1313 08/07/15 1654  GLUCAP 144* 130* 127*      Labs:   Recent Labs Lab 08/03/15 1034 08/04/15 0327 08/04/15 0740 08/04/15 1821 08/06/15 0516 08/07/15 0300  WBC 8.7 9.5 8.8 7.3 5.9 5.0  HGB 9.9* 9.5* 7.8* 7.6* 7.9* 8.0*  HCT 31.2* 29.4* 25.1* 23.9* 25.2* 25.3*  PLT 161 171 141* 165 264 335  MCV 92.6 93.6 93.7 93.7 92.6 91.3  MCH 29.4 30.3 29.1 29.8 29.0 28.9  MCHC 31.7* 32.3 31.1 31.8 31.3 31.6  RDW 17.4* 17.6* 17.7* 17.6* 17.5* 17.5*  LYMPHSABS 0.4* 0.3*  --   --   --   --   MONOABS 0.4 0.4  --   --   --   --  EOSABS 0.0 0.0  --   --   --   --   BASOSABS 0.0 0.0  --   --   --   --      Chemistries:    Recent Labs Lab 08/03/15 1034 08/04/15 0327 08/04/15 0740 08/05/15 0643 08/06/15 0519 08/07/15 0300  NA 139 135 135  --  138 139  K 4.5 4.1 4.3  --  3.1* 3.3*  CL  --  97* 104  --  99* 103  CO2 29 29 28   --  30 27  GLUCOSE 173* 201* 167*  --  104* 111*  BUN 10.4 13 13   --  <5* <5*  CREATININE 1.2 1.30* 1.21  --  0.90 0.86  CALCIUM 9.3 8.7* 7.7*  --  8.5* 8.8*  MG 1.6  --   --  1.4*  --  1.3*  AST 17 21  --   --  34 32  ALT 15 14*  --   --  16* 16*  ALKPHOS 110 90  --   --  72 74  BILITOT  0.40 0.6  --   --  0.5 0.3    GFR Estimated Creatinine Clearance: 107.6 mL/min (by C-G formula based on Cr of 0.86). Liver Function Tests:  Recent Labs Lab 08/03/15 1034 08/04/15 0327 08/06/15 0519 08/07/15 0300  AST 17 21 34 32  ALT 15 14* 16* 16*  ALKPHOS 110 90 72 74  BILITOT 0.40 0.6 0.5 0.3  PROT 7.1 6.5 6.4* 6.1*  ALBUMIN 3.2* 3.1* 2.5* 2.6*    Microbiology  Methicillin resistant staphylococcus aureus - MIC*    CIPROFLOXACIN >=8 RESISTANT Resistant     ERYTHROMYCIN >=8 RESISTANT Resistant     GENTAMICIN <=0.5 SENSITIVE Sensitive     OXACILLIN >=4 RESISTANT Resistant     TETRACYCLINE <=1 SENSITIVE Sensitive     VANCOMYCIN <=0.5 SENSITIVE Sensitive     TRIMETH/SULFA <=10 SENSITIVE Sensitive     CLINDAMYCIN <=0.25 SENSITIVE Sensitive     RIFAMPIN <=0.5 SENSITIVE Sensitive     Inducible Clindamycin NEGATIVE Sensitive     * METHICILLIN RESISTANT STAPHYLOCOCCUS AUREUS  Blood Culture (routine x 2)     Status: None (Preliminary result)    Studies:  Ir Removal [employer] W/o Fl Mod Sed  08/07/2015  IMPRESSION: 1.  Technically successful tunneled Port catheter removal.   Electronically Signed   By: Lucrezia Europe M.D.   On: 08/07/2015 16:13   Assessment / Plan:   Severe Sepsis due to MRSA Bacteremia Multifocal Healthcare Associated Pneumonia Acute hypoxic respiratory failure in the setting of pneumonia and severe sleep apnea CT angiogram was negative for pulmonary embolism, but remarkable for multifocal pneumonia.  Blood cultures returned positive for Staphylococcus aureus.  Sensitive to Gentamycin, Vanco, Tetramycin, Bactrim, Clinda and Rifampim  He was placed on IV fluids, IV antibiotics, antipyretics and respiratory therapy including BiPAP with good response. Infectious Diseases has been consulted on 9/4 for the management of MRSA bacteremia. Appreciate Dr. Algis Downs care IR removed his port a cath on 9/6.  MRI spine to be performed once clinically stable  and pain better controlled,  to rule out any potential Staph discitis, spinal osteomyelitis versus epidural process.  Appreciate primary team's involvement. We will follow.  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 We have discussion at the ENT tumor board this morning and review his PET CT scan.   overall, he had excellent response to treatment. The contralateral lymphadenopathy in the right side of the neck could be reactive in nature. We will follow with repeat imaging in the future. I will defer all treatment until his bacteremia resolved.   Chronic neck pain, controlled He has chronic neck back pain, poorly controlled with Fentanyl patch. Recently he was switched to methadone. Then he came in with unresponsive episode, question overdose. MRI spine was to be performed on 9/6 to rule out any potential Staph discitis, spinal osteomyelitis versus epidural process, but due to pain issues and agitation this had to be aborted.  Hopefully this study can be re-scheduled for today Recommend palliative care team consult for assistance in chronic pain management  Hypomagnesemia He has low magnesium likely related to recent side effects of treatment. He received IV fluids and IV magnesium replacement with good response Current Mg levels are pending Continue magnesium replacement therapy as per primary team  Hypokalemia Continue to replenish as needed  Anemia in neoplastic disease In the setting of recent treatment, infection, and dilution No bleeding issues are reported at this time Last Hb was 8.0, no new values are available for review. He may need transfusion if he continues to be symptomatic. Will continue to monitor.  DVT prophylaxis On  Lovenox  Malnutrition Consider Nutrition evaluation  Deconditioning Consider OT/PT involvement  Full Code Other medical issues as per admitting team.  I will sign off. Please call me if questions arise. He has appointment to see me next week. If he remained hospitalized, I will see him in the hospital.   Elease Hashimoto 08/08/2015  6:15 AM Medical Oncology [facility] Hematology [place], [place], MD 08/08/2015

## 2015-08-09 ENCOUNTER — Inpatient Hospital Stay (HOSPITAL_COMMUNITY): Payer: PPO

## 2015-08-09 ENCOUNTER — Inpatient Hospital Stay (HOSPITAL_COMMUNITY): Payer: PPO | Admitting: Anesthesiology

## 2015-08-09 ENCOUNTER — Encounter (HOSPITAL_COMMUNITY): Payer: Self-pay | Admitting: Anesthesiology

## 2015-08-09 ENCOUNTER — Encounter (HOSPITAL_COMMUNITY)
Admission: EM | Disposition: A | Discharge status: Home / Self Care | Payer: Self-pay | Source: Home / Self Care | Attending: Internal Medicine

## 2015-08-09 DIAGNOSIS — A419 Sepsis, unspecified organism: Secondary | ICD-10-CM | POA: Diagnosis present

## 2015-08-09 DIAGNOSIS — R6520 Severe sepsis without septic shock: Secondary | ICD-10-CM

## 2015-08-09 DIAGNOSIS — E119 Type 2 diabetes mellitus without complications: Secondary | ICD-10-CM | POA: Diagnosis present

## 2015-08-09 DIAGNOSIS — C7951 Secondary malignant neoplasm of bone: Secondary | ICD-10-CM | POA: Diagnosis present

## 2015-08-09 DIAGNOSIS — G4733 Obstructive sleep apnea (adult) (pediatric): Secondary | ICD-10-CM | POA: Diagnosis present

## 2015-08-09 DIAGNOSIS — T80212A Local infection due to central venous catheter, initial encounter: Secondary | ICD-10-CM

## 2015-08-09 HISTORY — PX: RADIOLOGY WITH ANESTHESIA: SHX6223

## 2015-08-09 LAB — BASIC METABOLIC PANEL
Anion gap: 10 (ref 5–15)
BUN: 5 mg/dL — AB (ref 6–20)
CHLORIDE: 103 mmol/L (ref 101–111)
CO2: 24 mmol/L (ref 22–32)
CREATININE: 0.84 mg/dL (ref 0.61–1.24)
Calcium: 9 mg/dL (ref 8.9–10.3)
Glucose, Bld: 117 mg/dL — ABNORMAL HIGH (ref 65–99)
Potassium: 4 mmol/L (ref 3.5–5.1)
SODIUM: 137 mmol/L (ref 135–145)

## 2015-08-09 LAB — CULTURE, BLOOD (ROUTINE X 2): Culture: NO GROWTH

## 2015-08-09 LAB — MAGNESIUM: MAGNESIUM: 1.8 mg/dL (ref 1.7–2.4)

## 2015-08-09 SURGERY — RADIOLOGY WITH ANESTHESIA
Anesthesia: Monitor Anesthesia Care

## 2015-08-09 MED ORDER — PROMETHAZINE HCL 25 MG/ML IJ SOLN
6.2500 mg | INTRAMUSCULAR | Status: AC | PRN
  Administered 2015-08-09 – 2015-08-11 (×2): 6.25 mg via INTRAVENOUS
  Filled 2015-08-09 (×2): qty 1

## 2015-08-09 MED ORDER — METHADONE HCL 10 MG PO TABS
15.0000 mg | ORAL_TABLET | Freq: Three times a day (TID) | ORAL | Status: DC
  Administered 2015-08-10 (×2): 15 mg via ORAL
  Filled 2015-08-09 (×2): qty 2

## 2015-08-09 MED ORDER — HYDROMORPHONE HCL 1 MG/ML IJ SOLN: 0.2500 mg | INTRAMUSCULAR | Status: DC | PRN

## 2015-08-09 MED ORDER — GADOBENATE DIMEGLUMINE 529 MG/ML IV SOLN
20.0000 mL | Freq: Once | INTRAVENOUS | Status: AC
  Administered 2015-08-09: 20 mL via INTRAVENOUS

## 2015-08-09 NOTE — Transfer of Care (Signed)
Immediate Anesthesia Transfer of Care Note  Patient: Shane Cantu  Procedure(s) Performed: Procedure(s): MRI LUMBAR WITH/WITHOUT CONTRAST, CERVICAL WITH CONTRAST  (RADIOLOGY WITH ANESTHESIA) (N/A)  Patient Location: PACU  Anesthesia Type:MAC  Level of Consciousness: awake, alert , oriented and patient cooperative  Airway & Oxygen Therapy: Patient Spontanous Breathing  Post-op Assessment: Report given to RN, Post -op Vital signs reviewed and stable and Patient moving all extremities  Post vital signs: Reviewed and stable  Last Vitals:  Filed Vitals:   08/09/15 1115  BP:   Pulse: 62  Temp: 36.8 C  Resp: 19    Complications: No apparent anesthesia complications

## 2015-08-09 NOTE — Progress Notes (Signed)
INFECTIOUS DISEASE PROGRESS NOTE  ID: Shane Cantu is a 59 y.o. male with  Principal Problem:   Sepsis Active Problems:   Tongue cancer   Chronic neck pain   Anemia due to antineoplastic chemotherapy   Chemotherapy-induced nausea   HCAP (healthcare-associated pneumonia)   Acute respiratory failure with hypoxia   Elevated troponin   Acute hyperglycemia   RBBB   OSA (obstructive sleep apnea)   Closed right ankle fracture   HTN (hypertension)   QT prolongation   Bacteremia   Metastatic cancer   Staphylococcus aureus bacteremia with sepsis   Infection due to portacath   Screen for STD (sexually transmitted disease)   MRSA bacteremia   Acute dyspnea   Severe sepsis with acute organ dysfunction   Acute respiratory failure with hypoxia and hypercarbia   OSA on CPAP   Chronic neck and back pain   Chronic diastolic congestive heart failure   Anemia due to chemotherapy   Hyperglycemia   Hypokalemia  Subjective: complains of back pain  Abtx:  Anti-infectives    Start     Dose/Rate Route Frequency Ordered Stop   08/08/15 2200  vancomycin (VANCOCIN) IVPB 1000 mg/200 mL premix     1,000 mg 200 mL/hr over 60 Minutes Intravenous Every 12 hours 08/08/15 0719     08/06/15 0630  vancomycin (VANCOCIN) 1,250 mg in sodium chloride 0.9 % 250 mL IVPB  Status:  Discontinued     1,250 mg 166.7 mL/hr over 90 Minutes Intravenous Every 12 hours 08/06/15 0627 08/08/15 0719   08/05/15 1400  ceFAZolin (ANCEF) IVPB 2 g/50 mL premix  Status:  Discontinued     2 g 100 mL/hr over 30 Minutes Intravenous 3 times per day 08/05/15 1044 08/06/15 0919   08/04/15 1800  vancomycin (VANCOCIN) IVPB 750 mg/150 ml premix  Status:  Discontinued     750 mg 150 mL/hr over 60 Minutes Intravenous Every 12 hours 08/04/15 0731 08/06/15 0627   08/04/15 1200  piperacillin-tazobactam (ZOSYN) IVPB 3.375 g  Status:  Discontinued     3.375 g 12.5 mL/hr over 240 Minutes Intravenous 3 times per day 08/04/15 0731 08/05/15  1037   08/04/15 0315  piperacillin-tazobactam (ZOSYN) IVPB 3.375 g     3.375 g 100 mL/hr over 30 Minutes Intravenous  Once 08/04/15 0300 08/04/15 0404   08/04/15 0315  vancomycin (VANCOCIN) IVPB 1000 mg/200 mL premix  Status:  Discontinued     1,000 mg 200 mL/hr over 60 Minutes Intravenous  Once 08/04/15 0300 08/04/15 0309   08/04/15 0315  vancomycin (VANCOCIN) 1,500 mg in sodium chloride 0.9 % 500 mL IVPB     1,500 mg 250 mL/hr over 120 Minutes Intravenous  Once 08/04/15 0309 08/04/15 0543      Medications:  Scheduled: . albuterol  2.5 mg Nebulization QID  . antiseptic oral rinse  7 mL Mouth Rinse QID  . aspirin EC  81 mg Oral Daily  . chlorhexidine gluconate  15 mL Mouth Rinse BID  . Chlorhexidine Gluconate Cloth  6 each Topical Q0600  . enoxaparin (LOVENOX) injection  50 mg Subcutaneous Q24H  . lactose free nutrition  2 Container Oral TID WC  . methadone  20 mg Oral Q8H  . modafinil  200 mg Oral Daily  . pantoprazole  40 mg Oral Q1200  . potassium chloride  40 mEq Oral BID  . tobramycin  1 drop Left Eye 6 times per day  . vancomycin  1,000 mg Intravenous Q12H  Objective: Vital signs in last 24 hours: Temp:  [97.7 F (36.5 C)-98.3 F (36.8 C)] 97.9 F (36.6 C) (09/08 1443) Pulse Rate:  [59-93] 65 (09/08 1533) Resp:  [9-21] 20 (09/08 1533) BP: (116-152)/(67-85) 132/81 mmHg (09/08 1533) SpO2:  [96 %-100 %] 100 % (09/08 1533) FiO2 (%):  [32 %] 32 % (09/08 1250) Weight:  [98.5 kg (217 lb 2.5 oz)] 98.5 kg (217 lb 2.5 oz) (09/08 0500)   General appearance: alert, cooperative and no distress Resp: clear to auscultation bilaterally Cardio: regular rate and rhythm GI: normal findings: bowel sounds normal and soft, non-tender  Lab Results  Recent Labs  08/07/15 0300 08/09/15 0251  WBC 5.0  --   HGB 8.0*  --   HCT 25.3*  --   NA 139 137  K 3.3* 4.0  CL 103 103  CO2 27 24  BUN <5* 5*  CREATININE 0.86 0.84   Liver Panel  Recent Labs  08/07/15 0300  PROT  6.1*  ALBUMIN 2.6*  AST 32  ALT 16*  ALKPHOS 74  BILITOT 0.3   Sedimentation Rate No results for input(s): ESRSEDRATE in the last 72 hours. C-Reactive Protein No results for input(s): CRP in the last 72 hours.  Microbiology: Recent Results (from the past 240 hour(s))  Blood Culture (routine x 2)     Status: None   Collection Time: 08/04/15  3:27 AM  Result Value Ref Range Status   Specimen Description BLOOD LEFT FOREARM  Final   Special Requests BOTTLES DRAWN AEROBIC AND ANAEROBIC 10CC  Final   Culture  Setup Time   Final    AEROBIC BOTTLE ONLY GRAM POSITIVE COCCI IN CLUSTERS CRITICAL RESULT CALLED TO, READ BACK BY AND VERIFIED WITH: [place] 2221 08/04/15 M.CAMPBELL CONFIRMED BY R.GREENE    Culture METHICILLIN RESISTANT STAPHYLOCOCCUS AUREUS  Final   Report Status 08/06/2015 FINAL  Final   Organism ID, Bacteria METHICILLIN RESISTANT STAPHYLOCOCCUS AUREUS  Final      Susceptibility   Methicillin resistant staphylococcus aureus - MIC*    CIPROFLOXACIN >=8 RESISTANT Resistant     ERYTHROMYCIN >=8 RESISTANT Resistant     GENTAMICIN <=0.5 SENSITIVE Sensitive     OXACILLIN >=4 RESISTANT Resistant     TETRACYCLINE <=1 SENSITIVE Sensitive     VANCOMYCIN <=0.5 SENSITIVE Sensitive     TRIMETH/SULFA <=10 SENSITIVE Sensitive     CLINDAMYCIN <=0.25 SENSITIVE Sensitive     RIFAMPIN <=0.5 SENSITIVE Sensitive     Inducible Clindamycin NEGATIVE Sensitive     * METHICILLIN RESISTANT STAPHYLOCOCCUS AUREUS  Blood Culture (routine x 2)     Status: None   Collection Time: 08/04/15  3:27 AM  Result Value Ref Range Status   Specimen Description BLOOD NECK  Final   Special Requests BOTTLES DRAWN AEROBIC AND ANAEROBIC 5CC  Final   Culture NO GROWTH 5 DAYS  Final   Report Status 08/09/2015 FINAL  Final  Urine culture     Status: None   Collection Time: 08/04/15  3:49 AM  Result Value Ref Range Status   Specimen Description URINE, CLEAN CATCH  Final   Special Requests NONE  Final    Culture 2,000 COLONIES/mL INSIGNIFICANT GROWTH  Final   Report Status 08/05/2015 FINAL  Final  MRSA PCR Screening     Status: Abnormal   Collection Time: 08/04/15 10:02 AM  Result Value Ref Range Status   MRSA by PCR POSITIVE (A) NEGATIVE Final    Comment:        The GeneXpert  MRSA Assay (FDA approved for NASAL specimens only), is one component of a comprehensive MRSA colonization surveillance program. It is not intended to diagnose MRSA infection nor to guide or monitor treatment for MRSA infections. RESULT CALLED TO, READ BACK BY AND VERIFIED WITH: NOTIFIED M. LESSARD RN 08/04/15 AT 1139 BY A. DAVIS   Culture, blood (routine x 2)     Status: None (Preliminary result)   Collection Time: 08/05/15 11:45 AM  Result Value Ref Range Status   Specimen Description BLOOD RIGHT ANTECUBITAL  Final   Special Requests BOTTLES DRAWN AEROBIC AND ANAEROBIC 10CC  Final   Culture NO GROWTH 4 DAYS  Final   Report Status PENDING  Incomplete  Culture, blood (routine x 2)     Status: None (Preliminary result)   Collection Time: 08/05/15 12:00 PM  Result Value Ref Range Status   Specimen Description BLOOD BLOOD RIGHT HAND  Final   Special Requests BOTTLES DRAWN AEROBIC AND ANAEROBIC 10CC  Final   Culture NO GROWTH 4 DAYS  Final   Report Status PENDING  Incomplete    Studies/Results: Mr Cervical Spine Wo Contrast  08/08/2015   CLINICAL DATA:  Initial evaluation bacteremia.  EXAM: MRI CERVICAL SPINE WITHOUT CONTRAST  TECHNIQUE: Multiplanar, multisequence MR imaging of the cervical spine was performed. No intravenous contrast was administered.  COMPARISON:  Prior PET-CT from 08/02/2015.  FINDINGS: Study is limited by motion artifact and patient's inability to tolerate the full length of the exam.  Visualized portions of the brain demonstrate no acute abnormality. Craniocervical junction widely patent.  Mild straightening of the normal cervical lordosis. Vertebral body heights maintained.  There is a 9 mm  T1 hypointense, stir hyperintense lesion within the C2 vertebral body, concerning for osseous metastasis. Similarly, there is a proximally 11 mm T1 hypointense, stir hyperintense lesion within the T2 vertebral body, also concerning for osseous metastasis. Surrounding edema within the adjacent T2 vertebral body. No definite associated pathologic fracture. No extraosseous tumor. No other focal osseous lesions.  No definite evidence for osteomyelitis discitis on this examination.  Signal intensity within the cervical spinal cord is normal.  Minimal prevertebral edema. Remainder the paraspinous soft tissues within normal limits.  C2-C3: Bilateral uncovertebral spurring and facet arthrosis. No significant canal or foraminal stenosis.  C3-C4: Mild bilateral uncovertebral hypertrophy and facet arthrosis, slightly worse on the left. Resultant very mild bilateral foraminal narrowing. No significant canal stenosis.  C4-C5: Mild bilateral uncovertebral hypertrophy with predominately left-sided facet arthrosis. Resultant mild bilateral foraminal narrowing, slightly worse on the left. There is superimposed annular disc bulge indenting the ventral thecal sac with resultant minimal canal stenosis.  C5-C6: Broad-based diffuse degenerative disc osteophyte with bilateral uncovertebral spurring and facet arthrosis. There is resultant moderate bilateral foraminal narrowing, right worse than left. Posterior disc osteophyte flattens and largely effaces the ventral thecal sac and results in mild to moderate canal stenosis. Mild cord flattening without cord signal changes.  C6-C7: Degenerative disc bulge with bilateral uncovertebral spurring and intervertebral disc space narrowing. Mild bilateral facet arthrosis. There is resultant moderate bilateral foraminal narrowing, slightly worse on the right. Posterior disc bulge indents the ventral thecal sac and results in mild canal stenosis.  C7-T1: Mild disc bulge with bilateral uncovertebral  spurring. Resultant mild bilateral foraminal stenosis. No significant canal stenosis.  Remainder of the upper thoracic spine demonstrates no acute abnormality.  IMPRESSION: 1. Limited study due to motion artifact and patient's inability to tolerate the entirety of the exam. 2. No convincing evidence for active  infection within the cervical spine. There is mild prevertebral edema, which may be related to treatment of tongue base cancer. 3. Osseous metastases within the C2 and T2 vertebral bodies. 4. Multilevel degenerative spondylolysis as above, most severe at C5-6 and C6-7.   Electronically Signed   By: Jeannine Boga M.D.   On: 08/08/2015 06:55   Mr Cervical Spine W Contrast  08/09/2015   CLINICAL DATA:  History of stage IV head and neck cancer. The patient is currently receiving chemotherapy. Severe low back pain. Question infectious process or metastatic disease. The patient has known osseous metastases in the thoracic spine. Subsequent encounter.  EXAM: MRI CERVICAL SPINE WITH CONTRAST  TECHNIQUE: Multiplanar and multiecho pulse sequences of the cervical spine, to include the craniocervical junction and cervicothoracic junction, were obtained according to standard protocol with intravenous contrast.  CONTRAST:  20 mL MULTIHANCE GADOBENATE DIMEGLUMINE 529 MG/ML IV SOLN  COMPARISON:  CT abdomen and pelvis 08/11/2007. PET CT scan 08/02/2015. CT chest 08/04/2015.  FINDINGS: Vertebral body height and alignment are maintained. There is abnormal decreased T1 and T2 signal in the posterior aspect of the T11 vertebral body on the right extending into the pedicle. This correlates with the treated/sclerotic metastatic deposits seen on the prior chest CT and PET CT. No other evidence of metastatic disease is identified. A small hemangioma in T12 is incidentally noted. The patient is status post L4-5 and L5-S1 fusion. Pedicle screws and stabilization bars remain in place at L4-5. Solid bridging bone is seen across  both disc interspaces. A defect in the posterior right ilium is most consistent with prior bone graft harvest site. The conus medullaris is normal in signal and position. No abscess is identified. There is no evidence of discitis or osteomyelitis. Imaged intra-abdominal contents demonstrate a small cyst in the right kidney but are otherwise unremarkable.  The T11-12 level is imaged in the sagittal plane only and negative.  T12-L1:  Negative.  L1-2:  Negative.  L2-3:  Negative.  L3-4: There is some facet degenerative change and a shallow disc bulge. The central spinal canal and foramina and left foramen are open. There is mild right foraminal narrowing.  L4-5: Status post laminectomy and fusion. The central canal and foramina are patent.  L5-S1: Status post laminectomy and fusion. The central canal and foramina are patent.  IMPRESSION: Focus of abnormal signal in the posterior at aspect of the T11 vertebral body on the right extending into the pedicle correlates with a sclerotic/treated metastasis as seen on prior studies. No other evidence of metastatic disease is identified.  Negative for abscess or discitis.  Status post L4-S1 fusion. The levels are solidly fused and the central canal and foramina are widely patent.   Electronically Signed   By: Inge Rise M.D.   On: 08/09/2015 11:32   Mr Lumbar Spine W Wo Contrast  08/09/2015   CLINICAL DATA:  MRSA bacteremia  EXAM: MRI CERVICAL SPINE WITHOUT AND WITH CONTRAST  TECHNIQUE: Multiplanar and multiecho pulse sequences of the cervical spine, to include the craniocervical junction and cervicothoracic junction, were obtained without and with intravenous contrast.  CONTRAST:  2mL MULTIHANCE GADOBENATE DIMEGLUMINE 529 MG/ML IV SOLN  COMPARISON:  08/07/2015  FINDINGS: Limited examination secondary to patient motion degrading image quality.  The cervical cord is normal in size and signal. Vertebral body heights are maintained. There is degenerative disc disease of  the cervical spine most severe at C5-6 and C6-7. The cervical spine is normal in lordotic alignment. No static  listhesis. There is a persistent 9 mm T1 hypo intense enhancing C2 vertebral body lesion most concerning for metastatic disease. There is a 11 mm T1 hypo intense enhancing T2 vertebral body lesion with surrounding marrow edema concerning for metastatic disease with a mild pathologic compression fracture. There is no intradiscal signal abnormality or enhancement. Cerebellar tonsils are normal in position.  There is prevertebral soft tissue edema with avid enhancement on post-contrast imaging extending from the level of C2 to the visualized upper thoracic spine.  C2-3: Bilateral uncovertebral degenerative changes and facet arthropathy. No foraminal or central canal stenosis.  C3-4: Mild bilateral uncovertebral degenerative changes and facet arthropathy, left worse than right. Mild bilateral foraminal stenosis. No central canal stenosis. No significant disc protrusion.  C4-5: Mild right uncovertebral degenerative changes and mild bilateral facet arthropathy with mild right foraminal stenosis. Mild bilateral foraminal stenosis. Mild broad-based disc bulge. Mild central canal narrowing.  C5-6: Mild broad-based disc osteophyte complex. Right uncovertebral degenerative change and facet arthropathy resulting in severe right foraminal stenosis. Left uncovertebral degenerative change and facet arthropathy resulting in moderate left foraminal stenosis. Mild central canal stenosis.  C6-7: Eccentric right broad-based disc bulge with a left paracentral disc protrusion abutting the ventral cervical spinal cord. Bilateral uncovertebral degenerative change facet arthropathy resulting in moderate bilateral foraminal stenosis. Mild central canal stenosis.  C7-T1: Mild broad-based disc bulge with bilateral uncovertebral degenerative changes and mild bilateral foraminal stenosis.  IMPRESSION: 1. No evidence of  discitis/osteomyelitis of the cervical spine. 2. Osseous lesions in the C2-T2 vertebral bodies most concerning for metastatic disease. There is mild anterior compression deformity of the T2 vertebral body with significant marrow edema surrounding the bone lesion suggesting a mild pathologic compression fracture. 3. There is prevertebral soft tissue edema with avid enhancement on post-contrast imaging extending from the level of C2 to the visualized upper thoracic spine. 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 4. Cervical spondylosis as described above.   Electronically Signed   By: Kathreen Devoid   On: 08/09/2015 11:53     Assessment/Plan: MRSA Bacteremia Suspect related to his port, although his previous sputum Cx (8-22) also showed MRSA  Repeat BCx 9-4 are ngtd Consider TEE if he can tolerate. IE not mentioned on his TTE Length of therapy to be determined TEE if he can tolerate. Otherwise aim for 1 month vanco  Porta-Cath Infection Place temporary IV until BCx clear Port removed 9-6, appreciate IR eval.  Low back, neck pain MRIs show C2 and T2 mets. No clear evidence of infection  Repeat MRI with sedation done 9-8, shows same- no abscess or discitis. Metastatic disease.   Multifocal pneumonia Improved, some residual in RML  Anemia Continue to monitor  Hypokalemia improved 9-8  Tongue Ca (stage IV) Last CTX 8-12 (5FU, Carboplatinum, Cetuximab) C2, T2 mets  Therpaeutic drug monitoring Will continue to follow vanco levels, Cr (stable).  Will follow up repeat level.   Total days of antibiotics: 4 vanco         Bobby Rumpf Infectious Diseases (pager) [phone] [email or web address] 08/09/2015, 3:48 PM  LOS: 5 days

## 2015-08-09 NOTE — Anesthesia Preprocedure Evaluation (Addendum)
Anesthesia Evaluation  Patient identified by MRN, date of birth, ID band Patient awake  General Assessment Comment:metastatic head and neck tumor  Reviewed: Allergy & Precautions, NPO status , Patient's Chart, lab work & pertinent test results  History of Anesthesia Complications Negative for: history of anesthetic complications  Airway Mallampati: II  TM Distance: >3 FB Neck ROM: Full    Dental no notable dental hx. (+) Dental Advisory Given   Pulmonary sleep apnea ,    Pulmonary exam normal        Cardiovascular hypertension, Pt. on medications Normal cardiovascular exam     Neuro/Psych  Headaches, negative psych ROS   GI/Hepatic Neg liver ROS, GERD  ,  Endo/Other  Morbid obesity  Renal/GU negative Renal ROS     Musculoskeletal  (+) Arthritis ,   Abdominal (+) + obese,   Peds  Hematology   Anesthesia Other Findings   Reproductive/Obstetrics                            Anesthesia Physical  Anesthesia Plan  ASA: III  Anesthesia Plan: MAC   Post-op Pain Management:    Induction: Intravenous  Airway Management Planned: Simple Face Mask  Additional Equipment:   Intra-op Plan:   Post-operative Plan:   Informed Consent: I have reviewed the patients History and Physical, chart, labs and discussed the procedure including the risks, benefits and alternatives for the proposed anesthesia with the patient or authorized representative who has indicated his/her understanding and acceptance.   Dental advisory given  Plan Discussed with: CRNA and Anesthesiologist  Anesthesia Plan Comments:        Anesthesia Quick Evaluation

## 2015-08-09 NOTE — Anesthesia Postprocedure Evaluation (Signed)
Anesthesia Post Note  Patient: Shane Cantu  Procedure(s) Performed: Procedure(s) (LRB): MRI LUMBAR WITH/WITHOUT CONTRAST, CERVICAL WITH CONTRAST  (RADIOLOGY WITH ANESTHESIA) (N/A)  Anesthesia type: MAC  Patient location: PACU  Post pain: Pain level controlled  Post assessment: Patient's Cardiovascular Status Stable  Last Vitals:  Filed Vitals:   08/09/15 1206  BP:   Pulse: 59  Temp: 36.6 C  Resp: 11    Post vital signs: Reviewed and stable  Level of consciousness: sedated  Complications: No apparent anesthesia complications

## 2015-08-09 NOTE — Progress Notes (Signed)
Patient did not want to wear CPAP tonight.  Patient aware if he changes his mind to ask RN to call Respiratory. RN aware.

## 2015-08-09 NOTE — Progress Notes (Signed)
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 He was seen by his Oncologist on 9/2 with no significant issues other than pain management, and he was told a recent PET noted improvement in his cancer. He presented to the ER after awakening during the night with shortness of breath and chest pressure.   In the ED his temperature was 101.6, his heart rate was 131 bpm sinus tachycardia, room air saturations 66%. Repeat temperature was 102F rectal. Chest x-ray revealed a right basilar airspace opacity concerning for pneumonia. CT angiogram of the chest was without findings of pulmonary embolism but did suggest pneumonia.   HPI/Subjective: 9/8  A/O 4, NAD, negative CP, negative SOB. Patient continues to have pain in the lower C-spine/upper T-spine area. Negative paresthesias in the upper extremity, negative loss of strength upper extremities. Spoke with patient and he stated Dr. Heath Cantu (oncology) started him on the Methadone. Patient has requested that we reduce his methadone dose   Assessment/Plan:  Severe sepsis due to MRSA bacteremia w/ multifocal PNA -Continue current antibiotic regimen as suggested by ID  - results of C-spine/T-spine MRI; metastatic cancer, see results below  - as noted per ID his Port-A-Cath has been replaced.  -ID would like TEE performed if possible  Acute hypoxic respiratory failure due to HCAP/Acute hypercarbic respiratory failure due to profoundly severe sleep  apnea -Resolved -Utilize CPAP or BiPAP at all times when asleep - monitor closely with supplemental oxygen -Decrease methadone to 15 mg TID monitor closely for over sedation  Chronic neck and low back pain -See severe sepsis  -Chronic pain vs  staph aureus discitis, vs spinal osteomyelitis,vs epidural abscess - neurologically he is intact at present  -Patient's MAR shows patient taking methadone 40 mg TID.  -Per patient Dr. Heath Cantu (oncology) started the methadone.  -Reduce methadone 15 mg TID.  Patient's pain appears to controlled and patient has asked for reduction in medication.    Elevated troponin -Likely simply stress induced - appears to have peaked at 4.40  Diastolic CHF -Transfuse for hemoglobin<8  Tongue cancer -Per  Dr Cantu Cantu case was presented at  ENT tumor board on 9/7. In addition communicated to Dr Cantu Cantu the MRI findings of the C-spine/T-spine, and she feels no place for neurosurgery, and that. Patient will need additional chemotherapy when stable.  Metastatic tongue cancer -See tongue cancer  Anemia due to chemotherapy -See diastolic CHF  Diabetes type 2 controlled -9/5 Hemoglobin A1c= 6.9   Hypomagnesemia -Magnesium goal > 2   Hypokalemia -Potassium goal> 4   Code Status: FULL Family Communication: no family present at time of exam Disposition Plan: SNF vs home health    Consultants: Dr Cantu Cantu (oncology) Dr.Jeffrey C Cantu (ID)   Procedure/Significant Events: 9/4 LE dopplers - no DVT 9/4 echocardiogram;- Left ventricle:  mild LVH.-LVEF= 50% to 55%. Hypokinesis of mid-apicalanteroseptal myocardium. -(grade 1 diastolic dysfunction).  9/8 MRI C-spine with contrast;- negative discitis/osteomyelitis of the cervical spine. - Osseous lesions in the C2-T2 vertebral bodies most concerning metastatic disease. There is -Anterior compression deformity (pathologic compression  fracture) of T2 vertebral body with significant marrow edema  surrounding the bone lesion  - Prevertebral soft tissue edema with avid enhancement on post-contrast imaging extending from the level of C2 to the visualized upper thoracic spine. 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 - multilevel disc bulging, the worst at C6-7 right broad-based disc bulge with a left paracentral disc protrusion abutting the ventral cervical spinal cord    Culture 9/3 blood left forearm positive MRSA 9/3 blood NGTD 9/3 urine insignificant growth 9/3 MRSA by PCR positive 9/4 blood right AC/HAND NGTD   Antibiotics: Cefazolin 9/4 > stopped 9/5 Vancomycin 9/3 >   DVT prophylaxis: Lovenox   Devices   LINES / TUBES:      Continuous Infusions: . sodium chloride 1,000 mL (08/08/15 1647)    Objective: VITAL SIGNS: Temp: 98 F (36.7 C) (09/08 1949) Temp Source: Oral (09/08 1949) BP: 124/59 mmHg (09/08 1745) Pulse Rate: 73 (09/08 1745) SPO2; FIO2:   Intake/Output Summary (Last 24 hours) at 08/09/15 2101 Last data filed at 08/09/15 1800  Gross per 24 hour  Intake   1650 ml  Output   1525 ml  Net    125 ml     Exam: General: A/O 4, complains of chronic pain neck and back but tolerable, No acute respiratory distress Eyes: Negative headache, eye pain, scleral hemorrhage ENT: Negative Runny nose, negative ear pain,negative gingival bleeding, Neck:  Negative scars, masses, torticollis, lymphadenopathy, palpation C-spine tender midline, negative T-spine tenderness to palpation, JVD Lungs: Clear to auscultation bilaterally without wheezes or crackles Cardiovascular: Regular rate and rhythm without murmur gallop or rub normal S1 and S2 Abdomen:negative abdominal pain, negative dysphagia, nondistended, positive soft, bowel sounds, no rebound, no ascites, no appreciable mass Extremities: No significant cyanosis, clubbing, or edema bilateral lower extremities Psychiatric:   Negative depression, negative anxiety, negative fatigue, negative mania  Neurologic:  Cranial nerves II through XII intact, tongue/uvula midline, all extremities muscle strength 5/5, sensation intact throughout,within normal limits, negative dysarthria, negative expressive aphasia, negative receptive aphasia.   Data Reviewed: Basic Metabolic Panel:  Recent Labs Lab 08/03/15 1034 08/04/15 0327 08/04/15 0740 08/05/15 0643 08/06/15 0519 08/07/15 0300 08/09/15 0251  NA 139 135 135  --  138 139 137  K 4.5 4.1 4.3  --  3.1* 3.3* 4.0  CL  --  97* 104  --  99* 103 103  CO2 29 29 28   --  30 27 24   GLUCOSE 173* 201* 167*  --  104* 111* 117*  BUN 10.4 13 13   --  <5* <5* 5*  CREATININE 1.2 1.30* 1.21  --  0.90 0.86 0.84  CALCIUM 9.3 8.7* 7.7*  --  8.5* 8.8* 9.0  MG 1.6  --   --  1.4*  --  1.3* 1.8   Liver Function Tests:  Recent Labs Lab 08/03/15 1034 08/04/15 0327 08/06/15 0519 08/07/15 0300  AST 17 21 34 32  ALT 15 14* 16* 16*  ALKPHOS 110 90 72 74  BILITOT 0.40 0.6 0.5 0.3  PROT 7.1 6.5 6.4* 6.1*  ALBUMIN 3.2* 3.1* 2.5* 2.6*   No results for input(s): LIPASE, AMYLASE in the last 168 hours. No results for input(s): AMMONIA in the last 168 hours. CBC:  Recent Labs Lab 08/03/15 1034 08/04/15 0327 08/04/15 0740 08/04/15 1821 08/06/15 0516 08/07/15 0300  WBC 8.7 9.5 8.8 7.3 5.9 5.0  NEUTROABS 7.9* 8.9*  --   --   --   --  HGB 9.9* 9.5* 7.8* 7.6* 7.9* 8.0*  HCT 31.2* 29.4* 25.1* 23.9* 25.2* 25.3*  MCV 92.6 93.6 93.7 93.7 92.6 91.3  PLT 161 171 141* 165 264 335   Cardiac Enzymes:  Recent Labs Lab 08/04/15 0336 08/04/15 1257 08/04/15 1821 08/04/15 2333  TROPONINI 0.20* 0.36* 0.34* 0.28*   BNP (last 3 results) No results for input(s): BNP in the last 8760 hours.  ProBNP (last 3 results) No results for input(s): PROBNP in the last 8760 hours.  CBG:  Recent Labs Lab 08/06/15 2146 08/07/15 0801 08/07/15 1313 08/07/15 1654 08/08/15 1148  GLUCAP 123*  144* 130* 127* 130*    Recent Results (from the past 240 hour(s))  Blood Culture (routine x 2)     Status: None   Collection Time: 08/04/15  3:27 AM  Result Value Ref Range Status   Specimen Description BLOOD LEFT FOREARM  Final   Special Requests BOTTLES DRAWN AEROBIC AND ANAEROBIC 10CC  Final   Culture  Setup Time   Final    AEROBIC BOTTLE ONLY GRAM POSITIVE COCCI IN CLUSTERS CRITICAL RESULT CALLED TO, READ BACK BY AND VERIFIED WITH: [place] 2221 08/04/15 M.CAMPBELL CONFIRMED BY R.GREENE    Culture METHICILLIN RESISTANT STAPHYLOCOCCUS AUREUS  Final   Report Status 08/06/2015 FINAL  Final   Organism ID, Bacteria METHICILLIN RESISTANT STAPHYLOCOCCUS AUREUS  Final      Susceptibility   Methicillin resistant staphylococcus aureus - MIC*    CIPROFLOXACIN >=8 RESISTANT Resistant     ERYTHROMYCIN >=8 RESISTANT Resistant     GENTAMICIN <=0.5 SENSITIVE Sensitive     OXACILLIN >=4 RESISTANT Resistant     TETRACYCLINE <=1 SENSITIVE Sensitive     VANCOMYCIN <=0.5 SENSITIVE Sensitive     TRIMETH/SULFA <=10 SENSITIVE Sensitive     CLINDAMYCIN <=0.25 SENSITIVE Sensitive     RIFAMPIN <=0.5 SENSITIVE Sensitive     Inducible Clindamycin NEGATIVE Sensitive     * METHICILLIN RESISTANT STAPHYLOCOCCUS AUREUS  Blood Culture (routine x 2)     Status: None   Collection Time: 08/04/15  3:27 AM  Result Value Ref Range Status   Specimen Description BLOOD NECK  Final   Special Requests BOTTLES DRAWN AEROBIC AND ANAEROBIC 5CC  Final   Culture NO GROWTH 5 DAYS  Final   Report Status 08/09/2015 FINAL  Final  Urine culture     Status: None   Collection Time: 08/04/15  3:49 AM  Result Value Ref Range Status   Specimen Description URINE, CLEAN CATCH  Final   Special Requests NONE  Final   Culture 2,000 COLONIES/mL INSIGNIFICANT GROWTH  Final   Report Status 08/05/2015 FINAL  Final  MRSA PCR Screening     Status: Abnormal   Collection Time: 08/04/15 10:02 AM  Result Value Ref Range Status   MRSA  by PCR POSITIVE (A) NEGATIVE Final    Comment:        The GeneXpert MRSA Assay (FDA approved for NASAL specimens only), is one component of a comprehensive MRSA colonization surveillance program. It is not intended to diagnose MRSA infection nor to guide or monitor treatment for MRSA infections. RESULT CALLED TO, READ BACK BY AND VERIFIED WITH: NOTIFIED M. LESSARD RN 08/04/15 AT 1139 BY A. DAVIS   Culture, blood (routine x 2)     Status: None (Preliminary result)   Collection Time: 08/05/15 11:45 AM  Result Value Ref Range Status   Specimen Description BLOOD RIGHT ANTECUBITAL  Final   Special Requests BOTTLES DRAWN AEROBIC AND ANAEROBIC 10CC  Final   Culture NO GROWTH 4 DAYS  Final   Report Status PENDING  Incomplete  Culture, blood (routine x 2)     Status: None (Preliminary result)   Collection Time: 08/05/15 12:00 PM  Result Value Ref Range Status   Specimen Description BLOOD BLOOD RIGHT HAND  Final   Special Requests BOTTLES DRAWN AEROBIC AND ANAEROBIC 10CC  Final   Culture NO GROWTH 4 DAYS  Final   Report Status PENDING  Incomplete     Studies:  Recent x-ray studies have been reviewed in detail by the Attending Physician  Scheduled Meds:  Scheduled Meds: . albuterol  2.5 mg Nebulization QID  . antiseptic oral rinse  7 mL Mouth Rinse QID  . aspirin EC  81 mg Oral Daily  . chlorhexidine gluconate  15 mL Mouth Rinse BID  . Chlorhexidine Gluconate Cloth  6 each Topical Q0600  . enoxaparin (LOVENOX) injection  50 mg Subcutaneous Q24H  . lactose free nutrition  2 Container Oral TID WC  . [START ON 08/10/2015] methadone  15 mg Oral Q8H  . modafinil  200 mg Oral Daily  . pantoprazole  40 mg Oral Q1200  . potassium chloride  40 mEq Oral BID  . tobramycin  1 drop Left Eye 6 times per day  . vancomycin  1,000 mg Intravenous Q12H    Time spent on care of this patient: 40 mins   Dynesha Woolen, Geraldo Docker , MD  [facility] Office  [phone] Pager [phone]  On-Call/Text Page:      [email or web address]      password [email or web address]  If 7PM-7AM, please contact night-coverage [email or web address] Password [email or web address] 08/09/2015, 9:01 PM   LOS: 5 days   Care during the described time interval was provided by me .  I have reviewed this patient's available data, including medical history, events of note, physical examination, and all test results as part of my evaluation. I have personally reviewed and interpreted all radiology studies.   Dia Crawford, MD [phone] Pager

## 2015-08-10 ENCOUNTER — Encounter (HOSPITAL_COMMUNITY): Payer: Self-pay | Admitting: Radiology

## 2015-08-10 ENCOUNTER — Inpatient Hospital Stay (HOSPITAL_COMMUNITY): Payer: PPO

## 2015-08-10 DIAGNOSIS — T80219A Unspecified infection due to central venous catheter, initial encounter: Secondary | ICD-10-CM

## 2015-08-10 DIAGNOSIS — T80212D Local infection due to central venous catheter, subsequent encounter: Secondary | ICD-10-CM

## 2015-08-10 DIAGNOSIS — R7989 Other specified abnormal findings of blood chemistry: Secondary | ICD-10-CM

## 2015-08-10 DIAGNOSIS — A4101 Sepsis due to Methicillin susceptible Staphylococcus aureus: Secondary | ICD-10-CM

## 2015-08-10 DIAGNOSIS — M25531 Pain in right wrist: Secondary | ICD-10-CM | POA: Diagnosis present

## 2015-08-10 DIAGNOSIS — Y839 Surgical procedure, unspecified as the cause of abnormal reaction of the patient, or of later complication, without mention of misadventure at the time of the procedure: Secondary | ICD-10-CM

## 2015-08-10 LAB — COMPREHENSIVE METABOLIC PANEL
ALBUMIN: 2.9 g/dL — AB (ref 3.5–5.0)
ALT: 25 U/L (ref 17–63)
ANION GAP: 9 (ref 5–15)
AST: 30 U/L (ref 15–41)
Alkaline Phosphatase: 82 U/L (ref 38–126)
BUN: <5 mg/dL — ABNORMAL LOW (ref 6–20)
CHLORIDE: 103 mmol/L (ref 101–111)
CO2: 26 mmol/L (ref 22–32)
Calcium: 9.5 mg/dL (ref 8.9–10.3)
Creatinine, Ser: 0.89 mg/dL (ref 0.61–1.24)
GFR calc Af Amer: >60 mL/min (ref >60)
GFR calc non Af Amer: >60 mL/min (ref >60)
GLUCOSE: 103 mg/dL — AB (ref 65–99)
POTASSIUM: 4.3 mmol/L (ref 3.5–5.1)
SODIUM: 138 mmol/L (ref 135–145)
TOTAL PROTEIN: 7.5 g/dL (ref 6.5–8.1)
Total Bilirubin: 0.5 mg/dL (ref 0.3–1.2)

## 2015-08-10 LAB — CBC WITH DIFFERENTIAL/PLATELET
BASOS PCT: 0 % (ref 0–1)
Basophils Absolute: 0 10*3/uL (ref 0.0–0.1)
EOS ABS: 0.1 10*3/uL (ref 0.0–0.7)
Eosinophils Relative: 2 % (ref 0–5)
HCT: 30 % — ABNORMAL LOW (ref 39.0–52.0)
Hemoglobin: 9.7 g/dL — ABNORMAL LOW (ref 13.0–17.0)
Lymphocytes Relative: 15 % (ref 12–46)
Lymphs Abs: 0.7 10*3/uL (ref 0.7–4.0)
MCH: 29.9 pg (ref 26.0–34.0)
MCHC: 32.3 g/dL (ref 30.0–36.0)
MCV: 92.6 fL (ref 78.0–100.0)
MONO ABS: 0.9 10*3/uL (ref 0.1–1.0)
MONOS PCT: 20 % — AB (ref 3–12)
Neutro Abs: 3.1 10*3/uL (ref 1.7–7.7)
Neutrophils Relative %: 63 % (ref 43–77)
PLATELETS: 534 10*3/uL — AB (ref 150–400)
RBC: 3.24 MIL/uL — ABNORMAL LOW (ref 4.22–5.81)
RDW: 18.1 % — AB (ref 11.5–15.5)
WBC: 4.8 10*3/uL (ref 4.0–10.5)

## 2015-08-10 LAB — CULTURE, BLOOD (ROUTINE X 2)
CULTURE: NO GROWTH
Culture: NO GROWTH

## 2015-08-10 LAB — MAGNESIUM: Magnesium: 1.6 mg/dL — ABNORMAL LOW (ref 1.7–2.4)

## 2015-08-10 MED ORDER — ALBUTEROL SULFATE (2.5 MG/3ML) 0.083% IN NEBU
2.5000 mg | INHALATION_SOLUTION | Freq: Two times a day (BID) | RESPIRATORY_TRACT | Status: DC
  Administered 2015-08-11 – 2015-08-12 (×2): 2.5 mg via RESPIRATORY_TRACT
  Filled 2015-08-10 (×2): qty 3

## 2015-08-10 MED ORDER — METHADONE HCL 10 MG PO TABS
10.0000 mg | ORAL_TABLET | Freq: Three times a day (TID) | ORAL | Status: DC
  Administered 2015-08-10 – 2015-08-11 (×2): 10 mg via ORAL
  Filled 2015-08-10 (×2): qty 1

## 2015-08-10 MED ORDER — ALBUTEROL SULFATE (2.5 MG/3ML) 0.083% IN NEBU
2.5000 mg | INHALATION_SOLUTION | RESPIRATORY_TRACT | Status: DC | PRN
Start: 2015-08-10 — End: 2015-08-12

## 2015-08-10 MED ORDER — MAGNESIUM SULFATE 50 % IJ SOLN
3.0000 g | Freq: Once | INTRAVENOUS | Status: AC
  Administered 2015-08-10: 3 g via INTRAVENOUS
  Filled 2015-08-10 (×2): qty 6

## 2015-08-10 NOTE — Progress Notes (Signed)
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 He was seen by his Oncologist on 9/2 with no significant issues other than pain management, and he was told a recent PET noted improvement in his cancer. He presented to the ER after awakening during the night with shortness of breath and chest pressure.   In the ED his temperature was 101.6, his heart rate was 131 bpm sinus tachycardia, room air saturations 66%. Repeat temperature was 102F rectal. Chest x-ray revealed a right basilar airspace opacity concerning for pneumonia. CT angiogram of the chest was without findings of pulmonary embolism but did suggest pneumonia.   HPI/Subjective: 9/9  A/O 4, NAD, negative CP, negative SOB. Patient continues to have pain in the lower C-spine/upper T-spine area. Negative paresthesias in the upper extremity, negative loss of strength upper extremities. Patient finds a new pain in right wrist. Also would like [place] to discontinue his methadone secondary to filling that is the cause of his nausea.   Assessment/Plan:  Severe sepsis due to MRSA bacteremia w/ multifocal PNA -Continue current antibiotic regimen as suggested by ID  - results of C-spine/T-spine MRI; metastatic cancer, see results below  - as noted per ID his Port-A-Cath has been replaced.  -ID would like TEE performed if possible  Acute hypoxic respiratory failure due to HCAP/Acute hypercarbic respiratory failure due to profoundly severe sleep  apnea -Resolved -Utilize CPAP or BiPAP at all times when asleep - monitor closely with supplemental oxygen -Decrease methadone to 10 mg TID monitor closely for over sedation  Chronic neck and low back pain -See severe sepsis  -Chronic pain vs  staph aureus discitis, vs spinal osteomyelitis,vs epidural abscess - neurologically he is intact at present  -Patient's MAR shows patient taking methadone 40 mg TID.  -Per patient [name] (oncology) started the methadone.  -Reduce methadone 10 mg TID.  Patient's pain appears to controlled and patient has asked for reduction in medication.    Right wrist pain/swelling acute -X-ray right wrist pending  Elevated troponin -Likely simply stress induced - appears to have peaked at 7.37  Diastolic CHF -Transfuse for hemoglobin<8  Tongue cancer -Per  [name] case was presented at  ENT tumor board on 9/7. In addition communicated to [name] the MRI findings of the C-spine/T-spine, and she feels no place for neurosurgery, and that. Patient will need additional chemotherapy when stable.  Metastatic tongue cancer -See tongue cancer  Anemia due to chemotherapy -See diastolic CHF  Diabetes type 2 controlled -9/5 Hemoglobin A1c= 6.9   Hypomagnesemia -Magnesium goal > 2  -Magnesium IV 3 gm  Hypokalemia -Potassium goal> 4   Code Status: FULL Family Communication: no family present at time of exam Disposition Plan: SNF vs home health    Consultants: [name] (oncology) Dr.Jeffrey C Hatcher (ID)   Procedure/Significant Events: 9/4 LE dopplers - no DVT 9/4 echocardiogram;- Left ventricle:  mild LVH.-LVEF= 50% to 55%. Hypokinesis of mid-apicalanteroseptal myocardium. -(grade 1 diastolic dysfunction).  9/8 MRI C-spine with contrast;- negative discitis/osteomyelitis of the cervical spine. - Osseous lesions in  the C2-T2 vertebral bodies most concerning metastatic disease. There is -Anterior compression deformity  (pathologic compression fracture) of T2 vertebral body with significant marrow edema surrounding the bone lesion  - Prevertebral soft tissue edema with avid enhancement on post-contrast imaging extending from the level of C2 to the visualized upper thoracic spine. 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 - multilevel disc bulging, the worst at C6-7 right broad-based disc bulge with a left paracentral disc protrusion abutting the ventral cervical spinal cord    Culture 9/3 blood left forearm positive MRSA 9/3 blood neck negative  9/3 urine insignificant growth 9/3 MRSA by PCR positive 9/4 blood right AC/HAND NEGATIVE   Antibiotics: Cefazolin 9/4 > stopped 9/5 Vancomycin 9/3 >   DVT prophylaxis: Lovenox   Devices   LINES / TUBES:      Continuous Infusions: . sodium chloride 1,000 mL (08/08/15 1647)    Objective: VITAL SIGNS: Temp: 98.6 F (37 C) (09/09 1837) Temp Source: Oral (09/09 1837) BP: 153/86 mmHg (09/09 1837) Pulse Rate: 75 (09/09 1837) SPO2; FIO2:   Intake/Output Summary (Last 24 hours) at 08/10/15 1947 Last data filed at 08/10/15 1600  Gross per 24 hour  Intake   1900 ml  Output   1175 ml  Net    725 ml     Exam: General: A/O 4, complains of chronic pain neck and back but tolerable, No acute respiratory distress Eyes: Negative headache, eye pain, scleral hemorrhage ENT: Negative Runny nose, negative ear pain,negative gingival bleeding, Neck:  Negative scars, masses, torticollis, lymphadenopathy, palpation C-spine tender midline, negative T-spine tenderness to palpation, JVD Lungs: Clear to auscultation bilaterally without wheezes or crackles Cardiovascular: Regular rate and rhythm without murmur gallop or rub normal S1 and S2 Abdomen:negative abdominal pain, negative dysphagia, nondistended, positive soft, bowel sounds, no rebound, no ascites, no appreciable  mass Extremities: No significant cyanosis, clubbing, or edema bilateral lower extremities. Right wrist swollen, erythema, painful to palpation, painful to flex/ex Psychiatric:  Negative depression, negative anxiety, negative fatigue, negative mania  Neurologic:  Cranial nerves II through XII intact, tongue/uvula midline, all extremities muscle strength 5/5, sensation intact throughout,within normal limits, negative dysarthria, negative expressive aphasia, negative receptive aphasia.   Data Reviewed: Basic Metabolic Panel:  Recent Labs Lab 08/04/15 0740 08/05/15 0643 08/06/15 0519 08/07/15 0300 08/09/15 0251 08/10/15 0248  NA 135  --  138 139 137 138  K 4.3  --  3.1* 3.3* 4.0 4.3  CL 104  --  99* 103 103 103  CO2 28  --  30 27 24 26   GLUCOSE 167*  --  104* 111* 117* 103*  BUN 13  --  <5* <5* 5* <5*  CREATININE 1.21  --  0.90 0.86 0.84 0.89  CALCIUM 7.7*  --  8.5* 8.8* 9.0 9.5  MG  --  1.4*  --  1.3* 1.8 1.6*   Liver Function Tests:  Recent Labs Lab 08/04/15 0327 08/06/15 0519 08/07/15 0300 08/10/15 0248  AST 21 34 32 30  ALT 14* 16* 16* 25  ALKPHOS 90 72 74 82  BILITOT 0.6 0.5 0.3 0.5  PROT 6.5 6.4* 6.1* 7.5  ALBUMIN 3.1* 2.5* 2.6* 2.9*   No results for input(s): LIPASE, AMYLASE in the last 168 hours. No results for input(s): AMMONIA in the last 168 hours. CBC:  Recent Labs Lab 08/04/15 0327 08/04/15 0740 08/04/15 1821 08/06/15 0516 08/07/15 0300 08/10/15 0248  WBC 9.5 8.8 7.3 5.9 5.0  4.8  NEUTROABS 8.9*  --   --   --   --  3.1  HGB 9.5* 7.8* 7.6* 7.9* 8.0* 9.7*  HCT 29.4* 25.1* 23.9* 25.2* 25.3* 30.0*  MCV 93.6 93.7 93.7 92.6 91.3 92.6  PLT 171 141* 165 264 335 534*   Cardiac Enzymes:  Recent Labs Lab 08/04/15 0336 08/04/15 1257 08/04/15 1821 08/04/15 2333  TROPONINI 0.20* 0.36* 0.34* 0.28*   BNP (last 3 results) No results for input(s): BNP in the last 8760 hours.  ProBNP (last 3 results) No results for input(s): PROBNP in the last 8760  hours.  CBG:  Recent Labs Lab 08/06/15 2146 08/07/15 0801 08/07/15 1313 08/07/15 1654 08/08/15 1148  GLUCAP 123* 144* 130* 127* 130*    Recent Results (from the past 240 hour(s))  Blood Culture (routine x 2)     Status: None   Collection Time: 08/04/15  3:27 AM  Result Value Ref Range Status   Specimen Description BLOOD LEFT FOREARM  Final   Special Requests BOTTLES DRAWN AEROBIC AND ANAEROBIC 10CC  Final   Culture  Setup Time   Final    AEROBIC BOTTLE ONLY GRAM POSITIVE COCCI IN CLUSTERS CRITICAL RESULT CALLED TO, READ BACK BY AND VERIFIED WITH: [place] 2221 08/04/15 M.CAMPBELL CONFIRMED BY R.GREENE    Culture METHICILLIN RESISTANT STAPHYLOCOCCUS AUREUS  Final   Report Status 08/06/2015 FINAL  Final   Organism ID, Bacteria METHICILLIN RESISTANT STAPHYLOCOCCUS AUREUS  Final      Susceptibility   Methicillin resistant staphylococcus aureus - MIC*    CIPROFLOXACIN >=8 RESISTANT Resistant     ERYTHROMYCIN >=8 RESISTANT Resistant     GENTAMICIN <=0.5 SENSITIVE Sensitive     OXACILLIN >=4 RESISTANT Resistant     TETRACYCLINE <=1 SENSITIVE Sensitive     VANCOMYCIN <=0.5 SENSITIVE Sensitive     TRIMETH/SULFA <=10 SENSITIVE Sensitive     CLINDAMYCIN <=0.25 SENSITIVE Sensitive     RIFAMPIN <=0.5 SENSITIVE Sensitive     Inducible Clindamycin NEGATIVE Sensitive     * METHICILLIN RESISTANT STAPHYLOCOCCUS AUREUS  Blood Culture (routine x 2)     Status: None   Collection Time: 08/04/15  3:27 AM  Result Value Ref Range Status   Specimen Description BLOOD NECK  Final   Special Requests BOTTLES DRAWN AEROBIC AND ANAEROBIC 5CC  Final   Culture NO GROWTH 5 DAYS  Final   Report Status 08/09/2015 FINAL  Final  Urine culture     Status: None   Collection Time: 08/04/15  3:49 AM  Result Value Ref Range Status   Specimen Description URINE, CLEAN CATCH  Final   Special Requests NONE  Final   Culture 2,000 COLONIES/mL INSIGNIFICANT GROWTH  Final   Report Status 08/05/2015 FINAL   Final  MRSA PCR Screening     Status: Abnormal   Collection Time: 08/04/15 10:02 AM  Result Value Ref Range Status   MRSA by PCR POSITIVE (A) NEGATIVE Final    Comment:        The GeneXpert MRSA Assay (FDA approved for NASAL specimens only), is one component of a comprehensive MRSA colonization surveillance program. It is not intended to diagnose MRSA infection nor to guide or monitor treatment for MRSA infections. RESULT CALLED TO, READ BACK BY AND VERIFIED WITH: NOTIFIED M. LESSARD RN 08/04/15 AT 1139 BY A. DAVIS   Culture, blood (routine x 2)     Status: None   Collection Time: 08/05/15 11:45 AM  Result Value Ref Range Status   Specimen Description  BLOOD RIGHT ANTECUBITAL  Final   Special Requests BOTTLES DRAWN AEROBIC AND ANAEROBIC 10CC  Final   Culture NO GROWTH 5 DAYS  Final   Report Status 08/10/2015 FINAL  Final  Culture, blood (routine x 2)     Status: None   Collection Time: 08/05/15 12:00 PM  Result Value Ref Range Status   Specimen Description BLOOD BLOOD RIGHT HAND  Final   Special Requests BOTTLES DRAWN AEROBIC AND ANAEROBIC 10CC  Final   Culture NO GROWTH 5 DAYS  Final   Report Status 08/10/2015 FINAL  Final     Studies:  Recent x-ray studies have been reviewed in detail by the Attending Physician  Scheduled Meds:  Scheduled Meds: . albuterol  2.5 mg Nebulization QID  . antiseptic oral rinse  7 mL Mouth Rinse QID  . aspirin EC  81 mg Oral Daily  . chlorhexidine gluconate  15 mL Mouth Rinse BID  . enoxaparin (LOVENOX) injection  50 mg Subcutaneous Q24H  . lactose free nutrition  2 Container Oral TID WC  . magnesium sulfate 1 - 4 g bolus IVPB  3 g Intravenous Once  . methadone  10 mg Oral Q8H  . modafinil  200 mg Oral Daily  . pantoprazole  40 mg Oral Q1200  . potassium chloride  40 mEq Oral BID  . tobramycin  1 drop Left Eye 6 times per day  . vancomycin  1,000 mg Intravenous Q12H    Time spent on care of this patient: 40 mins   Shilpa Bushee, Geraldo Docker ,  MD  [facility] Office  [phone] Pager [phone]  On-Call/Text Page:      [email or web address]      password [email or web address]  If 7PM-7AM, please contact night-coverage [email or web address] Password [email or web address] 08/10/2015, 7:47 PM   LOS: 6 days   Care during the described time interval was provided by me .  I have reviewed this patient's available data, including medical history, events of note, physical examination, and all test results as part of my evaluation. I have personally reviewed and interpreted all radiology studies.   Dia Crawford, MD [phone] Pager

## 2015-08-10 NOTE — Care Management Important Message (Signed)
Important Message  Patient Details  Name: Shane Cantu MRN: [id number] Date of Birth: [birth year]   Medicare Important Message Given:  Yes-second notification given    Lacretia Leigh, RN 08/10/2015, 10:42 AM

## 2015-08-10 NOTE — Progress Notes (Signed)
INFECTIOUS DISEASE PROGRESS NOTE  ID: Shane Cantu is a 59 y.o. male with  Principal Problem:   Sepsis Active Problems:   Tongue cancer   Chronic neck pain   Anemia due to antineoplastic chemotherapy   Chemotherapy-induced nausea   HCAP (healthcare-associated pneumonia)   Acute respiratory failure with hypoxia   Elevated troponin   Acute hyperglycemia   RBBB   OSA (obstructive sleep apnea)   Closed right ankle fracture   HTN (hypertension)   QT prolongation   Bacteremia   Metastatic cancer   Staphylococcus aureus bacteremia with sepsis   Infection due to portacath   Screen for STD (sexually transmitted disease)   MRSA bacteremia   Acute dyspnea   Severe sepsis with acute organ dysfunction   Acute respiratory failure with hypoxia and hypercarbia   OSA on CPAP   Chronic neck and back pain   Chronic diastolic congestive heart failure   Anemia due to chemotherapy   Hyperglycemia   Hypokalemia   Severe sepsis   OSA treated with BiPAP   Metastatic cancer to bone   Diabetes type 2, controlled  Subjective: C/o R wrist pain  Abtx:  Anti-infectives    Start     Dose/Rate Route Frequency Ordered Stop   08/08/15 2200  vancomycin (VANCOCIN) IVPB 1000 mg/200 mL premix     1,000 mg 200 mL/hr over 60 Minutes Intravenous Every 12 hours 08/08/15 0719     08/06/15 0630  vancomycin (VANCOCIN) 1,250 mg in sodium chloride 0.9 % 250 mL IVPB  Status:  Discontinued     1,250 mg 166.7 mL/hr over 90 Minutes Intravenous Every 12 hours 08/06/15 0627 08/08/15 0719   08/05/15 1400  ceFAZolin (ANCEF) IVPB 2 g/50 mL premix  Status:  Discontinued     2 g 100 mL/hr over 30 Minutes Intravenous 3 times per day 08/05/15 1044 08/06/15 0919   08/04/15 1800  vancomycin (VANCOCIN) IVPB 750 mg/150 ml premix  Status:  Discontinued     750 mg 150 mL/hr over 60 Minutes Intravenous Every 12 hours 08/04/15 0731 08/06/15 0627   08/04/15 1200  piperacillin-tazobactam (ZOSYN) IVPB 3.375 g  Status:   Discontinued     3.375 g 12.5 mL/hr over 240 Minutes Intravenous 3 times per day 08/04/15 0731 08/05/15 1037   08/04/15 0315  piperacillin-tazobactam (ZOSYN) IVPB 3.375 g     3.375 g 100 mL/hr over 30 Minutes Intravenous  Once 08/04/15 0300 08/04/15 0404   08/04/15 0315  vancomycin (VANCOCIN) IVPB 1000 mg/200 mL premix  Status:  Discontinued     1,000 mg 200 mL/hr over 60 Minutes Intravenous  Once 08/04/15 0300 08/04/15 0309   08/04/15 0315  vancomycin (VANCOCIN) 1,500 mg in sodium chloride 0.9 % 500 mL IVPB     1,500 mg 250 mL/hr over 120 Minutes Intravenous  Once 08/04/15 0309 08/04/15 0543      Medications:  Scheduled: . albuterol  2.5 mg Nebulization QID  . antiseptic oral rinse  7 mL Mouth Rinse QID  . aspirin EC  81 mg Oral Daily  . chlorhexidine gluconate  15 mL Mouth Rinse BID  . enoxaparin (LOVENOX) injection  50 mg Subcutaneous Q24H  . lactose free nutrition  2 Container Oral TID WC  . methadone  15 mg Oral Q8H  . modafinil  200 mg Oral Daily  . pantoprazole  40 mg Oral Q1200  . potassium chloride  40 mEq Oral BID  . tobramycin  1 drop Left Eye 6 times per  day  . vancomycin  1,000 mg Intravenous Q12H    Objective: Vital signs in last 24 hours: Temp:  [97.9 F (36.6 C)-98.6 F (37 C)] 98.6 F (37 C) (09/09 0816) Pulse Rate:  [59-84] 83 (09/09 0816) Resp:  [10-26] 14 (09/09 0816) BP: (105-145)/(59-81) 105/73 mmHg (09/09 0700) SpO2:  [98 %-100 %] 99 % (09/09 0816) FiO2 (%):  [32 %] 32 % (09/08 1250)   General appearance: alert, cooperative and no distress Resp: clear to auscultation bilaterally Cardio: regular rate and rhythm GI: normal findings: bowel sounds normal and soft, non-tender Extremities: tenderness at R radial head, unable to flex wrist.   Lab Results  Recent Labs  08/09/15 0251 08/10/15 0248  WBC  --  4.8  HGB  --  9.7*  HCT  --  30.0*  NA 137 138  K 4.0 4.3  CL 103 103  CO2 24 26  BUN 5* <5*  CREATININE 0.84 0.89   Liver  Panel  Recent Labs  08/10/15 0248  PROT 7.5  ALBUMIN 2.9*  AST 30  ALT 25  ALKPHOS 82  BILITOT 0.5   Sedimentation Rate No results for input(s): ESRSEDRATE in the last 72 hours. C-Reactive Protein No results for input(s): CRP in the last 72 hours.  Microbiology: Recent Results (from the past 240 hour(s))  Blood Culture (routine x 2)     Status: None   Collection Time: 08/04/15  3:27 AM  Result Value Ref Range Status   Specimen Description BLOOD LEFT FOREARM  Final   Special Requests BOTTLES DRAWN AEROBIC AND ANAEROBIC 10CC  Final   Culture  Setup Time   Final    AEROBIC BOTTLE ONLY GRAM POSITIVE COCCI IN CLUSTERS CRITICAL RESULT CALLED TO, READ BACK BY AND VERIFIED WITH: [facility] 2221 08/04/15 M.CAMPBELL CONFIRMED BY R.GREENE    Culture METHICILLIN RESISTANT STAPHYLOCOCCUS AUREUS  Final   Report Status 08/06/2015 FINAL  Final   Organism ID, Bacteria METHICILLIN RESISTANT STAPHYLOCOCCUS AUREUS  Final      Susceptibility   Methicillin resistant staphylococcus aureus - MIC*    CIPROFLOXACIN >=8 RESISTANT Resistant     ERYTHROMYCIN >=8 RESISTANT Resistant     GENTAMICIN <=0.5 SENSITIVE Sensitive     OXACILLIN >=4 RESISTANT Resistant     TETRACYCLINE <=1 SENSITIVE Sensitive     VANCOMYCIN <=0.5 SENSITIVE Sensitive     TRIMETH/SULFA <=10 SENSITIVE Sensitive     CLINDAMYCIN <=0.25 SENSITIVE Sensitive     RIFAMPIN <=0.5 SENSITIVE Sensitive     Inducible Clindamycin NEGATIVE Sensitive     * METHICILLIN RESISTANT STAPHYLOCOCCUS AUREUS  Blood Culture (routine x 2)     Status: None   Collection Time: 08/04/15  3:27 AM  Result Value Ref Range Status   Specimen Description BLOOD NECK  Final   Special Requests BOTTLES DRAWN AEROBIC AND ANAEROBIC 5CC  Final   Culture NO GROWTH 5 DAYS  Final   Report Status 08/09/2015 FINAL  Final  Urine culture     Status: None   Collection Time: 08/04/15  3:49 AM  Result Value Ref Range Status   Specimen Description URINE, CLEAN CATCH   Final   Special Requests NONE  Final   Culture 2,000 COLONIES/mL INSIGNIFICANT GROWTH  Final   Report Status 08/05/2015 FINAL  Final  MRSA PCR Screening     Status: Abnormal   Collection Time: 08/04/15 10:02 AM  Result Value Ref Range Status   MRSA by PCR POSITIVE (A) NEGATIVE Final    Comment:  The GeneXpert MRSA Assay (FDA approved for NASAL specimens only), is one component of a comprehensive MRSA colonization surveillance program. It is not intended to diagnose MRSA infection nor to guide or monitor treatment for MRSA infections. RESULT CALLED TO, READ BACK BY AND VERIFIED WITH: NOTIFIED M. LESSARD RN 08/04/15 AT 1139 BY A. DAVIS   Culture, blood (routine x 2)     Status: None (Preliminary result)   Collection Time: 08/05/15 11:45 AM  Result Value Ref Range Status   Specimen Description BLOOD RIGHT ANTECUBITAL  Final   Special Requests BOTTLES DRAWN AEROBIC AND ANAEROBIC 10CC  Final   Culture NO GROWTH 4 DAYS  Final   Report Status PENDING  Incomplete  Culture, blood (routine x 2)     Status: None (Preliminary result)   Collection Time: 08/05/15 12:00 PM  Result Value Ref Range Status   Specimen Description BLOOD BLOOD RIGHT HAND  Final   Special Requests BOTTLES DRAWN AEROBIC AND ANAEROBIC 10CC  Final   Culture NO GROWTH 4 DAYS  Final   Report Status PENDING  Incomplete    Studies/Results: Mr Cervical Spine W Contrast  08/09/2015   CLINICAL DATA:  History of stage IV head and neck cancer. The patient is currently receiving chemotherapy. Severe low back pain. Question infectious process or metastatic disease. The patient has known osseous metastases in the thoracic spine. Subsequent encounter.  EXAM: MRI CERVICAL SPINE WITH CONTRAST  TECHNIQUE: Multiplanar and multiecho pulse sequences of the cervical spine, to include the craniocervical junction and cervicothoracic junction, were obtained according to standard protocol with intravenous contrast.  CONTRAST:  20 mL  MULTIHANCE GADOBENATE DIMEGLUMINE 529 MG/ML IV SOLN  COMPARISON:  CT abdomen and pelvis 08/11/2007. PET CT scan 08/02/2015. CT chest 08/04/2015.  FINDINGS: Vertebral body height and alignment are maintained. There is abnormal decreased T1 and T2 signal in the posterior aspect of the T11 vertebral body on the right extending into the pedicle. This correlates with the treated/sclerotic metastatic deposits seen on the prior chest CT and PET CT. No other evidence of metastatic disease is identified. A small hemangioma in T12 is incidentally noted. The patient is status post L4-5 and L5-S1 fusion. Pedicle screws and stabilization bars remain in place at L4-5. Solid bridging bone is seen across both disc interspaces. A defect in the posterior right ilium is most consistent with prior bone graft harvest site. The conus medullaris is normal in signal and position. No abscess is identified. There is no evidence of discitis or osteomyelitis. Imaged intra-abdominal contents demonstrate a small cyst in the right kidney but are otherwise unremarkable.  The T11-12 level is imaged in the sagittal plane only and negative.  T12-L1:  Negative.  L1-2:  Negative.  L2-3:  Negative.  L3-4: There is some facet degenerative change and a shallow disc bulge. The central spinal canal and foramina and left foramen are open. There is mild right foraminal narrowing.  L4-5: Status post laminectomy and fusion. The central canal and foramina are patent.  L5-S1: Status post laminectomy and fusion. The central canal and foramina are patent.  IMPRESSION: Focus of abnormal signal in the posterior at aspect of the T11 vertebral body on the right extending into the pedicle correlates with a sclerotic/treated metastasis as seen on prior studies. No other evidence of metastatic disease is identified.  Negative for abscess or discitis.  Status post L4-S1 fusion. The levels are solidly fused and the central canal and foramina are widely patent.    Electronically Signed  By: Inge Rise M.D.   On: 08/09/2015 11:32   Mr Lumbar Spine W Wo Contrast  08/09/2015   CLINICAL DATA:  MRSA bacteremia  EXAM: MRI CERVICAL SPINE WITHOUT AND WITH CONTRAST  TECHNIQUE: Multiplanar and multiecho pulse sequences of the cervical spine, to include the craniocervical junction and cervicothoracic junction, were obtained without and with intravenous contrast.  CONTRAST:  8mL MULTIHANCE GADOBENATE DIMEGLUMINE 529 MG/ML IV SOLN  COMPARISON:  08/07/2015  FINDINGS: Limited examination secondary to patient motion degrading image quality.  The cervical cord is normal in size and signal. Vertebral body heights are maintained. There is degenerative disc disease of the cervical spine most severe at C5-6 and C6-7. The cervical spine is normal in lordotic alignment. No static listhesis. There is a persistent 9 mm T1 hypo intense enhancing C2 vertebral body lesion most concerning for metastatic disease. There is a 11 mm T1 hypo intense enhancing T2 vertebral body lesion with surrounding marrow edema concerning for metastatic disease with a mild pathologic compression fracture. There is no intradiscal signal abnormality or enhancement. Cerebellar tonsils are normal in position.  There is prevertebral soft tissue edema with avid enhancement on post-contrast imaging extending from the level of C2 to the visualized upper thoracic spine.  C2-3: Bilateral uncovertebral degenerative changes and facet arthropathy. No foraminal or central canal stenosis.  C3-4: Mild bilateral uncovertebral degenerative changes and facet arthropathy, left worse than right. Mild bilateral foraminal stenosis. No central canal stenosis. No significant disc protrusion.  C4-5: Mild right uncovertebral degenerative changes and mild bilateral facet arthropathy with mild right foraminal stenosis. Mild bilateral foraminal stenosis. Mild broad-based disc bulge. Mild central canal narrowing.  C5-6: Mild broad-based disc  osteophyte complex. Right uncovertebral degenerative change and facet arthropathy resulting in severe right foraminal stenosis. Left uncovertebral degenerative change and facet arthropathy resulting in moderate left foraminal stenosis. Mild central canal stenosis.  C6-7: Eccentric right broad-based disc bulge with a left paracentral disc protrusion abutting the ventral cervical spinal cord. Bilateral uncovertebral degenerative change facet arthropathy resulting in moderate bilateral foraminal stenosis. Mild central canal stenosis.  C7-T1: Mild broad-based disc bulge with bilateral uncovertebral degenerative changes and mild bilateral foraminal stenosis.  IMPRESSION: 1. No evidence of discitis/osteomyelitis of the cervical spine. 2. Osseous lesions in the C2-T2 vertebral bodies most concerning for metastatic disease. There is mild anterior compression deformity of the T2 vertebral body with significant marrow edema surrounding the bone lesion suggesting a mild pathologic compression fracture. 3. There is prevertebral soft tissue edema with avid enhancement on post-contrast imaging extending from the level of C2 to the visualized upper thoracic spine. 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 4. Cervical spondylosis as described above.   Electronically Signed   By: Kathreen Devoid   On: 08/09/2015 11:53     Assessment/Plan: MRSA Bacteremia Suspect related to his port, although his previous sputum Cx (8-22) also showed MRSA  Repeat BCx 9-4 are ngtd Consider TEE if he can tolerate. IE not mentioned on his TTE Length of therapy to be determined TEE if he can tolerate. Otherwise aim for 1 month vanco  He would like to go home.   Porta-Cath Infection Place temporary IV until BCx clear Port removed 9-6, appreciate IR eval.  Low back, neck pain MRIs  show C2 and T2 mets. No clear evidence of infection Repeat MRI with sedation done 9-8, shows same- no abscess or discitis. Metastatic disease.   Multifocal pneumonia Improved,  some residual in RML  Anemia Continue to monitor  Hypokalemia improved 9-8  Tongue Ca (stage IV) Last CTX 8-12 (5FU, Carboplatinum, Cetuximab) C2, T2 mets  Wrist pain-  Will defer to Primary to get plain films  Therpaeutic drug monitoring Will continue to follow vanco levels, Cr (stable).  Will follow up repeat level.   Total days of antibiotics: 5 vanco  available as needed.          Bobby Rumpf Infectious Diseases (pager) [phone] [email or web address] 08/10/2015, 10:08 AM  LOS: 6 days

## 2015-08-11 DIAGNOSIS — J9602 Acute respiratory failure with hypercapnia: Secondary | ICD-10-CM

## 2015-08-11 DIAGNOSIS — J9601 Acute respiratory failure with hypoxia: Secondary | ICD-10-CM | POA: Diagnosis present

## 2015-08-11 LAB — COMPREHENSIVE METABOLIC PANEL
ALT: 24 U/L (ref 17–63)
ANION GAP: 10 (ref 5–15)
AST: 27 U/L (ref 15–41)
Albumin: 3.1 g/dL — ABNORMAL LOW (ref 3.5–5.0)
Alkaline Phosphatase: 89 U/L (ref 38–126)
BILIRUBIN TOTAL: 0.5 mg/dL (ref 0.3–1.2)
BUN: 7 mg/dL (ref 6–20)
CO2: 24 mmol/L (ref 22–32)
Calcium: 9.2 mg/dL (ref 8.9–10.3)
Chloride: 98 mmol/L — ABNORMAL LOW (ref 101–111)
Creatinine, Ser: 1.04 mg/dL (ref 0.61–1.24)
GFR calc Af Amer: >60 mL/min (ref >60)
Glucose, Bld: 128 mg/dL — ABNORMAL HIGH (ref 65–99)
POTASSIUM: 4 mmol/L (ref 3.5–5.1)
Sodium: 132 mmol/L — ABNORMAL LOW (ref 135–145)
TOTAL PROTEIN: 7.1 g/dL (ref 6.5–8.1)

## 2015-08-11 LAB — CBC WITH DIFFERENTIAL/PLATELET
BASOS PCT: 0 % (ref 0–1)
Basophils Absolute: 0 10*3/uL (ref 0.0–0.1)
EOS PCT: 1 % (ref 0–5)
Eosinophils Absolute: 0.1 10*3/uL (ref 0.0–0.7)
HEMATOCRIT: 29.5 % — AB (ref 39.0–52.0)
Hemoglobin: 9.9 g/dL — ABNORMAL LOW (ref 13.0–17.0)
LYMPHS ABS: 1 10*3/uL (ref 0.7–4.0)
Lymphocytes Relative: 19 % (ref 12–46)
MCH: 30.3 pg (ref 26.0–34.0)
MCHC: 33.6 g/dL (ref 30.0–36.0)
MCV: 90.2 fL (ref 78.0–100.0)
MONO ABS: 0.7 10*3/uL (ref 0.1–1.0)
Monocytes Relative: 13 % — ABNORMAL HIGH (ref 3–12)
Neutro Abs: 3.2 10*3/uL (ref 1.7–7.7)
Neutrophils Relative %: 67 % (ref 43–77)
Platelets: 570 10*3/uL — ABNORMAL HIGH (ref 150–400)
RBC: 3.27 MIL/uL — ABNORMAL LOW (ref 4.22–5.81)
RDW: 17.6 % — AB (ref 11.5–15.5)
WBC: 5 10*3/uL (ref 4.0–10.5)

## 2015-08-11 LAB — URIC ACID: Uric Acid, Serum: 4.9 mg/dL (ref 4.4–7.6)

## 2015-08-11 LAB — MAGNESIUM: MAGNESIUM: 2.2 mg/dL (ref 1.7–2.4)

## 2015-08-11 MED ORDER — SODIUM CHLORIDE 0.9 % IJ SOLN: 10.0000 mL | INTRAMUSCULAR | Status: DC | PRN

## 2015-08-11 MED ORDER — SODIUM CHLORIDE 0.9 % IJ SOLN
10.0000 mL | Freq: Two times a day (BID) | INTRAMUSCULAR | Status: DC
  Administered 2015-08-11 – 2015-08-12 (×3): 10 mL

## 2015-08-11 MED ORDER — HYDROMORPHONE HCL 2 MG PO TABS
1.0000 mg | ORAL_TABLET | ORAL | Status: DC | PRN
  Administered 2015-08-11 – 2015-08-12 (×8): 1 mg via ORAL
  Filled 2015-08-11 (×8): qty 1

## 2015-08-11 MED ORDER — METHADONE HCL 10 MG PO TABS
20.0000 mg | ORAL_TABLET | Freq: Three times a day (TID) | ORAL | Status: DC
  Administered 2015-08-11 – 2015-08-12 (×3): 20 mg via ORAL
  Filled 2015-08-11 (×3): qty 2

## 2015-08-11 NOTE — Progress Notes (Signed)
Patient was approached twice about wearing CPAP, refused both times.  Patient states that his wrist hurts so bad that he will not sleep.  Patient instructed to notify RN if he changes his mind.  Importance of wearing CPAP while sleeping was discussed with patient.

## 2015-08-11 NOTE — Progress Notes (Signed)
ANTIBIOTIC CONSULT NOTE - FOLLOW UP  Pharmacy Consult for Vancomycin Indication: MRSA bacteremia and PNA  Allergies  Allergen Reactions  . Morphine And Related Anaphylaxis    Patient Measurements: Height: 5\' 9"  (175.3 cm) Weight: 217 lb 2.5 oz (98.5 kg) IBW/kg (Calculated) : 70.7  Vital Signs: Temp: 98.9 F (37.2 C) (09/10 0814) Temp Source: Oral (09/10 0814) BP: 137/86 mmHg (09/10 0814) Pulse Rate: 87 (09/10 0814) Intake/Output from previous day: 09/09 0701 - 09/10 0700 In: 1901 [P.O.:580; I.V.:821; IV Piggyback:500] Out: 1400 [Urine:1400] Intake/Output from this shift:    Labs:  Recent Labs  08/09/15 0251 08/10/15 0248 08/11/15 0319  WBC  --  4.8 5.0  HGB  --  9.7* 9.9*  PLT  --  534* 570*  CREATININE 0.84 0.89 1.04   Estimated Creatinine Clearance: 86.3 mL/min (by C-G formula based on Cr of 1.04). No results for input(s): VANCOTROUGH, VANCOPEAK, VANCORANDOM, GENTTROUGH, GENTPEAK, GENTRANDOM, TOBRATROUGH, TOBRAPEAK, TOBRARND, AMIKACINPEAK, AMIKACINTROU, AMIKACIN in the last 72 hours.   Microbiology: Recent Results (from the past 720 hour(s))  Culture, expectorated sputum-assessment     Status: None   Collection Time: 07/23/15  6:38 AM  Result Value Ref Range Status   Specimen Description SPUTUM  Final   Special Requests NONE  Final   Sputum evaluation   Final    THIS SPECIMEN IS ACCEPTABLE. RESPIRATORY CULTURE REPORT TO FOLLOW.   Report Status 07/23/2015 FINAL  Final  Culture, respiratory (NON-Expectorated)     Status: None   Collection Time: 07/23/15  6:38 AM  Result Value Ref Range Status   Specimen Description SPUTUM  Final   Special Requests NONE  Final   Gram Stain   Final    NO WBC SEEN FEW SQUAMOUS EPITHELIAL CELLS PRESENT FEW GRAM POSITIVE COCCI IN CLUSTERS Performed at [facility]    Culture   Final    MODERATE METHICILLIN RESISTANT STAPHYLOCOCCUS AUREUS Note: RIFAMPIN AND GENTAMICIN SHOULD NOT BE USED AS SINGLE DRUGS FOR TREATMENT  OF STAPH INFECTIONS. This organism DOES NOT demonstrate inducible Clindamycin resistance in vitro. Performed at [facility]    Report Status 07/26/2015 FINAL  Final   Organism ID, Bacteria METHICILLIN RESISTANT STAPHYLOCOCCUS AUREUS  Final      Susceptibility   Methicillin resistant staphylococcus aureus - MIC*    CLINDAMYCIN <=0.25 SENSITIVE Sensitive     ERYTHROMYCIN >=8 RESISTANT Resistant     GENTAMICIN <=0.5 SENSITIVE Sensitive     LEVOFLOXACIN 4 INTERMEDIATE Intermediate     OXACILLIN >=4 RESISTANT Resistant     RIFAMPIN <=0.5 SENSITIVE Sensitive     TRIMETH/SULFA <=10 SENSITIVE Sensitive     VANCOMYCIN 1 SENSITIVE Sensitive     TETRACYCLINE <=1 SENSITIVE Sensitive     * MODERATE METHICILLIN RESISTANT STAPHYLOCOCCUS AUREUS  Blood Culture (routine x 2)     Status: None   Collection Time: 08/04/15  3:27 AM  Result Value Ref Range Status   Specimen Description BLOOD LEFT FOREARM  Final   Special Requests BOTTLES DRAWN AEROBIC AND ANAEROBIC 10CC  Final   Culture  Setup Time   Final    AEROBIC BOTTLE ONLY GRAM POSITIVE COCCI IN CLUSTERS CRITICAL RESULT CALLED TO, READ BACK BY AND VERIFIED WITH: [place] 2221 08/04/15 M.CAMPBELL CONFIRMED BY R.GREENE    Culture METHICILLIN RESISTANT STAPHYLOCOCCUS AUREUS  Final   Report Status 08/06/2015 FINAL  Final   Organism ID, Bacteria METHICILLIN RESISTANT STAPHYLOCOCCUS AUREUS  Final      Susceptibility   Methicillin resistant staphylococcus aureus -  MIC*    CIPROFLOXACIN >=8 RESISTANT Resistant     ERYTHROMYCIN >=8 RESISTANT Resistant     GENTAMICIN <=0.5 SENSITIVE Sensitive     OXACILLIN >=4 RESISTANT Resistant     TETRACYCLINE <=1 SENSITIVE Sensitive     VANCOMYCIN <=0.5 SENSITIVE Sensitive     TRIMETH/SULFA <=10 SENSITIVE Sensitive     CLINDAMYCIN <=0.25 SENSITIVE Sensitive     RIFAMPIN <=0.5 SENSITIVE Sensitive     Inducible Clindamycin NEGATIVE Sensitive     * METHICILLIN RESISTANT STAPHYLOCOCCUS AUREUS  Blood  Culture (routine x 2)     Status: None   Collection Time: 08/04/15  3:27 AM  Result Value Ref Range Status   Specimen Description BLOOD NECK  Final   Special Requests BOTTLES DRAWN AEROBIC AND ANAEROBIC 5CC  Final   Culture NO GROWTH 5 DAYS  Final   Report Status 08/09/2015 FINAL  Final  Urine culture     Status: None   Collection Time: 08/04/15  3:49 AM  Result Value Ref Range Status   Specimen Description URINE, CLEAN CATCH  Final   Special Requests NONE  Final   Culture 2,000 COLONIES/mL INSIGNIFICANT GROWTH  Final   Report Status 08/05/2015 FINAL  Final  MRSA PCR Screening     Status: Abnormal   Collection Time: 08/04/15 10:02 AM  Result Value Ref Range Status   MRSA by PCR POSITIVE (A) NEGATIVE Final    Comment:        The GeneXpert MRSA Assay (FDA approved for NASAL specimens only), is one component of a comprehensive MRSA colonization surveillance program. It is not intended to diagnose MRSA infection nor to guide or monitor treatment for MRSA infections. RESULT CALLED TO, READ BACK BY AND VERIFIED WITH: NOTIFIED M. LESSARD RN 08/04/15 AT 1139 BY A. DAVIS   Culture, blood (routine x 2)     Status: None   Collection Time: 08/05/15 11:45 AM  Result Value Ref Range Status   Specimen Description BLOOD RIGHT ANTECUBITAL  Final   Special Requests BOTTLES DRAWN AEROBIC AND ANAEROBIC 10CC  Final   Culture NO GROWTH 5 DAYS  Final   Report Status 08/10/2015 FINAL  Final  Culture, blood (routine x 2)     Status: None   Collection Time: 08/05/15 12:00 PM  Result Value Ref Range Status   Specimen Description BLOOD BLOOD RIGHT HAND  Final   Special Requests BOTTLES DRAWN AEROBIC AND ANAEROBIC 10CC  Final   Culture NO GROWTH 5 DAYS  Final   Report Status 08/10/2015 FINAL  Final    Anti-infectives    Start     Dose/Rate Route Frequency Ordered Stop   08/08/15 2200  vancomycin (VANCOCIN) IVPB 1000 mg/200 mL premix     1,000 mg 200 mL/hr over 60 Minutes Intravenous Every 12  hours 08/08/15 0719     08/06/15 0630  vancomycin (VANCOCIN) 1,250 mg in sodium chloride 0.9 % 250 mL IVPB  Status:  Discontinued     1,250 mg 166.7 mL/hr over 90 Minutes Intravenous Every 12 hours 08/06/15 0627 08/08/15 0719   08/05/15 1400  ceFAZolin (ANCEF) IVPB 2 g/50 mL premix  Status:  Discontinued     2 g 100 mL/hr over 30 Minutes Intravenous 3 times per day 08/05/15 1044 08/06/15 0919   08/04/15 1800  vancomycin (VANCOCIN) IVPB 750 mg/150 ml premix  Status:  Discontinued     750 mg 150 mL/hr over 60 Minutes Intravenous Every 12 hours 08/04/15 0731 08/06/15 0627   08/04/15 1200  piperacillin-tazobactam (ZOSYN) IVPB 3.375 g  Status:  Discontinued     3.375 g 12.5 mL/hr over 240 Minutes Intravenous 3 times per day 08/04/15 0731 08/05/15 1037   08/04/15 0315  piperacillin-tazobactam (ZOSYN) IVPB 3.375 g     3.375 g 100 mL/hr over 30 Minutes Intravenous  Once 08/04/15 0300 08/04/15 0404   08/04/15 0315  vancomycin (VANCOCIN) IVPB 1000 mg/200 mL premix  Status:  Discontinued     1,000 mg 200 mL/hr over 60 Minutes Intravenous  Once 08/04/15 0300 08/04/15 0309   08/04/15 0315  vancomycin (VANCOCIN) 1,500 mg in sodium chloride 0.9 % 500 mL IVPB     1,500 mg 250 mL/hr over 120 Minutes Intravenous  Once 08/04/15 0309 08/04/15 0543      Assessment: 30 YOM with hx stage IV tongue CA who presented to the [place] on 9/3 with SOB and chest pressure. Of noting, the patient was recently admitted from 8/19 >> 8/23 with N/V, pain, and dehydration. During that admission, respiratory cultures from 8/22 resulted as MRSA on 8/25. Unfortunately, the patient was discharged on 8/23 prior to the result of the cultures and was sent out on Augmentin. 1/2 Blood cultures on this admit are now growing MRSA.  Now day #8 of vancomycin treatment. VT on 9/7 was supratherapeutic so dose was decreased. Afebrile, WBC wnl. SCr remains stable, normalized CrCl ~60ml/min. UOP ok at 0.25ml/kr/hr.  Goal of Therapy:  Vancomycin  trough level 15-20 mcg/ml  Resolution of infection  Plan:  Continue vancomycin 1g IV Q12 Check VT tomorrow Monitor clinical picture, renal function, VT prn Plan for vancomycin for at least 1 month per ID F/U TEE if possible  Shane Cantu 08/11/2015,8:21 AM

## 2015-08-11 NOTE — Progress Notes (Signed)
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 He was seen by his Oncologist on 9/2 with no significant issues other than pain management, and he was told a recent PET noted improvement in his cancer. He presented to the ER after awakening during the night with shortness of breath and chest pressure.   In the ED his temperature was 101.6, his heart rate was 131 bpm sinus tachycardia, room air saturations 66%. Repeat temperature was 102F rectal. Chest x-ray revealed a right basilar airspace opacity concerning for pneumonia. CT angiogram of the chest was without findings of pulmonary embolism but did suggest pneumonia.   HPI/Subjective: 9/10  A/O 4, NAD, negative CP, negative SOB. Patient continues to have pain in the lower C-spine/upper T-spine area. Negative paresthesias in the upper extremity, negative loss of strength upper extremities. However patient continues complaining of right wrist pain (x-ray nondiagnostic). Patient states has history of gout.   Assessment/Plan:  Severe sepsis due to MRSA bacteremia w/ multifocal PNA -Continue current antibiotic regimen as suggested by ID  - results of C-spine/T-spine MRI; metastatic cancer, see results below  - as noted per ID his Port-A-Cath has been removed.  -Blood culture from 9/4 negative; requested placement of PICC line -Spoke with Dr.Jeffrey C Hatcher (ID) and he will place recommendations on length of treatment. -PT/OT consult placed   Acute  hypoxic respiratory failure due to HCAP/Acute hypercarbic respiratory failure due to profoundly severe sleep apnea -Resolved -Utilize CPAP or BiPAP at all times when asleep - monitor closely with supplemental oxygen -Increase methadone to 20 mg TID monitor closely for over sedation  Chronic neck and low back pain -See severe sepsis  -Chronic pain vs  staph aureus discitis, vs spinal osteomyelitis,vs epidural abscess - neurologically he is intact at present  -Patient's MAR shows patient taking methadone 40 mg TID.  -Per patient [name] (oncology) started the methadone.  -Increase methadone 20 mg TID.  Patient's pain currently not controlled (believe decreased methadone too much).     Right wrist pain/swelling acute -X-ray right wrist; nondiagnostic for cause of pain  Chronic Diastolic CHF -Transfuse for hemoglobin<8  Tongue cancer -Per  [name] case was presented at  ENT tumor board on 9/7. In addition communicated to [name] the MRI findings of the C-spine/T-spine, and she feels no place for neurosurgery, and that. Patient will need additional chemotherapy when stable.  Metastatic tongue cancer -See tongue cancer  Anemia due to chemotherapy -See diastolic CHF  Diabetes type 2 controlled -9/5 Hemoglobin A1c= 6.9   Hypomagnesemia -Magnesium goal > 2  -At goal  Hypokalemia -Potassium goal> 4 -At goal   Code Status: FULL Family Communication: no family present at time of exam Disposition Plan: SNF vs home health    Consultants: [name] (oncology) Dr.Jeffrey C Hatcher (ID)   Procedure/Significant Events: 9/4 LE dopplers - no DVT 9/4 echocardiogram;- Left ventricle:  mild LVH.-LVEF= 50% to 55%. Hypokinesis of mid-apicalanteroseptal myocardium. -(grade 1 diastolic dysfunction).  9/8 MRI C-spine with contrast;- negative discitis/osteomyelitis of the cervical spine. - Osseous lesions  in the C2-T2 vertebral bodies most concerning metastatic  disease. There is -Anterior compression deformity (pathologic compression fracture) of T2 vertebral body with significant marrow edema surrounding the bone lesion  - Prevertebral soft tissue edema with avid enhancement on post-contrast imaging extending from the level of C2 -upper thoracic spine. May reflect posttreatment changes of tongue base cancer, but prevertebral soft tissue infection is not excluded. - multilevel disc bulging, the worst at C6-7 right broad-based disc bulge with a left paracentral disc protrusion abutting the ventral cervical spinal cord    Culture 9/3 blood left forearm positive MRSA 9/3 blood neck negative  9/3 urine insignificant growth 9/3 MRSA by PCR positive 9/4 blood right AC/HAND NEGATIVE   Antibiotics: Cefazolin 9/4 > stopped 9/5 Vancomycin 9/3 >   DVT prophylaxis: Lovenox   Devices   LINES / TUBES:      Continuous Infusions: . sodium chloride 30 mL/hr at 08/10/15 2103    Objective: VITAL SIGNS: Temp: 98.9 F (37.2 C) (09/10 0814) Temp Source: Oral (09/10 0814) BP: 137/86 mmHg (09/10 0814) Pulse Rate: 87 (09/10 0814) SPO2; FIO2:   Intake/Output Summary (Last 24 hours) at 08/11/15 1246 Last data filed at 08/11/15 1035  Gross per 24 hour  Intake   1331 ml  Output   1300 ml  Net     31 ml     Exam: General: A/O 4, complains of chronic pain neck and back but tolerable, No acute respiratory distress Eyes: Negative headache, eye pain, scleral hemorrhage ENT: Negative Runny nose, negative ear pain,negative gingival bleeding, Neck:  Negative scars, masses, torticollis, lymphadenopathy, palpation C-spine tender midline, negative T-spine tenderness to palpation, JVD Lungs: Clear to auscultation bilaterally without wheezes or crackles Cardiovascular: Regular rate and rhythm without murmur gallop or rub normal S1 and S2 Abdomen:negative abdominal pain, negative dysphagia, nondistended, positive soft, bowel sounds, no rebound, no  ascites, no appreciable mass Extremities: No significant cyanosis, clubbing, or edema bilateral lower extremities. Right wrist swollen, erythema, painful to palpation, painful to flex/ex Psychiatric:  Negative depression, negative anxiety, negative fatigue, negative mania  Neurologic:  Cranial nerves II through XII intact, tongue/uvula midline, all extremities muscle strength 5/5, sensation intact throughout,within normal limits, negative dysarthria, negative expressive aphasia, negative receptive aphasia.   Data Reviewed: Basic Metabolic Panel:  Recent Labs Lab 08/05/15 0643 08/06/15 0519 08/07/15 0300 08/09/15 0251 08/10/15 0248 08/11/15 0319  NA  --  138 139 137 138 132*  K  --  3.1* 3.3* 4.0 4.3 4.0  CL  --  99* 103 103 103 98*  CO2  --  30 27 24 26 24   GLUCOSE  --  104* 111* 117* 103* 128*  BUN  --  <5* <5* 5* <5* 7  CREATININE  --  0.90 0.86 0.84 0.89 1.04  CALCIUM  --  8.5* 8.8* 9.0 9.5 9.2  MG 1.4*  --  1.3* 1.8 1.6* 2.2   Liver Function Tests:  Recent Labs Lab 08/06/15 0519 08/07/15 0300 08/10/15 0248 08/11/15 0319  AST 34 32 30 27  ALT 16* 16* 25 24  ALKPHOS 72 74 82 89  BILITOT 0.5 0.3 0.5 0.5  PROT 6.4* 6.1* 7.5 7.1  ALBUMIN 2.5* 2.6* 2.9* 3.1*   No results for input(s): LIPASE, AMYLASE in the last 168 hours. No results for input(s): AMMONIA in the last 168 hours. CBC:  Recent Labs Lab 08/04/15 1821 08/06/15 0516 08/07/15 0300 08/10/15 0248 08/11/15 0319  WBC 7.3 5.9 5.0 4.8 5.0  NEUTROABS  --   --   --  3.1 3.2  HGB 7.6* 7.9* 8.0* 9.7* 9.9*  HCT 23.9* 25.2* 25.3* 30.0* 29.5*  MCV 93.7 92.6 91.3 92.6 90.2  PLT 165 264 335 534* 570*   Cardiac Enzymes:  Recent Labs Lab 08/04/15 1257 08/04/15 1821 08/04/15 2333  TROPONINI 0.36* 0.34* 0.28*   BNP (last 3 results) No results for input(s): BNP in the last 8760 hours.  ProBNP (last 3 results) No results for input(s): PROBNP in the last 8760 hours.  CBG:  Recent Labs Lab 08/06/15 2146  08/07/15 0801 08/07/15 1313 08/07/15 1654 08/08/15 1148  GLUCAP 123* 144* 130* 127* 130*    Recent Results (from the past 240 hour(s))  Blood Culture (routine x 2)     Status: None   Collection Time: 08/04/15  3:27 AM  Result Value Ref Range Status   Specimen Description BLOOD LEFT FOREARM  Final   Special Requests BOTTLES DRAWN AEROBIC AND ANAEROBIC 10CC  Final   Culture  Setup Time   Final    AEROBIC BOTTLE ONLY GRAM POSITIVE COCCI IN CLUSTERS CRITICAL RESULT CALLED TO, READ BACK BY AND VERIFIED WITH: [place] 2221 08/04/15 M.CAMPBELL CONFIRMED BY R.GREENE    Culture METHICILLIN RESISTANT STAPHYLOCOCCUS AUREUS  Final   Report Status 08/06/2015 FINAL  Final   Organism ID, Bacteria METHICILLIN RESISTANT STAPHYLOCOCCUS AUREUS  Final      Susceptibility   Methicillin resistant staphylococcus aureus - MIC*    CIPROFLOXACIN >=8 RESISTANT Resistant     ERYTHROMYCIN >=8 RESISTANT Resistant     GENTAMICIN <=0.5 SENSITIVE Sensitive     OXACILLIN >=4 RESISTANT Resistant     TETRACYCLINE <=1 SENSITIVE Sensitive     VANCOMYCIN <=0.5 SENSITIVE Sensitive     TRIMETH/SULFA <=10 SENSITIVE Sensitive     CLINDAMYCIN <=0.25 SENSITIVE Sensitive     RIFAMPIN <=0.5 SENSITIVE Sensitive     Inducible Clindamycin NEGATIVE Sensitive     * METHICILLIN RESISTANT STAPHYLOCOCCUS AUREUS  Blood Culture (routine x 2)     Status: None   Collection Time: 08/04/15  3:27 AM  Result Value Ref Range Status   Specimen Description BLOOD NECK  Final   Special Requests BOTTLES DRAWN AEROBIC AND ANAEROBIC 5CC  Final   Culture NO GROWTH 5 DAYS  Final   Report Status 08/09/2015 FINAL  Final  Urine culture     Status: None   Collection Time: 08/04/15  3:49 AM  Result Value Ref Range Status   Specimen Description URINE, CLEAN CATCH  Final   Special Requests NONE  Final   Culture 2,000 COLONIES/mL INSIGNIFICANT GROWTH  Final   Report Status 08/05/2015 FINAL  Final  MRSA PCR Screening     Status: Abnormal    Collection Time: 08/04/15 10:02 AM  Result Value Ref Range Status   MRSA by PCR POSITIVE (A) NEGATIVE Final    Comment:        The GeneXpert MRSA Assay (FDA approved for NASAL specimens only), is one component of a comprehensive MRSA colonization surveillance program. It is not intended to diagnose MRSA infection nor to guide or monitor treatment for MRSA infections. RESULT CALLED TO, READ BACK BY AND VERIFIED WITH: NOTIFIED M. LESSARD RN 08/04/15 AT 1139 BY A. DAVIS   Culture, blood (routine x 2)     Status: None   Collection Time: 08/05/15 11:45 AM  Result Value Ref Range Status   Specimen Description BLOOD RIGHT ANTECUBITAL  Final   Special Requests BOTTLES DRAWN AEROBIC AND ANAEROBIC 10CC  Final   Culture NO GROWTH  5 DAYS  Final   Report Status 08/10/2015 FINAL  Final  Culture, blood (routine x 2)     Status: None   Collection Time: 08/05/15 12:00 PM  Result Value Ref Range Status   Specimen Description BLOOD BLOOD RIGHT HAND  Final   Special Requests BOTTLES DRAWN AEROBIC AND ANAEROBIC 10CC  Final   Culture NO GROWTH 5 DAYS  Final   Report Status 08/10/2015 FINAL  Final     Studies:  Recent x-ray studies have been reviewed in detail by the Attending Physician  Scheduled Meds:  Scheduled Meds: . albuterol  2.5 mg Nebulization BID  . antiseptic oral rinse  7 mL Mouth Rinse QID  . aspirin EC  81 mg Oral Daily  . chlorhexidine gluconate  15 mL Mouth Rinse BID  . enoxaparin (LOVENOX) injection  50 mg Subcutaneous Q24H  . lactose free nutrition  2 Container Oral TID WC  . methadone  20 mg Oral Q8H  . modafinil  200 mg Oral Daily  . pantoprazole  40 mg Oral Q1200  . tobramycin  1 drop Left Eye 6 times per day  . vancomycin  1,000 mg Intravenous Q12H    Time spent on care of this patient: 40 mins   WOODS, Geraldo Docker , MD  [facility] Office  [phone] Pager [phone]  On-Call/Text Page:      [email or web address]      password [email or web address]  If 7PM-7AM, please  contact night-coverage [email or web address] Password [email or web address] 08/11/2015, 12:46 PM   LOS: 7 days   Care during the described time interval was provided by me .  I have reviewed this patient's available data, including medical history, events of note, physical examination, and all test results as part of my evaluation. I have personally reviewed and interpreted all radiology studies.   Dia Crawford, MD [phone] Pager

## 2015-08-11 NOTE — Progress Notes (Signed)
Patient placed on CPAP Auto-bilevel (min 8 max 20cmH20) for HS using FFM.  Equipment and procedure as well as importance of compliance discussed with patient at length.  Patient needs outpatient sleep study, as he does not have a functioning machine at home.  2L oxygen bleed in.  Patient is able to remove mask if necessary.

## 2015-08-11 NOTE — Progress Notes (Signed)
Peripherally Inserted Central Catheter/Midline Placement  The IV Nurse has discussed with the patient and/or persons authorized to consent for the patient, the purpose of this procedure and the potential benefits and risks involved with this procedure.  The benefits include less needle sticks, lab draws from the catheter and patient may be discharged home with the catheter.  Risks include, but not limited to, infection, bleeding, blood clot (thrombus formation), and puncture of an artery; nerve damage and irregular heat beat.  Alternatives to this procedure were also discussed.  PICC/Midline Placement Documentation  PICC / Midline Single Lumen 97/98/92 PICC Right Basilic 39 cm 0 cm (Active)  Indication for Insertion or Continuance of Line Home intravenous therapies (PICC only);Prolonged intravenous therapies 08/11/2015  3:12 PM  Exposed Catheter (cm) 0 cm 08/11/2015  3:12 PM  Site Assessment Clean;Dry;Intact 08/11/2015  3:12 PM  Line Status Flushed;Saline locked;Blood return noted 08/11/2015  3:12 PM  Dressing Type Transparent 08/11/2015  3:12 PM  Dressing Status Clean;Dry;Intact;Antimicrobial disc in place 08/11/2015  3:12 PM  Line Care Connections checked and tightened 08/11/2015  3:12 PM  Line Adjustment (NICU/IV Team Only) No 08/11/2015  3:12 PM  Dressing Intervention New dressing 08/11/2015  3:12 PM  Dressing Change Due 08/18/15 08/11/2015  3:12 PM       Shane Cantu 08/11/2015, 3:12 PM

## 2015-08-12 LAB — CBC WITH DIFFERENTIAL/PLATELET
BASOS ABS: 0 10*3/uL (ref 0.0–0.1)
Basophils Relative: 1 % (ref 0–1)
Eosinophils Absolute: 0 10*3/uL (ref 0.0–0.7)
Eosinophils Relative: 1 % (ref 0–5)
HEMATOCRIT: 29.6 % — AB (ref 39.0–52.0)
HEMOGLOBIN: 9.6 g/dL — AB (ref 13.0–17.0)
LYMPHS PCT: 15 % (ref 12–46)
Lymphs Abs: 0.9 10*3/uL (ref 0.7–4.0)
MCH: 29.9 pg (ref 26.0–34.0)
MCHC: 32.4 g/dL (ref 30.0–36.0)
MCV: 92.2 fL (ref 78.0–100.0)
MONO ABS: 1.2 10*3/uL — AB (ref 0.1–1.0)
Monocytes Relative: 21 % — ABNORMAL HIGH (ref 3–12)
NEUTROS ABS: 3.5 10*3/uL (ref 1.7–7.7)
NEUTROS PCT: 62 % (ref 43–77)
Platelets: 519 10*3/uL — ABNORMAL HIGH (ref 150–400)
RBC: 3.21 MIL/uL — ABNORMAL LOW (ref 4.22–5.81)
RDW: 17.8 % — AB (ref 11.5–15.5)
WBC: 5.6 10*3/uL (ref 4.0–10.5)

## 2015-08-12 LAB — COMPREHENSIVE METABOLIC PANEL
ALK PHOS: 83 U/L (ref 38–126)
ALT: 20 U/L (ref 17–63)
AST: 20 U/L (ref 15–41)
Albumin: 2.9 g/dL — ABNORMAL LOW (ref 3.5–5.0)
Anion gap: 10 (ref 5–15)
BILIRUBIN TOTAL: 0.8 mg/dL (ref 0.3–1.2)
BUN: 10 mg/dL (ref 6–20)
CALCIUM: 9.1 mg/dL (ref 8.9–10.3)
CO2: 25 mmol/L (ref 22–32)
CREATININE: 1.01 mg/dL (ref 0.61–1.24)
Chloride: 99 mmol/L — ABNORMAL LOW (ref 101–111)
GFR calc Af Amer: >60 mL/min (ref >60)
GLUCOSE: 104 mg/dL — AB (ref 65–99)
POTASSIUM: 3.5 mmol/L (ref 3.5–5.1)
Sodium: 134 mmol/L — ABNORMAL LOW (ref 135–145)
TOTAL PROTEIN: 7 g/dL (ref 6.5–8.1)

## 2015-08-12 LAB — MAGNESIUM: MAGNESIUM: 1.6 mg/dL — AB (ref 1.7–2.4)

## 2015-08-12 MED ORDER — VANCOMYCIN HCL IN DEXTROSE 1-5 GM/200ML-% IV SOLN: 1000.0000 mg | Freq: Two times a day (BID) | INTRAVENOUS | Status: DC

## 2015-08-12 MED ORDER — POTASSIUM CHLORIDE CRYS ER 20 MEQ PO TBCR
40.0000 meq | EXTENDED_RELEASE_TABLET | Freq: Once | ORAL | Status: AC
  Administered 2015-08-12: 40 meq via ORAL
  Filled 2015-08-12: qty 2

## 2015-08-12 MED ORDER — MAGNESIUM SULFATE 50 % IJ SOLN
3.0000 g | Freq: Once | INTRAVENOUS | Status: AC
  Administered 2015-08-12: 3 g via INTRAVENOUS
  Filled 2015-08-12: qty 6

## 2015-08-12 MED ORDER — MAGNESIUM OXIDE 400 (241.3 MG) MG PO TABS
400.0000 mg | ORAL_TABLET | Freq: Once | ORAL | Status: AC
  Administered 2015-08-12: 400 mg via ORAL
  Filled 2015-08-12: qty 1

## 2015-08-12 MED ORDER — METHADONE HCL 10 MG PO TABS: 20.0000 mg | ORAL_TABLET | Freq: Three times a day (TID) | ORAL | Status: DC

## 2015-08-12 NOTE — Progress Notes (Signed)
CM Faxed RX for Vanc BID to [facility] pharmacy and received confirmation. Cm called and confirmed with Tiffany @ [place] that pt was due for 1st dose tonight and [facility] accepted referral and stated that someone would be there to administer dose tonight and medication should be to his house by 2000. Cm previously confirmed address and contact number with pt. No further CM needs communicated.

## 2015-08-12 NOTE — Progress Notes (Signed)
Pt being d/c'd home with family via wheelchair. IV and tele d/c'd. VSS. All belongings gathered and taken with patient. PICC line in place. D/c instructions, medications and prescriptions were reviewed and given. All questions answered. Understanding verbalized.

## 2015-08-12 NOTE — Discharge Summary (Signed)
Physician Discharge Summary  Shane Cantu VOZ:[id number] DOB: 11-Jun-1956 DOA: 08/04/2015  PCP: [name], MD  Admit date: 08/04/2015 Discharge date: 08/12/2015  Time spent: 40 minutes  Recommendations for Outpatient Follow-up:   Severe sepsis due to MRSA bacteremia w/ multifocal PNA -Continue current antibiotic regimen as suggested by ID  - results of C-spine/T-spine MRI; metastatic cancer, see results below  - as noted per ID his Port-A-Cath has been removed.  -Blood culture from 9/4 negative; requested placement of PICC line -9/11 spoke with Dr.Jeffrey C Hatcher (ID) this a.m. and patient will be treated for 6 weeks with  vancomycin. -9/10 midline PICC placed. -Follow-up in 4 weeks with Dr. Bobby Rumpf (infectious disease) for MRSA bacteremia  Acute hypoxic respiratory failure due to HCAP/Acute hypercarbic respiratory failure due to profoundly severe sleep apnea -Resolved -Utilize CPAP or BiPAP at all times when asleep - monitor closely with supplemental oxygen -Pain well controlled on methadone to 20 mg TID, discharge patient on this dose   Chronic neck and low back pain -See severe sepsis  -Acute on Chronic pain secondary to bone metastases. Studies not consistent with Spinal Osteomyelitis. - neurologically he is intact  -On admission Patient's MAR shows patient taking methadone 40 mg TID (accidental overdose).  -Discharge on methadone 20 mg TID.Would not increase patient's pain currently controlled.   Right wrist pain/swelling acute -X-ray right wrist; nondiagnostic for cause of pain -Negative for gout flare. -Swelling/pain resolving with removal of IV. -PCP to monitor; Follow-up in one week with Dr. Cecille Amsterdam   Chronic Diastolic CHF -Transfuse for hemoglobin<8  Metastatic Tongue cancer -Per [name] case was presented at ENT tumor board on 9/7. In addition communicated to [name] the MRI findings of the C-spine/T-spine, and she feels no  place for neurosurgery, and that. Patient will need additional chemotherapy when stable. -Follow-up with [name] (oncology) in 2 weeks to discuss resumption of chemotherapy  Anemia due to chemotherapy -See diastolic CHF -Stable. Appears patient's baseline Hb is going to remain around mid 9  Diabetes type 2 controlled -9/5 Hemoglobin A1c= 6.9   Hypomagnesemia -Magnesium goal > 2  -Magnesium oxide 400 mg prior to discharge   Hypokalemia -Potassium goal> 4 -K-Dur 40 mEq prior to discharge     Discharge Diagnoses:  Principal Problem:   Severe sepsis Active Problems:   Tongue cancer   Chronic neck pain   Anemia due to antineoplastic chemotherapy   Chemotherapy-induced nausea   HCAP (healthcare-associated pneumonia)   Sepsis   Acute respiratory failure with hypoxia   Elevated troponin   Acute hyperglycemia   RBBB   OSA (obstructive sleep apnea)   Closed right ankle fracture   HTN (hypertension)   QT prolongation   Bacteremia   Metastatic cancer   Staphylococcus aureus bacteremia with sepsis   Infection due to portacath   Screen for STD (sexually transmitted disease)   MRSA bacteremia   Acute dyspnea   Severe sepsis with acute organ dysfunction   Acute respiratory failure with hypoxia and hypercarbia   OSA on CPAP   Chronic neck and back pain   Chronic diastolic congestive heart failure   Anemia due to chemotherapy   Hyperglycemia   Hypokalemia   OSA treated with BiPAP   Metastatic cancer to bone   Diabetes type 2, controlled   Right wrist pain   Acute respiratory failure with hypoxia and hypercapnia   Discharge Condition: Stable  Diet recommendation: Heart Healthy  Filed Weights   08/05/15  8315 08/06/15 0513 08/09/15 0500  Weight: 104.4 kg (230 lb 2.6 oz) 104.8 kg (231 lb 0.7 oz) 98.5 kg (217 lb 2.5 oz)    History of present illness:  59 yo WM PMHx OSA, stage IV tongue cancer on chemotherapy (treatment course complicated by ongoing intractable  neck pain and nausea without vomiting), Hypertension, GERD, nondisplaced right ankle fracture (occurred in July) stable with utilization of fracture boot, who was discharged on 8/23 after being hospitalized for intractable nausea and dehydration secondary to chemotherapy. He was seen by his Oncologist on 9/2 with no significant issues other than pain management, and he was told a recent PET noted improvement in his cancer. He presented to the ER after awakening during the night with shortness of breath and chest pressure.   In the ED his temperature was 101.6, his heart rate was 131 bpm sinus tachycardia, room air saturations 66%. Repeat temperature was 102F rectal. Chest x-ray revealed a right basilar airspace opacity concerning for pneumonia. CT angiogram of the chest was without findings of pulmonary embolism but did suggest pneumonia.  During this hospitalization patient was found to have severe sepsis secondary to MRSA bacteremia and was placed on appropriate antibiotics. In addition patient was suffering from acute on chronic neck/upper back pain. This pain was secondary to bone metastases from his tongue cancer. Lastly patient's pain medication was adjusted secondary to patient having accidental overdose on methadone.    Consultants: [name] (oncology) Dr.Jeffrey C Hatcher (ID)   Procedure/Significant Events: 9/4 LE dopplers - no DVT 9/4 echocardiogram;- Left ventricle: mild LVH.-LVEF= 50% to 55%. Hypokinesis of mid-apicalanteroseptal myocardium. -(grade 1 diastolic dysfunction). 9/8 MRI C-spine with contrast;- negative discitis/osteomyelitis of the cervical spine. - Osseous lesions in the C2-T2 vertebral bodies most concerning metastatic disease. There is -Anterior compression deformity (pathologic compression fracture) of T2 vertebral body with significant marrow edema surrounding the bone lesion  - Prevertebral soft tissue edema with avid enhancement on post-contrast imaging  extending from the level of C2 -upper thoracic spine. May reflect posttreatment changes of tongue base cancer, but prevertebral soft tissue infection is not excluded. - multilevel disc bulging, the worst at C6-7 right broad-based disc bulge with a left paracentral disc protrusion abutting the ventral cervical spinal cord    Culture 9/3 blood left forearm positive MRSA 9/3 blood neck negative  9/3 urine insignificant growth 9/3 MRSA by PCR positive 9/4 blood right AC/HAND NEGATIVE   Antibiotics: Cefazolin 9/4 > stopped 9/5 Vancomycin 9/3 >    Discharge Exam: Filed Vitals:   08/12/15 0458 08/12/15 0800 08/12/15 0831 08/12/15 1200  BP: 116/78 137/83  117/69  Pulse: 88 84  94  Temp: 98.3 F (36.8 C) 98.1 F (36.7 C)  97.9 F (36.6 C)  TempSrc: Axillary Oral  Oral  Resp: 13 11  22   Height:      Weight:      SpO2:   97% 96%    General: A/O 4, complains of chronic pain neck and back but tolerable, No acute respiratory distress Eyes: Negative headache, eye pain, scleral hemorrhage ENT: Negative Runny nose, negative ear pain,negative gingival bleeding, Neck: Negative scars, masses, torticollis, lymphadenopathy, palpation C-spine tender midline, negative T-spine tenderness to palpation, JVD Lungs: Clear to auscultation bilaterally without wheezes or crackles Cardiovascular: Regular rate and rhythm without murmur gallop or rub normal S1 and S2 Abdomen:negative abdominal pain, negative dysphagia, nondistended, positive soft, bowel sounds, no rebound, no ascites, no appreciable mass Extremities: No significant cyanosis, clubbing, or edema bilateral  lower extremities. Right wrist swollen (decreased post removal of IV line ), negative erythema, still painful to palpation/flex/extension, but overall improving   Discharge Instructions     Medication List    ASK your doctor about these medications        amLODipine 5 MG tablet  Commonly known as:  NORVASC  Take 5 mg by mouth  daily.     Armodafinil 250 MG tablet  Take 250 mg by mouth daily.     aspirin EC 81 MG tablet  Take 81 mg by mouth daily.     clindamycin 1 % gel  Commonly known as:  CLINDAGEL  APPLY TO AFFECTED AREA(S) TWICE DAILY     diphenhydrAMINE 25 mg capsule  Commonly known as:  BENADRYL  Take 25 mg by mouth every 6 (six) hours as needed for itching.     famotidine 20 MG tablet  Commonly known as:  PEPCID  Take 20 mg by mouth daily as needed (burning sensation).     fluticasone 50 MCG/ACT nasal spray  Commonly known as:  FLONASE  Place 2 sprays into both nostrils daily.     gabapentin 100 MG capsule  Commonly known as:  NEURONTIN  Take 600 mg by mouth 2 (two) times daily. Takes with supper and at bedtime     guaiFENesin 600 MG 12 hr tablet  Commonly known as:  MUCINEX  Take 1 tablet (600 mg total) by mouth 2 (two) times daily.     hydrocortisone cream 0.5 %  Apply topically 3 (three) times daily.     HYDROmorphone 4 MG tablet  Commonly known as:  DILAUDID  Take 1 tablet (4 mg total) by mouth every 4 (four) hours as needed for severe pain.     ibuprofen 200 MG tablet  Commonly known as:  ADVIL,MOTRIN  Take 600 mg by mouth every 6 (six) hours as needed for mild pain or moderate pain.     lansoprazole 30 MG capsule  Commonly known as:  PREVACID  Take 30 mg by mouth daily at 12 noon.     lidocaine 2 % solution  Commonly known as:  XYLOCAINE  Patient: Mix 1part 2% viscous lidocaine, 1part H20. Swish and/or swallow 86mL of this mixture, 14min before meals and at bedtime, up to QID     lidocaine 5 %  Commonly known as:  LIDODERM  Place 1 patch onto the skin daily. Remove & Discard patch within 12 hours or as directed by MD     lidocaine-prilocaine cream  Commonly known as:  EMLA  Apply 1 application topically as needed. To Port a cath site one hour prior to needle stick     Magnesium Oxide 400 MG Caps  Take 1 capsule (400 mg total) by mouth daily.     methadone 10 MG  tablet  Commonly known as:  DOLOPHINE  Take 3 tablets (30 mg total) by mouth every 8 (eight) hours.     metoCLOPramide 10 MG tablet  Commonly known as:  REGLAN  Take 1 tablet (10 mg total) by mouth 4 (four) times daily.     ondansetron 8 MG tablet  Commonly known as:  ZOFRAN  Take 1 tablet (8 mg total) by mouth every 8 (eight) hours as needed for nausea.     polyethylene glycol powder powder  Commonly known as:  GLYCOLAX/MIRALAX  Take 17 g by mouth 2 (two) times daily.     promethazine 25 MG tablet  Commonly known as:  PHENERGAN  Take 1 tablet (25 mg total) by mouth every 6 (six) hours as needed for nausea.     PUMP IN STYLE ADVANCED Misc  Inject 1 cartridge into the vein See admin instructions. Adrucil 9350 mg  2.13ml/hr. Runs for 96 hours     scopolamine 1 MG/3DAYS  Commonly known as:  TRANSDERM-SCOP  Place 1 patch (1.5 mg total) onto the skin every 3 (three) days.     sucralfate 1 G tablet  Commonly known as:  CARAFATE  Dissolve 1 tablet in 10 mL H20 and swallow up to QID as needed for sore throat     tobramycin 0.3 % ophthalmic solution  Commonly known as:  TOBREX  Place 1 drop into the left eye every 4 (four) hours.     traZODone 100 MG tablet  Commonly known as:  DESYREL  Take 100 mg by mouth at bedtime.       Allergies  Allergen Reactions  . Morphine And Related Anaphylaxis      The results of significant diagnostics from this hospitalization (including imaging, microbiology, ancillary and laboratory) are listed below for reference.    Significant Diagnostic Studies: Dg Chest 2 View  08/05/2015   CLINICAL DATA:  Cough, chest congestion and shortness of breath.  EXAM: CHEST  2 VIEW  COMPARISON:  Yesterday.  FINDINGS: Improved inspiration. Normal sized heart. Mildly prominent interstitial markings and mild central peribronchial thickening without significant change. Decreased airspace opacity at the medial right lung base with persistent opacity seen in the  right middle lobe on the lateral view. Interval minimal patchy opacity in the right upper lobe. The left lung base is currently clear. Mild thoracic spine degenerative changes. Stable right jugular porta catheter.  IMPRESSION: 1. Improved aeration at the right lung base with residual patchy opacity in the right middle lobe concerning for pneumonia. 2. Interval minimal right upper lobe pneumonia. 3. Mild chronic bronchitic changes.   Electronically Signed   By: Claudie Revering M.D.   On: 08/05/2015 09:42   Dg Chest 2 View  07/22/2015   CLINICAL DATA:  Patient with cough, congestion and sore throat.  EXAM: CHEST  2 VIEW  COMPARISON:  Chest radiograph 06/04/2015  FINDINGS: Anterior chest wall Port-A-Cath is stable in position with tip projecting over the superior vena cava. Stable cardiac and mediastinal contours. No consolidative pulmonary opacities. No pleural effusion or pneumothorax. Mid thoracic spine degenerative changes.  IMPRESSION: No acute cardiopulmonary process.   Electronically Signed   By: Lovey Newcomer M.D.   On: 07/22/2015 15:07   Dg Wrist 2 Views Right  08/10/2015   CLINICAL DATA:  Acute right wrist pain and swelling without known injury.  EXAM: RIGHT WRIST - 2 VIEW  COMPARISON:  None.  FINDINGS: There is no evidence of fracture or dislocation. There is no evidence of arthropathy or other focal bone abnormality. Soft tissues are unremarkable.  IMPRESSION: Normal right wrist.   Electronically Signed   By: Marijo Conception, M.D.   On: 08/10/2015 21:45   Dg Ankle Complete Right  07/23/2015   CLINICAL DATA:  Right ankle fracture.  EXAM: RIGHT ANKLE - COMPLETE 3+ VIEW  COMPARISON:  06/04/2015  FINDINGS: Oblique fracture of the lateral malleolus, Weber B morphology. Medial mortise remains widened at 7 mm, indicating deltoid ligament tear. There is some early healing response along the lateral malleolar fracture without union. I do not observe a posterior malleolar component of the fracture.  Plantar and  Achilles calcaneal spurs. No other fractures  identified.  IMPRESSION: 1. Oblique lateral malleolar fracture compatible with Weber B injury (supination -external rotation). Abnormally widened medial mortise indicating deltoid ligament tear. This fracture is typically considered unstable.   Electronically Signed   By: Van Clines M.D.   On: 07/23/2015 13:46   Ct Angio Chest Pe W/cm &/or Wo Cm  08/04/2015   CLINICAL DATA:  Shortness of breath, history of stage IV head and neck cancer.  EXAM: CT ANGIOGRAPHY CHEST WITH CONTRAST  TECHNIQUE: Multidetector CT imaging of the chest was performed using the standard protocol during bolus administration of intravenous contrast. Multiplanar CT image reconstructions and MIPs were obtained to evaluate the vascular anatomy. Per technologist note, patient has power port was injected and leak, which Re occurred on second attempt.  CONTRAST:  130mL OMNIPAQUE IOHEXOL 350 MG/ML SOLN  COMPARISON:  Chest radiograph August 04, 2015 at 3:11 p.m.  FINDINGS: Suboptimal pulmonary arterial contrast opacification. Main pulmonary artery is not enlarged. No pulmonary arterial filling defects to the segmental branches, limited assessment of distal vessels due to contrast bolus timing.  The heart is mildly enlarged. Mild coronary artery calcifications. No pericardial fluid collections. No lymphadenopathy by CT size criteria.  Patchy consolidation RIGHT upper lobe with air bronchogram. Patchy ground-glass opacities RIGHT middle lobe. Pleural thickening and atelectasis in the lung bases with patchy LEFT lower lobe ground-glass opacities. Tracheobronchial tree is patent and midline. No pneumothorax.  Included view of the abdomen is normal. Ill-defined sclerosis RIGHT T11 pedicle. Subcentimeter sclerotic lesion LEFT T4. Scleroses and probable large T2 inferior endplate Schmorl's node. Inferior manubrial focal sclerosis. Subacute RIGHT posterior rib fractures. Single lumen RIGHT chest  Port-A-Cath.  Review of the MIP images confirms the above findings.  IMPRESSION: Suboptimal contrast bolus timing without pulmonary embolism to at least the segmental branches.  Multifocal consolidation lungs compatible with pneumonia. Additional atelectasis/scarring.  Sclerotic lesions in the spine concerning for metastatic disease. Possible manubrial metastasis. These would better be better characterized on MRI of the thoracic spine as clinically indicated, on a nonemergent basis.  Mild cardiomegaly.   Electronically Signed   By: Elon Alas M.D.   On: 08/04/2015 06:24   Mr Cervical Spine Wo Contrast  08/08/2015   CLINICAL DATA:  Initial evaluation bacteremia.  EXAM: MRI CERVICAL SPINE WITHOUT CONTRAST  TECHNIQUE: Multiplanar, multisequence MR imaging of the cervical spine was performed. No intravenous contrast was administered.  COMPARISON:  Prior PET-CT from 08/02/2015.  FINDINGS: Study is limited by motion artifact and patient's inability to tolerate the full length of the exam.  Visualized portions of the brain demonstrate no acute abnormality. Craniocervical junction widely patent.  Mild straightening of the normal cervical lordosis. Vertebral body heights maintained.  There is a 9 mm T1 hypointense, stir hyperintense lesion within the C2 vertebral body, concerning for osseous metastasis. Similarly, there is a proximally 11 mm T1 hypointense, stir hyperintense lesion within the T2 vertebral body, also concerning for osseous metastasis. Surrounding edema within the adjacent T2 vertebral body. No definite associated pathologic fracture. No extraosseous tumor. No other focal osseous lesions.  No definite evidence for osteomyelitis discitis on this examination.  Signal intensity within the cervical spinal cord is normal.  Minimal prevertebral edema. Remainder the paraspinous soft tissues within normal limits.  C2-C3: Bilateral uncovertebral spurring and facet arthrosis. No significant canal or foraminal  stenosis.  C3-C4: Mild bilateral uncovertebral hypertrophy and facet arthrosis, slightly worse on the left. Resultant very mild bilateral foraminal narrowing. No significant canal stenosis.  C4-C5: Mild bilateral uncovertebral  hypertrophy with predominately left-sided facet arthrosis. Resultant mild bilateral foraminal narrowing, slightly worse on the left. There is superimposed annular disc bulge indenting the ventral thecal sac with resultant minimal canal stenosis.  C5-C6: Broad-based diffuse degenerative disc osteophyte with bilateral uncovertebral spurring and facet arthrosis. There is resultant moderate bilateral foraminal narrowing, right worse than left. Posterior disc osteophyte flattens and largely effaces the ventral thecal sac and results in mild to moderate canal stenosis. Mild cord flattening without cord signal changes.  C6-C7: Degenerative disc bulge with bilateral uncovertebral spurring and intervertebral disc space narrowing. Mild bilateral facet arthrosis. There is resultant moderate bilateral foraminal narrowing, slightly worse on the right. Posterior disc bulge indents the ventral thecal sac and results in mild canal stenosis.  C7-T1: Mild disc bulge with bilateral uncovertebral spurring. Resultant mild bilateral foraminal stenosis. No significant canal stenosis.  Remainder of the upper thoracic spine demonstrates no acute abnormality.  IMPRESSION: 1. Limited study due to motion artifact and patient's inability to tolerate the entirety of the exam. 2. No convincing evidence for active infection within the cervical spine. There is mild prevertebral edema, which may be related to treatment of tongue base cancer. 3. Osseous metastases within the C2 and T2 vertebral bodies. 4. Multilevel degenerative spondylolysis as above, most severe at C5-6 and C6-7.   Electronically Signed   By: Jeannine Boga M.D.   On: 08/08/2015 06:55   Mr Cervical Spine W Contrast  08/09/2015   CLINICAL DATA:   History of stage IV head and neck cancer. The patient is currently receiving chemotherapy. Severe low back pain. Question infectious process or metastatic disease. The patient has known osseous metastases in the thoracic spine. Subsequent encounter.  EXAM: MRI CERVICAL SPINE WITH CONTRAST  TECHNIQUE: Multiplanar and multiecho pulse sequences of the cervical spine, to include the craniocervical junction and cervicothoracic junction, were obtained according to standard protocol with intravenous contrast.  CONTRAST:  20 mL MULTIHANCE GADOBENATE DIMEGLUMINE 529 MG/ML IV SOLN  COMPARISON:  CT abdomen and pelvis 08/11/2007. PET CT scan 08/02/2015. CT chest 08/04/2015.  FINDINGS: Vertebral body height and alignment are maintained. There is abnormal decreased T1 and T2 signal in the posterior aspect of the T11 vertebral body on the right extending into the pedicle. This correlates with the treated/sclerotic metastatic deposits seen on the prior chest CT and PET CT. No other evidence of metastatic disease is identified. A small hemangioma in T12 is incidentally noted. The patient is status post L4-5 and L5-S1 fusion. Pedicle screws and stabilization bars remain in place at L4-5. Solid bridging bone is seen across both disc interspaces. A defect in the posterior right ilium is most consistent with prior bone graft harvest site. The conus medullaris is normal in signal and position. No abscess is identified. There is no evidence of discitis or osteomyelitis. Imaged intra-abdominal contents demonstrate a small cyst in the right kidney but are otherwise unremarkable.  The T11-12 level is imaged in the sagittal plane only and negative.  T12-L1:  Negative.  L1-2:  Negative.  L2-3:  Negative.  L3-4: There is some facet degenerative change and a shallow disc bulge. The central spinal canal and foramina and left foramen are open. There is mild right foraminal narrowing.  L4-5: Status post laminectomy and fusion. The central canal and  foramina are patent.  L5-S1: Status post laminectomy and fusion. The central canal and foramina are patent.  IMPRESSION: Focus of abnormal signal in the posterior at aspect of the T11 vertebral body on the right  extending into the pedicle correlates with a sclerotic/treated metastasis as seen on prior studies. No other evidence of metastatic disease is identified.  Negative for abscess or discitis.  Status post L4-S1 fusion. The levels are solidly fused and the central canal and foramina are widely patent.   Electronically Signed   By: Inge Rise M.D.   On: 08/09/2015 11:32   Mr Lumbar Spine W Wo Contrast  08/09/2015   CLINICAL DATA:  MRSA bacteremia  EXAM: MRI CERVICAL SPINE WITHOUT AND WITH CONTRAST  TECHNIQUE: Multiplanar and multiecho pulse sequences of the cervical spine, to include the craniocervical junction and cervicothoracic junction, were obtained without and with intravenous contrast.  CONTRAST:  36mL MULTIHANCE GADOBENATE DIMEGLUMINE 529 MG/ML IV SOLN  COMPARISON:  08/07/2015  FINDINGS: Limited examination secondary to patient motion degrading image quality.  The cervical cord is normal in size and signal. Vertebral body heights are maintained. There is degenerative disc disease of the cervical spine most severe at C5-6 and C6-7. The cervical spine is normal in lordotic alignment. No static listhesis. There is a persistent 9 mm T1 hypo intense enhancing C2 vertebral body lesion most concerning for metastatic disease. There is a 11 mm T1 hypo intense enhancing T2 vertebral body lesion with surrounding marrow edema concerning for metastatic disease with a mild pathologic compression fracture. There is no intradiscal signal abnormality or enhancement. Cerebellar tonsils are normal in position.  There is prevertebral soft tissue edema with avid enhancement on post-contrast imaging extending from the level of C2 to the visualized upper thoracic spine.  C2-3: Bilateral uncovertebral degenerative  changes and facet arthropathy. No foraminal or central canal stenosis.  C3-4: Mild bilateral uncovertebral degenerative changes and facet arthropathy, left worse than right. Mild bilateral foraminal stenosis. No central canal stenosis. No significant disc protrusion.  C4-5: Mild right uncovertebral degenerative changes and mild bilateral facet arthropathy with mild right foraminal stenosis. Mild bilateral foraminal stenosis. Mild broad-based disc bulge. Mild central canal narrowing.  C5-6: Mild broad-based disc osteophyte complex. Right uncovertebral degenerative change and facet arthropathy resulting in severe right foraminal stenosis. Left uncovertebral degenerative change and facet arthropathy resulting in moderate left foraminal stenosis. Mild central canal stenosis.  C6-7: Eccentric right broad-based disc bulge with a left paracentral disc protrusion abutting the ventral cervical spinal cord. Bilateral uncovertebral degenerative change facet arthropathy resulting in moderate bilateral foraminal stenosis. Mild central canal stenosis.  C7-T1: Mild broad-based disc bulge with bilateral uncovertebral degenerative changes and mild bilateral foraminal stenosis.  IMPRESSION: 1. No evidence of discitis/osteomyelitis of the cervical spine. 2. Osseous lesions in the C2-T2 vertebral bodies most concerning for metastatic disease. There is mild anterior compression deformity of the T2 vertebral body with significant marrow edema surrounding the bone lesion suggesting a mild pathologic compression fracture. 3. There is prevertebral soft tissue edema with avid enhancement on post-contrast imaging extending from the level of C2 to the visualized upper thoracic spine. 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 4. Cervical spondylosis as described above.   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  EXAM: NUCLEAR MEDICINE PET SKULL BASE TO THIGH  TECHNIQUE: 12.1 mCi F-18 FDG was injected intravenously. Full-ring PET imaging was performed from the skull base to thigh after the radiotracer. CT data was  obtained and used for attenuation correction and anatomic localization.  FASTING BLOOD GLUCOSE:  Value: 108 mg/dl  COMPARISON:  04/24/2015.  FINDINGS: NECK  Improvement to resolution of left tongue base primary. Vague hyper metabolism remains measuring a S.U.V. max of 3.9. On the prior exam, this measured a S.U.V. max of 9.7.  Marked improvement in left-sided cervical nodal hypermetabolism. A left-sided jugulodigastric node measures 8 mm and a S.U.V. max of 2.8. On the prior exam, a nodal mass at this location measured a S.U.V. max of 13.4.  A right jugulodigastric node measures 7 mm and a S.U.V. max of 4.0 on image 37. This measured similar in size and was not hypermetabolic on the prior exam.  A right posterior triangle node measures 6 mm and a S.U.V. max of 2.1 on image 41. This node measured similar in size and was not hypermetabolic on the prior.  CHEST  Resolution of previously described hypermetabolic right lower lobe pulmonary nodule. Hypermetabolism corresponding to probable posterior right upper lobe nodularity. This measures a S.U.V. max of 3.3, including on image 31. No thoracic nodal hypermetabolism.  ABDOMEN/PELVIS  A porta hepatis node measures 1.0 cm and a S.U.V. max of 3.4 on 117. This node was similar in size and not hypermetabolic on the prior.  SKELETON  Improved osseous metastasis. Previously described T2 and Z76 hypermetabolic lesions are improved to resolved. There is multi focal primarily right-sided muscular and soft tissue activity which could relate to motion after radiopharmaceutical injection and/or trauma. New hypermetabolism at the site of the healing posterior right ninth rib  fracture on image 93 of series 4.  CT IMAGES PERFORMED FOR ATTENUATION CORRECTION  Mucosal thickening and fluid in the right maxillary sinus. Mild cardiomegaly with multivessel coronary artery atherosclerosis. Lower pole left renal collecting system calculus. Probable right nephrolithiasis as well. Increased density in the jejunal mesenteric fat. Example image 131. This may be slightly increased. Left-sided lumbar spine fixation. Interval sclerosis in the right-sided the T11 vertebral body secondary to healing of metastasis. Healing pathologic fracture at the fourth posterior left rib.  IMPRESSION: 1. Response to therapy of tongue base primary, left cervical nodal metastasis, and osseous metastasis. A resolved right lower lobe lung nodule was likely due to an isolated metastasis. 2. New right-sided cervical nodal hypermetabolism, suspicious for metastatic disease. 3. Posterior right upper lobe mild hypermetabolism, likely corresponding to minimal nodularity. Correlate with symptoms of infection or possible aspiration. 4. Isolated porta hepatis hypermetabolic node is indeterminate. Favored to be reactive. An atypical distribution of metastasis could look similar. 5. Incidental findings, including coronary artery disease and nephrolithiasis. 6. Increased density in the jejunal mesenteric fat could represent mesenteric adenitis/panniculitis.   Electronically Signed   By: Abigail Miyamoto M.D.   On: 08/03/2015 11:59   Ir Removal [employer] Access W/ Port W/o Fl Mod Sed  08/07/2015   CLINICAL DATA:  Port catheter placed by IR 05/09/2015.  Worked well.  Hx tongue cancer; need for chemotherapyPt noted fever; N/VAdmitted 08/04/15: sepsis: MRSA bacteremiaNow for PAC removal requested  EXAM: EXAM TUNNELED PORT CATHETER REMOVAL  TECHNIQUE: The procedure, risks (including but not limited to bleeding, infection, organ damage ), benefits, and alternatives were explained to the patient. Questions regarding the procedure were encouraged and  answered. The patient understands and consents to the procedure.  Intravenous Fentanyl and Versed were administered as conscious sedation during continuous cardiorespiratory monitoring by the radiology RN, with a total moderate sedation time of 11 minutes.  Overlying skin prepped with chlorhexidine, draped  in usual sterile fashion, infiltrated locally with 1% lidocaine. A small incision was made over the scar from previous placement. The port catheter was dissected free from the underlying soft tissues and removed intact. No purulence around the port pocket. Hemostasis was achieved. The port pocket was closed with deep interrupted and subcuticular continuous 3-0 Monocryl sutures, then covered with Dermabond. The patient tolerated the procedure well.  COMPLICATIONS: COMPLICATIONS None immediate  IMPRESSION: 1.  Technically successful tunneled Port catheter removal.   Electronically Signed   By: Lucrezia Europe M.D.   On: 08/07/2015 16:13   Dg Chest Port 1 View  08/04/2015   CLINICAL DATA:  Acute onset of shortness of breath. Initial encounter.  EXAM: PORTABLE CHEST - 1 VIEW  COMPARISON:  Chest radiograph performed 07/22/2015, and PET/CT performed 08/02/2015  FINDINGS: The lungs are well-aerated. Right basilar airspace opacity raises concern for pneumonia. Obscuration of the left hemidiaphragm may also reflect airspace opacity. No definite pleural effusion or pneumothorax is seen.  The cardiomediastinal silhouette is borderline normal in size. A right-sided chest port is seen ending about the distal SVC. No acute osseous abnormalities are seen.  IMPRESSION: Right basilar airspace opacity raises concern for pneumonia. Obscuration of the left hemidiaphragm may also reflect airspace opacity. Followup PA and lateral chest X-ray is recommended in 3-4 weeks following trial of antibiotic therapy to ensure resolution and exclude underlying new or recurrent malignancy.   Electronically Signed   By: Garald Balding M.D.   On:  08/04/2015 03:15    Microbiology: Recent Results (from the past 240 hour(s))  Blood Culture (routine x 2)     Status: None   Collection Time: 08/04/15  3:27 AM  Result Value Ref Range Status   Specimen Description BLOOD LEFT FOREARM  Final   Special Requests BOTTLES DRAWN AEROBIC AND ANAEROBIC 10CC  Final   Culture  Setup Time   Final    AEROBIC BOTTLE ONLY GRAM POSITIVE COCCI IN CLUSTERS CRITICAL RESULT CALLED TO, READ BACK BY AND VERIFIED WITH: [place] 2221 08/04/15 M.CAMPBELL CONFIRMED BY R.GREENE    Culture METHICILLIN RESISTANT STAPHYLOCOCCUS AUREUS  Final   Report Status 08/06/2015 FINAL  Final   Organism ID, Bacteria METHICILLIN RESISTANT STAPHYLOCOCCUS AUREUS  Final      Susceptibility   Methicillin resistant staphylococcus aureus - MIC*    CIPROFLOXACIN >=8 RESISTANT Resistant     ERYTHROMYCIN >=8 RESISTANT Resistant     GENTAMICIN <=0.5 SENSITIVE Sensitive     OXACILLIN >=4 RESISTANT Resistant     TETRACYCLINE <=1 SENSITIVE Sensitive     VANCOMYCIN <=0.5 SENSITIVE Sensitive     TRIMETH/SULFA <=10 SENSITIVE Sensitive     CLINDAMYCIN <=0.25 SENSITIVE Sensitive     RIFAMPIN <=0.5 SENSITIVE Sensitive     Inducible Clindamycin NEGATIVE Sensitive     * METHICILLIN RESISTANT STAPHYLOCOCCUS AUREUS  Blood Culture (routine x 2)     Status: None   Collection Time: 08/04/15  3:27 AM  Result Value Ref Range Status   Specimen Description BLOOD NECK  Final   Special Requests BOTTLES DRAWN AEROBIC AND ANAEROBIC 5CC  Final   Culture NO GROWTH 5 DAYS  Final   Report Status 08/09/2015 FINAL  Final  Urine culture     Status: None   Collection Time: 08/04/15  3:49 AM  Result Value Ref Range Status   Specimen Description URINE, CLEAN CATCH  Final   Special Requests NONE  Final   Culture 2,000 COLONIES/mL INSIGNIFICANT GROWTH  Final   Report Status  08/05/2015 FINAL  Final  MRSA PCR Screening     Status: Abnormal   Collection Time: 08/04/15 10:02 AM  Result Value Ref Range  Status   MRSA by PCR POSITIVE (A) NEGATIVE Final    Comment:        The GeneXpert MRSA Assay (FDA approved for NASAL specimens only), is one component of a comprehensive MRSA colonization surveillance program. It is not intended to diagnose MRSA infection nor to guide or monitor treatment for MRSA infections. RESULT CALLED TO, READ BACK BY AND VERIFIED WITH: NOTIFIED M. LESSARD RN 08/04/15 AT 1139 BY A. DAVIS   Culture, blood (routine x 2)     Status: None   Collection Time: 08/05/15 11:45 AM  Result Value Ref Range Status   Specimen Description BLOOD RIGHT ANTECUBITAL  Final   Special Requests BOTTLES DRAWN AEROBIC AND ANAEROBIC 10CC  Final   Culture NO GROWTH 5 DAYS  Final   Report Status 08/10/2015 FINAL  Final  Culture, blood (routine x 2)     Status: None   Collection Time: 08/05/15 12:00 PM  Result Value Ref Range Status   Specimen Description BLOOD BLOOD RIGHT HAND  Final   Special Requests BOTTLES DRAWN AEROBIC AND ANAEROBIC 10CC  Final   Culture NO GROWTH 5 DAYS  Final   Report Status 08/10/2015 FINAL  Final     Labs: Basic Metabolic Panel:  Recent Labs Lab 08/07/15 0300 08/09/15 0251 08/10/15 0248 08/11/15 0319 08/12/15 0244  NA 139 137 138 132* 134*  K 3.3* 4.0 4.3 4.0 3.5  CL 103 103 103 98* 99*  CO2 27 24 26 24 25   GLUCOSE 111* 117* 103* 128* 104*  BUN <5* 5* <5* 7 10  CREATININE 0.86 0.84 0.89 1.04 1.01  CALCIUM 8.8* 9.0 9.5 9.2 9.1  MG 1.3* 1.8 1.6* 2.2 1.6*   Liver Function Tests:  Recent Labs Lab 08/06/15 0519 08/07/15 0300 08/10/15 0248 08/11/15 0319 08/12/15 0244  AST 34 32 30 27 20   ALT 16* 16* 25 24 20   ALKPHOS 72 74 82 89 83  BILITOT 0.5 0.3 0.5 0.5 0.8  PROT 6.4* 6.1* 7.5 7.1 7.0  ALBUMIN 2.5* 2.6* 2.9* 3.1* 2.9*   No results for input(s): LIPASE, AMYLASE in the last 168 hours. No results for input(s): AMMONIA in the last 168 hours. CBC:  Recent Labs Lab 08/06/15 0516 08/07/15 0300 08/10/15 0248 08/11/15 0319  08/12/15 0244  WBC 5.9 5.0 4.8 5.0 5.6  NEUTROABS  --   --  3.1 3.2 3.5  HGB 7.9* 8.0* 9.7* 9.9* 9.6*  HCT 25.2* 25.3* 30.0* 29.5* 29.6*  MCV 92.6 91.3 92.6 90.2 92.2  PLT 264 335 534* 570* 519*   Cardiac Enzymes: No results for input(s): CKTOTAL, CKMB, CKMBINDEX, TROPONINI in the last 168 hours. BNP: BNP (last 3 results) No results for input(s): BNP in the last 8760 hours.  ProBNP (last 3 results) No results for input(s): PROBNP in the last 8760 hours.  CBG:  Recent Labs Lab 08/06/15 2146 08/07/15 0801 08/07/15 1313 08/07/15 1654 08/08/15 1148  GLUCAP 123* 144* 130* 127* 130*       Signed:  Dia Crawford, MD [facility] [phone] pager

## 2015-08-12 NOTE — Care Management Note (Signed)
Case Management Note  Patient Details  Name: SAHID BORBA MRN: [id number] Date of Birth: July 26, 1956  Subjective/Objective:                  Chief Complaint: Intractable cancer pain, n/v, poor oral intake, dehydration  Action/Plan: CM spoke to RN who states that [facility] orders have been entered. CM spoke to pt and advised that [facility] RN, Aide, and PT was ordered. CM offered choice for [facility] services and pt states that he has used [facility] for home care services before and would like to use them again. CM called [facility] and spoke with Tiffany who accepted referral for RN for IV Vanc, aide, and PT. Cm spoke to pt who states that he has a walker at home alone with 3N1 and has no other DME needs. Pt denies difficulty obtaining medications. Pt lives with daughter who is able to provide adequate support and supervision. CM awaiting MD to enter RX and will fax to [facility]. No further d/c or Cm needs communicated.   Expected Discharge Date:  08/12/15              Expected Discharge Plan:  [place]  In-House Referral:     Discharge planning Services  CM Consult  Post Acute Care Choice:  Home Health Choice offered to:     DME Arranged:    DME Agency:     HH Arranged:  RN, PT, Nurse's Aide [place] Agency:  [place]  Status of Service:  In process, will continue to follow  Medicare Important Message Given:  Yes-second notification given Date Medicare IM Given:    Medicare IM give by:    Date Additional Medicare IM Given:    Additional Medicare Important Message give by:     If discussed at [place] of Stay Meetings, dates discussed:    Additional Comments:  Guido Sander, RN 08/12/2015, 1:16 PM

## 2015-08-13 ENCOUNTER — Telehealth: Payer: Self-pay | Admitting: *Deleted

## 2015-08-13 ENCOUNTER — Telehealth: Payer: Self-pay | Admitting: Hematology and Oncology

## 2015-08-13 NOTE — Telephone Encounter (Signed)
lvm for pt regarding to Sept appt change per pof from rick

## 2015-08-13 NOTE — Telephone Encounter (Signed)
  Oncology Nurse Navigator Documentation    Navigator Encounter Type: Telephone (08/13/15 1211)         Interventions: Coordination of Care (08/13/15 1211)     Patient called to reschedule tomorrow's appts with Dr. Enrique Sack and [name].  I notified schedulers accordingly.  Gayleen Orem, RN, BSN, [place] at [place] [phone]           Time Spent with Patient: 15 (08/13/15 1211)

## 2015-08-14 ENCOUNTER — Telehealth: Payer: Self-pay | Admitting: Hematology and Oncology

## 2015-08-14 ENCOUNTER — Ambulatory Visit: Payer: Self-pay | Admitting: Hematology and Oncology

## 2015-08-14 ENCOUNTER — Encounter (HOSPITAL_COMMUNITY): Payer: Self-pay | Admitting: Dentistry

## 2015-08-14 NOTE — Telephone Encounter (Signed)
Returned call and s.w. Pt and pt decided to keep current appt.Marland KitchenMarland Kitchen

## 2015-08-17 ENCOUNTER — Telehealth: Payer: Self-pay | Admitting: *Deleted

## 2015-08-17 NOTE — Telephone Encounter (Signed)
  Oncology Nurse Navigator Documentation    Navigator Encounter Type: Telephone (08/17/15 1457)         Interventions: Coordination of Care (08/17/15 1457)     Returned VMM from Ethelene Hal, patient's sister.  She asked whether any medications he is taking might contribute to dehydration.  She stated he is not experiencing dizziness with standing.  I noted hydration is ongoing challenge, he should drink as much as possible.  She stated he is not having difficulty swallowing, would encourage him to do so.  She stated he had CPAP while in hospital, benefited from its use but was not give order for one at discharge.  I explained a sleep study likely needed before order for CPAP can be issued, she should call MD request referral for study if necessary.   She verbalized understanding of information provided.  Gayleen Orem, RN, BSN, [place] at [place] [phone]           Time Spent with Patient: 15 (08/17/15 1457)

## 2015-08-21 ENCOUNTER — Ambulatory Visit (HOSPITAL_COMMUNITY): Payer: Self-pay | Admitting: Dentistry

## 2015-08-21 ENCOUNTER — Encounter: Payer: Self-pay | Admitting: Hematology and Oncology

## 2015-08-21 ENCOUNTER — Telehealth: Payer: Self-pay | Admitting: Hematology and Oncology

## 2015-08-21 ENCOUNTER — Ambulatory Visit (HOSPITAL_BASED_OUTPATIENT_CLINIC_OR_DEPARTMENT_OTHER): Payer: PPO | Admitting: Hematology and Oncology

## 2015-08-21 ENCOUNTER — Encounter: Payer: Self-pay | Admitting: *Deleted

## 2015-08-21 ENCOUNTER — Encounter (HOSPITAL_COMMUNITY): Payer: Self-pay | Admitting: Dentistry

## 2015-08-21 VITALS — BP 131/85 | HR 114 | Temp 99.4°F

## 2015-08-21 VITALS — BP 137/86 | HR 106 | Temp 98.3°F | Resp 18 | Ht 69.0 in | Wt 214.9 lb

## 2015-08-21 DIAGNOSIS — K08109 Complete loss of teeth, unspecified cause, unspecified class: Secondary | ICD-10-CM

## 2015-08-21 DIAGNOSIS — C029 Malignant neoplasm of tongue, unspecified: Secondary | ICD-10-CM

## 2015-08-21 DIAGNOSIS — M542 Cervicalgia: Secondary | ICD-10-CM | POA: Diagnosis not present

## 2015-08-21 DIAGNOSIS — C01 Malignant neoplasm of base of tongue: Secondary | ICD-10-CM

## 2015-08-21 DIAGNOSIS — M264 Malocclusion, unspecified: Secondary | ICD-10-CM

## 2015-08-21 DIAGNOSIS — M549 Dorsalgia, unspecified: Secondary | ICD-10-CM | POA: Diagnosis not present

## 2015-08-21 DIAGNOSIS — B9562 Methicillin resistant Staphylococcus aureus infection as the cause of diseases classified elsewhere: Secondary | ICD-10-CM

## 2015-08-21 DIAGNOSIS — K0820 Unspecified atrophy of edentulous alveolar ridge: Secondary | ICD-10-CM

## 2015-08-21 DIAGNOSIS — R7881 Bacteremia: Secondary | ICD-10-CM

## 2015-08-21 DIAGNOSIS — K117 Disturbances of salivary secretion: Secondary | ICD-10-CM

## 2015-08-21 DIAGNOSIS — G8929 Other chronic pain: Secondary | ICD-10-CM

## 2015-08-21 DIAGNOSIS — Z463 Encounter for fitting and adjustment of dental prosthetic device: Secondary | ICD-10-CM

## 2015-08-21 DIAGNOSIS — R682 Dry mouth, unspecified: Secondary | ICD-10-CM

## 2015-08-21 NOTE — Progress Notes (Signed)
Pt would like to apply for the [employer].  Unfortunately at this time pt does not qualify for the grant because he's not in active treatment.  I have a copy of his SSI letter so he can apply for the grant in a couple of months if he begins treatment.  Pt verbalized understanding.

## 2015-08-21 NOTE — Assessment & Plan Note (Signed)
He had repeat MRI scan which showed no evidence of osteomyelitis. He has chronic degenerative joint disease. Previously, the higher dose of pain medicine caused risk of respiratory failure/overdose Currently, he is doing well on 10 mg methadone twice a day along with 4 mg of Dilaudid twice a day. We will continue the same.

## 2015-08-21 NOTE — Assessment & Plan Note (Signed)
He is prescribed IV vancomycin and he has been receiving it twice a day at home. I reinforced the importance of weekly blood work monitoring. Apparently home health care agency has been drawing labs and sent over to another hospital. It is not clear to me who has been following the blood work. I recommend the patient to call infectious disease office for follow-up appointment

## 2015-08-21 NOTE — Telephone Encounter (Signed)
Gave and printed appt sched and avs fo rpt; for NOV  °

## 2015-08-21 NOTE — Progress Notes (Signed)
  Oncology Nurse Navigator Documentation   Navigator Encounter Type: Clinic/MDC (08/21/15 1425) Patient Visit Type: Medonc (08/21/15 1425)     To provide support and encouragement, care continuity and to assess for needs, met with patient during est pt appt with [name].  He was accompanied by his dtrs [place] and [place]. He reported management of lower back pain with current regime of methadone 10 mg BID and dilaudid 4 mg BID. He verbalized understanding of Dr. Calton Dach guidance:  To keep accurate records of meds and frequency for future Rx adjustments.  To make appt with Dr. Johnnye Sima to f/u on current course of abx; confirm that Dr. Johnnye Sima is monitoring results of blood work.  That chemo will be restarted after current course of abx completed and demonstrated improvement, approximately 10/02/15  To increase mobility and build up stamina prior, gain weight, before next cycle of chemotherapy. He understands I can be contacted with needs/concerns.  Gayleen Orem, RN, BSN, [place] at [place] [phone]                      Time Spent with Patient: 30 (08/21/15 1425)

## 2015-08-21 NOTE — Progress Notes (Signed)
08/21/2015  Patient Name:   Shane Cantu Date of Birth:   Jul 29, 1956 Medical Record Number: [id number]  BP 131/85 mmHg  Pulse 114  Temp(Src) 99.4 F (37.4 C) (Oral)  Aaron Mose Costilow presents for insertion of upper and lower complete dentures.  Procedure: Pressure indicating paste was applied to the dentures. Adjustments were made as needed. [place] Occlusion evaluated and adjustments made as needed for Centric Relation and protrusive strokes. Patient has End to End and anterior occlusion with bilateral posterior crossbite. Good esthetics, phonetics, fit, and function noted. Patient accepts results. Post op instructions provided in written and verbal formats on use and care of dentures. Gave patient denture brush and cup. Patient to keep dentures out if sore spots develop. Use salt water rinses as needed to aid healing. Return to clinic as scheduled for denture adjustment.   Call if problems arise before then.  [name], DDS

## 2015-08-21 NOTE — Progress Notes (Signed)
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   04/13/2015 Initial Biopsy Accession [id number]:  Lymph node, needle/core biopsy - SCC, p16 positive.   04/18/2015 Initial Diagnosis Tongue cancer   04/24/2015 Imaging PET CT showed tongue cancer, lung nodule and possible bone mets   04/26/2015 Surgery He had dental extractions   05/03/2015 Imaging CT neck with contrast: L base of tongue with regional adenopathy, metastatic disease C2, C3, T2 vertebra; posterior L fourth rib.   05/09/2015 Procedure Port-a-cath placed.   05/11/2015 - 05/23/2015 Radiation Therapy He received palliative radiation therapy   06/01/2015 -  Chemotherapy He started cycle 1 of carboplatin, 5-FU and cetuximab   06/04/2015 - 06/05/2015 Hospital Admission He was admitted to the hospital after syncopal episode and had right nondisplaced fibular fracture   07/04/2015 Surgery PAC removed.   07/20/2015 - 07/24/2015 Hospital Admission he was admitted to the hospital for weakness and uncontrolled nausea and dehydration   08/02/2015 Imaging PET CT scan showed positive response to treatment   08/04/2015 Surgery PAC removed r/t sepsis, MRSA  bacteremia.   08/04/2015 - 08/12/2015 Hospital Admission He was admitted to the hospital with sepsis, MRSA bacteremia and respiratory failure    INTERVAL HISTORY: Please see below for problem oriented charting. He returns for further follow-up. He is doing well at home. His daughter administer twice a day IV antibiotics for him. Home health care  nurse had been drawing blood once a week. He denies recent fevers or chills. No cough. Denies shortness of breath. He is quite deconditioned. He has drastically reduced the amount of pain medicine and his chronic pain appears to be well controlled.  RE VIEW OF SYSTEMS:   Constitutional: Denies fevers, chills or abnormal weight loss Eyes: Denies blurriness of vision Ears, nose, mouth, throat, and face: Denies mucositis or sore throat Respiratory: Denies cough, dyspnea or wheezes Cardiovascular: Denies palpitation, chest discomfort or lower extremity swelling Gastrointestinal:  Denies nausea, heartburn or change in bowel habits Skin: Denies abnormal skin rashes Lymphatics: Denies new lymphadenopathy or easy bruising Neurological:Denies numbness, tingling or new weaknesses Behavioral/Psych: Mood is stable, no new changes  All other systems were reviewed with the patient and are negative.  I have reviewed the past medical history, past surgical history, social history and family history with the patient and they are unchanged from previous note.  ALLERGIES:  is allergic to morphine and related.  MEDICATIONS:  Current Outpatient Prescriptions  Medication Sig Dispense Refill  . Armodafinil 250 MG tablet Take 250 mg by mouth daily.    Marland Kitchen aspirin EC 81 MG tablet Take 81 mg by mouth daily.    . famotidine (PEPCID) 20 MG tablet Take 20 mg by mouth daily as needed (burning sensation).     . fluticasone (FLONASE) 50 MCG/ACT nasal spray Place 2 sprays into both nostrils daily. 16 g 0  . gabapentin (NEURONTIN) 100 MG capsule Take 600 mg by mouth 2 (two) times daily. Takes with supper and at bedtime    . hydrocortisone cream 0.5 % Apply topically 3 (three) times  daily. 30 g 0  . HYDROmorphone (DILAUDID) 4 MG tablet   0  . ibuprofen (ADVIL,MOTRIN) 200 MG tablet Take 600 mg by mouth every 6 (six) hours as needed for mild pain or moderate pain.     Marland Kitchen lansoprazole (PREVACID) 30 MG capsule Take 30 mg by  mouth daily at 12 noon.    . lidocaine-prilocaine (EMLA) cream Apply 1 application topically as needed. To Port a cath site one hour prior to needle stick 30 g 3  . Magnesium Oxide 400 MG CAPS Take 1 capsule (400 mg total) by mouth daily. 30 capsule 0  . methadone (DOLOPHINE) 10 MG tablet Take 2 tablets (20 mg total) by mouth every 8 (eight) hours. 30 tablet 0  . metoCLOPramide (REGLAN) 10 MG tablet Take 1 tablet (10 mg total) by mouth 4 (four) times daily. 90 tablet 3  . ondansetron (ZOFRAN) 8 MG tablet Take 1 tablet (8 mg total) by mouth every 8 (eight) hours as needed for nausea. 30 tablet 3  . polyethylene glycol powder (GLYCOLAX/MIRALAX) powder Take 17 g by mouth 2 (two) times daily. 255 g 0  . vancomycin (VANCOCIN) 1 GM/200ML SOLN Inject 200 mLs (1,000 mg total) into the vein every 12 (twelve) hours. (Patient taking differently: Inject 750 mg into the vein every 12 (twelve) hours. ) 17600 mL 0  . traZODone (DESYREL) 100 MG tablet Take 100 mg by mouth at bedtime.     No current facility-administered medications for this visit.    PHYSICAL EXAMINATION: ECOG PERFORMANCE STATUS: 2 - Symptomatic, <50% confined to bed  Filed Vitals:   08/21/15 1427  BP: 137/86  Pulse: 106  Temp: 98.3 F (36.8 C)  Resp: 18   Filed Weights   08/21/15 1427  Weight: 214 lb 14.4 oz (97.478 kg)    GENERAL:alert, no distress and comfortable SKIN: skin color, texture, turgor are normal, no rashes or significant lesions EYES: normal, Conjunctiva are pink and non-injected, sclera clear OROPHARYNX:no exudate, no erythema and lips, buccal mucosa, and tongue normal  Musculoskeletal:no cyanosis of digits and no clubbing  NEURO: alert & oriented x 3 with fluent speech, no focal motor/sensory deficits  LABORATORY DATA:  I have reviewed the data as listed    Component Value Date/Time   NA 134* 08/12/2015 0244   NA 139 08/03/2015 1034   K 3.5 08/12/2015 0244   K 4.5 08/03/2015 1034   CL 99* 08/12/2015 0244    CO2 25 08/12/2015 0244   CO2 29 08/03/2015 1034   GLUCOSE 104* 08/12/2015 0244   GLUCOSE 173* 08/03/2015 1034   BUN 10 08/12/2015 0244   BUN 10.4 08/03/2015 1034   CREATININE 1.01 08/12/2015 0244   CREATININE 1.2 08/03/2015 1034   CALCIUM 9.1 08/12/2015 0244   CALCIUM 9.3 08/03/2015 1034   PROT 7.0 08/12/2015 0244   PROT 7.1 08/03/2015 1034   ALBUMIN 2.9* 08/12/2015 0244   ALBUMIN 3.2* 08/03/2015 1034   AST 20 08/12/2015 0244   AST 17 08/03/2015 1034   ALT 20 08/12/2015 0244   ALT 15 08/03/2015 1034   ALKPHOS 83 08/12/2015 0244   ALKPHOS 110 08/03/2015 1034   BILITOT 0.8 08/12/2015 0244   BILITOT 0.40 08/03/2015 1034   GFRNONAA >60 08/12/2015 0244   GFRAA >60 08/12/2015 0244    No results found for: SPEP, UPEP  Lab Results  Component Value Date   WBC 5.6 08/12/2015   NEUTROABS 3.5 08/12/2015   HGB 9.6* 08/12/2015   HCT 29.6* 08/12/2015  MCV 92.2 08/12/2015   PLT 519* 08/12/2015      Chemistry      Component Value Date/Time   NA 134* 08/12/2015 0244   NA 139 08/03/2015 1034   K 3.5 08/12/2015 0244   K 4.5 08/03/2015 1034   CL 99* 08/12/2015 0244   CO2 25 08/12/2015 0244   CO2 29 08/03/2015 1034   BUN 10 08/12/2015 0244   BUN 10.4 08/03/2015 1034   CREATININE 1.01 08/12/2015 0244   CREATININE 1.2 08/03/2015 1034      Component Value Date/Time   CALCIUM 9.1 08/12/2015 0244   CALCIUM 9.3 08/03/2015 1034   ALKPHOS 83 08/12/2015 0244   ALKPHOS 110 08/03/2015 1034   AST 20 08/12/2015 0244   AST 17 08/03/2015 1034   ALT 20 08/12/2015 0244   ALT 15 08/03/2015 1034   BILITOT 0.8 08/12/2015 0244   BILITOT 0.40 08/03/2015 1034     ASSESSMENT & PLAN:  Tongue cancer Due to recent bacteremia, all treatment is placed on hold. His last PET CT scan show improved disease control. His last IV antibiotics therapy day would be on 10/02/2015. I will see him the week after to discuss plan of care.  Chronic neck and back pain He had repeat MRI scan which showed no  evidence of osteomyelitis. He has chronic degenerative joint disease. Previously, the higher dose of pain medicine caused risk of respiratory failure/overdose Currently, he is doing well on 10 mg methadone twice a day along with 4 mg of Dilaudid twice a day. We will continue the same.  MRSA bacteremia He is prescribed IV vancomycin and he has been receiving it twice a day at home. I reinforced the importance of weekly blood work monitoring. Apparently home health care agency has been drawing labs and sent over to another hospital. It is not clear to me who has been following the blood work. I recommend the patient to call infectious disease office for follow-up appointment   No orders of the defined types were placed in this encounter.   All questions were answered. The patient knows to call the clinic with any problems, questions or concerns. No barriers to learning was detected. I spent 20 minutes counseling the patient face to face. The total time spent in the appointment was 25 minutes and more than 50% was on counseling and review of test results     [facility], [place], MD 08/21/2015 3:03 PM

## 2015-08-21 NOTE — Patient Instructions (Signed)
Instructions for Denture Use and Care  Congratulations, you are on the [facility] to oral [facility]!  You have just received a new set of complete or partial dentures.  These prostheses will help to improve both your appearance and chewing ability.  These instructions will help you get adjusted to your dentures as well as care for them properly.  Please read these instructions carefully and completely as soon as you get home.  If you or your caregiver have any questions please notify the [facility] at [phone].  HOW YOUR DENTURES LOOK AND FEEL Soon after you begin wearing your dentures, you may feel that your dentures are too large or even loose.  As our mouth and facial muscles become accustomed to the dentures, these feelings will go away.  You also may feel that you are salivating more than you normally do.  This feeling should go away as you get used to having the dentures in your mouth.  You may bite your cheek or your tongue; this will eventually resolve itself as you wear your dentures.  Some soreness is to be expected, but you should not hurt.  If your mouth hurts, call your dentist.  A denture adhesive may occasionally be necessary to hold your dentures in place more securely.  The dentist will let you know when one is recommended for you.  SPEAKING Wearing dentures will change the sound of your voice initially.  This will be noticed by you more than anyone else.  Bite and swallow before you speak, in order to place your dentures in position so that you may speak more clearly.  Practice speaking by reading aloud or counting from 1 to 100 very slowly and distinctly.  After some practice your mouth will become accustomed to your dentures and you will speak more clearly.  EATING Chewing will definitely be different after you receive your dentures.  With a little practice and patience you should be able to eat just about any kind of food.  Begin by eating small quantities of food  that are cut into small pieces.  Star with soft foods such as eggs, cooked vegetables, or puddings.  As you gain confidence advance  Your diet to whatever texture foods you can tolerate.  DENTURE CARE Dentures can collect plaque and calculus much the same as natural teeth can.  If not removed on a regular basis, your dentures will not look or feel clean, and you will experience denture odor.  It is very important that you remove your dentures at bedtime and clean them thoroughly.  You should: 1. Clean your dentures over a sink full of water so if dropped, breakage will be prevented. 2. Rinse your dentures with cool water to remove any large food particles. 3. Use soap and water or a denture cleanser or paste to clean the dentures.  Do not use regular toothpaste as it may abrade the denture base or teeth. 4. Use a moistened denture brush to clean all surfaces (inside and outside). 5. Rinse thoroughly to remove any remaining soap or denture cleanser. 6. Use a soft bristle toothbrush to gently brush any natural teeth, gums, tongue, and palate at bedtime and before reinserting your dentures. 7. Do not sleep with your dentures in your mouth at night.  Remove your dentures and soak them overnight in a denture cup filled with water or denture solution as recommended by your dentist.  This routine will become second nature and will increase the life and comfort   of your dentures.  Please do not try to adjust these dentures yourself; you could damage them.  FOLLOW-UP You should call or make an appointment with your dentist.  Your dentist would like to see you at least once a year for a check-up and examination. 

## 2015-08-21 NOTE — Assessment & Plan Note (Signed)
Due to recent bacteremia, all treatment is placed on hold. His last PET CT scan show improved disease control. His last IV antibiotics therapy day would be on 10/02/2015. I will see him the week after to discuss plan of care.

## 2015-08-27 ENCOUNTER — Ambulatory Visit (HOSPITAL_COMMUNITY): Payer: Self-pay | Admitting: Dentistry

## 2015-08-27 ENCOUNTER — Encounter (HOSPITAL_COMMUNITY): Payer: Self-pay | Admitting: Dentistry

## 2015-08-27 VITALS — BP 151/93 | HR 72 | Temp 97.6°F

## 2015-08-27 DIAGNOSIS — Z463 Encounter for fitting and adjustment of dental prosthetic device: Secondary | ICD-10-CM

## 2015-08-27 DIAGNOSIS — K0820 Unspecified atrophy of edentulous alveolar ridge: Secondary | ICD-10-CM

## 2015-08-27 DIAGNOSIS — M264 Malocclusion, unspecified: Secondary | ICD-10-CM

## 2015-08-27 DIAGNOSIS — R682 Dry mouth, unspecified: Secondary | ICD-10-CM

## 2015-08-27 DIAGNOSIS — K08109 Complete loss of teeth, unspecified cause, unspecified class: Secondary | ICD-10-CM

## 2015-08-27 DIAGNOSIS — K117 Disturbances of salivary secretion: Secondary | ICD-10-CM

## 2015-08-27 DIAGNOSIS — C01 Malignant neoplasm of base of tongue: Secondary | ICD-10-CM

## 2015-08-27 NOTE — Progress Notes (Signed)
08/27/2015  Patient Name:   Shane Cantu Date of Birth:   1956/02/22 Medical Record Number: [id number]  BP 151/93 mmHg  Pulse 72  Temp(Src) 97.6 F (36.4 C) (Oral)   Aaron Mose Bergh presents for evaluation of recently inserted upper and lower complete dentures. SUBJECTIVE: Patient is complaining of some irritation to the lower left and lower right lingual aspects. OBJECTIVE: There is some slight erythema involving the lower left and lower right buccal extensions. No ulcerations are noted. Procedure: Pressure indicating paste was applied to the dentures. Adjustments were made as needed. [place] Thick PIP was applied to denture borders and adjustments made as needed. [place]). Occlusion evaluated and adjustments made as needed for Centric Relation and protrusive strokes. Patient has End to End and anterior occlusion with bilateral posterior crossbite. Patient accepts results. Patient to keep dentures out if sore spots develop. Use salt water rinses as needed to aid healing. Return to clinic as scheduled for denture adjustment.   Call if problems arise before then.  [name], DDS

## 2015-08-27 NOTE — Patient Instructions (Signed)
Patient to keep dentures out if sore spots develop. Use salt water rinses as needed to aid healing. Return to clinic as scheduled for denture adjustment.   Call if problems arise before then.  Ronald F. Kulinski, DDS  

## 2015-08-31 NOTE — Patient Outreach (Signed)
[place]) Care Management  08/31/2015  Bernd Crom [facility] 07/10/1956 [id number]   Referral from [place], assigned to [name], RN for patient outreach.  Damita L. Rhodie, [place] Care Management Assistant

## 2015-09-06 ENCOUNTER — Encounter (HOSPITAL_COMMUNITY): Payer: Self-pay | Admitting: Dentistry

## 2015-09-06 ENCOUNTER — Ambulatory Visit (HOSPITAL_COMMUNITY): Payer: Self-pay | Admitting: Dentistry

## 2015-09-06 ENCOUNTER — Encounter: Payer: Self-pay | Admitting: Hematology and Oncology

## 2015-09-06 ENCOUNTER — Telehealth: Payer: Self-pay | Admitting: *Deleted

## 2015-09-06 ENCOUNTER — Other Ambulatory Visit: Payer: Self-pay

## 2015-09-06 ENCOUNTER — Ambulatory Visit (HOSPITAL_BASED_OUTPATIENT_CLINIC_OR_DEPARTMENT_OTHER): Payer: PPO | Admitting: Hematology and Oncology

## 2015-09-06 ENCOUNTER — Telehealth: Payer: Self-pay | Admitting: Hematology and Oncology

## 2015-09-06 VITALS — BP 143/87 | HR 85 | Temp 98.2°F | Resp 19 | Ht 69.0 in | Wt 218.8 lb

## 2015-09-06 VITALS — BP 140/64 | HR 80 | Temp 98.1°F

## 2015-09-06 DIAGNOSIS — M264 Malocclusion, unspecified: Secondary | ICD-10-CM

## 2015-09-06 DIAGNOSIS — K1231 Oral mucositis (ulcerative) due to antineoplastic therapy: Secondary | ICD-10-CM

## 2015-09-06 DIAGNOSIS — Z463 Encounter for fitting and adjustment of dental prosthetic device: Secondary | ICD-10-CM

## 2015-09-06 DIAGNOSIS — G8929 Other chronic pain: Secondary | ICD-10-CM

## 2015-09-06 DIAGNOSIS — C01 Malignant neoplasm of base of tongue: Secondary | ICD-10-CM

## 2015-09-06 DIAGNOSIS — C029 Malignant neoplasm of tongue, unspecified: Secondary | ICD-10-CM

## 2015-09-06 DIAGNOSIS — K117 Disturbances of salivary secretion: Secondary | ICD-10-CM

## 2015-09-06 DIAGNOSIS — M542 Cervicalgia: Secondary | ICD-10-CM | POA: Diagnosis not present

## 2015-09-06 DIAGNOSIS — K08109 Complete loss of teeth, unspecified cause, unspecified class: Secondary | ICD-10-CM

## 2015-09-06 DIAGNOSIS — K0820 Unspecified atrophy of edentulous alveolar ridge: Secondary | ICD-10-CM

## 2015-09-06 DIAGNOSIS — J029 Acute pharyngitis, unspecified: Secondary | ICD-10-CM

## 2015-09-06 DIAGNOSIS — R682 Dry mouth, unspecified: Secondary | ICD-10-CM

## 2015-09-06 DIAGNOSIS — J028 Acute pharyngitis due to other specified organisms: Secondary | ICD-10-CM

## 2015-09-06 HISTORY — DX: Acute pharyngitis, unspecified: J02.9

## 2015-09-06 MED ORDER — AMOXICILLIN-POT CLAVULANATE 875-125 MG PO TABS: 1.0000 | ORAL_TABLET | Freq: Two times a day (BID) | ORAL | Status: DC

## 2015-09-06 NOTE — Telephone Encounter (Signed)
lvm fo rpt regarding to todays appt... °

## 2015-09-06 NOTE — Telephone Encounter (Signed)
Ok. I will put in POF

## 2015-09-06 NOTE — Telephone Encounter (Signed)
He has chronic pain issue, recent OD I will see him as add on today

## 2015-09-06 NOTE — Patient Outreach (Signed)
[facility] [facility]) Care Management  09/06/2015  Shane Cantu [facility] Apr 13, 1956 [id number]  Telephone call to patient regarding [place] referral. Unable to reach patient. HIPAA compliant voice message left with call back phone number.   PLAN: RNCM will attempt 2nd telephone outreach call to patient within  3 business days.   [name] RN,BSN,CCM [place] Coordinator [phone]

## 2015-09-06 NOTE — Progress Notes (Signed)
09/06/2015  Patient Name:   Daivion Pape [place] Date of Birth:   1956/11/16 Medical Record Number: [id number]  BP 140/64 mmHg  Pulse 80  Temp(Src) 98.1 F (36.7 C) (Oral)   Aaron Mose Lacap presents for evaluation of recently inserted upper and lower complete dentures. SUBJECTIVE: Patient is complaining of some irritation to the lower left lingual aspect. Patient also is complaining of some left submandibular lymphadenopathy that is tender to palpation. [name] has recently prescribed Augmentin therapy for this lymphadenopathy and acute pharyngitis.  OBJECTIVE: There is no evidence of denture irritation or ulceration.   Procedure: Pressure indicating paste was applied to the dentures. Adjustments were made as needed. [place] Thick PIP was applied to denture borders and adjustments made as needed. [place] Occlusion evaluated and adjustments made as needed for Centric Relation and protrusive strokes. Patient has End to End and anterior occlusion with bilateral posterior crossbite. Patient accepts results. Patient to keep dentures out if sore spots develop. Use salt water rinses as needed to aid healing. Return to clinic as scheduled for denture adjustment.   Call if problems arise before then.  [name], DDS

## 2015-09-06 NOTE — Telephone Encounter (Signed)
Patient called stating that he has developed left sided neck pain (6/10). Patient states this has been going on for a couple of days with no relief from pain medication. Patient denies any other symptoms and would like to see the physician or NP. Suggestions? Message sent to MD Alvy Bimler and [place].

## 2015-09-06 NOTE — Patient Instructions (Signed)
Patient to keep dentures out if sore spots develop. Use salt water rinses as needed to aid healing. Return to clinic as scheduled for denture adjustment.   Call if problems arise before then.  Latron Ribas F. Lajuana Patchell, DDS  

## 2015-09-07 ENCOUNTER — Encounter: Payer: Self-pay | Admitting: Hematology and Oncology

## 2015-09-07 NOTE — Assessment & Plan Note (Signed)
Due to recent bacteremia, all treatment is placed on hold. His last PET CT scan show improved disease control. His last IV antibiotics therapy day would be around 10/02/2015. I will see him the week after to discuss plan of care He has palpable lymphadenopathy in the left submandibular region. The cause is unknown, but could be related to recent mouth infection. I will give him a course of Augmentin and will reassess when I see him back.

## 2015-09-07 NOTE — Progress Notes (Signed)
[place] OFFICE PROGRESS NOTE  Patient Care Team: [name], MD as PCP - General (Family Medicine) Leota Sauers, RN as Oncology Nurse Navigator [name], MD as Consulting Physician (Hematology and Oncology) Eppie Gibson, MD as Attending Physician (Radiation Oncology) Karie Mainland, RD as Dietitian (Nutrition) Dannielle Karvonen, RN as [place] Management  SUMMARY OF ONCOLOGIC HISTORY:   Tongue cancer ([place])   04/10/2015 Imaging CT Neck with contrast:  L base of tongue SCC with regional adenopathy; suspected metastatic disease to C2, T2.   04/13/2015 Initial Biopsy Accession [id number]:  Lymph node, needle/core biopsy - SCC, p16 positive.   04/18/2015 Initial Diagnosis Tongue cancer   04/24/2015 Imaging PET CT showed tongue cancer, lung nodule and possible bone mets   04/26/2015 Surgery He had dental extractions   05/03/2015 Imaging CT neck with contrast: L base of tongue with regional adenopathy, metastatic disease C2, C3, T2 vertebra; posterior L fourth rib.   05/09/2015 Procedure Port-a-cath placed.   05/11/2015 - 05/23/2015 Radiation Therapy Palliative radiation therapy:  1) T1-T3 and Left 3rd posterior rib / 30 Gy in 10 fractions, 2) Base of tongue, neck, and Cspine / 30 Gy in 10 fractions.   06/01/2015 -  Chemotherapy He started cycle 1 of carboplatin, 5-FU and cetuximab   06/04/2015 - 06/05/2015 Hospital Admission He was admitted to the hospital after syncopal episode and had right nondisplaced fibular fracture   07/20/2015 - 07/24/2015 Hospital Admission he was admitted to the hospital for weakness and uncontrolled nausea and dehydration   08/02/2015 Imaging PET CT scan showed positive response to treatment   08/04/2015 - 08/12/2015 Hospital Admission He was admitted to the hospital with sepsis, MRSA bacteremia and respiratory failure   08/07/2015 Surgery PAC removed r/t sepsis, MRSA  bacteremia.    INTERVAL HISTORY: Please see below for problem oriented  charting. He is seen urgently because of uncontrollable neck pain. He also complained of poor fitted dentures and has severe mild pain recently. He is due to see dentist. He noticed some new lymphadenopathy on the left side of his neck.  REVIEW OF SYSTEMS:   Constitutional: Denies fevers, chills or abnormal weight loss Eyes: Denies blurriness of vision Respiratory: Denies cough, dyspnea or wheezes Cardiovascular: Denies palpitation, chest discomfort or lower extremity swelling Gastrointestinal:  Denies nausea, heartburn or change in bowel habits Skin: Denies abnormal skin rashes Lymphatics: Denies new lymphadenopathy or easy bruising Neurological:Denies numbness, tingling or new weaknesses Behavioral/Psych: Mood is stable, no new changes  All other systems were reviewed with the patient and are negative.  I have reviewed the past medical history, past surgical history, social history and family history with the patient and they are unchanged from previous note.  ALLERGIES:  is allergic to morphine and related.  MEDICATIONS:  Current Outpatient Prescriptions  Medication Sig Dispense Refill  . Armodafinil 250 MG tablet Take 250 mg by mouth daily.    Marland Kitchen aspirin EC 81 MG tablet Take 81 mg by mouth daily.    . famotidine (PEPCID) 20 MG tablet Take 20 mg by mouth daily as needed (burning sensation).     . fluticasone (FLONASE) 50 MCG/ACT nasal spray Place 2 sprays into both nostrils daily. 16 g 0  . gabapentin (NEURONTIN) 100 MG capsule Take 600 mg by mouth 2 (two) times daily. Takes with supper and at bedtime    . hydrocortisone cream 0.5 % Apply topically 3 (three) times daily. 30 g 0  . HYDROmorphone (DILAUDID)  4 MG tablet   0  . ibuprofen (ADVIL,MOTRIN) 200 MG tablet Take 600 mg by mouth every 6 (six) hours as needed for mild pain or moderate pain.     Marland Kitchen lansoprazole (PREVACID) 30 MG capsule Take 30 mg by mouth daily at 12 noon.    . lidocaine-prilocaine (EMLA) cream Apply 1 application  topically as needed. To Port a cath site one hour prior to needle stick 30 g 3  . Magnesium Oxide 400 MG CAPS Take 1 capsule (400 mg total) by mouth daily. 30 capsule 0  . methadone (DOLOPHINE) 10 MG tablet Take 2 tablets (20 mg total) by mouth every 8 (eight) hours. 30 tablet 0  . metoCLOPramide (REGLAN) 10 MG tablet Take 1 tablet (10 mg total) by mouth 4 (four) times daily. 90 tablet 3  . ondansetron (ZOFRAN) 8 MG tablet Take 1 tablet (8 mg total) by mouth every 8 (eight) hours as needed for nausea. 30 tablet 3  . polyethylene glycol powder (GLYCOLAX/MIRALAX) powder Take 17 g by mouth 2 (two) times daily. 255 g 0  . vancomycin (VANCOCIN) 1 GM/200ML SOLN Inject 200 mLs (1,000 mg total) into the vein every 12 (twelve) hours. (Patient taking differently: Inject 750 mg into the vein every 12 (twelve) hours. ) 17600 mL 0  . amoxicillin-clavulanate (AUGMENTIN) 875-125 MG tablet Take 1 tablet by mouth 2 (two) times daily. 14 tablet 0   No current facility-administered medications for this visit.    PHYSICAL EXAMINATION: ECOG PERFORMANCE STATUS: 0 - Asymptomatic  Filed Vitals:   09/06/15 1156  BP: 143/87  Pulse: 85  Temp: 98.2 F (36.8 C)  Resp: 19   Filed Weights   09/06/15 1156  Weight: 218 lb 12.8 oz (99.247 kg)    GENERAL:alert, no distress and comfortable SKIN: skin color, texture, turgor are normal, no rashes or significant lesions EYES: normal, Conjunctiva are pink and non-injected, sclera clear OROPHARYNX:no exudate, no erythema and lips, buccal mucosa, and tongue normal  NECK: supple, thyroid normal size, non-tender, without nodularity LYMPH:  He has new lymphadenopathy in the submandibular region.  LUNGS: clear to auscultation and percussion with normal breathing effort HEART: regular rate & rhythm and no murmurs and no lower extremity edema ABDOMEN:abdomen soft, non-tender and normal bowel sounds Musculoskeletal:no cyanosis of digits and no clubbing  NEURO: alert & oriented  x 3 with fluent speech, no focal motor/sensory deficits  LABORATORY DATA:  I have reviewed the data as listed    Component Value Date/Time   NA 134* 08/12/2015 0244   NA 139 08/03/2015 1034   K 3.5 08/12/2015 0244   K 4.5 08/03/2015 1034   CL 99* 08/12/2015 0244   CO2 25 08/12/2015 0244   CO2 29 08/03/2015 1034   GLUCOSE 104* 08/12/2015 0244   GLUCOSE 173* 08/03/2015 1034   BUN 10 08/12/2015 0244   BUN 10.4 08/03/2015 1034   CREATININE 1.01 08/12/2015 0244   CREATININE 1.2 08/03/2015 1034   CALCIUM 9.1 08/12/2015 0244   CALCIUM 9.3 08/03/2015 1034   PROT 7.0 08/12/2015 0244   PROT 7.1 08/03/2015 1034   ALBUMIN 2.9* 08/12/2015 0244   ALBUMIN 3.2* 08/03/2015 1034   AST 20 08/12/2015 0244   AST 17 08/03/2015 1034   ALT 20 08/12/2015 0244   ALT 15 08/03/2015 1034   ALKPHOS 83 08/12/2015 0244   ALKPHOS 110 08/03/2015 1034   BILITOT 0.8 08/12/2015 0244   BILITOT 0.40 08/03/2015 1034   GFRNONAA >60 08/12/2015 0244   GFRAA >60  08/12/2015 0244    No results found for: SPEP, UPEP  Lab Results  Component Value Date   WBC 5.6 08/12/2015   NEUTROABS 3.5 08/12/2015   HGB 9.6* 08/12/2015   HCT 29.6* 08/12/2015   MCV 92.2 08/12/2015   PLT 519* 08/12/2015      Chemistry      Component Value Date/Time   NA 134* 08/12/2015 0244   NA 139 08/03/2015 1034   K 3.5 08/12/2015 0244   K 4.5 08/03/2015 1034   CL 99* 08/12/2015 0244   CO2 25 08/12/2015 0244   CO2 29 08/03/2015 1034   BUN 10 08/12/2015 0244   BUN 10.4 08/03/2015 1034   CREATININE 1.01 08/12/2015 0244   CREATININE 1.2 08/03/2015 1034      Component Value Date/Time   CALCIUM 9.1 08/12/2015 0244   CALCIUM 9.3 08/03/2015 1034   ALKPHOS 83 08/12/2015 0244   ALKPHOS 110 08/03/2015 1034   AST 20 08/12/2015 0244   AST 17 08/03/2015 1034   ALT 20 08/12/2015 0244   ALT 15 08/03/2015 1034   BILITOT 0.8 08/12/2015 0244   BILITOT 0.40 08/03/2015 1034       ASSESSMENT & PLAN:  Tongue cancer Due to recent  bacteremia, all treatment is placed on hold. His last PET CT scan show improved disease control. His last IV antibiotics therapy day would be around 10/02/2015. I will see him the week after to discuss plan of care He has palpable lymphadenopathy in the left submandibular region. The cause is unknown, but could be related to recent mouth infection. I will give him a course of Augmentin and will reassess when I see him back.  Mucositis due to chemotherapy He complained of persistent mild pain related to prior treatment area pain related to poor fitting dentures. I discussed this with the dentist. It is unclear whether the lymphadenopathy could be related to some mild infection and will proceed with Augmentin as above  Chronic neck pain He has chronic neck pain. Again, he stated his pain is poorly controlled. In the past, when I increased the dose of his pain medicine, the patient had overdosed. I recommend he increase methadone to half a tablet 3 times a day for the next 2 weeks and reassess. He can continue to take Dilaudid as needed   No orders of the defined types were placed in this encounter.   All questions were answered. The patient knows to call the clinic with any problems, questions or concerns. No barriers to learning was detected. I spent 15 minutes counseling the patient face to face. The total time spent in the appointment was 20 minutes and more than 50% was on counseling and review of test results     [facility], [place], MD 09/07/2015 9:07 AM

## 2015-09-07 NOTE — Assessment & Plan Note (Signed)
He has chronic neck pain. Again, he stated his pain is poorly controlled. In the past, when I increased the dose of his pain medicine, the patient had overdosed. I recommend he increase methadone to half a tablet 3 times a day for the next 2 weeks and reassess. He can continue to take Dilaudid as needed

## 2015-09-07 NOTE — Assessment & Plan Note (Signed)
He complained of persistent mild pain related to prior treatment area pain related to poor fitting dentures. I discussed this with the dentist. It is unclear whether the lymphadenopathy could be related to some mild infection and will proceed with Augmentin as above

## 2015-09-10 ENCOUNTER — Other Ambulatory Visit: Payer: Self-pay

## 2015-09-10 ENCOUNTER — Encounter (HOSPITAL_COMMUNITY): Payer: Self-pay | Admitting: Dentistry

## 2015-09-10 NOTE — Patient Outreach (Signed)
[facility]) Care Management  09/10/2015  Shane Cantu [facility] Jun 23, 1956 [id number]  Second telephone outreach attempt to patient regarding [facility] referral.  Unable to reach.  HIPAA compliant voice message left with call back phone number.   PLAN; RNCM will attempt 3rd telephone outreach attempt to patient within 3 business days.   [name] RN,BSN,CCM [place] Coordinator [phone]

## 2015-09-12 ENCOUNTER — Other Ambulatory Visit: Payer: Self-pay

## 2015-09-12 ENCOUNTER — Ambulatory Visit: Payer: Self-pay

## 2015-09-12 ENCOUNTER — Telehealth: Payer: Self-pay | Admitting: *Deleted

## 2015-09-12 NOTE — Telephone Encounter (Signed)
[place] at [facility] called to advise that the patient labs from 09/10/15 his Creat is 1.3 up from 1.1 09/03/15 his vanc trough os 14.8 and BUN 19. His vanc dose os 750 mg q12h and stop date is 09/24/15 he just wanted someone to know just in case it continues to creep up. Advised will document and let the doctor know. He will fax the labs as well.

## 2015-09-12 NOTE — Patient Outreach (Signed)
[facility]) Care Management  09/12/2015  Shane Cantu [facility] [birth year] [id number]   Subjective:  Telephone call from patient.  HIPAA verified.   Discussed and offered [facility] Care Management services.   Patient declined services at this time.   Patient states he has a nurse that sees him once a week.   Patient states he is getting ready enrolled in to another cancer treatment program and will have services available to him at that time.  Patient verbally agreed to receive [place] Management Outreach letter and brochure.    Assesment: [facility] referral.   Plan: RNCM will refer patient to [place] to close due to refusal of services.    RNCM will notify patient's primary MD of refusal of services.  RNCM will send patient [place] Management Outreach letter and brochure.  [name] RN,BSN,CCM [place] Coordinator [phone]

## 2015-09-13 NOTE — Patient Outreach (Signed)
[facility]) Care Management  09/13/2015  Shane Cantu [facility] 04-11-1956 [id number]   Notification received from [name], RN to close case due to patient refusing services.  [name], [place] Care Management Assistant

## 2015-09-17 ENCOUNTER — Telehealth: Payer: Self-pay | Admitting: *Deleted

## 2015-09-17 NOTE — Telephone Encounter (Signed)
  Oncology Nurse Navigator Documentation   Navigator Encounter Type: Telephone (09/17/15 1558) Patient Visit Type: Follow-up (09/17/15 1558)     Called patient to check on his wellbeing, particularly pain control since 10/7 appt with [name]. He reported:  "Doing real well".  Pain controlled with current regime of 1/2 tab methadone TID; he has not needed to take dilaudid.  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  I noted I would arrange PT follow-up with Serafina Royals. I confirmed his understanding of upcoming appts, including 11/7 labs and f/u with [name]. He did not express any needs or additional concerns at this time, I encouraged him to contact me if that changes, he verbalized understanding.  Gayleen Orem, RN, BSN, [place] [facility] [place] [phone]                    Time Spent with Patient: 15 (09/17/15 1558)

## 2015-09-20 ENCOUNTER — Encounter: Payer: Self-pay | Admitting: *Deleted

## 2015-09-20 DIAGNOSIS — I890 Lymphedema, not elsewhere classified: Secondary | ICD-10-CM

## 2015-09-20 DIAGNOSIS — C01 Malignant neoplasm of base of tongue: Secondary | ICD-10-CM

## 2015-09-21 NOTE — Progress Notes (Signed)
A user error has taken place: orders placed in error, not carried out on this patient.    This encounter was created in error - please disregard. This encounter was created in error - please disregard.

## 2015-09-21 NOTE — Progress Notes (Signed)
A user error has taken place: orders placed in error, not carried out on this patient.  This encounter was created in error - please disregard.

## 2015-09-21 NOTE — Progress Notes (Deleted)
A user error has taken place: {error:315308}. ° °This encounter was created in error - please disregard. °

## 2015-09-24 ENCOUNTER — Telehealth: Payer: Self-pay | Admitting: *Deleted

## 2015-09-24 ENCOUNTER — Encounter (HOSPITAL_COMMUNITY): Payer: Self-pay | Admitting: Dentistry

## 2015-09-24 NOTE — Telephone Encounter (Signed)
Order given to [facility] to pull PICC per Dr Johnnye Sima.

## 2015-09-24 NOTE — Telephone Encounter (Signed)
Patient and home health nurse called to advise that he is having his last dose of medication today and they want to know if the patient needs more medication or if the PICC should be maintained until his appt 10/01/15. Advised will ask the doctor and call them back.

## 2015-09-24 NOTE — Telephone Encounter (Signed)
Please pull PIC thanks 

## 2015-10-01 ENCOUNTER — Ambulatory Visit (INDEPENDENT_AMBULATORY_CARE_PROVIDER_SITE_OTHER): Payer: PPO | Admitting: Infectious Diseases

## 2015-10-01 ENCOUNTER — Encounter (HOSPITAL_COMMUNITY): Payer: Self-pay | Admitting: Dentistry

## 2015-10-01 ENCOUNTER — Encounter: Payer: Self-pay | Admitting: Infectious Diseases

## 2015-10-01 ENCOUNTER — Ambulatory Visit (HOSPITAL_COMMUNITY): Payer: Self-pay | Admitting: Dentistry

## 2015-10-01 VITALS — BP 148/75 | HR 72 | Temp 97.7°F

## 2015-10-01 VITALS — BP 154/90 | HR 74 | Temp 97.7°F | Ht 69.0 in | Wt 220.0 lb

## 2015-10-01 DIAGNOSIS — K117 Disturbances of salivary secretion: Secondary | ICD-10-CM

## 2015-10-01 DIAGNOSIS — C01 Malignant neoplasm of base of tongue: Secondary | ICD-10-CM

## 2015-10-01 DIAGNOSIS — K0820 Unspecified atrophy of edentulous alveolar ridge: Secondary | ICD-10-CM

## 2015-10-01 DIAGNOSIS — R682 Dry mouth, unspecified: Secondary | ICD-10-CM

## 2015-10-01 DIAGNOSIS — K08109 Complete loss of teeth, unspecified cause, unspecified class: Secondary | ICD-10-CM

## 2015-10-01 DIAGNOSIS — A4101 Sepsis due to Methicillin susceptible Staphylococcus aureus: Secondary | ICD-10-CM | POA: Diagnosis not present

## 2015-10-01 DIAGNOSIS — Z463 Encounter for fitting and adjustment of dental prosthetic device: Secondary | ICD-10-CM

## 2015-10-01 DIAGNOSIS — C029 Malignant neoplasm of tongue, unspecified: Secondary | ICD-10-CM

## 2015-10-01 DIAGNOSIS — M264 Malocclusion, unspecified: Secondary | ICD-10-CM

## 2015-10-01 LAB — BASIC METABOLIC PANEL
BUN: 23 mg/dL (ref 7–25)
CHLORIDE: 104 mmol/L (ref 98–110)
CO2: 25 mmol/L (ref 20–31)
Calcium: 9.2 mg/dL (ref 8.6–10.3)
Creat: 1.22 mg/dL (ref 0.70–1.25)
Glucose, Bld: 121 mg/dL — ABNORMAL HIGH (ref 65–99)
POTASSIUM: 4.3 mmol/L (ref 3.5–5.3)
SODIUM: 139 mmol/L (ref 135–146)

## 2015-10-01 NOTE — Patient Instructions (Signed)
Keep dentures out if sore spots arise. Use salt water rinses as needed to aid healing. Return to clinic as scheduled or call if problems arise before then. Dr. Aalaiyah Yassin 

## 2015-10-01 NOTE — Assessment & Plan Note (Signed)
Will f/u with Dr Elson Areas. He will ask her about getting flu shot, he defers this today.

## 2015-10-01 NOTE — Assessment & Plan Note (Addendum)
He is doing very well.  Will recheck his BCx today.  There is some concern that his Cr has increased while he was on vanco. This may be due ot his use of NSAIDS as well (which I cautioned him about).  Will recheck his Cr today.  Will see him back prn.

## 2015-10-01 NOTE — Progress Notes (Signed)
   Subjective:    Patient ID: Shane Cantu, male    DOB: [birth year], 59 y.o.   MRN: [id number]  HPI 59 y.o. 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  He came to the hospital 9-3 with shortness of breath was found to be febrile and tachycardic, hypoxemic. He was hypotensive and was requiring at least 3 L of normal saline fluid boluses.  CT showed a multifocal pneumonia.In hopsital he complained of back pain and underwent MRI (limited by pain). This did not show infection but did show C2 and T2 metastatic disease.  His blood cultures grew MRSA. He was d/c home with IV vanco on 9-11.  PIC has been removed.  He had onc f/u on 10-7 for neck pain and LAN. He was given a course of augmentin.  His labs were concerning for a mild increase in Cr- he has cut back on his pain rx- stopped dilaudid, now only on methadone. Taking NSAIDs as well.  Feels much better today.   Review of Systems  Constitutional: Positive for appetite change. Negative for fever, chills and unexpected weight change.  HENT: Negative for trouble swallowing.   Gastrointestinal: Negative for diarrhea and constipation.  Genitourinary: Negative for difficulty urinating.  Musculoskeletal: Positive for back pain.       Objective:   Physical Exam  Constitutional: He appears well-developed and well-nourished.  HENT:  Mouth/Throat: No oropharyngeal exudate.  Eyes: EOM are normal. Pupils are equal, round, and reactive to light.  Neck: Neck supple.    Cardiovascular: Normal rate, regular rhythm and normal heart sounds.   Lymphadenopathy:    He has cervical adenopathy.          Assessment & Plan:

## 2015-10-01 NOTE — Progress Notes (Signed)
10/01/2015  Patient Name:   Martyn Timme Virden Date of Birth:   [birth year] Medical Record Number: [id number]  BP 148/75 mmHg  Pulse 72  Temp(Src) 97.7 F (36.5 C) (Oral)   Aaron Mose Revelo presents for evaluation of recently inserted upper and lower complete dentures. SUBJECTIVE: Patient is complaining of some irritation to the lower left lingual aspect.   OBJECTIVE: There is no evidence of denture irritation or ulceration.  The patient has xerostomia. Procedure: Pressure indicating paste was applied to the dentures. Adjustments were made as needed. [place] Thick PIP was applied to denture borders and adjustments made as needed. [place] Occlusion evaluated and adjustments made as needed for Centric Relation and protrusive strokes. Patient has End to End and anterior occlusion with bilateral posterior crossbite. Patient accepts results. Patient to keep dentures out if sore spots develop. Use salt water rinses as needed to aid healing. Return to clinic as scheduled for denture adjustment.   Call if problems arise before then.  [name], DDS

## 2015-10-07 LAB — CULTURE, BLOOD (SINGLE)
ORGANISM ID, BACTERIA: NO GROWTH
ORGANISM ID, BACTERIA: NO GROWTH

## 2015-10-08 ENCOUNTER — Encounter: Payer: Self-pay | Admitting: Hematology and Oncology

## 2015-10-08 ENCOUNTER — Other Ambulatory Visit (HOSPITAL_BASED_OUTPATIENT_CLINIC_OR_DEPARTMENT_OTHER): Payer: PPO

## 2015-10-08 ENCOUNTER — Ambulatory Visit (HOSPITAL_BASED_OUTPATIENT_CLINIC_OR_DEPARTMENT_OTHER): Payer: PPO | Admitting: Hematology and Oncology

## 2015-10-08 ENCOUNTER — Telehealth: Payer: Self-pay | Admitting: Hematology and Oncology

## 2015-10-08 VITALS — BP 151/74 | HR 64 | Temp 97.7°F | Resp 20 | Ht 69.0 in | Wt 224.0 lb

## 2015-10-08 DIAGNOSIS — C01 Malignant neoplasm of base of tongue: Secondary | ICD-10-CM | POA: Diagnosis not present

## 2015-10-08 DIAGNOSIS — R591 Generalized enlarged lymph nodes: Secondary | ICD-10-CM | POA: Diagnosis not present

## 2015-10-08 DIAGNOSIS — C029 Malignant neoplasm of tongue, unspecified: Secondary | ICD-10-CM

## 2015-10-08 DIAGNOSIS — Z23 Encounter for immunization: Secondary | ICD-10-CM | POA: Diagnosis not present

## 2015-10-08 DIAGNOSIS — G8929 Other chronic pain: Secondary | ICD-10-CM

## 2015-10-08 DIAGNOSIS — M549 Dorsalgia, unspecified: Secondary | ICD-10-CM | POA: Diagnosis not present

## 2015-10-08 DIAGNOSIS — D638 Anemia in other chronic diseases classified elsewhere: Secondary | ICD-10-CM

## 2015-10-08 DIAGNOSIS — M542 Cervicalgia: Secondary | ICD-10-CM

## 2015-10-08 DIAGNOSIS — A4101 Sepsis due to Methicillin susceptible Staphylococcus aureus: Secondary | ICD-10-CM

## 2015-10-08 DIAGNOSIS — C7951 Secondary malignant neoplasm of bone: Secondary | ICD-10-CM

## 2015-10-08 LAB — MAGNESIUM (CC13): MAGNESIUM: 1.9 mg/dL (ref 1.5–2.5)

## 2015-10-08 LAB — CBC WITH DIFFERENTIAL/PLATELET
BASO%: 0.5 % (ref 0.0–2.0)
BASOS ABS: 0 10*3/uL (ref 0.0–0.1)
EOS ABS: 0.1 10*3/uL (ref 0.0–0.5)
EOS%: 2 % (ref 0.0–7.0)
HCT: 35.3 % — ABNORMAL LOW (ref 38.4–49.9)
HGB: 11.5 g/dL — ABNORMAL LOW (ref 13.0–17.1)
LYMPH%: 12 % — AB (ref 14.0–49.0)
MCH: 29.9 pg (ref 27.2–33.4)
MCHC: 32.6 g/dL (ref 32.0–36.0)
MCV: 91.7 fL (ref 79.3–98.0)
MONO#: 0.5 10*3/uL (ref 0.1–0.9)
MONO%: 10.1 % (ref 0.0–14.0)
NEUT%: 75.4 % — AB (ref 39.0–75.0)
NEUTROS ABS: 3.7 10*3/uL (ref 1.5–6.5)
PLATELETS: 176 10*3/uL (ref 140–400)
RBC: 3.85 10*6/uL — AB (ref 4.20–5.82)
RDW: 15.8 % — ABNORMAL HIGH (ref 11.0–14.6)
WBC: 4.9 10*3/uL (ref 4.0–10.3)
lymph#: 0.6 10*3/uL — ABNORMAL LOW (ref 0.9–3.3)

## 2015-10-08 LAB — COMPREHENSIVE METABOLIC PANEL (CC13)
ALBUMIN: 3.7 g/dL (ref 3.5–5.0)
ALT: 11 U/L (ref 0–55)
ANION GAP: 9 meq/L (ref 3–11)
AST: 15 U/L (ref 5–34)
Alkaline Phosphatase: 124 U/L (ref 40–150)
BUN: 22.8 mg/dL (ref 7.0–26.0)
CALCIUM: 9.3 mg/dL (ref 8.4–10.4)
CO2: 25 mEq/L (ref 22–29)
CREATININE: 1.3 mg/dL (ref 0.7–1.3)
Chloride: 107 mEq/L (ref 98–109)
EGFR: 60 mL/min/{1.73_m2} — AB (ref >90)
GLUCOSE: 111 mg/dL (ref 70–140)
Potassium: 4.5 mEq/L (ref 3.5–5.1)
Sodium: 141 mEq/L (ref 136–145)
TOTAL PROTEIN: 7 g/dL (ref 6.4–8.3)
Total Bilirubin: 0.35 mg/dL (ref 0.20–1.20)

## 2015-10-08 MED ORDER — INFLUENZA VAC SPLIT QUAD 0.5 ML IM SUSY
0.5000 mL | PREFILLED_SYRINGE | Freq: Once | INTRAMUSCULAR | Status: AC
  Administered 2015-10-08: 0.5 mL via INTRAMUSCULAR
  Filled 2015-10-08: qty 0.5

## 2015-10-08 NOTE — Assessment & Plan Note (Signed)
He has persistent palpable lymphadenopathy on the left side of the neck, unchanged. His performance status has improved now. He has completed oral antibiotic therapy for recent bacteremia.  I recommend CT scan of the neck, chest, abdomen and pelvis with contrast to restage him prior to starting on treatment.

## 2015-10-08 NOTE — Telephone Encounter (Signed)
Gave and pritned appt sched and avs fo rpt for NOV...gv barium

## 2015-10-08 NOTE — Assessment & Plan Note (Signed)
He has chronic neck pain. His pain is well controlled with methadone half a tablet 3 times a day  He has not needed to take Dilaudid as needed

## 2015-10-08 NOTE — Assessment & Plan Note (Signed)
This is likely anemia of chronic disease. The patient denies recent history of bleeding such as epistaxis, hematuria or hematochezia. He is asymptomatic from the anemia. We will observe for now.  

## 2015-10-08 NOTE — Assessment & Plan Note (Signed)
He has completed antibiotic treatment for this.

## 2015-10-08 NOTE — Progress Notes (Signed)
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   04/13/2015 Initial Biopsy Accession [id number]:  Lymph node, needle/core biopsy - SCC, p16 positive.   04/18/2015 Initial Diagnosis Tongue cancer   04/24/2015 Imaging PET CT showed tongue cancer, lung nodule and possible bone mets   04/26/2015 Surgery He had dental extractions   05/03/2015 Imaging CT neck with contrast: L base of tongue with regional adenopathy, metastatic disease C2, C3, T2 vertebra; posterior L fourth rib.   05/09/2015 Procedure Port-a-cath placed.   05/11/2015 - 05/23/2015 Radiation Therapy Palliative radiation therapy:  1) T1-T3 and Left 3rd posterior rib / 30 Gy in 10 fractions, 2) Base of tongue, neck, and Cspine / 30 Gy in 10 fractions.   06/01/2015 - 07/17/2015 Chemotherapy He received 2 cycles of carboplatin, 5-FU and cetuximab   06/04/2015 - 06/05/2015 Hospital Admission He was admitted to the hospital after syncopal episode and had right nondisplaced fibular fracture   07/20/2015 - 07/24/2015 Hospital Admission he was admitted to the hospital for weakness and uncontrolled nausea and dehydration   08/02/2015 Imaging PET CT scan showed positive response to treatment   08/04/2015 - 08/12/2015 Hospital Admission He was admitted to the hospital with sepsis, MRSA bacteremia and respiratory failure   08/07/2015 Surgery PAC removed r/t sepsis, MRSA  bacteremia.    INTERVAL HISTORY: Please see below for problem oriented charting.  he is seen for further follow-up. He is  doing well and eating well. His pain is under excellent control. Denies recent fevers or chills. He has persistent lymphadenopathy in the neck, unchanged  REVIEW OF SYSTEMS:   Constitutional: Denies fevers, chills or abnormal weight loss Eyes: Denies blurriness of vision Ears, nose, mouth, throat, and face: Denies mucositis or sore throat Respiratory: Denies cough, dyspnea or wheezes Cardiovascular: Denies palpitation, chest discomfort or lower extremity swelling Gastrointestinal:  Denies nausea, heartburn or change in bowel habits Skin: Denies abnormal skin rashes Lymphatics: Denies new lymphadenopathy or easy bruising Neurological:Denies numbness, tingling or new weaknesses Behavioral/Psych: Mood is stable, no new changes  All other systems were reviewed with the patient and are negative.  I have reviewed the past medical history, past surgical history, social history and family history with the patient and they are unchanged from previous note.  ALLERGIES:  is allergic to morphine and related.  MEDICATIONS:  Current Outpatient Prescriptions  Medication Sig Dispense Refill  . Armodafinil 250 MG tablet Take 250 mg by mouth daily.    Marland Kitchen aspirin EC 81 MG tablet Take 81 mg by mouth daily.    . fluticasone (FLONASE) 50 MCG/ACT nasal spray Place 2 sprays into both nostrils daily. 16 g 0  . gabapentin (NEURONTIN) 100 MG capsule Take 600 mg by mouth 2 (two) times daily. Takes with supper and at bedtime    . ibuprofen (ADVIL,MOTRIN) 200 MG tablet Take 600 mg by mouth every 6 (six) hours as needed for mild pain or moderate pain.     Marland Kitchen lansoprazole (PREVACID) 30 MG capsule Take 30 mg by mouth daily at 12 noon.    Marland Kitchen  Magnesium Oxide 400 MG CAPS Take 1 capsule (400 mg total) by mouth daily. 30 capsule 0  . methadone (DOLOPHINE) 10 MG tablet Take 2 tablets (20 mg total) by mouth every 8 (eight) hours. (Patient taking differently: Take 5 mg by mouth every 8 (eight) hours. ) 30 tablet 0  .  polyethylene glycol powder (GLYCOLAX/MIRALAX) powder Take 17 g by mouth 2 (two) times daily. 255 g 0   No current facility-administered medications for this visit.    PHYSICAL EXAMINATION: ECOG PERFORMANCE STATUS: 1 - Symptomatic but completely ambulatory  Filed Vitals:   10/08/15 1416  BP: 151/74  Pulse: 64  Temp: 97.7 F (36.5 C)  Resp: 20   Filed Weights   10/08/15 1416  Weight: 224 lb (101.606 kg)    GENERAL:alert, no distress and comfortable SKIN: skin color, texture, turgor are normal, no rashes or significant lesions EYES: normal, Conjunctiva are pink and non-injected, sclera clear OROPHARYNX:no exudate, no erythema and lips, buccal mucosa, and tongue normal  NECK: he has lymphedema around his neck. LYMPH:   He has palpable lymphadenopathy on the left side of the neck, unchanged compared to prior visit LUNGS: clear to auscultation and percussion with normal breathing effort HEART: regular rate & rhythm and no murmurs and no lower extremity edema ABDOMEN:abdomen soft, non-tender and normal bowel sounds Musculoskeletal:no cyanosis of digits and no clubbing  NEURO: alert & oriented x 3 with fluent speech, no focal motor/sensory deficits  LABORATORY DATA:  I have reviewed the data as listed    Component Value Date/Time   NA 141 10/08/2015 1407   NA 139 10/01/2015 0957   K 4.5 10/08/2015 1407   K 4.3 10/01/2015 0957   CL 104 10/01/2015 0957   CO2 25 10/08/2015 1407   CO2 25 10/01/2015 0957   GLUCOSE 111 10/08/2015 1407   GLUCOSE 121* 10/01/2015 0957   BUN 22.8 10/08/2015 1407   BUN 23 10/01/2015 0957   CREATININE 1.3 10/08/2015 1407   CREATININE 1.22 10/01/2015 0957   CREATININE 1.01 08/12/2015 0244   CALCIUM 9.3 10/08/2015 1407   CALCIUM 9.2 10/01/2015 0957   PROT 7.0 10/08/2015 1407   PROT 7.0 08/12/2015 0244   ALBUMIN 3.7 10/08/2015 1407   ALBUMIN 2.9* 08/12/2015 0244   AST 15 10/08/2015 1407   AST 20 08/12/2015 0244   ALT 11 10/08/2015 1407   ALT 20  08/12/2015 0244   ALKPHOS 124 10/08/2015 1407   ALKPHOS 83 08/12/2015 0244   BILITOT 0.35 10/08/2015 1407   BILITOT 0.8 08/12/2015 0244   GFRNONAA >60 08/12/2015 0244   GFRAA >60 08/12/2015 0244    No results found for: SPEP, UPEP  Lab Results  Component Value Date   WBC 4.9 10/08/2015   NEUTROABS 3.7 10/08/2015   HGB 11.5* 10/08/2015   HCT 35.3* 10/08/2015   MCV 91.7 10/08/2015   PLT 176 10/08/2015      Chemistry      Component Value Date/Time   NA 141 10/08/2015 1407   NA 139 10/01/2015 0957   K 4.5 10/08/2015 1407   K 4.3 10/01/2015 0957   CL 104 10/01/2015 0957   CO2 25 10/08/2015 1407   CO2 25 10/01/2015 0957   BUN 22.8 10/08/2015 1407   BUN 23 10/01/2015 0957   CREATININE 1.3 10/08/2015 1407   CREATININE 1.22 10/01/2015 0957   CREATININE 1.01 08/12/2015 0244      Component Value Date/Time   CALCIUM 9.3 10/08/2015 1407   CALCIUM 9.2 10/01/2015 0957  ALKPHOS 124 10/08/2015 1407   ALKPHOS 83 08/12/2015 0244   AST 15 10/08/2015 1407   AST 20 08/12/2015 0244   ALT 11 10/08/2015 1407   ALT 20 08/12/2015 0244   BILITOT 0.35 10/08/2015 1407   BILITOT 0.8 08/12/2015 0244     ASSESSMENT & PLAN:  Tongue cancer He has persistent palpable lymphadenopathy on the left side of the neck, unchanged. His performance status has improved now. He has completed oral antibiotic therapy for recent bacteremia.  I recommend CT scan of the neck, chest, abdomen and pelvis with contrast to restage him prior to starting on treatment.   Chronic neck and back pain He has chronic neck pain. His pain is well controlled with methadone half a tablet 3 times a day  He has not needed to take Dilaudid as needed    Anemia due to chronic illness This is likely anemia of chronic disease. The patient denies recent history of bleeding such as epistaxis, hematuria or hematochezia. He is asymptomatic from the anemia. We will observe for now.   Staphylococcus aureus bacteremia with  sepsis  He has completed antibiotic treatment for this.   Orders Placed This Encounter  Procedures  . CT Chest W Contrast    Standing Status: Future     Number of Occurrences:      Standing Expiration Date: 12/07/2016    Order Specific Question:  Reason for Exam (SYMPTOM  OR DIAGNOSIS REQUIRED)    Answer:  staging metastatic tonsil ca, assess for disease status    Order Specific Question:  Preferred imaging location?    Answer:  [facility]  . CT Abdomen Pelvis W Contrast    Standing Status: Future     Number of Occurrences:      Standing Expiration Date: 01/07/2017    Order Specific Question:  Reason for Exam (SYMPTOM  OR DIAGNOSIS REQUIRED)    Answer:  staging metastatic tonsil ca, assess for disease status    Order Specific Question:  Preferred imaging location?    Answer:  [facility]  . CT Soft Tissue Neck W Contrast    Standing Status: Future     Number of Occurrences:      Standing Expiration Date: 01/07/2017    Order Specific Question:  Reason for Exam (SYMPTOM  OR DIAGNOSIS REQUIRED)    Answer:  staging metastatic tonsil ca, assess for disease status    Order Specific Question:  Preferred imaging location?    Answer:  [facility]   All questions were answered. The patient knows to call the clinic with any problems, questions or concerns. No barriers to learning was detected. I spent 25 minutes counseling the patient face to face. The total time spent in the appointment was 30 minutes and more than 50% was on counseling and review of test results     [facility], [place], MD 10/08/2015 3:00 PM

## 2015-10-15 ENCOUNTER — Ambulatory Visit (HOSPITAL_COMMUNITY)
Admission: RE | Admit: 2015-10-15 | Discharge: 2015-10-15 | Disposition: A | Discharge status: Home / Self Care | Payer: PPO | Source: Ambulatory Visit | Attending: Hematology and Oncology | Admitting: Hematology and Oncology

## 2015-10-15 ENCOUNTER — Encounter (HOSPITAL_COMMUNITY): Payer: Self-pay

## 2015-10-15 ENCOUNTER — Encounter: Payer: Self-pay | Admitting: Infectious Disease

## 2015-10-15 DIAGNOSIS — Z923 Personal history of irradiation: Secondary | ICD-10-CM | POA: Insufficient documentation

## 2015-10-15 DIAGNOSIS — K5730 Diverticulosis of large intestine without perforation or abscess without bleeding: Secondary | ICD-10-CM | POA: Insufficient documentation

## 2015-10-15 DIAGNOSIS — C029 Malignant neoplasm of tongue, unspecified: Secondary | ICD-10-CM | POA: Insufficient documentation

## 2015-10-15 DIAGNOSIS — R918 Other nonspecific abnormal finding of lung field: Secondary | ICD-10-CM | POA: Diagnosis not present

## 2015-10-15 DIAGNOSIS — N200 Calculus of kidney: Secondary | ICD-10-CM | POA: Insufficient documentation

## 2015-10-15 DIAGNOSIS — Z9221 Personal history of antineoplastic chemotherapy: Secondary | ICD-10-CM | POA: Diagnosis not present

## 2015-10-15 DIAGNOSIS — C770 Secondary and unspecified malignant neoplasm of lymph nodes of head, face and neck: Secondary | ICD-10-CM | POA: Insufficient documentation

## 2015-10-15 DIAGNOSIS — K115 Sialolithiasis: Secondary | ICD-10-CM | POA: Diagnosis not present

## 2015-10-15 DIAGNOSIS — C7951 Secondary malignant neoplasm of bone: Secondary | ICD-10-CM | POA: Diagnosis not present

## 2015-10-15 MED ORDER — IOHEXOL 300 MG/ML  SOLN
100.0000 mL | Freq: Once | INTRAMUSCULAR | Status: AC | PRN
  Administered 2015-10-15: 100 mL via INTRAVENOUS

## 2015-10-16 ENCOUNTER — Encounter: Payer: Self-pay | Admitting: Hematology and Oncology

## 2015-10-16 ENCOUNTER — Ambulatory Visit (HOSPITAL_BASED_OUTPATIENT_CLINIC_OR_DEPARTMENT_OTHER): Payer: PPO | Admitting: Hematology and Oncology

## 2015-10-16 ENCOUNTER — Telehealth: Payer: Self-pay | Admitting: Hematology and Oncology

## 2015-10-16 VITALS — BP 159/85 | HR 74 | Temp 97.9°F | Resp 18 | Ht 69.0 in | Wt 223.0 lb

## 2015-10-16 DIAGNOSIS — M549 Dorsalgia, unspecified: Secondary | ICD-10-CM

## 2015-10-16 DIAGNOSIS — C7951 Secondary malignant neoplasm of bone: Secondary | ICD-10-CM | POA: Diagnosis not present

## 2015-10-16 DIAGNOSIS — G8929 Other chronic pain: Secondary | ICD-10-CM

## 2015-10-16 DIAGNOSIS — C01 Malignant neoplasm of base of tongue: Secondary | ICD-10-CM | POA: Diagnosis not present

## 2015-10-16 DIAGNOSIS — D638 Anemia in other chronic diseases classified elsewhere: Secondary | ICD-10-CM

## 2015-10-16 DIAGNOSIS — C029 Malignant neoplasm of tongue, unspecified: Secondary | ICD-10-CM

## 2015-10-16 DIAGNOSIS — M542 Cervicalgia: Secondary | ICD-10-CM

## 2015-10-16 MED ORDER — METHADONE HCL 10 MG PO TABS: 20.0000 mg | ORAL_TABLET | Freq: Three times a day (TID) | ORAL | Status: DC

## 2015-10-16 NOTE — Assessment & Plan Note (Signed)
CT scan showed no evidence of active disease. I recommend chemotherapy holiday and repeat imaging study with blood work in 3 months. He agreed to proceed

## 2015-10-16 NOTE — Progress Notes (Signed)
[facility] OFFICE PROGRESS NOTE  Patient Care Team: [name], MD as PCP - General (Family Medicine) Leota Sauers, RN as Oncology Nurse Navigator [name], MD as Consulting Physician (Hematology and Oncology) Eppie Gibson, MD as Attending Physician (Radiation Oncology) Karie Mainland, RD as Dietitian (Nutrition)  SUMMARY OF ONCOLOGIC HISTORY:   Tongue cancer ([place])   04/10/2015 Imaging CT Neck with contrast:  L base of tongue SCC with regional adenopathy; suspected metastatic disease to C2, T2.   04/13/2015 Initial Biopsy Accession [id number]:  Lymph node, needle/core biopsy - SCC, p16 positive.   04/18/2015 Initial Diagnosis Tongue cancer   04/24/2015 Imaging PET CT showed tongue cancer, lung nodule and possible bone mets   04/26/2015 Surgery He had dental extractions   05/03/2015 Imaging CT neck with contrast: L base of tongue with regional adenopathy, metastatic disease C2, C3, T2 vertebra; posterior L fourth rib.   05/09/2015 Procedure Port-a-cath placed.   05/11/2015 - 05/23/2015 Radiation Therapy Palliative radiation therapy:  1) T1-T3 and Left 3rd posterior rib / 30 Gy in 10 fractions, 2) Base of tongue, neck, and Cspine / 30 Gy in 10 fractions.   06/01/2015 - 07/17/2015 Chemotherapy He received 2 cycles of carboplatin, 5-FU and cetuximab   06/04/2015 - 06/05/2015 Hospital Admission He was admitted to the hospital after syncopal episode and had right nondisplaced fibular fracture   07/20/2015 - 07/24/2015 Hospital Admission he was admitted to the hospital for weakness and uncontrolled nausea and dehydration   08/02/2015 Imaging PET CT scan showed positive response to treatment   08/04/2015 - 08/12/2015 Hospital Admission He was admitted to the hospital with sepsis, MRSA bacteremia and respiratory failure   08/07/2015 Surgery PAC removed r/t sepsis, MRSA  bacteremia.   10/15/2015 Imaging Ct scan of the neck, chest, abdomen and pelvis showed stable sclerotic lesions. No new disease  progression    INTERVAL HISTORY: Please see below for problem oriented charting. He is seen for further follow-up. He is doing well and eating well. His pain is under excellent control. Denies recent fevers or chills. He has persistent lymphadenopathy in the neck, unchanged  REVIEW OF SYSTEMS:   Constitutional: Denies fevers, chills or abnormal weight loss Eyes: Denies blurriness of vision Ears, nose, mouth, throat, and face: Denies mucositis or sore throat Respiratory: Denies cough, dyspnea or wheezes Cardiovascular: Denies palpitation, chest discomfort or lower extremity swelling Gastrointestinal:  Denies nausea, heartburn or change in bowel habits Skin: Denies abnormal skin rashes Lymphatics: Denies new lymphadenopathy or easy bruising Neurological:Denies numbness, tingling or new weaknesses Behavioral/Psych: Mood is stable, no new changes  All other systems were reviewed with the patient and are negative.  I have reviewed the past medical history, past surgical history, social history and family history with the patient and they are unchanged from previous note.  ALLERGIES:  is allergic to morphine and related.  MEDICATIONS:  Current Outpatient Prescriptions  Medication Sig Dispense Refill  . Armodafinil 250 MG tablet Take 250 mg by mouth daily.    Marland Kitchen aspirin EC 81 MG tablet Take 81 mg by mouth daily.    . fluticasone (FLONASE) 50 MCG/ACT nasal spray Place 2 sprays into both nostrils daily. 16 g 0  . gabapentin (NEURONTIN) 100 MG capsule Take 600 mg by mouth 2 (two) times daily. Takes with supper and at bedtime    . ibuprofen (ADVIL,MOTRIN) 200 MG tablet Take 600 mg by mouth every 6 (six) hours as needed for mild pain or moderate pain.     Marland Kitchen  lansoprazole (PREVACID) 30 MG capsule Take 30 mg by mouth daily at 12 noon.    . Magnesium Oxide 400 MG CAPS Take 1 capsule (400 mg total) by mouth daily. 30 capsule 0  . methadone (DOLOPHINE) 10 MG tablet Take 2 tablets (20 mg total) by  mouth every 8 (eight) hours. 90 tablet 0  . polyethylene glycol powder (GLYCOLAX/MIRALAX) powder Take 17 g by mouth 2 (two) times daily. 255 g 0   No current facility-administered medications for this visit.    PHYSICAL EXAMINATION: ECOG PERFORMANCE STATUS: 1 - Symptomatic but completely ambulatory  Filed Vitals:   10/16/15 1407  BP: 159/85  Pulse: 74  Temp: 97.9 F (36.6 C)  Resp: 18   Filed Weights   10/16/15 1407  Weight: 223 lb (101.152 kg)    GENERAL:alert, no distress and comfortable SKIN: skin color, texture, turgor are normal, no rashes or significant lesions EYES: normal, Conjunctiva are pink and non-injected, sclera clear Musculoskeletal:no cyanosis of digits and no clubbing  NEURO: alert & oriented x 3 with fluent speech, no focal motor/sensory deficits  LABORATORY DATA:  I have reviewed the data as listed    Component Value Date/Time   NA 141 10/08/2015 1407   NA 139 10/01/2015 0957   K 4.5 10/08/2015 1407   K 4.3 10/01/2015 0957   CL 104 10/01/2015 0957   CO2 25 10/08/2015 1407   CO2 25 10/01/2015 0957   GLUCOSE 111 10/08/2015 1407   GLUCOSE 121* 10/01/2015 0957   BUN 22.8 10/08/2015 1407   BUN 23 10/01/2015 0957   CREATININE 1.3 10/08/2015 1407   CREATININE 1.22 10/01/2015 0957   CREATININE 1.01 08/12/2015 0244   CALCIUM 9.3 10/08/2015 1407   CALCIUM 9.2 10/01/2015 0957   PROT 7.0 10/08/2015 1407   PROT 7.0 08/12/2015 0244   ALBUMIN 3.7 10/08/2015 1407   ALBUMIN 2.9* 08/12/2015 0244   AST 15 10/08/2015 1407   AST 20 08/12/2015 0244   ALT 11 10/08/2015 1407   ALT 20 08/12/2015 0244   ALKPHOS 124 10/08/2015 1407   ALKPHOS 83 08/12/2015 0244   BILITOT 0.35 10/08/2015 1407   BILITOT 0.8 08/12/2015 0244   GFRNONAA >60 08/12/2015 0244   GFRAA >60 08/12/2015 0244    No results found for: SPEP, UPEP  Lab Results  Component Value Date   WBC 4.9 10/08/2015   NEUTROABS 3.7 10/08/2015   HGB 11.5* 10/08/2015   HCT 35.3* 10/08/2015   MCV 91.7  10/08/2015   PLT 176 10/08/2015      Chemistry      Component Value Date/Time   NA 141 10/08/2015 1407   NA 139 10/01/2015 0957   K 4.5 10/08/2015 1407   K 4.3 10/01/2015 0957   CL 104 10/01/2015 0957   CO2 25 10/08/2015 1407   CO2 25 10/01/2015 0957   BUN 22.8 10/08/2015 1407   BUN 23 10/01/2015 0957   CREATININE 1.3 10/08/2015 1407   CREATININE 1.22 10/01/2015 0957   CREATININE 1.01 08/12/2015 0244      Component Value Date/Time   CALCIUM 9.3 10/08/2015 1407   CALCIUM 9.2 10/01/2015 0957   ALKPHOS 124 10/08/2015 1407   ALKPHOS 83 08/12/2015 0244   AST 15 10/08/2015 1407   AST 20 08/12/2015 0244   ALT 11 10/08/2015 1407   ALT 20 08/12/2015 0244   BILITOT 0.35 10/08/2015 1407   BILITOT 0.8 08/12/2015 0244       RADIOGRAPHIC STUDIES:i reviewed the scans with him and his daughter  I have personally reviewed the radiological images as listed and agreed with the findings in the report. 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 Restaging. EXAM: CT NECK WITH CONTRAST TECHNIQUE: Multidetector CT imaging of the neck was performed using the standard protocol following the bolus administration of intravenous contrast. CONTRAST:  180mL OMNIPAQUE IOHEXOL 300 MG/ML SOLN Subsequent encounter. in conjunction with contrast enhanced imaging of the chest, abdomen, and pelvis reported separately. COMPARISON:  Cervical spine MRI 08/09/2015. PET-CT 08/02/2015. Neck CT 05/04/2015 FINDINGS: Pharynx and larynx: Resolved asymmetric enhancement at the left tongue base and glossal tonsillar sulcus compared to June. Diffuse pharyngeal mucosal space thickening compatible with sequelae of XRT. Negative parapharyngeal spaces. Trace retropharyngeal effusion. Aside from thickening of the epiglottis related to XRT, negative larynx. Salivary glands: Post XRT changes to the bilateral submandibular and parotid glands with superimposed  bulky 13 mm left submandibular gland sialolithiasis again noted. There is subtle asymmetric hyper enhancement of the left submandibular gland, as well as asymmetric increased left submandibular space stranding (series 7, image 52). Negative sublingual space. Thyroid: Negative. Lymph nodes: Regressed malignant lymph nodes throughout the left level 2 nodal station. Regressed left level 5 and level 3 nodes. The largest residual nodes are at the left level IIIb/level 4 nodal station measuring 10 mm short axis (series 7, image 69) these remain rounded. On the right side lymph node size also appears mildly decreased throughout. The recently described hypermetabolic node on the right is 7 mm short axis series 7, image 54 at the lower right level 2 station. This was a 10 mm node in June. None of the right side nodes appear increased. Vascular: Major vascular structures in the neck and at the skullbase remain patent. Limited intracranial: Negative. Visualized orbits: Negative. Mastoids and visualized paranasal sinuses: Right maxillary sinus mucoperiosteal thickening Re demonstrated. Decreased sinus fluid level. Other Visualized paranasal sinuses and mastoids are clear. Skeleton: Absent dentition. Lucent lesion in the C2 vertebral body in June now appears sclerotic (sagittal image 40). Partial destruction of the T2 vertebral body appears stable from the recent MRI. Motion artifact at the C1 level. No new osseous abnormality identified in the neck. Upper chest: Reported separately today. IMPRESSION: 1. Regression of disease since 05/04/2015. No residual pharyngeal mass identified. 2. The largest residual abnormal lymph nodes are at the left level 3/4 station, rounded and measuring up to 10 mm short axis. 3. The recently described newly hypermetabolic right lower level 2 node measures 7 mm on series 7, image 54 and has decreased in size since June. 4. Stable C2 and T2 level osseous metastatic disease from the recent cervical  spine. 5. Chest abdomen and pelvis CT findings today reported separately. Electronically Signed   By: Genevie Ann M.D.   On: 10/15/2015 17:14   Ct Chest W Contrast  10/15/2015  CLINICAL DATA:  Followup metastatic tongue carcinoma. Status post surgery, chemotherapy, and radiation therapy. EXAM: CT CHEST, ABDOMEN, AND PELVIS WITH CONTRAST TECHNIQUE: Multidetector CT imaging of the chest, abdomen and pelvis was performed following the standard protocol during bolus administration of intravenous contrast. CONTRAST:  160mL OMNIPAQUE IOHEXOL 300 MG/ML  SOLN COMPARISON:  PET-CT on 08/02/2015 FINDINGS: CT CHEST FINDINGS Mediastinum/Lymph Nodes: No masses, pathologically enlarged lymph nodes, or other significant abnormality. Lungs/Pleura: No pulmonary mass, infiltrate, or effusion. 4 mm pulmonary nodule in the right lower lobe on image 40 and a 4 mm pulmonary nodule in the peripheral  left midlung on image 29 appear stable. Musculoskeletal: Mixed lytic and sclerotic lesions in the thoracic spine and bilateral rib fracture deformities remain stable. No new or enlarging bone lesions identified. CT ABDOMEN PELVIS FINDINGS Hepatobiliary: No masses or other significant abnormality. Gallbladder is unremarkable. Pancreas: No mass, inflammatory changes, or other significant abnormality. Spleen: Within normal limits in size and appearance. Adrenals/Urinary Tract: No masses identified. No evidence of hydronephrosis. A few tiny nonobstructive calculi in both kidneys are stable. Stomach/Bowel: No evidence of bowel obstruction or wall thickening. Diverticulosis is again seen involving the sigmoid colon, however there is no evidence of diverticulitis. Misty density in soft tissue stranding in the jejunal mesenteric fat within the abdomen is stable. Vascular/Lymphatic: No pathologically enlarged lymph nodes. No evidence of abdominal aortic aneurysm. Reproductive: No mass or other significant abnormality. Other: None. Musculoskeletal:  No  suspicious bone lesions identified. IMPRESSION: Stable appearance of thoracic bone metastases. No definite soft tissue metastatic disease identified within the chest, abdomen, or pelvis. Stable misty density and stranding in the abdominal mesenteric fat, which could represent mesenteric panniculitis/fibrosis. Colonic diverticulosis. No radiographic evidence of diverticulitis. Nonobstructive bilateral nephrolithiasis. No evidence of hydronephrosis. Electronically Signed   By: Earle Gell M.D.   On: 10/15/2015 17:13   Ct Abdomen Pelvis W Contrast  10/15/2015  CLINICAL DATA:  Followup metastatic tongue carcinoma. Status post surgery, chemotherapy, and radiation therapy. EXAM: CT CHEST, ABDOMEN, AND PELVIS WITH CONTRAST TECHNIQUE: Multidetector CT imaging of the chest, abdomen and pelvis was performed following the standard protocol during bolus administration of intravenous contrast. CONTRAST:  157mL OMNIPAQUE IOHEXOL 300 MG/ML  SOLN COMPARISON:  PET-CT on 08/02/2015 FINDINGS: CT CHEST FINDINGS Mediastinum/Lymph Nodes: No masses, pathologically enlarged lymph nodes, or other significant abnormality. Lungs/Pleura: No pulmonary mass, infiltrate, or effusion. 4 mm pulmonary nodule in the right lower lobe on image 40 and a 4 mm pulmonary nodule in the peripheral left midlung on image 29 appear stable. Musculoskeletal: Mixed lytic and sclerotic lesions in the thoracic spine and bilateral rib fracture deformities remain stable. No new or enlarging bone lesions identified. CT ABDOMEN PELVIS FINDINGS Hepatobiliary: No masses or other significant abnormality. Gallbladder is unremarkable. Pancreas: No mass, inflammatory changes, or other significant abnormality. Spleen: Within normal limits in size and appearance. Adrenals/Urinary Tract: No masses identified. No evidence of hydronephrosis. A few tiny nonobstructive calculi in both kidneys are stable. Stomach/Bowel: No evidence of bowel obstruction or wall thickening.  Diverticulosis is again seen involving the sigmoid colon, however there is no evidence of diverticulitis. Misty density in soft tissue stranding in the jejunal mesenteric fat within the abdomen is stable. Vascular/Lymphatic: No pathologically enlarged lymph nodes. No evidence of abdominal aortic aneurysm. Reproductive: No mass or other significant abnormality. Other: None. Musculoskeletal:  No suspicious bone lesions identified. IMPRESSION: Stable appearance of thoracic bone metastases. No definite soft tissue metastatic disease identified within the chest, abdomen, or pelvis. Stable misty density and stranding in the abdominal mesenteric fat, which could represent mesenteric panniculitis/fibrosis. Colonic diverticulosis. No radiographic evidence of diverticulitis. Nonobstructive bilateral nephrolithiasis. No evidence of hydronephrosis. Electronically Signed   By: Earle Gell M.D.   On: 10/15/2015 17:13     ASSESSMENT & PLAN:  Tongue cancer  CT scan showed no evidence of active disease. I recommend chemotherapy holiday and repeat imaging study with blood work in 3 months. He agreed to proceed  Chronic neck and back pain He has chronic neck pain. His pain is well controlled with methadone half a tablet 3 times a day  He has not needed to take Dilaudid as needed I refilled his prescription today. 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 The patient denies recent history of bleeding such as epistaxis, hematuria or hematochezia. He is asymptomatic from the anemia. We will observe for now.    Orders Placed This Encounter  Procedures  . NM PET Image Restag (PS) Skull Base To Thigh    Standing Status: Future     Number of Occurrences:      Standing Expiration Date: 12/15/2016    Order Specific Question:  Reason for Exam (SYMPTOM  OR DIAGNOSIS REQUIRED)    Answer:  staging tongue ca with metastatic disease to the bone, exclude recurrence     Order Specific Question:  Preferred imaging location?    Answer:  [facility]   All questions were answered. The patient knows to call the clinic with any problems, questions or concerns. No barriers to learning was detected. I spent 20 minutes counseling the patient face to face. The total time spent in the appointment was 25 minutes and more than 50% was on counseling and review of test results     [facility], [place], MD 10/16/2015 2:30 PM

## 2015-10-16 NOTE — Telephone Encounter (Signed)
lvm for pt regarding to  Feb 2017 appt.... °

## 2015-10-16 NOTE — Assessment & Plan Note (Signed)
This is likely anemia of chronic disease. The patient denies recent history of bleeding such as epistaxis, hematuria or hematochezia. He is asymptomatic from the anemia. We will observe for now.  

## 2015-10-16 NOTE — Assessment & Plan Note (Signed)
He has chronic neck pain. His pain is well controlled with methadone half a tablet 3 times a day  He has not needed to take Dilaudid as needed I refilled his prescription today. We discussed refill policy

## 2015-10-19 ENCOUNTER — Other Ambulatory Visit: Payer: Self-pay | Admitting: Hematology and Oncology

## 2015-10-22 ENCOUNTER — Encounter: Payer: Self-pay | Admitting: Infectious Disease

## 2015-10-22 ENCOUNTER — Encounter: Payer: Self-pay | Admitting: Infectious Diseases

## 2015-11-06 ENCOUNTER — Encounter (HOSPITAL_COMMUNITY): Payer: Self-pay | Admitting: Dentistry

## 2015-11-12 ENCOUNTER — Ambulatory Visit (HOSPITAL_COMMUNITY): Payer: Self-pay | Admitting: Dentistry

## 2015-11-12 ENCOUNTER — Encounter (HOSPITAL_COMMUNITY): Payer: Self-pay | Admitting: Dentistry

## 2015-11-12 NOTE — Progress Notes (Deleted)
11/12/2015  Patient Name:   Shane Cantu Head Date of Birth:   July 25, 1956 Medical Record Number: [id number]  There were no vitals taken for this visit.   [name] presents for evaluation of upper and lower complete dentures. SUBJECTIVE: Patient is complaining of some irritation to the lower left lingual aspect.   OBJECTIVE: There is no evidence of denture irritation or ulceration.  The patient has xerostomia. Procedure: Pressure indicating paste was applied to the dentures. Adjustments were made as needed. [place] Thick PIP was applied to denture borders and adjustments made as needed. [place] Occlusion evaluated and adjustments made as needed for Centric Relation and protrusive strokes. Patient has End to End and anterior occlusion with bilateral posterior crossbite. Patient accepts results. Patient to keep dentures out if sore spots develop. Use salt water rinses as needed to aid healing. Return to clinic as scheduled for denture adjustment.   Call if problems arise before then.  [name], DDS This encounter was created in error - please disregard.

## 2015-11-13 NOTE — Progress Notes (Signed)
Error

## 2015-11-15 ENCOUNTER — Telehealth: Payer: Self-pay | Admitting: *Deleted

## 2015-11-15 NOTE — Telephone Encounter (Signed)
  Oncology Nurse Navigator Documentation   Navigator Encounter Type: Telephone;6 month (11/15/15 1113) Patient Visit Type: Follow-up (11/15/15 1113)     Placed 10-month post-tmt follow-up call to check on patient's well-being. LVMM, asked him to return my call.  Gayleen Orem, RN, BSN, [place] at [place] [phone]                   Time Spent with Patient: 15 (11/15/15 1113)

## 2015-11-20 ENCOUNTER — Telehealth: Payer: Self-pay | Admitting: *Deleted

## 2015-11-20 NOTE — Telephone Encounter (Signed)
  Oncology Nurse Navigator Documentation   Navigator Encounter Type: Telephone (11/20/15 [id number]) Patient Visit Type: Follow-up (11/20/15 [id number])     Patient returned my 12/15 call.  Reported he is doing well, managing pain at acceptable level, carrying out ADLs without difficulty. I confirmed his understanding of upcoming February 2017 appts. He understands he can contact me with needs/concerns.  Gayleen Orem, RN, BSN, [place] at [place] [phone]                    Time Spent with Patient: 15 (11/20/15 0851)

## 2015-12-11 ENCOUNTER — Telehealth: Payer: Self-pay | Admitting: *Deleted

## 2015-12-11 ENCOUNTER — Other Ambulatory Visit: Payer: Self-pay | Admitting: *Deleted

## 2015-12-11 ENCOUNTER — Telehealth: Payer: Self-pay | Admitting: Hematology and Oncology

## 2015-12-11 NOTE — Telephone Encounter (Signed)
FYI: 1230 slot just got taken Please tell scheduler to put him in for 1 pm. I might be finishing up the 1230 sooner. No need to call patient back

## 2015-12-11 NOTE — Telephone Encounter (Signed)
Aware of appointment 1/11 per pof  anne

## 2015-12-11 NOTE — Telephone Encounter (Signed)
    Oncology Nurse Navigator Documentation    Navigator Encounter Type: Telephone (12/11/15 1132)               Barriers/Navigation Needs: Coordination of Care (12/11/15 1132)       Called patient to inform him [name] can see him tomorrow.  Of appt options available, he requested 12:30.  I placed POF accordingly, [name] and Cameo RN updated.  Gayleen Orem, RN, BSN, [place] at [place] [phone]                       Time Spent with Patient: 15 (12/11/15 1132)

## 2015-12-11 NOTE — Telephone Encounter (Signed)
  Oncology Nurse Navigator Documentation Navigator Location: [facility] (12/11/15 1441) Navigator Encounter Type: Telephone (12/11/15 1441)               Barriers/Navigation Needs: Coordination of Care (12/11/15 1441)     Confirmed with patient his appt is at 1:00 pm tomorrow, not 12:30 as he preferred.  He verbalized understanding.  Gayleen Orem, RN, BSN, [place] at [place] [phone]                         Time Spent with Patient: 15 (12/11/15 1441)

## 2015-12-11 NOTE — Telephone Encounter (Signed)
Availability: tomorrow 830, 9, 930, 1130, 1230, 1pm, 30 mins appt He has to come in for eval No need labs Please call him and put POF for time of his choice

## 2015-12-11 NOTE — Telephone Encounter (Signed)
  Oncology Nurse Navigator Documentation Navigator Location: [facility] (12/11/15 1014) Navigator Encounter Type: Telephone (12/11/15 1014)             Treatment Phase: Post-Tx Follow-up (12/11/15 1014) Barriers/Navigation Needs: Coordination of Care (12/11/15 1014)                Acuity: Level 1 (12/11/15 1014)     Patient called to report bilateral neck swelling (L>R) onset last week. He further noted:  Having bad headaches which resolve with ibuprofen and Tylenol.  Sleeping more.  Denies fevers.  Continues to take Methadone on PRN basis, about 1-2 times daily. He asked if [name] should see him, I replied I would check with her and follow-up with him.  Gayleen Orem, RN, BSN, [place] at [place] [phone]         Time Spent with Patient: 15 (12/11/15 1014)

## 2015-12-12 ENCOUNTER — Encounter: Payer: Self-pay | Admitting: Hematology and Oncology

## 2015-12-12 ENCOUNTER — Ambulatory Visit (HOSPITAL_BASED_OUTPATIENT_CLINIC_OR_DEPARTMENT_OTHER): Payer: PPO | Admitting: Hematology and Oncology

## 2015-12-12 VITALS — BP 137/79 | HR 85 | Temp 98.2°F | Resp 18 | Ht 69.0 in | Wt 230.3 lb

## 2015-12-12 DIAGNOSIS — R221 Localized swelling, mass and lump, neck: Secondary | ICD-10-CM

## 2015-12-12 DIAGNOSIS — J0110 Acute frontal sinusitis, unspecified: Secondary | ICD-10-CM

## 2015-12-12 DIAGNOSIS — M549 Dorsalgia, unspecified: Secondary | ICD-10-CM

## 2015-12-12 DIAGNOSIS — C01 Malignant neoplasm of base of tongue: Secondary | ICD-10-CM

## 2015-12-12 DIAGNOSIS — M542 Cervicalgia: Secondary | ICD-10-CM | POA: Diagnosis not present

## 2015-12-12 DIAGNOSIS — G8929 Other chronic pain: Secondary | ICD-10-CM

## 2015-12-12 DIAGNOSIS — C029 Malignant neoplasm of tongue, unspecified: Secondary | ICD-10-CM

## 2015-12-12 MED ORDER — METHADONE HCL 10 MG PO TABS: 10.0000 mg | ORAL_TABLET | Freq: Three times a day (TID) | ORAL | Status: DC

## 2015-12-12 MED ORDER — AMOXICILLIN-POT CLAVULANATE 875-125 MG PO TABS: 1.0000 | ORAL_TABLET | Freq: Two times a day (BID) | ORAL | Status: DC

## 2015-12-12 NOTE — Assessment & Plan Note (Signed)
Clinically, he had recurrent acute frontal sinusitis which I think is the cause of the nasal drainage, congestion, headache and sore throat. I recommend 1 week course of oral antibiotic therapy along with Nasacort inhaler as needed to reduce nasal drainage and he can continue over-the-counter remedies.

## 2015-12-12 NOTE — Assessment & Plan Note (Signed)
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 I reassured the patient and recommend he continues exercise therapy for lymphedema.

## 2015-12-12 NOTE — Assessment & Plan Note (Signed)
He is concerned that his symptoms of throat and neck swelling is related to cancer recurrence. Clinically, I do not feel that this is related to cancer. We will wait for PET CT scan next month and in the meantime we'll treat him with a course of oral antibiotic therapy for acute bacterial infection.

## 2015-12-12 NOTE — Assessment & Plan Note (Signed)
He has chronic neck pain. His pain is well controlled with methadone half a tablet 3 times a day  He has not needed to take Dilaudid as needed I refilled his prescription today. We discussed refill policy

## 2015-12-12 NOTE — Progress Notes (Signed)
[facility] OFFICE PROGRESS NOTE  Patient Care Team: [name], MD as PCP - General (Family Medicine) Leota Sauers, RN as Oncology Nurse Navigator [name], MD as Consulting Physician (Hematology and Oncology) Eppie Gibson, MD as Attending Physician (Radiation Oncology) Karie Mainland, RD as Dietitian (Nutrition)  SUMMARY OF ONCOLOGIC HISTORY:   Tongue cancer ([place])   04/10/2015 Imaging CT Neck with contrast:  L base of tongue SCC with regional adenopathy; suspected metastatic disease to C2, T2.   04/13/2015 Initial Biopsy Accession [id number]:  Lymph node, needle/core biopsy - SCC, p16 positive.   04/18/2015 Initial Diagnosis Tongue cancer   04/24/2015 Imaging PET CT showed tongue cancer, lung nodule and possible bone mets   04/26/2015 Surgery He had dental extractions   05/03/2015 Imaging CT neck with contrast: L base of tongue with regional adenopathy, metastatic disease C2, C3, T2 vertebra; posterior L fourth rib.   05/09/2015 Procedure Port-a-cath placed.   05/11/2015 - 05/23/2015 Radiation Therapy Palliative radiation therapy:  1) T1-T3 and Left 3rd posterior rib / 30 Gy in 10 fractions, 2) Base of tongue, neck, and Cspine / 30 Gy in 10 fractions.   06/01/2015 - 07/17/2015 Chemotherapy He received 2 cycles of carboplatin, 5-FU and cetuximab   06/04/2015 - 06/05/2015 Hospital Admission He was admitted to the hospital after syncopal episode and had right nondisplaced fibular fracture   07/20/2015 - 07/24/2015 Hospital Admission he was admitted to the hospital for weakness and uncontrolled nausea and dehydration   08/02/2015 Imaging PET CT scan showed positive response to treatment   08/04/2015 - 08/12/2015 Hospital Admission He was admitted to the hospital with sepsis, MRSA bacteremia and respiratory failure   08/07/2015 Surgery PAC removed r/t sepsis, MRSA  bacteremia.   10/15/2015 Imaging Ct scan of the neck, chest, abdomen and pelvis showed stable sclerotic lesions. No new disease  progression    INTERVAL HISTORY: Please see below for problem oriented charting.  he is seen urgently today because of concern about neck swelling. He has nasal congestion, frontal sinus pain, headaches and persistent sore throat. He also had mild skin rashes on his face. He denies fevers or chills. He continues to have chronic pain, stable on methadone.  REVIEW OF SYSTEMS:   Constitutional: Denies fevers, chills or abnormal weight loss Eyes: Denies blurriness of vision Respiratory: Denies cough, dyspnea or wheezes Cardiovascular: Denies palpitation, chest discomfort or lower extremity swelling Gastrointestinal:  Denies nausea, heartburn or change in bowel habits Lymphatics: Denies new lymphadenopathy or easy bruising Neurological:Denies numbness, tingling or new weaknesses Behavioral/Psych: Mood is stable, no new changes  All other systems were reviewed with the patient and are negative.  I have reviewed the past medical history, past surgical history, social history and family history with the patient and they are unchanged from previous note.  ALLERGIES:  is allergic to morphine and related.  MEDICATIONS:  Current Outpatient Prescriptions  Medication Sig Dispense Refill  . Armodafinil 250 MG tablet Take 250 mg by mouth daily.    Marland Kitchen aspirin EC 81 MG tablet Take 81 mg by mouth daily.    . fluticasone (FLONASE) 50 MCG/ACT nasal spray Place 2 sprays into both nostrils daily. 16 g 0  . gabapentin (NEURONTIN) 100 MG capsule Take 600 mg by mouth 2 (two) times daily. Takes with supper and at bedtime    . ibuprofen (ADVIL,MOTRIN) 200 MG tablet Take 600 mg by mouth every 6 (six) hours as needed for mild pain or moderate pain.     Marland Kitchen  lansoprazole (PREVACID) 30 MG capsule Take 30 mg by mouth daily at 12 noon.    . Magnesium Oxide 400 MG CAPS Take 1 capsule (400 mg total) by mouth daily. 30 capsule 0  . methadone (DOLOPHINE) 10 MG tablet Take 1 tablet (10 mg total) by mouth every 8 (eight)  hours. 90 tablet 0  . polyethylene glycol powder (GLYCOLAX/MIRALAX) powder Take 17 g by mouth 2 (two) times daily. 255 g 0  . amoxicillin-clavulanate (AUGMENTIN) 875-125 MG tablet Take 1 tablet by mouth 2 (two) times daily. 14 tablet 0   No current facility-administered medications for this visit.    PHYSICAL EXAMINATION: ECOG PERFORMANCE STATUS: 1 - Symptomatic but completely ambulatory  Filed Vitals:   12/12/15 1214  BP: 137/79  Pulse: 85  Temp: 98.2 F (36.8 C)  Resp: 18   Filed Weights   12/12/15 1214  Weight: 230 lb 4.8 oz (104.463 kg)    GENERAL:alert, no distress and comfortable SKIN:  Note a mild skin rash on his face, related to mild dermatitis EYES: normal, Conjunctiva are pink and non-injected, sclera clear OROPHARYNX:no exudate, no erythema and lips, buccal mucosa, and tongue normal  NECK:  Noted lymphedema around his neck without discrete masses. LYMPH:  no palpable lymphadenopathy in the cervical, axillary or inguinal LUNGS: clear to auscultation and percussion with normal breathing effort HEART: regular rate & rhythm and no murmurs and no lower extremity edema ABDOMEN:abdomen soft, non-tender and normal bowel sounds Musculoskeletal:no cyanosis of digits and no clubbing  NEURO: alert & oriented x 3 with fluent speech, no focal motor/sensory deficits  LABORATORY DATA:  I have reviewed the data as listed    Component Value Date/Time   NA 141 10/08/2015 1407   NA 139 10/01/2015 0957   K 4.5 10/08/2015 1407   K 4.3 10/01/2015 0957   CL 104 10/01/2015 0957   CO2 25 10/08/2015 1407   CO2 25 10/01/2015 0957   GLUCOSE 111 10/08/2015 1407   GLUCOSE 121* 10/01/2015 0957   BUN 22.8 10/08/2015 1407   BUN 23 10/01/2015 0957   CREATININE 1.3 10/08/2015 1407   CREATININE 1.22 10/01/2015 0957   CREATININE 1.01 08/12/2015 0244   CALCIUM 9.3 10/08/2015 1407   CALCIUM 9.2 10/01/2015 0957   PROT 7.0 10/08/2015 1407   PROT 7.0 08/12/2015 0244   ALBUMIN 3.7 10/08/2015  1407   ALBUMIN 2.9* 08/12/2015 0244   AST 15 10/08/2015 1407   AST 20 08/12/2015 0244   ALT 11 10/08/2015 1407   ALT 20 08/12/2015 0244   ALKPHOS 124 10/08/2015 1407   ALKPHOS 83 08/12/2015 0244   BILITOT 0.35 10/08/2015 1407   BILITOT 0.8 08/12/2015 0244   GFRNONAA >60 08/12/2015 0244   GFRAA >60 08/12/2015 0244    No results found for: SPEP, UPEP  Lab Results  Component Value Date   WBC 4.9 10/08/2015   NEUTROABS 3.7 10/08/2015   HGB 11.5* 10/08/2015   HCT 35.3* 10/08/2015   MCV 91.7 10/08/2015   PLT 176 10/08/2015      Chemistry      Component Value Date/Time   NA 141 10/08/2015 1407   NA 139 10/01/2015 0957   K 4.5 10/08/2015 1407   K 4.3 10/01/2015 0957   CL 104 10/01/2015 0957   CO2 25 10/08/2015 1407   CO2 25 10/01/2015 0957   BUN 22.8 10/08/2015 1407   BUN 23 10/01/2015 0957   CREATININE 1.3 10/08/2015 1407   CREATININE 1.22 10/01/2015 0957   CREATININE 1.01  08/12/2015 0244      Component Value Date/Time   CALCIUM 9.3 10/08/2015 1407   CALCIUM 9.2 10/01/2015 0957   ALKPHOS 124 10/08/2015 1407   ALKPHOS 83 08/12/2015 0244   AST 15 10/08/2015 1407   AST 20 08/12/2015 0244   ALT 11 10/08/2015 1407   ALT 20 08/12/2015 0244   BILITOT 0.35 10/08/2015 1407   BILITOT 0.8 08/12/2015 0244     ASSESSMENT & PLAN:  Acute frontal sinusitis  Clinically, he had recurrent acute frontal sinusitis which I think is the cause of the nasal drainage, congestion, headache and sore throat. I recommend 1 week course of oral antibiotic therapy along with Nasacort inhaler as needed to reduce nasal drainage and he can continue over-the-counter remedies.   Tongue cancer He is concerned that his symptoms of throat and neck swelling is related to cancer recurrence. Clinically, I do not feel that this is related to cancer. We will wait for PET CT scan next month and in the meantime we'll treat him with a course of oral antibiotic therapy for acute bacterial  infection.  Chronic neck and back pain He has chronic neck pain. His pain is well controlled with methadone half a tablet 3 times a day  He has not needed to take Dilaudid as needed I refilled his prescription today. 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 I reassured the patient and recommend he continues exercise therapy for lymphedema.   Orders Placed This Encounter  Procedures  . CBC with Differential/Platelet    Standing Status: Future     Number of Occurrences:      Standing Expiration Date: 01/15/2017   All questions were answered. The patient knows to call the clinic with any problems, questions or concerns. No barriers to learning was detected. I spent 15 minutes counseling the patient face to face. The total time spent in the appointment was 20 minutes and more than 50% was on counseling and review of test results     [facility], [place], MD 12/12/2015 2:07 PM

## 2015-12-13 ENCOUNTER — Telehealth: Payer: Self-pay | Admitting: *Deleted

## 2015-12-13 NOTE — Telephone Encounter (Signed)
  Oncology Nurse Navigator Documentation Navigator Location: [facility] (12/13/15 [id number]) Navigator Encounter Type: Telephone (12/13/15 [id number])             Treatment Phase: Post-Tx Follow-up (12/13/15 [id number]) Barriers/Navigation Needs: Coordination of Care (12/13/15 [id number])                Acuity: Level 1 (12/13/15 [id number]) Acuity Level 1: Initial guidance, education and coordination as needed;Minimal follow up required (12/13/15 [id number])   In follow-up to Mr [id number] appt with [name] yesterday, called him to discuss his interest in arranging PT lymphedema appt.  LVMM with this information, asked him to return my call.  Gayleen Orem, RN, BSN, [place] at [place] [phone]         Time Spent with Patient: 15 (12/13/15 [id number])

## 2015-12-13 NOTE — Telephone Encounter (Signed)
  Oncology Nurse Navigator Documentation Navigator Location: [facility] (12/13/15 [id number]) Navigator Encounter Type: Telephone (12/13/15 0955) Telephone: Incoming Call (12/13/15 0955)      Mr Shane Cantu returned by call re his interest in PT lymphedema follow-up.  He indicated he has not been diligent with conducting the massage tmts for which he received instruction by Shane Cantu, PT, when he saw her in June of last year.    He agreed to begin BID neck massage during the upcoming weeks and expects he will see some improvement.   I indicated I will contact him in the next couple of weeks to follow-up his need for PT lymphedema referral.  He verbalized agreement with this plan. I will route this information to [name].  Gayleen Orem, RN, BSN, [place] at [place] [phone]   .                                       Time Spent with Patient: 15 (12/13/15 0955)

## 2016-01-01 ENCOUNTER — Encounter: Payer: Self-pay | Admitting: Infectious Disease

## 2016-01-01 ENCOUNTER — Telehealth: Payer: Self-pay | Admitting: *Deleted

## 2016-01-01 NOTE — Telephone Encounter (Signed)
  Oncology Nurse Navigator Documentation  Navigator Location: [facility] (01/01/16 1454) Navigator Encounter Type: Telephone (01/01/16 1454) Telephone: Incoming Call;Appt Confirmation/Clarification (01/01/16 1454)           Treatment Phase: Post-Tx Follow-up (01/01/16 1454)     Shane Cantu called to confirm his upcoming appts.  I provided information.  Gayleen Orem, RN, BSN, [place] at [place] [phone]                           Time Spent with Patient: 15 (01/01/16 1454)

## 2016-01-07 ENCOUNTER — Ambulatory Visit (HOSPITAL_COMMUNITY): Payer: Self-pay | Admitting: Dentistry

## 2016-01-07 ENCOUNTER — Encounter (HOSPITAL_COMMUNITY): Payer: Self-pay | Admitting: Dentistry

## 2016-01-07 VITALS — BP 148/86 | HR 65 | Temp 97.6°F

## 2016-01-07 DIAGNOSIS — K0820 Unspecified atrophy of edentulous alveolar ridge: Secondary | ICD-10-CM

## 2016-01-07 DIAGNOSIS — K08109 Complete loss of teeth, unspecified cause, unspecified class: Secondary | ICD-10-CM

## 2016-01-07 DIAGNOSIS — C01 Malignant neoplasm of base of tongue: Secondary | ICD-10-CM

## 2016-01-07 DIAGNOSIS — Z463 Encounter for fitting and adjustment of dental prosthetic device: Secondary | ICD-10-CM

## 2016-01-07 DIAGNOSIS — K117 Disturbances of salivary secretion: Secondary | ICD-10-CM

## 2016-01-07 DIAGNOSIS — R682 Dry mouth, unspecified: Secondary | ICD-10-CM

## 2016-01-07 NOTE — Patient Instructions (Signed)
Patient to keep dentures out if sore spots develop. Use salt water rinses as needed to aid healing. Return to clinic as scheduled for denture adjustment.   Call if problems arise before then.  Ronald F. Kulinski, DDS  

## 2016-01-07 NOTE — Progress Notes (Signed)
01/07/2016  Patient Name:   Shane Cantu Date of Birth:   04/24/1956 Medical Record Number: [id number]  BP 148/86 mmHg  Pulse 65  Temp(Src) 97.6 F (36.4 C) (Oral)   Aaron Mose [place] presents for evaluation of upper and lower complete dentures. SUBJECTIVE: Patient is not complaining of any irritation.   OBJECTIVE: There is no evidence of denture irritation or ulceration.  The patient has xerostomia. Procedure: Pressure indicating paste was applied to the dentures. Adjustments were made as needed. [place] Occlusion evaluated and adjustments made as needed for Centric Relation and protrusive strokes. Patient has End to End and anterior occlusion with bilateral posterior crossbite. Patient accepts results. Patient to keep dentures out if sore spots develop. Use salt water rinses as needed to aid healing. Return to clinic as scheduled for denture adjustment.   Call if problems arise before then.  [name], DDS

## 2016-01-09 ENCOUNTER — Telehealth: Payer: Self-pay | Admitting: *Deleted

## 2016-01-09 NOTE — Telephone Encounter (Signed)
  Oncology Nurse Navigator Documentation  Navigator Location: [facility] (01/09/16 0945) Navigator Encounter Type: Telephone (01/09/16 0945) Telephone: Incoming Call;Symptom Mgt (01/09/16 0945)           Treatment Phase: Post-Tx Follow-up (01/09/16 0945)     Interventions: Coordination of Care (01/09/16 0945)     Shane Cantu called to report symptoms of concern:  Bilateral neck swelling, L > R  Bilateral ear pain, L > R; pain rated 5/10. L neck muscle tension that radiates to L shoulder.  Pain back of head.    Taking Tylenol and Ibuprofen for pain control; finding some relief also with OTC sinus medication.  Also taking methadone for head/neck pain but minimal relief. He indicated these are similar symptoms he reported to [name] when he saw her 1/11.  He completed abx she prescribed but symptoms have not resolved, he feels they have worsened. He understands he has an appt with [name] next Tuesday but feels he needs more immediate evaluation. I indicated I will notify Alvy Bimler, call him with her guidance.  Gayleen Orem, RN, BSN, [place] at [place] [phone]                     Time Spent with Patient: 15 (01/09/16 0945)

## 2016-01-09 NOTE — Telephone Encounter (Signed)
  Oncology Nurse Navigator Documentation  Navigator Location: [facility] (01/09/16 1318) Navigator Encounter Type: Telephone (01/09/16 1318) Telephone: Outgoing Call;Symptom Mgt (01/09/16 1318)           Treatment Phase: Post-Tx Follow-up (01/09/16 1318)     Interventions: Coordination of Care (01/09/16 1318)       Spoke with Mr Fishbaugh, provided guidance for pain mgt per [name]:  Increase methadone from 1/2 TAB to 1 TAB TID, take dilaudid PRN.  He confirmed he has only been taking 1/2 TAB methadone and not on a scheduled basis, has sufficienct dilaudid.   He understands that results of Monday's PET will facilitate understanding of pain sources.  I asked him to begin 1 TAB methadone TID this afternoon, to contact me no later than noon tomorrow to provide update.  He understands [name] can see him Friday if necessary.  Gayleen Orem, RN, BSN, [place] at [place] [phone]                   Time Spent with Patient: 15 (01/09/16 1318)

## 2016-01-10 ENCOUNTER — Other Ambulatory Visit: Payer: Self-pay | Admitting: *Deleted

## 2016-01-10 ENCOUNTER — Telehealth: Payer: Self-pay | Admitting: Hematology and Oncology

## 2016-01-10 ENCOUNTER — Telehealth: Payer: Self-pay | Admitting: *Deleted

## 2016-01-10 NOTE — Telephone Encounter (Signed)
  Oncology Nurse Navigator Documentation  Navigator Location: [facility] (01/10/16 1405) Navigator Encounter Type: Telephone (01/10/16 1405) Telephone: Incoming Call;Symptom Mgt (01/10/16 1405)                 Interventions: Coordination of Care (01/10/16 1405)       Shane Cantu called to report that his L neck pain persists despite adjusting pain medication regime as directed per  [name].  He asks to be seen tomorrow. I sent Urgent POF to Scheduling for a 10:45 appt tomorrow per yesterday's guidance from [name].  Gayleen Orem, RN, BSN, [place] at [place] [phone]                   Time Spent with Patient: 15 (01/10/16 1405)

## 2016-01-10 NOTE — Telephone Encounter (Signed)
cld pt  and adv of appt time & date of appt on 2/10-pt understood-per pof

## 2016-01-11 ENCOUNTER — Ambulatory Visit (HOSPITAL_BASED_OUTPATIENT_CLINIC_OR_DEPARTMENT_OTHER): Payer: PPO | Admitting: Hematology and Oncology

## 2016-01-11 ENCOUNTER — Encounter: Payer: Self-pay | Admitting: Hematology and Oncology

## 2016-01-11 VITALS — BP 142/74 | HR 66 | Temp 97.8°F | Resp 18 | Ht 69.0 in | Wt 230.8 lb

## 2016-01-11 DIAGNOSIS — R221 Localized swelling, mass and lump, neck: Secondary | ICD-10-CM

## 2016-01-11 DIAGNOSIS — G8929 Other chronic pain: Secondary | ICD-10-CM

## 2016-01-11 DIAGNOSIS — M542 Cervicalgia: Secondary | ICD-10-CM | POA: Diagnosis not present

## 2016-01-11 DIAGNOSIS — C01 Malignant neoplasm of base of tongue: Secondary | ICD-10-CM

## 2016-01-11 DIAGNOSIS — R5382 Chronic fatigue, unspecified: Secondary | ICD-10-CM | POA: Insufficient documentation

## 2016-01-11 DIAGNOSIS — C029 Malignant neoplasm of tongue, unspecified: Secondary | ICD-10-CM

## 2016-01-11 DIAGNOSIS — M549 Dorsalgia, unspecified: Secondary | ICD-10-CM

## 2016-01-11 HISTORY — DX: Chronic fatigue, unspecified: R53.82

## 2016-01-11 NOTE — Assessment & Plan Note (Signed)
Examination review some thickness on the skin over the left side with supraclavicular lymphadenopathy. Overall presentation is suspicious for disease recurrence. I recommend we keep his appointment next week for PET CT scan and we will discuss further treatment options if confirmed disease recurrence. I gave him patient education handout to read about Pembrolizumab and Nivolumab

## 2016-01-11 NOTE — Progress Notes (Signed)
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   04/13/2015 Initial Biopsy Accession [id number]  Lymph node, needle/core biopsy - SCC, p16 positive.   04/18/2015 Initial Diagnosis Tongue cancer   04/24/2015 Imaging PET CT showed tongue cancer, lung nodule and possible bone mets   04/26/2015 Surgery He had dental extractions   05/03/2015 Imaging CT neck with contrast: L base of tongue with regional adenopathy, metastatic disease C2, C3, T2 vertebra; posterior L fourth rib.   05/09/2015 Procedure Port-a-cath placed.   05/11/2015 - 05/23/2015 Radiation Therapy Palliative radiation therapy:  1) T1-T3 and Left 3rd posterior rib / 30 Gy in 10 fractions, 2) Base of tongue, neck, and Cspine / 30 Gy in 10 fractions.   06/01/2015 - 07/17/2015 Chemotherapy He received 2 cycles of carboplatin, 5-FU and cetuximab   06/04/2015 - 06/05/2015 Hospital Admission He was admitted to the hospital after syncopal episode and had right nondisplaced fibular fracture   07/20/2015 - 07/24/2015 Hospital Admission he was admitted to the hospital for weakness and uncontrolled nausea and dehydration   08/02/2015 Imaging PET CT scan showed positive response to treatment   08/04/2015 - 08/12/2015 Hospital Admission He was admitted to the hospital with sepsis, MRSA bacteremia and respiratory failure   08/07/2015 Surgery PAC removed r/t sepsis, MRSA  bacteremia.   10/15/2015 Imaging Ct scan of the neck, chest, abdomen and pelvis showed stable sclerotic lesions. No new disease  progression    INTERVAL HISTORY: Please see below for problem oriented charting. He requests urgent consult because of concern about swelling behind the left ear and the left neck. He noticed about a week ago. His pain was poorly controlled and I gave him verbal instruction to increase methadone to 3 times a day. It is better control with increased pain medicine and he rated his pain 5 out of 10. He denies dysphagia. No recent anorexia or weight loss. No recent fevers or chills.  REVIEW OF SYSTEMS:   Constitutional: Denies fevers, chills or abnormal weight loss Eyes: Denies blurriness of vision Ears, nose, mouth, throat, and face: Denies mucositis or sore throat Respiratory: Denies cough, dyspnea or wheezes Cardiovascular: Denies palpitation, chest discomfort or lower extremity swelling Gastrointestinal:  Denies nausea, heartburn or change in bowel habits Skin: Denies abnormal skin rashes Neurological:Denies numbness, tingling or new weaknesses Behavioral/Psych: Mood is stable, no new changes  All other systems were reviewed with the patient and are negative.  I have reviewed the past medical history, past surgical history, social history and family history with the patient and they are unchanged from previous note.  ALLERGIES:  is allergic to morphine and related.  MEDICATIONS:  Current Outpatient Prescriptions  Medication Sig Dispense Refill  . Armodafinil 250 MG tablet Take 250 mg by mouth daily.    Marland Kitchen aspirin EC 81 MG tablet Take 81 mg by mouth daily.    . fluticasone (FLONASE) 50 MCG/ACT nasal spray Place 2 sprays into both nostrils daily. 16 g 0  . gabapentin (NEURONTIN) 100 MG capsule Take 600 mg by mouth 2 (two) times  daily. Takes with supper and at bedtime    . HYDROmorphone (DILAUDID) 4 MG tablet Take by mouth every 4 (four) hours as needed for severe pain.    Marland Kitchen ibuprofen (ADVIL,MOTRIN) 200 MG tablet Take 600 mg by mouth every 6 (six) hours as needed for mild pain or  moderate pain.     Marland Kitchen lansoprazole (PREVACID) 30 MG capsule Take 30 mg by mouth daily at 12 noon.    . Magnesium Oxide 400 MG CAPS Take 1 capsule (400 mg total) by mouth daily. 30 capsule 0  . methadone (DOLOPHINE) 10 MG tablet Take 1 tablet (10 mg total) by mouth every 8 (eight) hours. 90 tablet 0  . polyethylene glycol powder (GLYCOLAX/MIRALAX) powder Take 17 g by mouth 2 (two) times daily. 255 g 0   No current facility-administered medications for this visit.    PHYSICAL EXAMINATION: ECOG PERFORMANCE STATUS: 1 - Symptomatic but completely ambulatory  Filed Vitals:   01/11/16 1044  BP: 142/74  Pulse: 66  Temp: 97.8 F (36.6 C)  Resp: 18   Filed Weights   01/11/16 1044  Weight: 230 lb 12.8 oz (104.69 kg)    GENERAL:alert, no distress and comfortable SKIN: skin color, texture, turgor are normal, no rashes or significant lesions EYES: normal, Conjunctiva are pink and non-injected, sclera clear OROPHARYNX:no exudate, no erythema and lips, buccal mucosa, and tongue normal  NECK: noticed thickness on the skin around the left cervical region with associated lymphadenopathy behind the left ear and the supraclavicular region. Noted associated lymphedema from prior treatment LYMPH:  no palpable lymphadenopathy in the cervical, axillary or inguinal. There is palpable lymphadenopathy in the left supraclavicular region which is new LUNGS: clear to auscultation and percussion with normal breathing effort HEART: regular rate & rhythm and no murmurs and no lower extremity edema ABDOMEN:abdomen soft, non-tender and normal bowel sounds Musculoskeletal:no cyanosis of digits and no clubbing  NEURO: alert & oriented x 3 with fluent speech, no focal motor/sensory deficits  LABORATORY DATA:  I have reviewed the data as listed    Component Value Date/Time   NA 141 10/08/2015 1407   NA 139 10/01/2015 0957   K 4.5 10/08/2015 1407   K 4.3 10/01/2015 0957   CL 104 10/01/2015 0957   CO2 25  10/08/2015 1407   CO2 25 10/01/2015 0957   GLUCOSE 111 10/08/2015 1407   GLUCOSE 121* 10/01/2015 0957   BUN 22.8 10/08/2015 1407   BUN 23 10/01/2015 0957   CREATININE 1.3 10/08/2015 1407   CREATININE 1.22 10/01/2015 0957   CREATININE 1.01 08/12/2015 0244   CALCIUM 9.3 10/08/2015 1407   CALCIUM 9.2 10/01/2015 0957   PROT 7.0 10/08/2015 1407   PROT 7.0 08/12/2015 0244   ALBUMIN 3.7 10/08/2015 1407   ALBUMIN 2.9* 08/12/2015 0244   AST 15 10/08/2015 1407   AST 20 08/12/2015 0244   ALT 11 10/08/2015 1407   ALT 20 08/12/2015 0244   ALKPHOS 124 10/08/2015 1407   ALKPHOS 83 08/12/2015 0244   BILITOT 0.35 10/08/2015 1407   BILITOT 0.8 08/12/2015 0244   GFRNONAA >60 08/12/2015 0244   GFRAA >60 08/12/2015 0244    No results found for: SPEP, UPEP  Lab Results  Component Value Date   WBC 4.9 10/08/2015   NEUTROABS 3.7 10/08/2015   HGB 11.5* 10/08/2015   HCT 35.3* 10/08/2015   MCV 91.7 10/08/2015   PLT 176 10/08/2015      Chemistry      Component Value Date/Time  NA 141 10/08/2015 1407   NA 139 10/01/2015 0957   K 4.5 10/08/2015 1407   K 4.3 10/01/2015 0957   CL 104 10/01/2015 0957   CO2 25 10/08/2015 1407   CO2 25 10/01/2015 0957   BUN 22.8 10/08/2015 1407   BUN 23 10/01/2015 0957   CREATININE 1.3 10/08/2015 1407   CREATININE 1.22 10/01/2015 0957   CREATININE 1.01 08/12/2015 0244      Component Value Date/Time   CALCIUM 9.3 10/08/2015 1407   CALCIUM 9.2 10/01/2015 0957   ALKPHOS 124 10/08/2015 1407   ALKPHOS 83 08/12/2015 0244   AST 15 10/08/2015 1407   AST 20 08/12/2015 0244   ALT 11 10/08/2015 1407   ALT 20 08/12/2015 0244   BILITOT 0.35 10/08/2015 1407   BILITOT 0.8 08/12/2015 0244      ASSESSMENT & PLAN:  Tongue cancer Examination review some thickness on the skin over the left side with supraclavicular lymphadenopathy. Overall presentation is suspicious for disease recurrence. I recommend we keep his appointment next week for PET CT scan and we will  discuss further treatment options if confirmed disease recurrence. I gave him patient education handout to read about Pembrolizumab and Nivolumab   Chronic neck and back pain I gave him permission to increase methadone to 1 tablet 3 times a day along with Dilaudid. He felt that the pain behind his neck and the left side of the year is well controlled with the current regimen. He does not need prescription refill. We will follow with PET CT scan as above   Orders Placed This Encounter  Procedures  . T4, free    Standing Status: Future     Number of Occurrences:      Standing Expiration Date: 02/14/2017  . TSH    Standing Status: Future     Number of Occurrences:      Standing Expiration Date: 02/14/2017   All questions were answered. The patient knows to call the clinic with any problems, questions or concerns. No barriers to learning was detected. I spent 15 minutes counseling the patient face to face. The total time spent in the appointment was 20 minutes and more than 50% was on counseling and review of test results     [facility], [place], MD 01/11/2016 12:31 PM

## 2016-01-11 NOTE — Assessment & Plan Note (Signed)
I gave him permission to increase methadone to 1 tablet 3 times a day along with Dilaudid. He felt that the pain behind his neck and the left side of the year is well controlled with the current regimen. He does not need prescription refill. We will follow with PET CT scan as above

## 2016-01-14 ENCOUNTER — Encounter (HOSPITAL_COMMUNITY)
Admission: RE | Admit: 2016-01-14 | Discharge: 2016-01-14 | Disposition: A | Discharge status: Home / Self Care | Payer: PPO | Source: Ambulatory Visit | Attending: Hematology and Oncology | Admitting: Hematology and Oncology

## 2016-01-14 ENCOUNTER — Other Ambulatory Visit (HOSPITAL_BASED_OUTPATIENT_CLINIC_OR_DEPARTMENT_OTHER): Payer: PPO

## 2016-01-14 DIAGNOSIS — C01 Malignant neoplasm of base of tongue: Secondary | ICD-10-CM | POA: Insufficient documentation

## 2016-01-14 DIAGNOSIS — R5382 Chronic fatigue, unspecified: Secondary | ICD-10-CM | POA: Diagnosis not present

## 2016-01-14 DIAGNOSIS — C7951 Secondary malignant neoplasm of bone: Secondary | ICD-10-CM | POA: Insufficient documentation

## 2016-01-14 DIAGNOSIS — C029 Malignant neoplasm of tongue, unspecified: Secondary | ICD-10-CM

## 2016-01-14 LAB — CBC WITH DIFFERENTIAL/PLATELET
BASO%: 0.7 % (ref 0.0–2.0)
Basophils Absolute: 0 10*3/uL (ref 0.0–0.1)
EOS%: 1.5 % (ref 0.0–7.0)
Eosinophils Absolute: 0.1 10*3/uL (ref 0.0–0.5)
HCT: 41.4 % (ref 38.4–49.9)
HEMOGLOBIN: 13.6 g/dL (ref 13.0–17.1)
LYMPH#: 0.6 10*3/uL — AB (ref 0.9–3.3)
LYMPH%: 9.6 % — AB (ref 14.0–49.0)
MCH: 28.8 pg (ref 27.2–33.4)
MCHC: 32.9 g/dL (ref 32.0–36.0)
MCV: 87.6 fL (ref 79.3–98.0)
MONO#: 0.5 10*3/uL (ref 0.1–0.9)
MONO%: 7.6 % (ref 0.0–14.0)
NEUT%: 80.6 % — ABNORMAL HIGH (ref 39.0–75.0)
NEUTROS ABS: 5.3 10*3/uL (ref 1.5–6.5)
PLATELETS: 198 10*3/uL (ref 140–400)
RBC: 4.73 10*6/uL (ref 4.20–5.82)
RDW: 14.6 % (ref 11.0–14.6)
WBC: 6.6 10*3/uL (ref 4.0–10.3)

## 2016-01-14 LAB — COMPREHENSIVE METABOLIC PANEL
ALT: 15 U/L (ref 0–55)
AST: 19 U/L (ref 5–34)
Albumin: 4.2 g/dL (ref 3.5–5.0)
Alkaline Phosphatase: 143 U/L (ref 40–150)
Anion Gap: 11 mEq/L (ref 3–11)
BUN: 25.3 mg/dL (ref 7.0–26.0)
CALCIUM: 9.6 mg/dL (ref 8.4–10.4)
CHLORIDE: 104 meq/L (ref 98–109)
CO2: 26 meq/L (ref 22–29)
CREATININE: 1.7 mg/dL — AB (ref 0.7–1.3)
EGFR: 44 mL/min/{1.73_m2} — ABNORMAL LOW (ref >90)
Glucose: 100 mg/dl (ref 70–140)
Potassium: 4.4 mEq/L (ref 3.5–5.1)
Sodium: 140 mEq/L (ref 136–145)
Total Bilirubin: 0.78 mg/dL (ref 0.20–1.20)
Total Protein: 7.6 g/dL (ref 6.4–8.3)

## 2016-01-14 LAB — TSH: TSH: 0.89 m(IU)/L (ref 0.320–4.118)

## 2016-01-14 LAB — GLUCOSE, CAPILLARY: GLUCOSE-CAPILLARY: 95 mg/dL (ref 65–99)

## 2016-01-14 MED ORDER — FLUDEOXYGLUCOSE F - 18 (FDG) INJECTION
10.6000 | Freq: Once | INTRAVENOUS | Status: AC | PRN
  Administered 2016-01-14: 10.6 via INTRAVENOUS

## 2016-01-15 ENCOUNTER — Telehealth: Payer: Self-pay | Admitting: *Deleted

## 2016-01-15 ENCOUNTER — Encounter: Payer: Self-pay | Admitting: Hematology and Oncology

## 2016-01-15 ENCOUNTER — Encounter: Payer: Self-pay | Admitting: *Deleted

## 2016-01-15 ENCOUNTER — Telehealth: Payer: Self-pay | Admitting: Hematology and Oncology

## 2016-01-15 ENCOUNTER — Ambulatory Visit (HOSPITAL_BASED_OUTPATIENT_CLINIC_OR_DEPARTMENT_OTHER): Payer: PPO | Admitting: Hematology and Oncology

## 2016-01-15 VITALS — BP 165/80 | HR 66 | Temp 97.9°F | Resp 18 | Ht 69.0 in | Wt 224.5 lb

## 2016-01-15 DIAGNOSIS — C7951 Secondary malignant neoplasm of bone: Secondary | ICD-10-CM | POA: Diagnosis not present

## 2016-01-15 DIAGNOSIS — C01 Malignant neoplasm of base of tongue: Secondary | ICD-10-CM

## 2016-01-15 DIAGNOSIS — M542 Cervicalgia: Secondary | ICD-10-CM

## 2016-01-15 DIAGNOSIS — C770 Secondary and unspecified malignant neoplasm of lymph nodes of head, face and neck: Secondary | ICD-10-CM | POA: Diagnosis not present

## 2016-01-15 DIAGNOSIS — C029 Malignant neoplasm of tongue, unspecified: Secondary | ICD-10-CM

## 2016-01-15 DIAGNOSIS — M549 Dorsalgia, unspecified: Secondary | ICD-10-CM

## 2016-01-15 DIAGNOSIS — G8929 Other chronic pain: Secondary | ICD-10-CM

## 2016-01-15 LAB — T4, FREE: T4,Free(Direct): 1.19 ng/dL (ref 0.82–1.77)

## 2016-01-15 MED ORDER — METHADONE HCL 10 MG PO TABS: 10.0000 mg | ORAL_TABLET | Freq: Three times a day (TID) | ORAL | Status: DC

## 2016-01-15 NOTE — Assessment & Plan Note (Signed)
Role of treatment is palliative and it is a recommendation according to NCCN guidelines  On July 06, 2015, the [place] Food and Drug Administration granted accelerated approval to pembrolizumab for the treatment of patients with recurrent or metastatic head and neck squamous cell carcinoma (HNSCC) with disease progression on or after platinum-containing chemotherapy.   The approval was based on demonstration of a durable objective response rate (ORR) in a subgroup of patients in an international, multicenter, non-randomized, open-label, multi-cohort study. This subgroup included 174 patients with recurrent or metastatic HNSCC who had disease progression on or after platinum-containing chemotherapy. Patients received intravenous pembrolizumab 10 mg/kg every 2 weeks or 200 mg every 3 weeks.   ORR was determined by an independent review committee according to Response Evaluation Criteria in Solid Tumors (RECIST) 1.1. The ORR for these 174 patients was 16% (95% confidence interval [CI] 11, 22). The median response duration had not been reached at the time of analysis. The range for duration of response was 2.4 months to 27.7 months (response ongoing). Among the 28 responding patients, 23 (82%) had responses of 6 months or longer.   Safety data was evaluated in 192 patients with HNSCC receiving at least one dose of pembrolizumab 10 mg/kg every 2 weeks or 200 mg every 3 weeks. The most common (greater than or equal to 20%) adverse reactions were fatigue, decreased appetite, and dyspnea. Adverse reactions occurring in patients with HNSCC were similar to those occurring in patients with melanoma or NSCLC, with the exception of an increased incidence of facial edema (10% all grades, 2.1% grades 3-4) and new or worsening hypothyroidism (14.6% all grades). The most frequent (greater than or equal to 2%) serious adverse reactions were pneumonia, dyspnea, confusional state, vomiting, pleural effusion, and respiratory  failure. Clinically significant immune-mediated adverse reactions included pneumonitis, colitis, hepatitis, adrenal insufficiency, diabetes mellitus, skin toxicity, myositis, and thyroid disorders.   The recommended dose and schedule of pembrolizumab for this indication is 200 mg administered as an intravenous infusion over 30 minutes every 3 weeks.   We discussed some of the risks, benefits, side-effects of Pembrolizumab  Some of the short term side-effects included, though not limited to, risk of fatigue, pancytopenia, life-threatening infections, allergic reactions, nausea, vomiting, hepatitis, changes in bowel habits especially diarrhea, admission to hospital for various reasons, and risks of death.   The patient is aware that the response rates discussed earlier is not guaranteed.    After a long discussion, patient made an informed decision to proceed.   Patient education material was dispensed.

## 2016-01-15 NOTE — Assessment & Plan Note (Signed)
I gave him permission to increase methadone to 1 tablet 3 times a day along with Dilaudid. He felt that the pain behind his neck and the left side of the year is well controlled with the current regimen.

## 2016-01-15 NOTE — Assessment & Plan Note (Signed)
He had metastatic cancer to the bone but is not symptomatic. His chronic neck pain is unrelated.

## 2016-01-15 NOTE — Progress Notes (Signed)
[facility] OFFICE PROGRESS NOTE  Patient Care Team: [name], MD as PCP - General (Family Medicine) Leota Sauers, RN as Oncology Nurse Navigator [name], MD as Consulting Physician (Hematology and Oncology) Eppie Gibson, MD as Attending Physician (Radiation Oncology) Karie Mainland, RD as Dietitian (Nutrition)  SUMMARY OF ONCOLOGIC HISTORY:   Tongue cancer ([place])   04/10/2015 Imaging CT Neck with contrast:  L base of tongue SCC with regional adenopathy; suspected metastatic disease to C2, T2.   04/13/2015 Initial Biopsy Accession [id number]:  Lymph node, needle/core biopsy - SCC, p16 positive.   04/18/2015 Initial Diagnosis Tongue cancer   04/24/2015 Imaging PET CT showed tongue cancer, lung nodule and possible bone mets   04/26/2015 Surgery He had dental extractions   05/03/2015 Imaging CT neck with contrast: L base of tongue with regional adenopathy, metastatic disease C2, C3, T2 vertebra; posterior L fourth rib.   05/09/2015 Procedure Port-a-cath placed.   05/11/2015 - 05/23/2015 Radiation Therapy Palliative radiation therapy:  1) T1-T3 and Left 3rd posterior rib / 30 Gy in 10 fractions, 2) Base of tongue, neck, and Cspine / 30 Gy in 10 fractions.   06/01/2015 - 07/17/2015 Chemotherapy He received 2 cycles of carboplatin, 5-FU and cetuximab   06/04/2015 - 06/05/2015 Hospital Admission He was admitted to the hospital after syncopal episode and had right nondisplaced fibular fracture   07/20/2015 - 07/24/2015 Hospital Admission he was admitted to the hospital for weakness and uncontrolled nausea and dehydration   08/02/2015 Imaging PET CT scan showed positive response to treatment   08/04/2015 - 08/12/2015 Hospital Admission He was admitted to the hospital with sepsis, MRSA bacteremia and respiratory failure   08/07/2015 Surgery PAC removed r/t sepsis, MRSA  bacteremia.   10/15/2015 Imaging Ct scan of the neck, chest, abdomen and pelvis showed stable sclerotic lesions. No new disease  progression   01/14/2016 Imaging PET scan showed new hypermetabolic left supraclavicular nodes. Progression of osseous metastasis.     INTERVAL HISTORY: Please see below for problem oriented charting.  he returns for further follow-up. He continues to have chronic neck pain , well controlled with methadone and Dilaudid. He denies any new back pain.  REVIEW OF SYSTEMS:   Constitutional: Denies fevers, chills or abnormal weight loss Eyes: Denies blurriness of vision Ears, nose, mouth, throat, and face: Denies mucositis or sore throat Respiratory: Denies cough, dyspnea or wheezes Cardiovascular: Denies palpitation, chest discomfort or lower extremity swelling Gastrointestinal:  Denies nausea, heartburn or change in bowel habits Skin: Denies abnormal skin rashes Lymphatics: Denies new lymphadenopathy or easy bruising Neurological:Denies numbness, tingling or new weaknesses Behavioral/Psych: Mood is stable, no new changes  All other systems were reviewed with the patient and are negative.  I have reviewed the past medical history, past surgical history, social history and family history with the patient and they are unchanged from previous note.  ALLERGIES:  is allergic to morphine and related.  MEDICATIONS:  Current Outpatient Prescriptions  Medication Sig Dispense Refill  . Armodafinil 250 MG tablet Take 250 mg by mouth daily.    Marland Kitchen aspirin EC 81 MG tablet Take 81 mg by mouth daily.    . fluticasone (FLONASE) 50 MCG/ACT nasal spray Place 2 sprays into both nostrils daily. 16 g 0  . gabapentin (NEURONTIN) 100 MG capsule Take 600 mg by mouth 2 (two) times daily. Takes with supper and at bedtime    . HYDROmorphone (DILAUDID) 4 MG tablet Take by mouth every 4 (four) hours  as needed for severe pain.    Marland Kitchen ibuprofen (ADVIL,MOTRIN) 200 MG tablet Take 600 mg by mouth every 6 (six) hours as needed for mild pain or moderate pain.     Marland Kitchen lansoprazole (PREVACID) 30 MG capsule Take 30 mg by mouth  daily at 12 noon.    . Magnesium Oxide 400 MG CAPS Take 1 capsule (400 mg total) by mouth daily. 30 capsule 0  . methadone (DOLOPHINE) 10 MG tablet Take 1 tablet (10 mg total) by mouth every 8 (eight) hours. 90 tablet 0  . polyethylene glycol powder (GLYCOLAX/MIRALAX) powder Take 17 g by mouth 2 (two) times daily. 255 g 0   No current facility-administered medications for this visit.    PHYSICAL EXAMINATION: ECOG PERFORMANCE STATUS: 1 - Symptomatic but completely ambulatory  Filed Vitals:   01/15/16 1215  BP: 165/80  Pulse: 66  Temp: 97.9 F (36.6 C)  Resp: 18   Filed Weights   01/15/16 1215  Weight: 224 lb 8 oz (101.833 kg)    GENERAL:alert, no distress and comfortable SKIN: skin color, texture, turgor are normal, no rashes or significant lesions EYES: normal, Conjunctiva are pink and non-injected, sclera clear OROPHARYNX:no exudate, no erythema and lips, buccal mucosa, and tongue normal  LYMPH:   He has persistent palpable left supraclavicular lymph node Musculoskeletal:no cyanosis of digits and no clubbing  NEURO: alert & oriented x 3 with fluent speech, no focal motor/sensory deficits  LABORATORY DATA:  I have reviewed the data as listed    Component Value Date/Time   NA 140 01/14/2016 1242   NA 139 10/01/2015 0957   K 4.4 01/14/2016 1242   K 4.3 10/01/2015 0957   CL 104 10/01/2015 0957   CO2 26 01/14/2016 1242   CO2 25 10/01/2015 0957   GLUCOSE 100 01/14/2016 1242   GLUCOSE 121* 10/01/2015 0957   BUN 25.3 01/14/2016 1242   BUN 23 10/01/2015 0957   CREATININE 1.7* 01/14/2016 1242   CREATININE 1.22 10/01/2015 0957   CREATININE 1.01 08/12/2015 0244   CALCIUM 9.6 01/14/2016 1242   CALCIUM 9.2 10/01/2015 0957   PROT 7.6 01/14/2016 1242   PROT 7.0 08/12/2015 0244   ALBUMIN 4.2 01/14/2016 1242   ALBUMIN 2.9* 08/12/2015 0244   AST 19 01/14/2016 1242   AST 20 08/12/2015 0244   ALT 15 01/14/2016 1242   ALT 20 08/12/2015 0244   ALKPHOS 143 01/14/2016 1242    ALKPHOS 83 08/12/2015 0244   BILITOT 0.78 01/14/2016 1242   BILITOT 0.8 08/12/2015 0244   GFRNONAA >60 08/12/2015 0244   GFRAA >60 08/12/2015 0244    No results found for: SPEP, UPEP  Lab Results  Component Value Date   WBC 6.6 01/14/2016   NEUTROABS 5.3 01/14/2016   HGB 13.6 01/14/2016   HCT 41.4 01/14/2016   MCV 87.6 01/14/2016   PLT 198 01/14/2016      Chemistry      Component Value Date/Time   NA 140 01/14/2016 1242   NA 139 10/01/2015 0957   K 4.4 01/14/2016 1242   K 4.3 10/01/2015 0957   CL 104 10/01/2015 0957   CO2 26 01/14/2016 1242   CO2 25 10/01/2015 0957   BUN 25.3 01/14/2016 1242   BUN 23 10/01/2015 0957   CREATININE 1.7* 01/14/2016 1242   CREATININE 1.22 10/01/2015 0957   CREATININE 1.01 08/12/2015 0244      Component Value Date/Time   CALCIUM 9.6 01/14/2016 1242   CALCIUM 9.2 10/01/2015 0957   ALKPHOS  143 01/14/2016 1242   ALKPHOS 83 08/12/2015 0244   AST 19 01/14/2016 1242   AST 20 08/12/2015 0244   ALT 15 01/14/2016 1242   ALT 20 08/12/2015 0244   BILITOT 0.78 01/14/2016 1242   BILITOT 0.8 08/12/2015 0244       RADIOGRAPHIC STUDIES: I have personally reviewed the radiological images as listed and agreed with the findings in the report. Nm Pet Image Restag (ps) Skull Base To Thigh  01/14/2016  CLINICAL DATA:  Subsequent treatment strategy for carcinoma of base of tongue. Bone metastasis. Exclude recurrence. EXAM: NUCLEAR MEDICINE PET SKULL BASE TO THIGH TECHNIQUE: 10.6 mCi F-18 FDG was injected intravenously. Full-ring PET imaging was performed from the skull base to thigh after the radiotracer. CT data was obtained and used for attenuation correction and anatomic localization. FASTING BLOOD GLUCOSE:  Value: 82 mg/dl COMPARISON:  08/02/2015 FINDINGS: NECK No residual hypermetabolism within the tongue. No residual cervical nodal hypermetabolism. CHEST Multiple hypermetabolic left supraclavicular nodes. Nodes measure up to 8 mm and a S.U.V. max of 8.5  on image 39/series 4. Compare 6 mm and not hypermetabolic on the prior. ABDOMEN/PELVIS A focus of hypermetabolism corresponds to normal-appearing left paracentral abdominal small bowel. This measures a S.U.V. max of 17.9. Previously described porta hepatis node measures 1.0 cm and a S.U.V. max of 3.5 on image 112. Similar in size and hypermetabolism on the prior. SKELETON Progressive osseous metastasis since the prior PET. Expansile lytic lesion within the right fifth rib measures a S.U.V. max of 15.3 on image 87/series 4. This is newly hypermetabolic since the prior PET and newly expansile compared to 10/15/2015 diagnostic CT. Left-sided lower thoracic lesion measures a S.U.V. max of 10.5 and is new or markedly increased since the prior. CT IMAGES PERFORMED FOR ATTENUATION CORRECTION Right maxillary sinus mucous retention cyst or polyp. Mild cardiomegaly with LAD and right coronary artery atherosclerosis. Left nephrolithiasis. Jejunal mesenteric findings which are again suspicious for mesenteric adenitis/panniculitis. Mild prostatomegaly. IMPRESSION: 1. Mild disease progression. 2. Although the cervical nodal hypermetabolism has resolved, there are new hypermetabolic left supraclavicular nodes. 3. Progression of osseous metastasis. 4. A focus of hypermetabolism within mid small bowel is without CT correlate. Unlikely to be physiologic, given focality. An otherwise occult metastasis is a concern. Recommend attention on follow-up. 5. The porta hepatis node is similar in size and hypermetabolism and remains favored to be reactive. 6. Incidental findings, including coronary artery atherosclerosis and left nephrolithiasis. Electronically Signed   By: Abigail Miyamoto M.D.   On: 01/14/2016 15:48     ASSESSMENT & PLAN:  Tongue cancer Role of treatment is palliative and it is a recommendation according to NCCN guidelines  On July 06, 2015, the U. S. Food and Drug Administration granted accelerated approval to  pembrolizumab for the treatment of patients with recurrent or metastatic head and neck squamous cell carcinoma (HNSCC) with disease progression on or after platinum-containing chemotherapy.   The approval was based on demonstration of a durable objective response rate (ORR) in a subgroup of patients in an international, multicenter, non-randomized, open-label, multi-cohort study. This subgroup included 174 patients with recurrent or metastatic HNSCC who had disease progression on or after platinum-containing chemotherapy. Patients received intravenous pembrolizumab 10 mg/kg every 2 weeks or 200 mg every 3 weeks.   ORR was determined by an independent review committee according to Response Evaluation Criteria in Solid Tumors (RECIST) 1.1. The ORR for these 174 patients was 16% (95% confidence interval [CI] 11, 22). The median response duration  had not been reached at the time of analysis. The range for duration of response was 2.4 months to 27.7 months (response ongoing). Among the 28 responding patients, 23 (82%) had responses of 6 months or longer.   Safety data was evaluated in 192 patients with HNSCC receiving at least one dose of pembrolizumab 10 mg/kg every 2 weeks or 200 mg every 3 weeks. The most common (greater than or equal to 20%) adverse reactions were fatigue, decreased appetite, and dyspnea. Adverse reactions occurring in patients with HNSCC were similar to those occurring in patients with melanoma or NSCLC, with the exception of an increased incidence of facial edema (10% all grades, 2.1% grades 3-4) and new or worsening hypothyroidism (14.6% all grades). The most frequent (greater than or equal to 2%) serious adverse reactions were pneumonia, dyspnea, confusional state, vomiting, pleural effusion, and respiratory failure. Clinically significant immune-mediated adverse reactions included pneumonitis, colitis, hepatitis, adrenal insufficiency, diabetes mellitus, skin toxicity, myositis, and  thyroid disorders.   The recommended dose and schedule of pembrolizumab for this indication is 200 mg administered as an intravenous infusion over 30 minutes every 3 weeks.   We discussed some of the risks, benefits, side-effects of Pembrolizumab  Some of the short term side-effects included, though not limited to, risk of fatigue, pancytopenia, life-threatening infections, allergic reactions, nausea, vomiting, hepatitis, changes in bowel habits especially diarrhea, admission to hospital for various reasons, and risks of death.   The patient is aware that the response rates discussed earlier is not guaranteed.    After a long discussion, patient made an informed decision to proceed.   Patient education material was dispensed.   Chronic neck and back pain I gave him permission to increase methadone to 1 tablet 3 times a day along with Dilaudid. He felt that the pain behind his neck and the left side of the year is well controlled with the current regimen.   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)  He has palpable supraclavicular lymph node but asymptomatic. We will use as index node for measurement in the future  Metastasis to bone [facility])  He had metastatic cancer to the bone but is not symptomatic. His chronic neck pain is unrelated.   No orders of the defined types were placed in this encounter.   All questions were answered. The patient knows to call the clinic with any problems, questions or concerns. No barriers to learning was detected. I spent 30 minutes counseling the patient face to face. The total time spent in the appointment was 40 minutes and more than 50% was on counseling and review of test results     [facility], [place], MD 01/15/2016 2:15 PM

## 2016-01-15 NOTE — Assessment & Plan Note (Signed)
He has palpable supraclavicular lymph node but asymptomatic. We will use as index node for measurement in the future

## 2016-01-15 NOTE — Telephone Encounter (Signed)
per of to sch pt appt-sent MW email to sch trmt-pt to go on [place] for update

## 2016-01-15 NOTE — Telephone Encounter (Signed)
Per staff message and POF I have scheduled appts. Advised scheduler of appts. JMW  

## 2016-01-17 ENCOUNTER — Telehealth: Payer: Self-pay | Admitting: *Deleted

## 2016-01-17 NOTE — Telephone Encounter (Signed)
  Oncology Nurse Navigator Documentation  Navigator Location: [facility]-Med Onc (01/17/16 1000) Navigator Encounter Type: Telephone (01/17/16 1000) Telephone: Incoming Call (01/17/16 1000)                 Interventions: Coordination of Care (01/17/16 1000)     I received a VMM from Mr. Borth dtr late yesterday afternoon indicating she had additional questions regarding 2/13 PET report, would appreciate a call from [name].  Received a VMM from Mr Sanders this morning with a request for a 2nd opinion referral.  Requests forwarded to [name].  Gayleen Orem, RN, BSN, [place] at [place] [phone]                     Time Spent with Patient: 15 (01/17/16 1000)

## 2016-01-17 NOTE — Progress Notes (Addendum)
  Oncology Nurse Navigator Documentation  Navigator Location: [facility]Med Onc (01/15/16 1210) Navigator Encounter Type: Clinic/MDC (01/15/16 1210)           Patient Visit Type: MedOnc (01/15/16 1210)   Barriers/Navigation Needs: No barriers at this time (01/15/16 1210)       To provide support and encouragement, care continuity and to assess for needs, met with Shane Cantu during Est Pt appt with [name].  He was accompanied by [name].  He reported continuing L ear and neck pain, indicated pain is controlled with TID 10 mg methadone.  They voiced understanding of Dr. Calton Dach discussion of 2/13 PET results, accepted her proposal to proceed with q3wk Pembrolizumab. They verbalized understanding they can contact me with needs/concerns as moves forward with treatment.  Gayleen Orem, RN, BSN, [place] at [place] [phone]                       Time Spent with Patient: 30 (01/15/16 1210)

## 2016-01-18 ENCOUNTER — Other Ambulatory Visit: Payer: Self-pay | Admitting: Hematology and Oncology

## 2016-01-18 ENCOUNTER — Telehealth: Payer: Self-pay | Admitting: Hematology and Oncology

## 2016-01-18 NOTE — Telephone Encounter (Signed)
I spoke with the patient's daughter, Amy and discussed most recent PET CT scan report with her. She indicated that her father, the patient, did not understand that treatment is palliative in nature. She felt that she needed to spend some time talking to him further this weekend and if the patient still decided for second opinion, we will send a referral. She wants to keep his treatment as planned for next week

## 2016-01-22 ENCOUNTER — Other Ambulatory Visit: Payer: Self-pay

## 2016-01-22 ENCOUNTER — Ambulatory Visit (HOSPITAL_BASED_OUTPATIENT_CLINIC_OR_DEPARTMENT_OTHER): Payer: PPO

## 2016-01-22 ENCOUNTER — Encounter: Payer: Self-pay | Admitting: Hematology and Oncology

## 2016-01-22 VITALS — BP 124/68 | HR 59 | Temp 98.5°F | Resp 18

## 2016-01-22 DIAGNOSIS — C029 Malignant neoplasm of tongue, unspecified: Secondary | ICD-10-CM

## 2016-01-22 DIAGNOSIS — Z5111 Encounter for antineoplastic chemotherapy: Secondary | ICD-10-CM | POA: Diagnosis not present

## 2016-01-22 MED ORDER — SODIUM CHLORIDE 0.9 % IV SOLN
Freq: Once | INTRAVENOUS | Status: AC
  Administered 2016-01-22: 13:00:00 via INTRAVENOUS

## 2016-01-22 MED ORDER — SODIUM CHLORIDE 0.9 % IV SOLN
200.0000 mg | Freq: Once | INTRAVENOUS | Status: AC
  Administered 2016-01-22: 200 mg via INTRAVENOUS
  Filled 2016-01-22: qty 8

## 2016-01-22 NOTE — Patient Instructions (Signed)
[facility] Discharge Instructions for Patients Receiving Chemotherapy  Today you received the following chemotherapy agents Keytruda.  To help prevent nausea and vomiting after your treatment, we encourage you to take your nausea medication  As prescribed by your MD/.   If you develop nausea and vomiting that is not controlled by your nausea medication, call the clinic.   BELOW ARE SYMPTOMS THAT SHOULD BE REPORTED IMMEDIATELY:  *FEVER GREATER THAN 100.5 F  *CHILLS WITH OR WITHOUT FEVER  NAUSEA AND VOMITING THAT IS NOT CONTROLLED WITH YOUR NAUSEA MEDICATION  *UNUSUAL SHORTNESS OF BREATH  *UNUSUAL BRUISING OR BLEEDING  TENDERNESS IN MOUTH AND THROAT WITH OR WITHOUT PRESENCE OF ULCERS  *URINARY PROBLEMS  *BOWEL PROBLEMS  UNUSUAL RASH Items with * indicate a potential emergency and should be followed up as soon as possible.  Feel free to call the clinic you have any questions or concerns. The clinic phone number is [phone].  Please show the [facility] at check-in to the Emergency Department and triage nurse.   Pembrolizumab injection What is this medicine? PEMBROLIZUMAB (pem broe liz ue mab) is a monoclonal antibody. It is used to treat melanoma and non-small cell lung cancer. This medicine may be used for other purposes; ask your health care provider or pharmacist if you have questions. What should I tell my health care provider before I take this medicine? They need to know if you have any of these conditions: -diabetes -immune system problems -inflammatory bowel disease -liver disease -lung or breathing disease -lupus -an unusual or allergic reaction to pembrolizumab, other medicines, foods, dyes, or preservatives -pregnant or trying to get pregnant -breast-feeding How should I use this medicine? This medicine is for infusion into a vein. It is given by a health care professional in a hospital or clinic setting. A special MedGuide will be  given to you before each treatment. Be sure to read this information carefully each time. Talk to your pediatrician regarding the use of this medicine in children. Special care may be needed. Overdosage: If you think you have taken too much of this medicine contact a poison control center or emergency room at once. NOTE: This medicine is only for you. Do not share this medicine with others. What if I miss a dose? It is important not to miss your dose. Call your doctor or health care professional if you are unable to keep an appointment. What may interact with this medicine? Interactions have not been studied. Give your health care provider a list of all the medicines, herbs, non-prescription drugs, or dietary supplements you use. Also tell them if you smoke, drink alcohol, or use illegal drugs. Some items may interact with your medicine. This list may not describe all possible interactions. Give your health care provider a list of all the medicines, herbs, non-prescription drugs, or dietary supplements you use. Also tell them if you smoke, drink alcohol, or use illegal drugs. Some items may interact with your medicine. What should I watch for while using this medicine? Your condition will be monitored carefully while you are receiving this medicine. You may need blood work done while you are taking this medicine. Do not become pregnant while taking this medicine or for 4 months after stopping it. Women should inform their doctor if they wish to become pregnant or think they might be pregnant. There is a potential for serious side effects to an unborn child. Talk to your health care professional or pharmacist for more information. Do  not breast-feed an infant while taking this medicine or for 4 months after the last dose. What side effects may I notice from receiving this medicine? Side effects that you should report to your doctor or health care professional as soon as possible: -allergic reactions  like skin rash, itching or hives, swelling of the face, lips, or tongue -bloody or black, tarry stools -breathing problems -change in the amount of urine -changes in vision -chest pain -chills -dark urine -dizziness or feeling faint or lightheaded -fast or irregular heartbeat -fever -flushing -hair loss -muscle pain -muscle weakness -persistent headache -signs and symptoms of high blood sugar such as dizziness; dry mouth; dry skin; fruity breath; nausea; stomach pain; increased hunger or thirst; increased urination -signs and symptoms of liver injury like dark urine, light-colored stools, loss of appetite, nausea, right upper belly pain, yellowing of the eyes or skin -stomach pain -weight loss Side effects that usually do not require medical attention (Report these to your doctor or health care professional if they continue or are bothersome.):constipation -cough -diarrhea -joint pain -tiredness This list may not describe all possible side effects. Call your doctor for medical advice about side effects. You may report side effects to FDA at 1-800-FDA-1088. Where should I keep my medicine? This drug is given in a hospital or clinic and will not be stored at home. NOTE: This sheet is a summary. It may not cover all possible information. If you have questions about this medicine, talk to your doctor, pharmacist, or health care provider.    2016, [employer]vier/[employer] Standard. (2015-01-16 17:24:19)

## 2016-01-22 NOTE — Progress Notes (Signed)
Pt is approved for the $400 [facility] grant.  °

## 2016-01-24 ENCOUNTER — Telehealth: Payer: Self-pay | Admitting: Hematology and Oncology

## 2016-01-24 NOTE — Telephone Encounter (Signed)
Faxed pt medical records to [facility]. Scans and slides will be fedex'ed °

## 2016-02-12 ENCOUNTER — Ambulatory Visit (HOSPITAL_COMMUNITY): Payer: Self-pay | Admitting: Dentistry

## 2016-02-12 ENCOUNTER — Encounter: Payer: Self-pay | Admitting: Hematology and Oncology

## 2016-02-12 ENCOUNTER — Ambulatory Visit (HOSPITAL_BASED_OUTPATIENT_CLINIC_OR_DEPARTMENT_OTHER): Payer: PPO | Admitting: Hematology and Oncology

## 2016-02-12 ENCOUNTER — Ambulatory Visit (HOSPITAL_BASED_OUTPATIENT_CLINIC_OR_DEPARTMENT_OTHER): Payer: PPO

## 2016-02-12 ENCOUNTER — Encounter: Payer: Self-pay | Admitting: *Deleted

## 2016-02-12 ENCOUNTER — Other Ambulatory Visit (HOSPITAL_BASED_OUTPATIENT_CLINIC_OR_DEPARTMENT_OTHER): Payer: PPO

## 2016-02-12 ENCOUNTER — Encounter (HOSPITAL_COMMUNITY): Payer: Self-pay | Admitting: Dentistry

## 2016-02-12 VITALS — BP 165/86 | HR 59 | Temp 97.8°F

## 2016-02-12 VITALS — BP 154/88 | HR 57 | Temp 97.8°F | Resp 18 | Ht 69.0 in | Wt 221.4 lb

## 2016-02-12 DIAGNOSIS — C029 Malignant neoplasm of tongue, unspecified: Secondary | ICD-10-CM

## 2016-02-12 DIAGNOSIS — M542 Cervicalgia: Secondary | ICD-10-CM

## 2016-02-12 DIAGNOSIS — Z5112 Encounter for antineoplastic immunotherapy: Secondary | ICD-10-CM

## 2016-02-12 DIAGNOSIS — C01 Malignant neoplasm of base of tongue: Secondary | ICD-10-CM | POA: Diagnosis not present

## 2016-02-12 DIAGNOSIS — K117 Disturbances of salivary secretion: Secondary | ICD-10-CM

## 2016-02-12 DIAGNOSIS — Z463 Encounter for fitting and adjustment of dental prosthetic device: Secondary | ICD-10-CM

## 2016-02-12 DIAGNOSIS — G8929 Other chronic pain: Secondary | ICD-10-CM

## 2016-02-12 DIAGNOSIS — K0820 Unspecified atrophy of edentulous alveolar ridge: Secondary | ICD-10-CM

## 2016-02-12 DIAGNOSIS — C7951 Secondary malignant neoplasm of bone: Secondary | ICD-10-CM

## 2016-02-12 DIAGNOSIS — K08109 Complete loss of teeth, unspecified cause, unspecified class: Secondary | ICD-10-CM

## 2016-02-12 DIAGNOSIS — R682 Dry mouth, unspecified: Secondary | ICD-10-CM

## 2016-02-12 DIAGNOSIS — C770 Secondary and unspecified malignant neoplasm of lymph nodes of head, face and neck: Secondary | ICD-10-CM

## 2016-02-12 DIAGNOSIS — K062 Gingival and edentulous alveolar ridge lesions associated with trauma: Secondary | ICD-10-CM

## 2016-02-12 DIAGNOSIS — R7989 Other specified abnormal findings of blood chemistry: Secondary | ICD-10-CM

## 2016-02-12 LAB — COMPREHENSIVE METABOLIC PANEL
ALT: 12 U/L (ref 0–55)
AST: 17 U/L (ref 5–34)
Albumin: 3.9 g/dL (ref 3.5–5.0)
Alkaline Phosphatase: 128 U/L (ref 40–150)
Anion Gap: 9 mEq/L (ref 3–11)
BUN: 20.7 mg/dL (ref 7.0–26.0)
CALCIUM: 9.5 mg/dL (ref 8.4–10.4)
CHLORIDE: 104 meq/L (ref 98–109)
CO2: 26 mEq/L (ref 22–29)
CREATININE: 1.4 mg/dL — AB (ref 0.7–1.3)
EGFR: 54 mL/min/{1.73_m2} — ABNORMAL LOW (ref >90)
GLUCOSE: 99 mg/dL (ref 70–140)
Potassium: 4.1 mEq/L (ref 3.5–5.1)
Sodium: 140 mEq/L (ref 136–145)
Total Bilirubin: 0.57 mg/dL (ref 0.20–1.20)
Total Protein: 7.3 g/dL (ref 6.4–8.3)

## 2016-02-12 LAB — CBC WITH DIFFERENTIAL/PLATELET
BASO%: 0.6 % (ref 0.0–2.0)
BASOS ABS: 0 10*3/uL (ref 0.0–0.1)
EOS ABS: 0.2 10*3/uL (ref 0.0–0.5)
EOS%: 3.6 % (ref 0.0–7.0)
HCT: 41.4 % (ref 38.4–49.9)
HGB: 13.6 g/dL (ref 13.0–17.1)
LYMPH%: 12.2 % — AB (ref 14.0–49.0)
MCH: 29 pg (ref 27.2–33.4)
MCHC: 32.9 g/dL (ref 32.0–36.0)
MCV: 88.3 fL (ref 79.3–98.0)
MONO#: 0.5 10*3/uL (ref 0.1–0.9)
MONO%: 11.5 % (ref 0.0–14.0)
NEUT#: 3.4 10*3/uL (ref 1.5–6.5)
NEUT%: 72.1 % (ref 39.0–75.0)
PLATELETS: 191 10*3/uL (ref 140–400)
RBC: 4.69 10*6/uL (ref 4.20–5.82)
RDW: 13.7 % (ref 11.0–14.6)
WBC: 4.7 10*3/uL (ref 4.0–10.3)
lymph#: 0.6 10*3/uL — ABNORMAL LOW (ref 0.9–3.3)
nRBC: 0 % (ref 0–0)

## 2016-02-12 MED ORDER — SODIUM CHLORIDE 0.9 % IV SOLN
Freq: Once | INTRAVENOUS | Status: AC
  Administered 2016-02-12: 12:00:00 via INTRAVENOUS

## 2016-02-12 MED ORDER — SODIUM CHLORIDE 0.9 % IV SOLN
200.0000 mg | Freq: Once | INTRAVENOUS | Status: AC
  Administered 2016-02-12: 200 mg via INTRAVENOUS
  Filled 2016-02-12: qty 8

## 2016-02-12 MED ORDER — PREDNISONE 20 MG PO TABS: 20.0000 mg | ORAL_TABLET | Freq: Every day | ORAL | Status: DC

## 2016-02-12 MED ORDER — HYDROMORPHONE HCL 4 MG PO TABS: 4.0000 mg | ORAL_TABLET | ORAL | Status: DC | PRN

## 2016-02-12 NOTE — Assessment & Plan Note (Signed)
He is responding to treatment with regression in the size of the metastatic cancer to lymph node. I will continue cycle 2 without dose adjustment. I present to repeat imaging study after cycle 3

## 2016-02-12 NOTE — Progress Notes (Signed)
[facility] OFFICE PROGRESS NOTE  Patient Care Team: [name], MD as PCP - General (Family Medicine) Leota Sauers, RN as Oncology Nurse Navigator [name], MD as Consulting Physician (Hematology and Oncology) Eppie Gibson, MD as Attending Physician (Radiation Oncology) Karie Mainland, RD as Dietitian (Nutrition)  SUMMARY OF ONCOLOGIC HISTORY:   Tongue cancer ([place])   04/10/2015 Imaging CT Neck with contrast:  L base of tongue SCC with regional adenopathy; suspected metastatic disease to C2, T2.   04/13/2015 Initial Biopsy Accession [id number]:  Lymph node, needle/core biopsy - SCC, p16 positive.   04/18/2015 Initial Diagnosis Tongue cancer   04/24/2015 Imaging PET CT showed tongue cancer, lung nodule and possible bone mets   04/26/2015 Surgery He had dental extractions   05/03/2015 Imaging CT neck with contrast: L base of tongue with regional adenopathy, metastatic disease C2, C3, T2 vertebra; posterior L fourth rib.   05/09/2015 Procedure Port-a-cath placed.   05/11/2015 - 05/23/2015 Radiation Therapy Palliative radiation therapy:  1) T1-T3 and Left 3rd posterior rib / 30 Gy in 10 fractions, 2) Base of tongue, neck, and Cspine / 30 Gy in 10 fractions.   06/01/2015 - 07/17/2015 Chemotherapy He received 2 cycles of carboplatin, 5-FU and cetuximab   06/04/2015 - 06/05/2015 Hospital Admission He was admitted to the hospital after syncopal episode and had right nondisplaced fibular fracture   07/20/2015 - 07/24/2015 Hospital Admission he was admitted to the hospital for weakness and uncontrolled nausea and dehydration   08/02/2015 Imaging PET CT scan showed positive response to treatment   08/04/2015 - 08/12/2015 Hospital Admission He was admitted to the hospital with sepsis, MRSA bacteremia and respiratory failure   08/07/2015 Surgery PAC removed r/t sepsis, MRSA  bacteremia.   10/15/2015 Imaging Ct scan of the neck, chest, abdomen and pelvis showed stable sclerotic lesions. No new disease  progression   01/14/2016 Imaging PET scan showed new hypermetabolic left supraclavicular nodes. Progression of osseous metastasis.     INTERVAL HISTORY: Please see below for problem oriented charting. He is seen today prior to cycle 2 of treatment. He tolerated last treatment well. He continues have chronic neck pain and mild pain over the left side of the ear, well controlled with current pain medicine. He continues to have mild sinus congestion  REVIEW OF SYSTEMS:   Constitutional: Denies fevers, chills or abnormal weight loss Eyes: Denies blurriness of vision Ears, nose, mouth, throat, and face: Denies mucositis or sore throat Respiratory: Denies cough, dyspnea or wheezes Cardiovascular: Denies palpitation, chest discomfort or lower extremity swelling Gastrointestinal:  Denies nausea, heartburn or change in bowel habits Skin: Denies abnormal skin rashes Lymphatics: Denies new lymphadenopathy or easy bruising Neurological:Denies numbness, tingling or new weaknesses Behavioral/Psych: Mood is stable, no new changes  All other systems were reviewed with the patient and are negative.  I have reviewed the past medical history, past surgical history, social history and family history with the patient and they are unchanged from previous note.  ALLERGIES:  is allergic to morphine and related.  MEDICATIONS:  Current Outpatient Prescriptions  Medication Sig Dispense Refill  . Armodafinil 250 MG tablet Take 250 mg by mouth daily.    Marland Kitchen aspirin EC 81 MG tablet Take 81 mg by mouth daily.    . fluticasone (FLONASE) 50 MCG/ACT nasal spray Place 2 sprays into both nostrils daily. 16 g 0  . gabapentin (NEURONTIN) 100 MG capsule Take 600 mg by mouth 2 (two) times daily. Takes with supper and  at bedtime    . HYDROmorphone (DILAUDID) 4 MG tablet Take 1 tablet (4 mg total) by mouth every 4 (four) hours as needed for severe pain. 90 tablet 0  . ibuprofen (ADVIL,MOTRIN) 200 MG tablet Take 600 mg by  mouth every 6 (six) hours as needed for mild pain or moderate pain.     Marland Kitchen lansoprazole (PREVACID) 30 MG capsule Take 30 mg by mouth daily at 12 noon.    . Magnesium Oxide 400 MG CAPS Take 1 capsule (400 mg total) by mouth daily. 30 capsule 0  . methadone (DOLOPHINE) 10 MG tablet Take 1 tablet (10 mg total) by mouth every 8 (eight) hours. 90 tablet 0  . polyethylene glycol powder (GLYCOLAX/MIRALAX) powder Take 17 g by mouth 2 (two) times daily. 255 g 0  . predniSONE (DELTASONE) 20 MG tablet Take 1 tablet (20 mg total) by mouth daily with breakfast. 10 tablet 0   No current facility-administered medications for this visit.   Facility-Administered Medications Ordered in Other Visits  Medication Dose Route Frequency Provider Last Rate Last Dose  . pembrolizumab (KEYTRUDA) 200 mg in sodium chloride 0.9 % 50 mL chemo infusion  200 mg Intravenous Once [name], MD 116 mL/hr at 02/12/16 1236 200 mg at 02/12/16 1236    PHYSICAL EXAMINATION: ECOG PERFORMANCE STATUS: 1 - Symptomatic but completely ambulatory  Filed Vitals:   02/12/16 1107  BP: 154/88  Pulse: 57  Temp: 97.8 F (36.6 C)  Resp: 18   Filed Weights   02/12/16 1107  Weight: 221 lb 6.4 oz (100.426 kg)    GENERAL:alert, no distress and comfortable SKIN: skin color, texture, turgor are normal, no rashes or significant lesions EYES: normal, Conjunctiva are pink and non-injected, sclera clear OROPHARYNX:no exudate, no erythema and lips, buccal mucosa, and tongue normal  NECK: supple, thyroid normal size, non-tender, without nodularity LYMPH: Previously palpable lymphadenopathy has regressed in size LUNGS: clear to auscultation and percussion with normal breathing effort HEART: regular rate & rhythm and no murmurs and no lower extremity edema ABDOMEN:abdomen soft, non-tender and normal bowel sounds Musculoskeletal:no cyanosis of digits and no clubbing  NEURO: alert & oriented x 3 with fluent speech, no focal motor/sensory  deficits  LABORATORY DATA:  I have reviewed the data as listed    Component Value Date/Time   NA 140 02/12/2016 1056   NA 139 10/01/2015 0957   K 4.1 02/12/2016 1056   K 4.3 10/01/2015 0957   CL 104 10/01/2015 0957   CO2 26 02/12/2016 1056   CO2 25 10/01/2015 0957   GLUCOSE 99 02/12/2016 1056   GLUCOSE 121* 10/01/2015 0957   BUN 20.7 02/12/2016 1056   BUN 23 10/01/2015 0957   CREATININE 1.4* 02/12/2016 1056   CREATININE 1.22 10/01/2015 0957   CREATININE 1.01 08/12/2015 0244   CALCIUM 9.5 02/12/2016 1056   CALCIUM 9.2 10/01/2015 0957   PROT 7.3 02/12/2016 1056   PROT 7.0 08/12/2015 0244   ALBUMIN 3.9 02/12/2016 1056   ALBUMIN 2.9* 08/12/2015 0244   AST 17 02/12/2016 1056   AST 20 08/12/2015 0244   ALT 12 02/12/2016 1056   ALT 20 08/12/2015 0244   ALKPHOS 128 02/12/2016 1056   ALKPHOS 83 08/12/2015 0244   BILITOT 0.57 02/12/2016 1056   BILITOT 0.8 08/12/2015 0244   GFRNONAA >60 08/12/2015 0244   GFRAA >60 08/12/2015 0244    No results found for: SPEP, UPEP  Lab Results  Component Value Date   WBC 4.7 02/12/2016   NEUTROABS  3.4 02/12/2016   HGB 13.6 02/12/2016   HCT 41.4 02/12/2016   MCV 88.3 02/12/2016   PLT 191 02/12/2016      Chemistry      Component Value Date/Time   NA 140 02/12/2016 1056   NA 139 10/01/2015 0957   K 4.1 02/12/2016 1056   K 4.3 10/01/2015 0957   CL 104 10/01/2015 0957   CO2 26 02/12/2016 1056   CO2 25 10/01/2015 0957   BUN 20.7 02/12/2016 1056   BUN 23 10/01/2015 0957   CREATININE 1.4* 02/12/2016 1056   CREATININE 1.22 10/01/2015 0957   CREATININE 1.01 08/12/2015 0244      Component Value Date/Time   CALCIUM 9.5 02/12/2016 1056   CALCIUM 9.2 10/01/2015 0957   ALKPHOS 128 02/12/2016 1056   ALKPHOS 83 08/12/2015 0244   AST 17 02/12/2016 1056   AST 20 08/12/2015 0244   ALT 12 02/12/2016 1056   ALT 20 08/12/2015 0244   BILITOT 0.57 02/12/2016 1056   BILITOT 0.8 08/12/2015 0244      ASSESSMENT & PLAN:  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 I will continue cycle 2 without dose adjustment. I present to repeat imaging study after cycle 3  Chronic neck pain He will continue taking methadone to 1 tablet 3 times a day along with Dilaudid. He felt that the pain behind his neck and the left side of the ear is well controlled with the current regimen. I recommend addition of mild dose prednisone for left ear pain I refill his prescription of Dilaudid today    Elevated serum creatinine This is stable, likely related to prior treatment. Observe only.   Orders Placed This Encounter  Procedures  . CBC with Differential/Platelet    Standing Status: Standing     Number of Occurrences: 9     Standing Expiration Date: 02/11/2017   All questions were answered. The patient knows to call the clinic with any problems, questions or concerns. No barriers to learning was detected. I spent 20 minutes counseling the patient face to face. The total time spent in the appointment was 25 minutes and more than 50% was on counseling and review of test results     [facility], [place], MD 02/12/2016 12:47 PM

## 2016-02-12 NOTE — Progress Notes (Signed)
02/12/2016  Patient Name:   Shane Cantu Date of Birth:   29-Dec-1955 Medical Record Number: [id number]  BP 165/86 mmHg  Pulse 59  Temp(Src) 97.8 F (36.6 C) (Oral)   [name] presents for evaluation of mandibular right anterior lingual denture irritation.  Patient recently diagnosed with bone metastases in February 2017. Patient is receiving IV Keytruda immunotherapy every 3 weeks and is followed by [name].  SUBJECTIVE: Patient is complaining of denture irritation involving the lower right anterior lingual vestibule for one week. Patient has use salt water rinses to aid healing with minimal help.. OBJECTIVE: There is denture irritation and erythema involving the lower right lingual area #26-27.  The patient has xerostomia. Procedure: Pressure indicating paste was applied to the dentures. Adjustments were made as needed. Thick PIP applied to lingual border and further adjustments made as needed.  [place] Occlusion evaluated and adjustments made as needed for Centric Relation and protrusive strokes. 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 Patient accepts results. Patient to keep dentures out for two days as needed to allow for healing of the sore spots. Use salt water rinses to aid healing. Return to clinic as scheduled for reevaluation and denture adjustment.   Call if problems arise before then.  [name], DDS

## 2016-02-12 NOTE — Progress Notes (Signed)
  Oncology Nurse Navigator Documentation  Navigator Location: [facility]-Med Onc (02/12/16 1120)             Patient Visit Type: MedOnc (02/12/16 1120) Treatment Phase: Treatment (02/12/16 1120) Barriers/Navigation Needs: No barriers at this time (02/12/16 1120)   Interventions: None required (02/12/16 1120)            Acuity: Level 2 (02/12/16 1120)   Acuity Level 2: Ongoing guidance and education throughout treatment as needed (02/12/16 1120)     Met with Mr. Kretsch during Established Patient appt with [name].  He was accompanied by [name].  He continues to respond well to [facility]. He understands per his conversation with [name] that he will have another tmt next month (this will be #3) and then have another PET to gauge level of response. He did not express any needs or concerns at this time, I encouraged him to contact me if that changes, he verbalized understanding.  Gayleen Orem, RN, BSN, [place] [facility] [place] [phone]     Time Spent with Patient: 15 (02/12/16 1120)

## 2016-02-12 NOTE — Patient Instructions (Addendum)
Patient to keep dentures out for 2 days as needed for healing of the sore spots. Use salt water rinses as needed to further aid healing. Return to clinic as scheduled for denture adjustment.   Call if problems arise before then.  [name], DDS

## 2016-02-12 NOTE — Assessment & Plan Note (Signed)
He will continue taking methadone to 1 tablet 3 times a day along with Dilaudid. He felt that the pain behind his neck and the left side of the ear is well controlled with the current regimen. I recommend addition of mild dose prednisone for left ear pain I refill his prescription of Dilaudid today

## 2016-02-12 NOTE — Assessment & Plan Note (Signed)
This is stable, likely related to prior treatment. Observe only.

## 2016-02-12 NOTE — Patient Instructions (Signed)
[facility] Discharge Instructions for Patients Receiving Chemotherapy  Today you received the following chemotherapy agents: Keytruda.  To help prevent nausea and vomiting after your treatment, we encourage you to take your nausea medication: as needed.   If you develop nausea and vomiting that is not controlled by your nausea medication, call the clinic.   BELOW ARE SYMPTOMS THAT SHOULD BE REPORTED IMMEDIATELY:  *FEVER GREATER THAN 100.5 F  *CHILLS WITH OR WITHOUT FEVER  NAUSEA AND VOMITING THAT IS NOT CONTROLLED WITH YOUR NAUSEA MEDICATION  *UNUSUAL SHORTNESS OF BREATH  *UNUSUAL BRUISING OR BLEEDING  TENDERNESS IN MOUTH AND THROAT WITH OR WITHOUT PRESENCE OF ULCERS  *URINARY PROBLEMS  *BOWEL PROBLEMS  UNUSUAL RASH Items with * indicate a potential emergency and should be followed up as soon as possible.  Feel free to call the clinic you have any questions or concerns. The clinic phone number is [phone].  Please show the CHEMO ALERT CARD at check-in to the Emergency Department and triage nurse.   

## 2016-02-19 ENCOUNTER — Encounter (HOSPITAL_COMMUNITY): Payer: Self-pay | Admitting: Dentistry

## 2016-02-19 ENCOUNTER — Ambulatory Visit (HOSPITAL_COMMUNITY): Payer: Self-pay | Admitting: Dentistry

## 2016-02-19 VITALS — BP 149/78 | HR 86 | Temp 97.7°F

## 2016-02-19 DIAGNOSIS — K117 Disturbances of salivary secretion: Secondary | ICD-10-CM

## 2016-02-19 DIAGNOSIS — K062 Gingival and edentulous alveolar ridge lesions associated with trauma: Secondary | ICD-10-CM

## 2016-02-19 DIAGNOSIS — R682 Dry mouth, unspecified: Secondary | ICD-10-CM

## 2016-02-19 DIAGNOSIS — Z923 Personal history of irradiation: Secondary | ICD-10-CM

## 2016-02-19 DIAGNOSIS — Z463 Encounter for fitting and adjustment of dental prosthetic device: Secondary | ICD-10-CM

## 2016-02-19 DIAGNOSIS — K08109 Complete loss of teeth, unspecified cause, unspecified class: Secondary | ICD-10-CM

## 2016-02-19 DIAGNOSIS — K0820 Unspecified atrophy of edentulous alveolar ridge: Secondary | ICD-10-CM

## 2016-02-19 DIAGNOSIS — C7951 Secondary malignant neoplasm of bone: Secondary | ICD-10-CM

## 2016-02-19 DIAGNOSIS — C01 Malignant neoplasm of base of tongue: Secondary | ICD-10-CM

## 2016-02-19 NOTE — Patient Instructions (Signed)
Patient to keep dentures out as needed for sore spots. Use salt water rinses to aid healing. Return to clinic as scheduled for reevaluation and denture adjustment.   Call if problems arise before then.  [name], DDS

## 2016-02-19 NOTE — Progress Notes (Signed)
02/19/2016  Patient Name:   Shane Cantu Date of Birth:   [birth year] Medical Record Number: [id number]  BP 149/78 mmHg  Pulse 86  Temp(Src) 97.7 F (36.5 C) (Oral)   Aaron Mose Kalafut presents for re-evaluation of mandibular right anterior lingual denture irritation.  Patient recently diagnosed with bone metastases in February 2017. Patient is receiving IV Keytruda immunotherapy every 3 weeks and is followed by [name].  SUBJECTIVE: Patient is NOT complaining of any denture irritation. OBJECTIVE: There is no evidence of denture irritation or erythema.  The patient has persistent xerostomia. Procedure: Pressure indicating paste was applied to the dentures. Minimal adjustments were made as needed. [place] Occlusion evaluated and No adjustments made. 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 Patient accepts results. Patient to keep dentures out as needed for sore spots. Use salt water rinses to aid healing. Return to clinic as scheduled for reevaluation and denture adjustment.   Call if problems arise before then.  [name], DDS

## 2016-03-03 ENCOUNTER — Other Ambulatory Visit: Payer: Self-pay

## 2016-03-03 DIAGNOSIS — C029 Malignant neoplasm of tongue, unspecified: Secondary | ICD-10-CM

## 2016-03-04 ENCOUNTER — Ambulatory Visit (HOSPITAL_BASED_OUTPATIENT_CLINIC_OR_DEPARTMENT_OTHER): Payer: PPO | Admitting: Hematology and Oncology

## 2016-03-04 ENCOUNTER — Ambulatory Visit (HOSPITAL_BASED_OUTPATIENT_CLINIC_OR_DEPARTMENT_OTHER): Payer: PPO

## 2016-03-04 ENCOUNTER — Encounter: Payer: Self-pay | Admitting: Hematology and Oncology

## 2016-03-04 ENCOUNTER — Other Ambulatory Visit (HOSPITAL_BASED_OUTPATIENT_CLINIC_OR_DEPARTMENT_OTHER): Payer: PPO

## 2016-03-04 VITALS — BP 168/72 | HR 73 | Temp 98.3°F | Resp 18 | Wt 224.4 lb

## 2016-03-04 DIAGNOSIS — C770 Secondary and unspecified malignant neoplasm of lymph nodes of head, face and neck: Secondary | ICD-10-CM

## 2016-03-04 DIAGNOSIS — C029 Malignant neoplasm of tongue, unspecified: Secondary | ICD-10-CM

## 2016-03-04 DIAGNOSIS — C01 Malignant neoplasm of base of tongue: Secondary | ICD-10-CM | POA: Diagnosis not present

## 2016-03-04 DIAGNOSIS — C7951 Secondary malignant neoplasm of bone: Secondary | ICD-10-CM

## 2016-03-04 DIAGNOSIS — Z5112 Encounter for antineoplastic immunotherapy: Secondary | ICD-10-CM | POA: Diagnosis not present

## 2016-03-04 DIAGNOSIS — M542 Cervicalgia: Secondary | ICD-10-CM

## 2016-03-04 DIAGNOSIS — G8929 Other chronic pain: Secondary | ICD-10-CM

## 2016-03-04 DIAGNOSIS — Z79899 Other long term (current) drug therapy: Secondary | ICD-10-CM | POA: Diagnosis not present

## 2016-03-04 LAB — CBC WITH DIFFERENTIAL/PLATELET
BASO%: 0.5 % (ref 0.0–2.0)
BASOS ABS: 0 10*3/uL (ref 0.0–0.1)
EOS ABS: 0.1 10*3/uL (ref 0.0–0.5)
EOS%: 1.2 % (ref 0.0–7.0)
HCT: 41.6 % (ref 38.4–49.9)
HEMOGLOBIN: 13.5 g/dL (ref 13.0–17.1)
LYMPH%: 7 % — AB (ref 14.0–49.0)
MCH: 28.5 pg (ref 27.2–33.4)
MCHC: 32.5 g/dL (ref 32.0–36.0)
MCV: 87.9 fL (ref 79.3–98.0)
MONO#: 0.5 10*3/uL (ref 0.1–0.9)
MONO%: 8 % (ref 0.0–14.0)
NEUT#: 5.1 10*3/uL (ref 1.5–6.5)
NEUT%: 83.3 % — AB (ref 39.0–75.0)
Platelets: 168 10*3/uL (ref 140–400)
RBC: 4.74 10*6/uL (ref 4.20–5.82)
RDW: 14.6 % (ref 11.0–14.6)
WBC: 6.1 10*3/uL (ref 4.0–10.3)
lymph#: 0.4 10*3/uL — ABNORMAL LOW (ref 0.9–3.3)

## 2016-03-04 LAB — COMPREHENSIVE METABOLIC PANEL
ALT: 13 U/L (ref 0–55)
ANION GAP: 9 meq/L (ref 3–11)
AST: 15 U/L (ref 5–34)
Albumin: 3.6 g/dL (ref 3.5–5.0)
Alkaline Phosphatase: 136 U/L (ref 40–150)
BILIRUBIN TOTAL: 0.78 mg/dL (ref 0.20–1.20)
BUN: 24 mg/dL (ref 7.0–26.0)
CHLORIDE: 107 meq/L (ref 98–109)
CO2: 24 meq/L (ref 22–29)
Calcium: 9.5 mg/dL (ref 8.4–10.4)
Creatinine: 1.4 mg/dL — ABNORMAL HIGH (ref 0.7–1.3)
EGFR: 52 mL/min/{1.73_m2} — AB (ref >90)
GLUCOSE: 127 mg/dL (ref 70–140)
POTASSIUM: 4.1 meq/L (ref 3.5–5.1)
SODIUM: 141 meq/L (ref 136–145)
TOTAL PROTEIN: 7.1 g/dL (ref 6.4–8.3)

## 2016-03-04 LAB — TSH: TSH: 1.034 m[IU]/L (ref 0.320–4.118)

## 2016-03-04 MED ORDER — SODIUM CHLORIDE 0.9 % IV SOLN
Freq: Once | INTRAVENOUS | Status: AC
  Administered 2016-03-04: 13:00:00 via INTRAVENOUS

## 2016-03-04 MED ORDER — SODIUM CHLORIDE 0.9 % IV SOLN
200.0000 mg | Freq: Once | INTRAVENOUS | Status: AC
  Administered 2016-03-04: 200 mg via INTRAVENOUS
  Filled 2016-03-04: qty 8

## 2016-03-04 NOTE — Assessment & Plan Note (Signed)
He tolerated treatment well with mild regression in the size of the lymph node. I plan to repeat PET CT scan of the cycle 3 of therapy.

## 2016-03-04 NOTE — Assessment & Plan Note (Signed)
He is responding to treatment with regression in the size of the metastatic cancer to lymph node. I will continue cycle 3 without dose adjustment. I present to repeat imaging study after cycle 3

## 2016-03-04 NOTE — Patient Instructions (Signed)
[facility] Discharge Instructions for Patients Receiving Chemotherapy  Today you received the following chemotherapy agent, Keytruda.  To help prevent nausea and vomiting after your treatment, we encourage you to take your nausea medication as prescribed.    If you develop nausea and vomiting that is not controlled by your nausea medication, call the clinic.   BELOW ARE SYMPTOMS THAT SHOULD BE REPORTED IMMEDIATELY:  *FEVER GREATER THAN 100.5 F  *CHILLS WITH OR WITHOUT FEVER  NAUSEA AND VOMITING THAT IS NOT CONTROLLED WITH YOUR NAUSEA MEDICATION  *UNUSUAL SHORTNESS OF BREATH  *UNUSUAL BRUISING OR BLEEDING  TENDERNESS IN MOUTH AND THROAT WITH OR WITHOUT PRESENCE OF ULCERS  *URINARY PROBLEMS  *BOWEL PROBLEMS  UNUSUAL RASH Items with * indicate a potential emergency and should be followed up as soon as possible.  Feel free to call the clinic you have any questions or concerns. The clinic phone number is [phone].  Please show the CHEMO ALERT CARD at check-in to the Emergency Department and triage nurse.   

## 2016-03-04 NOTE — Progress Notes (Signed)
[place] OFFICE PROGRESS NOTE  Patient Care Team: [name], MD as PCP - General (Family Medicine) Leota Sauers, RN as Oncology Nurse Navigator [name], MD as Consulting Physician (Hematology and Oncology) Eppie Gibson, MD as Attending Physician (Radiation Oncology) Karie Mainland, RD as Dietitian (Nutrition)  SUMMARY OF ONCOLOGIC HISTORY:   Tongue cancer ([place])   04/10/2015 Imaging CT Neck with contrast:  L base of tongue SCC with regional adenopathy; suspected metastatic disease to C2, T2.   04/13/2015 Initial Biopsy Accession [id number]  Lymph node, needle/core biopsy - SCC, p16 positive.   04/18/2015 Initial Diagnosis Tongue cancer   04/24/2015 Imaging PET CT showed tongue cancer, lung nodule and possible bone mets   04/26/2015 Surgery He had dental extractions   05/03/2015 Imaging CT neck with contrast: L base of tongue with regional adenopathy, metastatic disease C2, C3, T2 vertebra; posterior L fourth rib.   05/09/2015 Procedure Port-a-cath placed.   05/11/2015 - 05/23/2015 Radiation Therapy Palliative radiation therapy:  1) T1-T3 and Left 3rd posterior rib / 30 Gy in 10 fractions, 2) Base of tongue, neck, and Cspine / 30 Gy in 10 fractions.   06/01/2015 - 07/17/2015 Chemotherapy He received 2 cycles of carboplatin, 5-FU and cetuximab   06/04/2015 - 06/05/2015 Hospital Admission He was admitted to the hospital after syncopal episode and had right nondisplaced fibular fracture   07/20/2015 - 07/24/2015 Hospital Admission he was admitted to the hospital for weakness and uncontrolled nausea and dehydration   08/02/2015 Imaging PET CT scan showed positive response to treatment   08/04/2015 - 08/12/2015 Hospital Admission He was admitted to the hospital with sepsis, MRSA bacteremia and respiratory failure   08/07/2015 Surgery PAC removed r/t sepsis, MRSA  bacteremia.   10/15/2015 Imaging Ct scan of the neck, chest, abdomen and pelvis showed stable sclerotic lesions. No new disease  progression   01/14/2016 Imaging PET scan showed new hypermetabolic left supraclavicular nodes. Progression of osseous metastasis.     INTERVAL HISTORY: Please see below for problem oriented charting.He is seen prior to cycle 3 of therapy. He continues of chronic neck pain. Denies new bone pain. He tolerated treatment well without any side effects   REVIEW OF SYSTEMS:   Constitutional: Denies fevers, chills or abnormal weight loss Eyes: Denies blurriness of vision Ears, nose, mouth, throat, and face: Denies mucositis or sore throat Respiratory: Denies cough, dyspnea or wheezes Cardiovascular: Denies palpitation, chest discomfort or lower extremity swelling Gastrointestinal:  Denies nausea, heartburn or change in bowel habits Skin: Denies abnormal skin rashes Lymphatics: Denies new lymphadenopathy or easy bruising Neurological:Denies numbness, tingling or new weaknesses Behavioral/Psych: Mood is stable, no new changes  All other systems were reviewed with the patient and are negative.  I have reviewed the past medical history, past surgical history, social history and family history with the patient and they are unchanged from previous note.  ALLERGIES:  is allergic to morphine and related.  MEDICATIONS:  Current Outpatient Prescriptions  Medication Sig Dispense Refill  . Armodafinil 250 MG tablet Take 250 mg by mouth daily.    Marland Kitchen aspirin EC 81 MG tablet Take 81 mg by mouth daily.    . fluticasone (FLONASE) 50 MCG/ACT nasal spray Place 2 sprays into both nostrils daily. 16 g 0  . gabapentin (NEURONTIN) 100 MG capsule Take 600 mg by mouth 2 (two) times daily. Takes with supper and at bedtime    . HYDROmorphone (DILAUDID) 4 MG tablet Take 1 tablet (4 mg total)  by mouth every 4 (four) hours as needed for severe pain. 90 tablet 0  . ibuprofen (ADVIL,MOTRIN) 200 MG tablet Take 600 mg by mouth every 6 (six) hours as needed for mild pain or moderate pain.     Marland Kitchen lansoprazole (PREVACID) 30 MG  capsule Take 30 mg by mouth daily at 12 noon.    . Magnesium Oxide 400 MG CAPS Take 1 capsule (400 mg total) by mouth daily. 30 capsule 0  . methadone (DOLOPHINE) 10 MG tablet Take 1 tablet (10 mg total) by mouth every 8 (eight) hours. 90 tablet 0  . polyethylene glycol powder (GLYCOLAX/MIRALAX) powder Take 17 g by mouth 2 (two) times daily. 255 g 0  . predniSONE (DELTASONE) 20 MG tablet Take 1 tablet (20 mg total) by mouth daily with breakfast. 10 tablet 0   No current facility-administered medications for this visit.   Facility-Administered Medications Ordered in Other Visits  Medication Dose Route Frequency Provider Last Rate Last Dose  . 0.9 %  sodium chloride infusion   Intravenous Once [name], MD      . pembrolizumab (KEYTRUDA) 200 mg in sodium chloride 0.9 % 50 mL chemo infusion  200 mg Intravenous Once [name], MD        PHYSICAL EXAMINATION: ECOG PERFORMANCE STATUS: 1 - Symptomatic but completely ambulatory  Filed Vitals:   03/04/16 1134  BP: 168/72  Pulse: 73  Temp: 98.3 F (36.8 C)  Resp: 18   Filed Weights   03/04/16 1134  Weight: 224 lb 6.4 oz (101.787 kg)    GENERAL:alert, no distress and comfortable SKIN: skin color, texture, turgor are normal, no rashes or significant lesions EYES: normal, Conjunctiva are pink and non-injected, sclera clear OROPHARYNX:no exudate, no erythema and lips, buccal mucosa, and tongue normal  NECK: He has mild lymphedema around his neck LYMPH:  He has palpable lymphadenopathy on the left supraclavicular region, regressed in size LUNGS: clear to auscultation and percussion with normal breathing effort HEART: regular rate & rhythm and no murmurs and no lower extremity edema ABDOMEN:abdomen soft, non-tender and normal bowel sounds Musculoskeletal:no cyanosis of digits and no clubbing  NEURO: alert & oriented x 3 with fluent speech, no focal motor/sensory deficits  LABORATORY DATA:  I have reviewed the data as listed     Component Value Date/Time   NA 141 03/04/2016 1113   NA 139 10/01/2015 0957   K 4.1 03/04/2016 1113   K 4.3 10/01/2015 0957   CL 104 10/01/2015 0957   CO2 24 03/04/2016 1113   CO2 25 10/01/2015 0957   GLUCOSE 127 03/04/2016 1113   GLUCOSE 121* 10/01/2015 0957   BUN 24.0 03/04/2016 1113   BUN 23 10/01/2015 0957   CREATININE 1.4* 03/04/2016 1113   CREATININE 1.22 10/01/2015 0957   CREATININE 1.01 08/12/2015 0244   CALCIUM 9.5 03/04/2016 1113   CALCIUM 9.2 10/01/2015 0957   PROT 7.1 03/04/2016 1113   PROT 7.0 08/12/2015 0244   ALBUMIN 3.6 03/04/2016 1113   ALBUMIN 2.9* 08/12/2015 0244   AST 15 03/04/2016 1113   AST 20 08/12/2015 0244   ALT 13 03/04/2016 1113   ALT 20 08/12/2015 0244   ALKPHOS 136 03/04/2016 1113   ALKPHOS 83 08/12/2015 0244   BILITOT 0.78 03/04/2016 1113   BILITOT 0.8 08/12/2015 0244   GFRNONAA >60 08/12/2015 0244   GFRAA >60 08/12/2015 0244    No results found for: SPEP, UPEP  Lab Results  Component Value Date   WBC 6.1 03/04/2016  NEUTROABS 5.1 03/04/2016   HGB 13.5 03/04/2016   HCT 41.6 03/04/2016   MCV 87.9 03/04/2016   PLT 168 03/04/2016      Chemistry      Component Value Date/Time   NA 141 03/04/2016 1113   NA 139 10/01/2015 0957   K 4.1 03/04/2016 1113   K 4.3 10/01/2015 0957   CL 104 10/01/2015 0957   CO2 24 03/04/2016 1113   CO2 25 10/01/2015 0957   BUN 24.0 03/04/2016 1113   BUN 23 10/01/2015 0957   CREATININE 1.4* 03/04/2016 1113   CREATININE 1.22 10/01/2015 0957   CREATININE 1.01 08/12/2015 0244      Component Value Date/Time   CALCIUM 9.5 03/04/2016 1113   CALCIUM 9.2 10/01/2015 0957   ALKPHOS 136 03/04/2016 1113   ALKPHOS 83 08/12/2015 0244   AST 15 03/04/2016 1113   AST 20 08/12/2015 0244   ALT 13 03/04/2016 1113   ALT 20 08/12/2015 0244   BILITOT 0.78 03/04/2016 1113   BILITOT 0.8 08/12/2015 0244      ASSESSMENT & PLAN:  Tongue cancer He tolerated treatment well with mild regression in the size of the lymph  node. I plan to repeat PET CT scan of the cycle 3 of therapy.  Metastasis to supraclavicular lymph node [facility]) He is responding to treatment with regression in the size of the metastatic cancer to lymph node. I will continue cycle 3 without dose adjustment. I present to repeat imaging study after cycle 3  Chronic neck pain He will continue taking methadone to 1 tablet 3 times a day along with Dilaudid. He felt that the pain behind his neck and the left side of the ear is well controlled with the current regimen.   Orders Placed This Encounter  Procedures  . NM PET Image Restag (PS) Skull Base To Thigh    Standing Status: Future     Number of Occurrences:      Standing Expiration Date: 05/04/2017    Order Specific Question:  Reason for Exam (SYMPTOM  OR DIAGNOSIS REQUIRED)    Answer:  tongue cancer, assess response to chemo    Order Specific Question:  Preferred imaging location?    Answer:  [facility]   All questions were answered. The patient knows to call the clinic with any problems, questions or concerns. No barriers to learning was detected. I spent 15 minutes counseling the patient face to face. The total time spent in the appointment was 20 minutes and more than 50% was on counseling and review of test results     [facility], [place], MD 03/04/2016 12:56 PM

## 2016-03-04 NOTE — Assessment & Plan Note (Signed)
He will continue taking methadone to 1 tablet 3 times a day along with Dilaudid. He felt that the pain behind his neck and the left side of the ear is well controlled with the current regimen.

## 2016-03-05 ENCOUNTER — Telehealth: Payer: Self-pay | Admitting: Hematology and Oncology

## 2016-03-05 ENCOUNTER — Telehealth: Payer: Self-pay | Admitting: *Deleted

## 2016-03-05 MED ORDER — AMLODIPINE BESYLATE 10 MG PO TABS: 10.0000 mg | ORAL_TABLET | Freq: Every day | ORAL | Status: AC

## 2016-03-05 MED ORDER — METHADONE HCL 10 MG PO TABS: 10.0000 mg | ORAL_TABLET | Freq: Three times a day (TID) | ORAL | Status: DC

## 2016-03-05 NOTE — Telephone Encounter (Signed)
lvm for pt regarding to April appt.... °

## 2016-03-05 NOTE — Telephone Encounter (Signed)
Pt states he forgot to get Rx refill for Methadone yesterday.  He will come back to pick up.  He also asks if [name] will send refill on Norvasc to [place] for 10 mg?  He says he has 5 mg and she told him to increase to taking two tablets so he will need new Rx sent.

## 2016-03-05 NOTE — Telephone Encounter (Signed)
Informed pt Rx Methadone ready to pick up and new Rx for Norvasc 10 mg daily sent to [facility].  Pt verbalized understanding.

## 2016-03-07 ENCOUNTER — Other Ambulatory Visit: Payer: Self-pay | Admitting: Hematology and Oncology

## 2016-03-24 ENCOUNTER — Ambulatory Visit (HOSPITAL_COMMUNITY)
Admission: RE | Admit: 2016-03-24 | Discharge: 2016-03-24 | Disposition: A | Discharge status: Home / Self Care | Payer: PPO | Source: Ambulatory Visit | Attending: Hematology and Oncology | Admitting: Hematology and Oncology

## 2016-03-24 ENCOUNTER — Other Ambulatory Visit: Payer: Self-pay | Admitting: Hematology and Oncology

## 2016-03-24 DIAGNOSIS — C7951 Secondary malignant neoplasm of bone: Secondary | ICD-10-CM | POA: Diagnosis present

## 2016-03-24 DIAGNOSIS — I517 Cardiomegaly: Secondary | ICD-10-CM | POA: Diagnosis not present

## 2016-03-24 DIAGNOSIS — N200 Calculus of kidney: Secondary | ICD-10-CM | POA: Insufficient documentation

## 2016-03-24 DIAGNOSIS — C029 Malignant neoplasm of tongue, unspecified: Secondary | ICD-10-CM

## 2016-03-24 DIAGNOSIS — M899 Disorder of bone, unspecified: Secondary | ICD-10-CM | POA: Diagnosis not present

## 2016-03-24 DIAGNOSIS — I2510 Atherosclerotic heart disease of native coronary artery without angina pectoris: Secondary | ICD-10-CM | POA: Insufficient documentation

## 2016-03-24 DIAGNOSIS — C770 Secondary and unspecified malignant neoplasm of lymph nodes of head, face and neck: Secondary | ICD-10-CM

## 2016-03-24 LAB — GLUCOSE, CAPILLARY: Glucose-Capillary: 133 mg/dL — ABNORMAL HIGH (ref 65–99)

## 2016-03-24 MED ORDER — FLUDEOXYGLUCOSE F - 18 (FDG) INJECTION
11.2000 | Freq: Once | INTRAVENOUS | Status: AC | PRN
  Administered 2016-03-24: 11.2 via INTRAVENOUS

## 2016-03-25 ENCOUNTER — Ambulatory Visit: Payer: Self-pay

## 2016-03-25 ENCOUNTER — Telehealth: Payer: Self-pay | Admitting: *Deleted

## 2016-03-25 ENCOUNTER — Other Ambulatory Visit: Payer: Self-pay

## 2016-03-25 ENCOUNTER — Ambulatory Visit (HOSPITAL_BASED_OUTPATIENT_CLINIC_OR_DEPARTMENT_OTHER): Payer: PPO | Admitting: Hematology and Oncology

## 2016-03-25 ENCOUNTER — Encounter: Payer: Self-pay | Admitting: Hematology and Oncology

## 2016-03-25 ENCOUNTER — Other Ambulatory Visit: Payer: Self-pay | Admitting: Hematology and Oncology

## 2016-03-25 VITALS — BP 144/75 | HR 68 | Temp 98.0°F | Resp 18 | Ht 69.0 in | Wt 224.2 lb

## 2016-03-25 DIAGNOSIS — C770 Secondary and unspecified malignant neoplasm of lymph nodes of head, face and neck: Secondary | ICD-10-CM | POA: Diagnosis not present

## 2016-03-25 DIAGNOSIS — C7951 Secondary malignant neoplasm of bone: Secondary | ICD-10-CM | POA: Diagnosis not present

## 2016-03-25 DIAGNOSIS — C029 Malignant neoplasm of tongue, unspecified: Secondary | ICD-10-CM | POA: Diagnosis not present

## 2016-03-25 DIAGNOSIS — Z515 Encounter for palliative care: Secondary | ICD-10-CM | POA: Insufficient documentation

## 2016-03-25 DIAGNOSIS — G893 Neoplasm related pain (acute) (chronic): Secondary | ICD-10-CM

## 2016-03-25 MED ORDER — HYDROMORPHONE HCL 4 MG PO TABS: 4.0000 mg | ORAL_TABLET | ORAL | Status: DC | PRN

## 2016-03-25 NOTE — Assessment & Plan Note (Signed)
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 We discussed importance of Advanced Directives and Living will. We also discussed potential referral for home base palliative care but the patient declined for now and would like to discuss further with family.

## 2016-03-25 NOTE — Telephone Encounter (Addendum)
  Oncology Nurse Navigator Documentation  Navigator Location: [facility] (03/25/16 1553) Navigator Encounter Type: Telephone (03/25/16 1553) Telephone: Incoming Call;Appt Confirmation/Clarification (03/25/16 1553)       Mr. Zebrowski returned my earlier VMM, confirmed his understanding of appt with Dr. Isidore Moos 04/02/16 8:30/9:00 AM.  Gayleen Orem, RN, BSN, [place] [facility] [place] [phone]                                     Time Spent with Patient: 15 (03/25/16 1553)

## 2016-03-25 NOTE — Assessment & Plan Note (Signed)
Unfortunately, he has disease progression. I reviewed with him and his daughter current guidelines. Although he did not progress on the first line of treatment, he had a lot of side effects associated with it including complication with bacteremia and sepsis. With recent immunotherapy, he had minimal response from treatment. Any further treatment in the future would likely yield less response to treatment with potential negative impact on quality of life. His main complaints right now are worsening neck pain and rib cage pain which correlated with disease noted on PET CT scan. I refill his prescription Dilaudid today but will also refer him to radiation oncologist for localized radiation treatment for palliative purposes. I gave him copy from current guidelines regarding treatment options. I also offered him second opinion elsewhere. We also discussed palliative care & hospice. Ultimately, the patient will discuss this findings further with family members before making final decision about plan of care.

## 2016-03-25 NOTE — Progress Notes (Signed)
[place] OFFICE PROGRESS NOTE  Patient Care Team: [name], MD as PCP - General (Family Medicine) Leota Sauers, RN as Oncology Nurse Navigator [name], MD as Consulting Physician (Hematology and Oncology) Eppie Gibson, MD as Attending Physician (Radiation Oncology) Karie Mainland, RD as Dietitian (Nutrition)  SUMMARY OF ONCOLOGIC HISTORY:   Tongue cancer ([place])   04/10/2015 Imaging CT Neck with contrast:  L base of tongue SCC with regional adenopathy; suspected metastatic disease to C2, T2.   04/13/2015 Initial Biopsy Accession [id number]  Lymph node, needle/core biopsy - SCC, p16 positive.   04/18/2015 Initial Diagnosis Tongue cancer   04/24/2015 Imaging PET CT showed tongue cancer, lung nodule and possible bone mets   04/26/2015 Surgery He had dental extractions   05/03/2015 Imaging CT neck with contrast: L base of tongue with regional adenopathy, metastatic disease C2, C3, T2 vertebra; posterior L fourth rib.   05/09/2015 Procedure Port-a-cath placed.   05/11/2015 - 05/23/2015 Radiation Therapy Palliative radiation therapy:  1) T1-T3 and Left 3rd posterior rib / 30 Gy in 10 fractions, 2) Base of tongue, neck, and Cspine / 30 Gy in 10 fractions.   06/01/2015 - 07/17/2015 Chemotherapy He received 2 cycles of carboplatin, 5-FU and cetuximab   06/04/2015 - 06/05/2015 Hospital Admission He was admitted to the hospital after syncopal episode and had right nondisplaced fibular fracture   07/20/2015 - 07/24/2015 Hospital Admission he was admitted to the hospital for weakness and uncontrolled nausea and dehydration   08/02/2015 Imaging PET CT scan showed positive response to treatment   08/04/2015 - 08/12/2015 Hospital Admission He was admitted to the hospital with sepsis, MRSA bacteremia and respiratory failure   08/07/2015 Surgery PAC removed r/t sepsis, MRSA  bacteremia.   10/15/2015 Imaging Ct scan of the neck, chest, abdomen and pelvis showed stable sclerotic lesions. No new disease  progression   01/14/2016 Imaging PET scan showed new hypermetabolic left supraclavicular nodes. Progression of osseous metastasis.    01/22/2016 - 03/04/2016 Chemotherapy He received Keytruda   03/24/2016 PET scan PET scan showed mixed response but overall progressive disease    INTERVAL HISTORY: Please see below for problem oriented charting. He returns today for further follow-up. He complained of worsening neck pain and rib cage pain. He denies swallowing difficulties. He is taking Dilaudid more frequently because of pain. Denies constipation or nausea   REVIEW OF SYSTEMS:   Constitutional: Denies fevers, chills or abnormal weight loss Eyes: Denies blurriness of vision Ears, nose, mouth, throat, and face: Denies mucositis or sore throat Respiratory: Denies cough, dyspnea or wheezes Cardiovascular: Denies palpitation, chest discomfort or lower extremity swelling Gastrointestinal:  Denies nausea, heartburn or change in bowel habits Skin: Denies abnormal skin rashes Lymphatics: Denies new lymphadenopathy or easy bruising Neurological:Denies numbness, tingling or new weaknesses Behavioral/Psych: Mood is stable, no new changes  All other systems were reviewed with the patient and are negative.  I have reviewed the past medical history, past surgical history, social history and family history with the patient and they are unchanged from previous note.  ALLERGIES:  is allergic to morphine and related.  MEDICATIONS:  Current Outpatient Prescriptions  Medication Sig Dispense Refill  . amLODipine (NORVASC) 10 MG tablet Take 1 tablet (10 mg total) by mouth daily. 60 tablet 1  . Armodafinil 250 MG tablet Take 250 mg by mouth daily.    Marland Kitchen aspirin EC 81 MG tablet Take 81 mg by mouth daily.    . fluticasone (FLONASE) 50 MCG/ACT  nasal spray Place 2 sprays into both nostrils daily. 16 g 0  . gabapentin (NEURONTIN) 100 MG capsule Take 600 mg by mouth 2 (two) times daily. Takes with supper and at  bedtime    . HYDROmorphone (DILAUDID) 4 MG tablet Take 1 tablet (4 mg total) by mouth every 4 (four) hours as needed for severe pain. 90 tablet 0  . ibuprofen (ADVIL,MOTRIN) 200 MG tablet Take 600 mg by mouth every 6 (six) hours as needed for mild pain or moderate pain.     Marland Kitchen lansoprazole (PREVACID) 30 MG capsule Take 30 mg by mouth daily at 12 noon.    . Magnesium Oxide 400 MG CAPS Take 1 capsule (400 mg total) by mouth daily. 30 capsule 0  . methadone (DOLOPHINE) 10 MG tablet Take 1 tablet (10 mg total) by mouth every 8 (eight) hours. 90 tablet 0  . polyethylene glycol powder (GLYCOLAX/MIRALAX) powder Take 17 g by mouth 2 (two) times daily. 255 g 0   No current facility-administered medications for this visit.    PHYSICAL EXAMINATION: ECOG PERFORMANCE STATUS: 1 - Symptomatic but completely ambulatory  Filed Vitals:   03/25/16 1045  BP: 144/75  Pulse: 68  Temp: 98 F (36.7 C)  Resp: 18   Filed Weights   03/25/16 1045  Weight: 224 lb 3.2 oz (101.696 kg)    GENERAL:alert, no distress and comfortable SKIN: skin color, texture, turgor are normal, no rashes or significant lesions EYES: normal, Conjunctiva are pink and non-injected, sclera clear OROPHARYNX:no exudate, no erythema and lips, buccal mucosa, and tongue normal  NECK: supple, thyroid normal size, non-tender, without nodularity LYMPH:  He has persistent palpable supraclavicular lymphadenopathy on the left of the neck and new lymphadenopathy in the cervical region  LUNGS: clear to auscultation and percussion with normal breathing effort HEART: regular rate & rhythm and no murmurs and no lower extremity edema ABDOMEN:abdomen soft, non-tender and normal bowel sounds Musculoskeletal:no cyanosis of digits and no clubbing . I cannot palpate the chest wall lesion NEURO: alert & oriented x 3 with fluent speech, no focal motor/sensory deficits  LABORATORY DATA:  I have reviewed the data as listed    Component Value Date/Time    NA 141 03/04/2016 1113   NA 139 10/01/2015 0957   K 4.1 03/04/2016 1113   K 4.3 10/01/2015 0957   CL 104 10/01/2015 0957   CO2 24 03/04/2016 1113   CO2 25 10/01/2015 0957   GLUCOSE 127 03/04/2016 1113   GLUCOSE 121* 10/01/2015 0957   BUN 24.0 03/04/2016 1113   BUN 23 10/01/2015 0957   CREATININE 1.4* 03/04/2016 1113   CREATININE 1.22 10/01/2015 0957   CREATININE 1.01 08/12/2015 0244   CALCIUM 9.5 03/04/2016 1113   CALCIUM 9.2 10/01/2015 0957   PROT 7.1 03/04/2016 1113   PROT 7.0 08/12/2015 0244   ALBUMIN 3.6 03/04/2016 1113   ALBUMIN 2.9* 08/12/2015 0244   AST 15 03/04/2016 1113   AST 20 08/12/2015 0244   ALT 13 03/04/2016 1113   ALT 20 08/12/2015 0244   ALKPHOS 136 03/04/2016 1113   ALKPHOS 83 08/12/2015 0244   BILITOT 0.78 03/04/2016 1113   BILITOT 0.8 08/12/2015 0244   GFRNONAA >60 08/12/2015 0244   GFRAA >60 08/12/2015 0244    No results found for: SPEP, UPEP  Lab Results  Component Value Date   WBC 6.1 03/04/2016   NEUTROABS 5.1 03/04/2016   HGB 13.5 03/04/2016   HCT 41.6 03/04/2016   MCV 87.9 03/04/2016  PLT 168 03/04/2016      Chemistry      Component Value Date/Time   NA 141 03/04/2016 1113   NA 139 10/01/2015 0957   K 4.1 03/04/2016 1113   K 4.3 10/01/2015 0957   CL 104 10/01/2015 0957   CO2 24 03/04/2016 1113   CO2 25 10/01/2015 0957   BUN 24.0 03/04/2016 1113   BUN 23 10/01/2015 0957   CREATININE 1.4* 03/04/2016 1113   CREATININE 1.22 10/01/2015 0957   CREATININE 1.01 08/12/2015 0244      Component Value Date/Time   CALCIUM 9.5 03/04/2016 1113   CALCIUM 9.2 10/01/2015 0957   ALKPHOS 136 03/04/2016 1113   ALKPHOS 83 08/12/2015 0244   AST 15 03/04/2016 1113   AST 20 08/12/2015 0244   ALT 13 03/04/2016 1113   ALT 20 08/12/2015 0244   BILITOT 0.78 03/04/2016 1113   BILITOT 0.8 08/12/2015 0244       RADIOGRAPHIC STUDIES: I have personally reviewed the radiological images as listed and agreed with the findings in the report. Nm Pet  Image Restag (ps) Skull Base To Thigh  03/24/2016  CLINICAL DATA:  Subsequent treatment strategy for tongue carcinoma with metastatic disease to bone and supraclavicular lymph node. EXAM: NUCLEAR MEDICINE PET SKULL BASE TO THIGH TECHNIQUE: 11.2 mCi F-18 FDG was injected intravenously. Full-ring PET imaging was performed from the skull base to thigh after the radiotracer. CT data was obtained and used for attenuation correction and anatomic localization. FASTING BLOOD GLUCOSE:  Value: 133 mg/dl COMPARISON:  Multiple exams, including 01/14/2016 FINDINGS: NECK New abnormal focus of hypermetabolic activity along the left semi spinalis capitis muscle, maximum standard uptake value 8.1, possibly an muscular metastatic lesion or an intramuscular lymph node. Newly hypermetabolic 5 mm in short axis lymph node along the deep margin of the left parotid gland, maximum standard uptake value 5.4, compatible with malignancy. Left level Ib lymph node short axis diameter 1.1 cm, maximum standard uptake value 4.8, previously not hypermetabolic. Small right level Ib lymph nodes are clustered and have a maximum standard uptake value of 4.2. Left level III lymph node maximum standard uptake value 7.5 at about the level of the glottis, previously approximately 4.8. More laterally at this level several lymph nodes have a maximum standard uptake value of 2 4.4, previously 8.5. A small level IV lymph node on the left has maximum standard uptake value 3.6, faintly increased. There is some asymmetric prominence of the right posterior tongue base compared to the left but without hypermetabolic activity directly in this vicinity. There is some asymmetry of the glottis with the right cord maximum standard uptake value 6.6 in the left approximately 4.3. Soft tissue swelling along the lower chin region. CHEST No hypermetabolic mediastinal or hilar nodes. No suspicious pulmonary nodules on the CT scan. Mild cardiomegaly with coronary  atherosclerosis. Biapical pleural parenchymal scarring. ABDOMEN/PELVIS Stranding in the mesentery noted with a 6 mm in short axis mesenteric lymph node on image 134/4 with maximum standard uptake value 5.4, previous maximum standard uptake value of this nodule 17.9. This was previously referred to as being a small bowel lesion but I believe it actually rib is a mesenteric node. Small bilateral nonobstructive renal calculi. SKELETON Further enlargement of the destructive right anterior fifth rib lesion with prominent extraosseous soft tissue involvement, currently this lesion measures 3.2 by 5.2 cm (formerly approximately 1.9 by 2.3 cm) and has a maximum standard uptake value of 25.2 (formerly 15.3) Along the inferior articular facet of  L1, focal hypermetabolic activity observed with maximum standard uptake value 10.0 (formerly 10.4) compatible with a metastatic lesion. Along the right posterior iliac bone, there is a small lytic metastatic lesion with maximum standard uptake value 9.9 (a former measurement 8.2). Focal hypermetabolic activity in the right T10 pedicle, maximum standard uptake value 6.0. This appears to be new. Previous hypermetabolic activity anteriorly in the T11 vertebral body has resolved. The previous lytic lesion in this vicinity is no sclerotic. IMPRESSION: 1. Generally progressive malignancy, various new areas of lymph node involvement in the neck and supraclavicular region, significant progression in the destructive right anterior fifth rib lesion, new right T10 pedicle lesion, and some stable involvement of other levels. Despite the general progression, there have been some areas where the involvement is improved. For example, anteriorly in the T11 vertebral body the previously lytic hypermetabolic lesion is not sclerotic and not hypermetabolic. Moreover, and mesenteric lymph node with surrounding mesenteric stranding has decreased in hypermetabolic activity, previously 17.9 and currently 5.4  (this was previously reported as being in small bowel but I favor it as being a mesenteric lymph node). 2. There is some asymmetry of the glottis with the right cord maximum standard uptake value up to 6.6 and the left approximately 4.3. This may be incidental but merits observation. 3. Other imaging findings of potential clinical significance: Mild cardiomegaly with coronary atherosclerosis. Small bilateral nonobstructive renal calculi. Electronically Signed   By: Van Clines M.D.   On: 03/24/2016 11:48     ASSESSMENT & PLAN:  Tongue cancer Unfortunately, he has disease progression. I reviewed with him and his daughter current guidelines. Although he did not progress on the first line of treatment, he had a lot of side effects associated with it including complication with bacteremia and sepsis. With recent immunotherapy, he had minimal response from treatment. Any further treatment in the future would likely yield less response to treatment with potential negative impact on quality of life. His main complaints right now are worsening neck pain and rib cage pain which correlated with disease noted on PET CT scan. I refill his prescription Dilaudid today but will also refer him to radiation oncologist for localized radiation treatment for palliative purposes. I gave him copy from current guidelines regarding treatment options. I also offered him second opinion elsewhere. We also discussed palliative care & hospice. Ultimately, the patient will discuss this findings further with family members before making final decision about plan of care.  Metastatic cancer to bone [facility]) He has diffuse metastatic cancer to the bone. 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 I will consult radiation oncologist for possible palliative radiation treatment to the occipital region  Palliative care encounter The patient is aware he has stage IV disease and treatment is strictly palliative. We discussed importance of Advanced Directives and Living will. We also discussed potential referral for home base palliative care but the patient declined for now and would like to discuss further with family.   No orders of the defined types were placed in this encounter.   All questions were answered. The patient knows to call the clinic with any problems, questions or concerns. No barriers to learning was detected.  I spent 30 minutes counseling the patient face to face. The total time spent in the appointment was 40 minutes and more than 50% was on counseling and review of test results     [facility], Jcion Buddenhagen, MD 03/25/2016 11:24 AM

## 2016-03-25 NOTE — Assessment & Plan Note (Signed)
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 His main bone pain is in the anterior right rib cage area and I will consult radiation oncologist for possible palliative radiation therapy

## 2016-03-25 NOTE — Assessment & Plan Note (Addendum)
He has persistent disease in the left supraclavicular region, new recurrence of disease in the left neck and new disease in the occipital area that caused a lot of pain. I will consult radiation oncologist for possible palliative radiation treatment to the occipital region

## 2016-03-26 ENCOUNTER — Telehealth: Payer: Self-pay | Admitting: *Deleted

## 2016-03-26 NOTE — Telephone Encounter (Signed)
  Oncology Nurse Navigator Documentation  Navigator Location: [facility] (03/26/16 0810) Navigator Encounter Type: Telephone (03/26/16 0810) Telephone: Incoming Call (03/26/16 0810) Abnormal Finding Date: 04/10/15 (03/26/16 0810) Confirmed Diagnosis Date: 04/13/15 (03/26/16 0810)   Treatment Initiated Date: 05/10/15 (03/26/16 0810)   Treatment Phase: Treatment (03/26/16 0810) Barriers/Navigation Needs: Coordination of Care (03/26/16 0810)   Interventions: Referrals (03/26/16 0810) Referrals: Other (03/26/16 0810)     Shane Cantu called in follow-up to his 03/25/16 appt with [name], accepted her offer to make a referral.  He requested a referral be made to [facility].  I notified [name].  Gayleen Orem, RN, BSN, [facility] [place] [phone]                   Time Spent with Patient: 15 (03/26/16 0810)

## 2016-03-26 NOTE — Telephone Encounter (Signed)
New patient referral called to [facility]. LM for Katrina new patient coordinator (ph- [phone])

## 2016-03-26 NOTE — Telephone Encounter (Signed)
Shane Cantu,  Please refer patient to [facility] ([facility] specialty) for second opinion

## 2016-03-27 NOTE — Progress Notes (Signed)
Histology and Location of Primary Cancer:  Left Base of Tongue SCC  Sites of Visceral and Bony Metastatic Disease:  PET scan 03/24/16  IMPRESSION: 1. Generally progressive malignancy, various new areas of lymph node involvement in the neck and supraclavicular region, significant progression in the destructive right anterior fifth rib lesion, new right T10 pedicle lesion, and some stable involvement of other levels. Despite the general progression, there have been some areas where the involvement is improved. For example, anteriorly in the T11 vertebral body the previously lytic hypermetabolic lesion is not sclerotic and not hypermetabolic. Moreover, and mesenteric lymph node with surrounding mesenteric stranding has decreased in hypermetabolic activity, previously 17.9 and currently 5.4 (this was previously reported as being in small bowel but I favor it as being a mesenteric lymph node). 2. There is some asymmetry of the glottis with the right cord maximum standard uptake value up to 6.6 and the left approximately   Location(s) of Symptomatic Metastases:  Occipital Region Anterior Right Rib cage area.  Past/Anticipated chemotherapy by medical oncology, if any:   06/01/2015 - 07/17/2015 Chemotherapy He received 2 cycles of carboplatin, 5-FU and cetuximab         01/22/2016 - 03/04/2016 Chemotherapy He received Keytruda        Pain on a scale of 0-10 is: 5/10 in his Left Neck, and Right lateral side.     Ambulatory status? Walker? Wheelchair?: He is ambulatory  SAFETY ISSUES: Prior radiation?   05/11/2015 - 05/23/2015 Radiation Therapy Palliative radiation therapy: 1) T1-T3 and Left 3rd posterior rib / 30 Gy in 10 fractions, 2) Base of tongue, neck, and Cspine / 30 Gy in 10 fractions.         Pacemaker/ICD? No  Possible current pregnancy? N/A  Is the patient on methotrexate? No  Current Complaints / other details: He is receiving a second opinion at [facility] and Neck oncology center.  He has an appointment on 04/09/16 at [facility].  BP 159/91 mmHg  Pulse 64  Temp(Src) 98.2 F (36.8 C)  Ht 5\' 9"  (1.753 m)  Wt 220 lb 9.6 oz (100.064 kg)  BMI 32.56 kg/m2  SpO2 100%   Wt Readings from Last 3 Encounters:  04/02/16 220 lb 9.6 oz (100.064 kg)  03/25/16 224 lb 3.2 oz (101.696 kg)  03/04/16 224 lb 6.4 oz (101.787 kg)

## 2016-03-28 ENCOUNTER — Telehealth: Payer: Self-pay | Admitting: *Deleted

## 2016-03-28 NOTE — Telephone Encounter (Signed)
Katrina from [facility] left message regarding scheduling Shane Cantu for second opinion. Attempted to call back. LM to call.

## 2016-03-31 ENCOUNTER — Telehealth: Payer: Self-pay | Admitting: *Deleted

## 2016-03-31 NOTE — Telephone Encounter (Signed)
  Oncology Nurse Navigator Documentation  Navigator Location: [facility] (03/31/16 1313) Navigator Encounter Type: Telephone (03/31/16 1313) Telephone: Incoming Call (03/31/16 1313)             Barriers/Navigation Needs: Coordination of Care (03/31/16 1313)   Interventions: Coordination of Care;Referrals (03/31/16 1313)     Shane Cantu called to check on the status of his referral to [facility].  I indicated I would check for him. Then spoke with Tammi RN who place the referral on behalf of [name], she indicated she would call [facility] and then follow-up with Shane Cantu.  Gayleen Orem, RN, BSN, [place] [facility] [place] [phone]                     Time Spent with Patient: 15 (03/31/16 1313)

## 2016-04-01 ENCOUNTER — Telehealth: Payer: Self-pay | Admitting: *Deleted

## 2016-04-01 ENCOUNTER — Telehealth: Payer: Self-pay | Admitting: Hematology and Oncology

## 2016-04-01 NOTE — Telephone Encounter (Signed)
Pt is scheduled to go to [facility] on Wednesday, 04/09/16. Need pathology report from 04/13/15 and Radiation notes

## 2016-04-01 NOTE — Telephone Encounter (Signed)
Get HIM to forward the records

## 2016-04-01 NOTE — Telephone Encounter (Signed)
Faxed path and office note to [facility] [phone]

## 2016-04-01 NOTE — Progress Notes (Signed)
Radiation Oncology         [phone] ________________________________  Outpatient Re-Consultation  Name: Shane Cantu MRN: [id number]  Date: 04/02/2016  DOB: 1956-02-03  [id number] J., MD  [name], MD   REFERRING PHYSICIAN: [name], MD  DIAGNOSIS:    ICD-9-CM ICD-10-CM   1. Metastatic cancer to bone ([place]) 198.5 C79.51 Ambulatory referral to Social Work  2. 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 male who is well known by me.  After palliative RT Dr Elson Areas delivered Carboplatin, Cetuximab and 5FU. Due to side effects, this was stopped.  This was eventually followed by [facility].  Most recent PET reveals overall progression but some areas are stable to improved.  [name] stopped Beryle Flock.    He was referred back to me for consideration of palliative RT to address worsening L neck/occipital  pain and R rib cage pain which correlated with disease noted on PET CT scan.  Per [name], "Although he did not progress on the first line of treatment, he had a lot of side effects associated with it including complication with bacteremia and sepsis. With recent immunotherapy, he had minimal response from treatment." He is pursuing a second opinion at [facility]. 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   PREVIOUS RADIATION THERAPY: Yes as above  PAST MEDICAL HISTORY:  has a past medical history of Chronic pain; Back pain; Chronic neck pain; History of kidney stones; Allergic rhinitis; Hypertension; Migraine headache without aura; Sleep apnea; GERD (gastroesophageal reflux disease); Neuropathy ([place]); Arthritis; Cancer ([place]);  Bacterial conjunctivitis of left eye (07/26/2015); Pharyngitis, acute (09/06/2015); and Chronic fatigue (01/11/2016).    PAST SURGICAL HISTORY: Past Surgical History  Procedure Laterality Date  . Back surgery  03/1993, 10/2002    Dr. Louanne Skye  . Tonsillectomy      as a child  . Neck surgery  2004    Dr. Louanne Skye  . Lymph node biopsy    . Nasal sinusotomy  1977  . Colonoscopy    . Multiple extractions with alveoloplasty N/A 04/26/2015    Procedure: Extraction of tooth #'s 6,11,12,15,20,21,22,27,28 with alveoloplasty;  Surgeon: [name], DDS;  Location: [facility];  Service: Oral Surgery;  Laterality: N/A;  . Radiology with anesthesia N/A 08/09/2015    Procedure: MRI LUMBAR WITH/WITHOUT CONTRAST, CERVICAL WITH CONTRAST  (RADIOLOGY WITH ANESTHESIA);  Surgeon: Medication Radiologist, MD;  Location: [facility];  Service: Radiology;  Laterality: N/A;    FAMILY HISTORY: family history includes Cancer in his brother and father; Heart disease in his mother.  SOCIAL HISTORY:  reports that he has never smoked. He has never used smokeless tobacco. He reports that he drinks alcohol. He reports that he does not use illicit drugs.  ALLERGIES: Morphine and related  MEDICATIONS:  Current Outpatient Prescriptions  Medication Sig Dispense Refill  . amLODipine (NORVASC) 10 MG tablet Take 1 tablet (10 mg total) by mouth daily. 60 tablet 1  . Armodafinil 250 MG tablet Take 250 mg by mouth daily.    Marland Kitchen aspirin EC 81 MG tablet Take 81 mg by mouth daily.    Marland Kitchen  fluticasone (FLONASE) 50 MCG/ACT nasal spray Place 2 sprays into both nostrils daily. 16 g 0  . HYDROmorphone (DILAUDID) 4 MG tablet Take 1 tablet (4 mg total) by mouth every 4 (four) hours as needed for severe pain. 90 tablet 0  . ibuprofen (ADVIL,MOTRIN) 200 MG tablet Take 600 mg by mouth every 6 (six) hours as needed for mild pain or moderate pain.     Marland Kitchen lansoprazole (PREVACID) 30 MG capsule Take 30 mg by mouth daily at 12 noon.    . Magnesium Oxide 400 MG CAPS  Take 1 capsule (400 mg total) by mouth daily. 30 capsule 0  . methadone (DOLOPHINE) 10 MG tablet Take 1 tablet (10 mg total) by mouth every 8 (eight) hours. 90 tablet 0  . polyethylene glycol powder (GLYCOLAX/MIRALAX) powder Take 17 g by mouth 2 (two) times daily. 255 g 0  . gabapentin (NEURONTIN) 100 MG capsule Take 600 mg by mouth 2 (two) times daily. Reported on 04/02/2016     No current facility-administered medications for this encounter.    REVIEW OF SYSTEMS:  Notable for that above.   PHYSICAL EXAM:  height is 5\' 9"  (1.753 m) and weight is 220 lb 9.6 oz (100.064 kg). His temperature is 98.2 F (36.8 C). His blood pressure is 159/91 and his pulse is 64. His oxygen saturation is 100%.   General: Alert and oriented, in no acute distress HEENT: Head is normocephalic. Oropharynx is clear. Tender in left occipital region. Tympanic membranes clear.  Neck: Neck is supple, with anterior lymphedema, no obvious palpable cervical or supraclavicular lymphadenopathy. Heart: Regular in rate and rhythm with no murmurs, rubs, or gallops. Chest: Clear to auscultation bilaterally, with no rhonchi, wheezes, or rales. Abdomen: Soft, nontender, nondistended, with no rigidity or guarding. Lymphatics: see Neck Exam Skin: No concerning lesions. Musculoskeletal: ambulatory; Right rib lesion anteriorly is tender to palpation  Neurologic:  No obvious focalities. Speech is fluent.  Psychiatric: Judgment and insight are intact. Affect is appropriate.   ECOG = 1  0 - Asymptomatic (Fully active, able to carry on all predisease activities without restriction)  1 - Symptomatic but completely ambulatory (Restricted in physically strenuous activity but ambulatory and able to carry out work of a light or sedentary nature. For example, light housework, office work)  2 - Symptomatic, <50% in bed during the day (Ambulatory and capable of all self care but unable to carry out any work activities. Up and about more than  50% of waking hours)  3 - Symptomatic, >50% in bed, but not bedbound (Capable of only limited self-care, confined to bed or chair 50% or more of waking hours)  4 - Bedbound (Completely disabled. Cannot carry on any self-care. Totally confined to bed or chair)  5 - Death   Eustace Pen MM, Creech RH, Tormey DC, et al. [phone]). "Toxicity and response criteria of the [facility]". [place] Oncol. 5 (6): 649-55   LABORATORY DATA:  Lab Results  Component Value Date   WBC 6.1 03/04/2016   HGB 13.5 03/04/2016   HCT 41.6 03/04/2016   MCV 87.9 03/04/2016   PLT 168 03/04/2016   CMP     Component Value Date/Time   NA 141 03/04/2016 1113   NA 139 10/01/2015 0957   K 4.1 03/04/2016 1113   K 4.3 10/01/2015 0957   CL 104 10/01/2015 0957   CO2 24 03/04/2016 1113   CO2 25 10/01/2015 0957   GLUCOSE 127 03/04/2016 1113   GLUCOSE  121* 10/01/2015 0957   BUN 24.0 03/04/2016 1113   BUN 23 10/01/2015 0957   CREATININE 1.4* 03/04/2016 1113   CREATININE 1.22 10/01/2015 0957   CREATININE 1.01 08/12/2015 0244   CALCIUM 9.5 03/04/2016 1113   CALCIUM 9.2 10/01/2015 0957   PROT 7.1 03/04/2016 1113   PROT 7.0 08/12/2015 0244   ALBUMIN 3.6 03/04/2016 1113   ALBUMIN 2.9* 08/12/2015 0244   AST 15 03/04/2016 1113   AST 20 08/12/2015 0244   ALT 13 03/04/2016 1113   ALT 20 08/12/2015 0244   ALKPHOS 136 03/04/2016 1113   ALKPHOS 83 08/12/2015 0244   BILITOT 0.78 03/04/2016 1113   BILITOT 0.8 08/12/2015 0244   GFRNONAA >60 08/12/2015 0244   GFRAA >60 08/12/2015 0244         RADIOGRAPHY: Nm Pet Image Restag (ps) Skull Base To Thigh  03/24/2016  CLINICAL DATA:  Subsequent treatment strategy for tongue carcinoma with metastatic disease to bone and supraclavicular lymph node. EXAM: NUCLEAR MEDICINE PET SKULL BASE TO THIGH TECHNIQUE: 11.2 mCi F-18 FDG was injected intravenously. Full-ring PET imaging was performed from the skull base to thigh after the radiotracer. CT data was obtained  and used for attenuation correction and anatomic localization. FASTING BLOOD GLUCOSE:  Value: 133 mg/dl COMPARISON:  Multiple exams, including 01/14/2016 FINDINGS: NECK New abnormal focus of hypermetabolic activity along the left semi spinalis capitis muscle, maximum standard uptake value 8.1, possibly an muscular metastatic lesion or an intramuscular lymph node. Newly hypermetabolic 5 mm in short axis lymph node along the deep margin of the left parotid gland, maximum standard uptake value 5.4, compatible with malignancy. Left level Ib lymph node short axis diameter 1.1 cm, maximum standard uptake value 4.8, previously not hypermetabolic. Small right level Ib lymph nodes are clustered and have a maximum standard uptake value of 4.2. Left level III lymph node maximum standard uptake value 7.5 at about the level of the glottis, previously approximately 4.8. More laterally at this level several lymph nodes have a maximum standard uptake value of 2 4.4, previously 8.5. A small level IV lymph node on the left has maximum standard uptake value 3.6, faintly increased. There is some asymmetric prominence of the right posterior tongue base compared to the left but without hypermetabolic activity directly in this vicinity. There is some asymmetry of the glottis with the right cord maximum standard uptake value 6.6 in the left approximately 4.3. Soft tissue swelling along the lower chin region. CHEST No hypermetabolic mediastinal or hilar nodes. No suspicious pulmonary nodules on the CT scan. Mild cardiomegaly with coronary atherosclerosis. Biapical pleural parenchymal scarring. ABDOMEN/PELVIS Stranding in the mesentery noted with a 6 mm in short axis mesenteric lymph node on image 134/4 with maximum standard uptake value 5.4, previous maximum standard uptake value of this nodule 17.9. This was previously referred to as being a small bowel lesion but I believe it actually rib is a mesenteric node. Small bilateral  nonobstructive renal calculi. SKELETON Further enlargement of the destructive right anterior fifth rib lesion with prominent extraosseous soft tissue involvement, currently this lesion measures 3.2 by 5.2 cm (formerly approximately 1.9 by 2.3 cm) and has a maximum standard uptake value of 25.2 (formerly 15.3) Along the inferior articular facet of L1, focal hypermetabolic activity observed with maximum standard uptake value 10.0 (formerly 10.4) compatible with a metastatic lesion. Along the right posterior iliac bone, there is a small lytic metastatic lesion with maximum standard uptake value 9.9 (a former measurement 8.2). Focal hypermetabolic activity  in the right T10 pedicle, maximum standard uptake value 6.0. This appears to be new. Previous hypermetabolic activity anteriorly in the T11 vertebral body has resolved. The previous lytic lesion in this vicinity is no sclerotic. IMPRESSION: 1. Generally progressive malignancy, various new areas of lymph node involvement in the neck and supraclavicular region, significant progression in the destructive right anterior fifth rib lesion, new right T10 pedicle lesion, and some stable involvement of other levels. Despite the general progression, there have been some areas where the involvement is improved. For example, anteriorly in the T11 vertebral body the previously lytic hypermetabolic lesion is not sclerotic and not hypermetabolic. Moreover, and mesenteric lymph node with surrounding mesenteric stranding has decreased in hypermetabolic activity, previously 17.9 and currently 5.4 (this was previously reported as being in small bowel but I favor it as being a mesenteric lymph node). 2. There is some asymmetry of the glottis with the right cord maximum standard uptake value up to 6.6 and the left approximately 4.3. This may be incidental but merits observation. 3. Other imaging findings of potential clinical significance: Mild cardiomegaly with coronary atherosclerosis.  Small bilateral nonobstructive renal calculi. Electronically Signed   By: Van Clines M.D.   On: 03/24/2016 11:48      IMPRESSION/PLAN: Today, I talked to the patient about the findings and work-up thus far. WE reviewed his PET images.  His symptoms of left occipital pain, radiating to the left upper neck are likely related to the soft tissue SUV uptake in this region. Right rib lesion anteriorly is tender to palpation and noted on PET.  We discussed the patient's diagnosis of metastatic base of tongue and general treatment for this, highlighting the role of radiotherapy in the management. We discussed the available radiation techniques, and focused on the details of logistics and delivery.   I recommended 2 weeks of palliative RT to the left occipital and right rib regions.  He'd like to start ASAP.  We discussed the risks, benefits, and side effects of radiotherapy. Side effects may include but not necessarily be limited to: skin irritation, fatigue, bone injury, hair loss. No guarantees of treatment were given. A consent form was signed and placed in the patient's medical record. The patient was encouraged to ask questions that I answered to the best of my ability.   Simulation scheduled for 04-07-16     __________________________________________   Eppie Gibson, MD

## 2016-04-02 ENCOUNTER — Ambulatory Visit
Admission: RE | Admit: 2016-04-02 | Discharge: 2016-04-02 | Disposition: A | Discharge status: Home / Self Care | Payer: PPO | Source: Ambulatory Visit | Attending: Radiation Oncology | Admitting: Radiation Oncology

## 2016-04-02 ENCOUNTER — Encounter: Payer: Self-pay | Admitting: *Deleted

## 2016-04-02 ENCOUNTER — Encounter: Payer: Self-pay | Admitting: Radiation Oncology

## 2016-04-02 VITALS — BP 159/91 | HR 64 | Temp 98.2°F | Ht 69.0 in | Wt 220.6 lb

## 2016-04-02 DIAGNOSIS — I10 Essential (primary) hypertension: Secondary | ICD-10-CM | POA: Diagnosis not present

## 2016-04-02 DIAGNOSIS — Z9889 Other specified postprocedural states: Secondary | ICD-10-CM | POA: Diagnosis not present

## 2016-04-02 DIAGNOSIS — Z79899 Other long term (current) drug therapy: Secondary | ICD-10-CM | POA: Insufficient documentation

## 2016-04-02 DIAGNOSIS — G8929 Other chronic pain: Secondary | ICD-10-CM | POA: Insufficient documentation

## 2016-04-02 DIAGNOSIS — C7951 Secondary malignant neoplasm of bone: Secondary | ICD-10-CM | POA: Insufficient documentation

## 2016-04-02 DIAGNOSIS — Z7982 Long term (current) use of aspirin: Secondary | ICD-10-CM | POA: Insufficient documentation

## 2016-04-02 DIAGNOSIS — Z923 Personal history of irradiation: Secondary | ICD-10-CM | POA: Diagnosis not present

## 2016-04-02 DIAGNOSIS — I2510 Atherosclerotic heart disease of native coronary artery without angina pectoris: Secondary | ICD-10-CM | POA: Diagnosis not present

## 2016-04-02 DIAGNOSIS — C01 Malignant neoplasm of base of tongue: Secondary | ICD-10-CM | POA: Insufficient documentation

## 2016-04-02 DIAGNOSIS — N200 Calculus of kidney: Secondary | ICD-10-CM | POA: Diagnosis not present

## 2016-04-02 DIAGNOSIS — C801 Malignant (primary) neoplasm, unspecified: Secondary | ICD-10-CM | POA: Insufficient documentation

## 2016-04-02 DIAGNOSIS — Z87442 Personal history of urinary calculi: Secondary | ICD-10-CM | POA: Diagnosis not present

## 2016-04-02 DIAGNOSIS — Z510 Encounter for antineoplastic radiation therapy: Secondary | ICD-10-CM | POA: Insufficient documentation

## 2016-04-02 NOTE — Addendum Note (Signed)
Encounter addended by: Ernst Spell, RN on: 04/02/2016 10:18 AM<BR>     Documentation filed: Charges VN

## 2016-04-04 ENCOUNTER — Telehealth: Payer: Self-pay | Admitting: *Deleted

## 2016-04-04 ENCOUNTER — Telehealth: Payer: Self-pay | Admitting: Hematology and Oncology

## 2016-04-04 NOTE — Telephone Encounter (Signed)
On 04-04-16 fax medical records to [facility], it was consult note, sim & planning note, end of tx note, out pt re- consult note.

## 2016-04-04 NOTE — Telephone Encounter (Signed)
Faxed records to [facility] [phone] release id)

## 2016-04-06 NOTE — Progress Notes (Signed)
  Oncology Nurse Navigator Documentation  Navigator Location: [facility] (04/02/16 0900)             Patient Visit Type: RadOnc;Follow-up (04/02/16 0900)                    Acuity: Level 1 (04/02/16 0900)       Met with Shane Cantu during appt with Dr. Isidore Moos to discuss palliative RT for metastatic disease to the back of his neck and R rib area.  He was accompanied by [name] and toddler grandson.  Prior to Dr. Pearlie Oyster arrival, we discussed the current state off his cancer and the current option for palliative radiation.  They acknowledged understanding that additional RT is intended to improve the quality of his life.  [name] questioned his request for an additional opinion at [facility] which is scheduled for next week.  I supported his decision, discussed the option of clinical trials they may have underway that may be available for his consideration.  They understand CT SIM has been scheduled for next Monday, 04/07/16 at 0800.  They understand I will continue to be available throughout this next phase of treatment.  They expressed appreciation for my ongoing support over this past year since his initial diagnosis.  Gayleen Orem, RN, BSN, [facility] [place] [phone]      Time Spent with Patient: 60 (04/02/16 0900)

## 2016-04-07 ENCOUNTER — Other Ambulatory Visit: Payer: Self-pay

## 2016-04-07 ENCOUNTER — Ambulatory Visit
Admission: RE | Admit: 2016-04-07 | Discharge: 2016-04-07 | Disposition: A | Discharge status: Home / Self Care | Payer: PPO | Source: Ambulatory Visit | Attending: Radiation Oncology | Admitting: Radiation Oncology

## 2016-04-07 ENCOUNTER — Encounter: Payer: Self-pay | Admitting: *Deleted

## 2016-04-07 DIAGNOSIS — C7951 Secondary malignant neoplasm of bone: Secondary | ICD-10-CM

## 2016-04-07 DIAGNOSIS — Z510 Encounter for antineoplastic radiation therapy: Secondary | ICD-10-CM | POA: Diagnosis not present

## 2016-04-07 DIAGNOSIS — C801 Malignant (primary) neoplasm, unspecified: Secondary | ICD-10-CM | POA: Diagnosis not present

## 2016-04-07 NOTE — Patient Outreach (Signed)
[facility]) Care Management  04/07/2016  Shane Cantu [facility] [birth year] [id number]   Telephone call to patient regarding health team advantage high risk referral. Unable to reach patient. HIPAA compliant voice message left with call back phone number.   PLAN;  RNCM will attempt 2nd telephone call to patient within  1 week.  [name] RN,BSN,CCM [facility] Telephonic  [phone]

## 2016-04-07 NOTE — Patient Outreach (Signed)
[place]) Care Management  04/07/2016  Ziyah Setterlund [facility] [birth year] [id number]   SUBJECTIVE: Telephone call to patient regarding health team advantage high risk referral. HIPAA verified with patient. Discussed and offered [facility] care management services to patient. 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  Patient states he is on his second treatment.   Patient states he is able to get his medications but states they are becoming expensive for him. Patient states he is on disability.  Patient states he was told he should apply for Medicaid. Patient states he does not want to go to the Medicaid office and sit all day.  Patient states he has back problems. Patient states he has had 4 back and neck surgeries.   RNCM discussed with patient [facility] care management social work assistance with Medicaid application.  RNCM discussed [facility] pharmacy services to help with medication assistance.  Patient refused services. Patient states he would like to talk with his daughter first before making a decision about receiving [facility] care management services.  Patient verbally agreed to receive [facility] care management outreach letter and brochure.  RNCM contacted Dr. Rayford Halsted office and spoke with Aldona Bar.  Notified Aldona Bar of patients refusal of services and requested assistance with engaging patient to [facility] care management program.   ASSESSMENT:  Health team advantage high risk referral.   PLAN:  RNCM will refer patient to Josepha Pigg to close due to refusal of services.  RNCM will notify patients primary MD office of refusal of services.  RNCM will send patient [facility] care management outreach letter and brochure.  [name] RN,BSN,CCM [facility] Telephonic  [phone]

## 2016-04-07 NOTE — Progress Notes (Signed)
  Radiation Oncology         (336) [phone] ________________________________  Name: Shane Cantu MRN: [id number]  Date: 04/07/2016  DOB: 10/23/1956  SIMULATION AND TREATMENT PLANNING NOTE and Special Treatment Procedure Note  Outpatient  DIAGNOSIS:     ICD-9-CM ICD-10-CM   1. Bone metastases (HCC) 198.5 C79.51     NARRATIVE:  The patient was brought to the [facility].  Identity was confirmed.  All relevant records and images related to the planned course of therapy were reviewed.  The patient freely provided informed written consent to proceed with treatment after reviewing the details related to the planned course of therapy. The consent form was witnessed and verified by the simulation staff.    Then, the patient was set-up in a stable reproducible  supine position for radiation therapy.  CT images were obtained.  Surface markings were placed.  The CT images were loaded into the planning software.    TREATMENT PLANNING NOTE: Treatment planning then occurred.  The radiation prescription was entered and confirmed.    A total of 5 medically necessary complex treatment devices were fabricated and supervised by me, in the form of aquaplast mask to immobilize head and 4 fields with MLCs to block brain, parotid tissue, cord, lungs, oral cavity.Marland Kitchen MORE FIELDS WITH MLCs MAY BE ADDED IN DOSIMETRY for dose homogeneity.  I have requested : 3D Simulation  I have requested a DVH of the following structures: brain, parotid tissue, cord, lungs, oral cavity.    IMRT will be used if needed to adequately avoid prior neck RT fields.  The patient will receive 30 Gy in 10 fractions to posterior left neck and right anterior ribcage   Special Treatment Procedure Note: The patient received prior radiotherapy close to his current fields. There could be some overlap of radiation dose.  Prior regional radiotherapy increases the risk of side effects from treatment. I have considered this in the  treatment planning process and have aimed to minimize tissue overlap.  This increases the complexity of this patient's treatment and therefore this constitutes a special treatment procedure.   -----------------------------------  Eppie Gibson, MD

## 2016-04-07 NOTE — Progress Notes (Signed)
[place] Psychosocial Distress Screening Clinical Social Work  Clinical Social Work was referred by distress screening protocol.  The patient scored a 5 and a 3 on the Psychosocial Distress Thermometer which indicates moderate distress. Clinical Social Worker met with patient in [place] office to assess for distress and other psychosocial needs. CSW familiar with patient from previous treatment visits.  Mr. Casimir informed CSW he would be referred to Hospice and Palliative Care after radiation treatment.  CSW explored patient's thoughts on new transition and discussed benefits of hospice care.  Mr. Esper reported coping adequately with change in treatment plan.  CSW encouraged patient to follow up as needed.  [facility] DISTRESS SCREENING 04/02/2016  Screening Type Initial Screening  Distress experienced in past week (1-10) 5  Physical Problem type Pain;Loss of appetitie  Physician notified of physical symptoms Yes  Referral to clinical psychology    [facility] DISTRESS SCREENING 05/02/2015  Screening Type Initial Screening  Distress experienced in past week (1-10) 3  Physical Problem type Pain;Sleep/insomnia;Constipation/diarrhea;Tingling hands/feet  Physician notified of physical symptoms Yes  Referral to clinical psychology No     Polo Riley, MSW, LCSW, OSW-C Clinical Social Worker [place] [phone]

## 2016-04-08 ENCOUNTER — Telehealth: Payer: Self-pay | Admitting: Hematology and Oncology

## 2016-04-08 NOTE — Telephone Encounter (Signed)
Faxed pt medical records to [facility]

## 2016-04-09 ENCOUNTER — Ambulatory Visit: Payer: PPO

## 2016-04-09 ENCOUNTER — Other Ambulatory Visit: Payer: Self-pay

## 2016-04-09 ENCOUNTER — Encounter: Payer: Self-pay | Admitting: Hematology and Oncology

## 2016-04-09 ENCOUNTER — Ambulatory Visit: Payer: PPO | Admitting: Radiation Oncology

## 2016-04-09 DIAGNOSIS — Z510 Encounter for antineoplastic radiation therapy: Secondary | ICD-10-CM | POA: Diagnosis not present

## 2016-04-13 ENCOUNTER — Encounter (HOSPITAL_COMMUNITY): Payer: Self-pay | Admitting: Emergency Medicine

## 2016-04-13 ENCOUNTER — Emergency Department (HOSPITAL_COMMUNITY)
Admission: EM | Admit: 2016-04-13 | Discharge: 2016-04-14 | Disposition: A | Discharge status: Home / Self Care | Payer: PPO | Attending: Emergency Medicine | Admitting: Emergency Medicine

## 2016-04-13 DIAGNOSIS — S2232XA Fracture of one rib, left side, initial encounter for closed fracture: Secondary | ICD-10-CM | POA: Insufficient documentation

## 2016-04-13 DIAGNOSIS — G629 Polyneuropathy, unspecified: Secondary | ICD-10-CM | POA: Insufficient documentation

## 2016-04-13 DIAGNOSIS — X58XXXA Exposure to other specified factors, initial encounter: Secondary | ICD-10-CM | POA: Diagnosis not present

## 2016-04-13 DIAGNOSIS — Y929 Unspecified place or not applicable: Secondary | ICD-10-CM | POA: Insufficient documentation

## 2016-04-13 DIAGNOSIS — R109 Unspecified abdominal pain: Secondary | ICD-10-CM | POA: Diagnosis present

## 2016-04-13 DIAGNOSIS — I10 Essential (primary) hypertension: Secondary | ICD-10-CM | POA: Insufficient documentation

## 2016-04-13 DIAGNOSIS — Z79899 Other long term (current) drug therapy: Secondary | ICD-10-CM | POA: Insufficient documentation

## 2016-04-13 DIAGNOSIS — Z85810 Personal history of malignant neoplasm of tongue: Secondary | ICD-10-CM | POA: Diagnosis not present

## 2016-04-13 DIAGNOSIS — M1990 Unspecified osteoarthritis, unspecified site: Secondary | ICD-10-CM | POA: Insufficient documentation

## 2016-04-13 DIAGNOSIS — Z791 Long term (current) use of non-steroidal anti-inflammatories (NSAID): Secondary | ICD-10-CM | POA: Diagnosis not present

## 2016-04-13 DIAGNOSIS — Y939 Activity, unspecified: Secondary | ICD-10-CM | POA: Insufficient documentation

## 2016-04-13 DIAGNOSIS — Y999 Unspecified external cause status: Secondary | ICD-10-CM | POA: Insufficient documentation

## 2016-04-13 DIAGNOSIS — C7951 Secondary malignant neoplasm of bone: Secondary | ICD-10-CM | POA: Diagnosis not present

## 2016-04-13 DIAGNOSIS — Z79891 Long term (current) use of opiate analgesic: Secondary | ICD-10-CM | POA: Insufficient documentation

## 2016-04-13 DIAGNOSIS — C801 Malignant (primary) neoplasm, unspecified: Secondary | ICD-10-CM | POA: Diagnosis not present

## 2016-04-13 DIAGNOSIS — Z7982 Long term (current) use of aspirin: Secondary | ICD-10-CM | POA: Insufficient documentation

## 2016-04-13 DIAGNOSIS — K219 Gastro-esophageal reflux disease without esophagitis: Secondary | ICD-10-CM | POA: Insufficient documentation

## 2016-04-13 NOTE — ED Notes (Signed)
Patient complaining started a few days ago and how gotten worse over the course of the last two days. Patient is having pain in left side and he says that it is grabbing. Patient has Cancer in his throat, right side, and his back.

## 2016-04-14 ENCOUNTER — Ambulatory Visit
Admission: RE | Admit: 2016-04-14 | Discharge: 2016-04-14 | Disposition: A | Discharge status: Home / Self Care | Payer: PPO | Source: Ambulatory Visit | Attending: Radiation Oncology | Admitting: Radiation Oncology

## 2016-04-14 ENCOUNTER — Encounter: Payer: Self-pay | Admitting: Radiation Oncology

## 2016-04-14 ENCOUNTER — Emergency Department (HOSPITAL_COMMUNITY): Payer: PPO

## 2016-04-14 ENCOUNTER — Telehealth: Payer: Self-pay | Admitting: *Deleted

## 2016-04-14 ENCOUNTER — Encounter (HOSPITAL_COMMUNITY): Payer: Self-pay

## 2016-04-14 VITALS — BP 111/66 | HR 61 | Temp 98.2°F | Ht 72.0 in | Wt 223.2 lb

## 2016-04-14 DIAGNOSIS — C7951 Secondary malignant neoplasm of bone: Secondary | ICD-10-CM

## 2016-04-14 DIAGNOSIS — C770 Secondary and unspecified malignant neoplasm of lymph nodes of head, face and neck: Secondary | ICD-10-CM

## 2016-04-14 DIAGNOSIS — Z510 Encounter for antineoplastic radiation therapy: Secondary | ICD-10-CM | POA: Diagnosis not present

## 2016-04-14 LAB — PROTIME-INR
INR: 1.35 (ref 0.00–1.49)
Prothrombin Time: 16.8 seconds — ABNORMAL HIGH (ref 11.6–15.2)

## 2016-04-14 LAB — BASIC METABOLIC PANEL
ANION GAP: 8 (ref 5–15)
BUN: 27 mg/dL — ABNORMAL HIGH (ref 6–20)
CHLORIDE: 105 mmol/L (ref 101–111)
CO2: 24 mmol/L (ref 22–32)
Calcium: 9.3 mg/dL (ref 8.9–10.3)
Creatinine, Ser: 1.35 mg/dL — ABNORMAL HIGH (ref 0.61–1.24)
GFR calc Af Amer: >60 mL/min (ref >60)
GFR calc non Af Amer: 55 mL/min — ABNORMAL LOW (ref >60)
GLUCOSE: 142 mg/dL — AB (ref 65–99)
POTASSIUM: 4.2 mmol/L (ref 3.5–5.1)
Sodium: 137 mmol/L (ref 135–145)

## 2016-04-14 LAB — CBC
HEMATOCRIT: 39.7 % (ref 39.0–52.0)
Hemoglobin: 13.1 g/dL (ref 13.0–17.0)
MCH: 28.1 pg (ref 26.0–34.0)
MCHC: 33 g/dL (ref 30.0–36.0)
MCV: 85.2 fL (ref 78.0–100.0)
Platelets: 212 10*3/uL (ref 150–400)
RBC: 4.66 MIL/uL (ref 4.22–5.81)
RDW: 13.4 % (ref 11.5–15.5)
WBC: 9.2 10*3/uL (ref 4.0–10.5)

## 2016-04-14 LAB — APTT: aPTT: 30 seconds (ref 24–37)

## 2016-04-14 LAB — TROPONIN I

## 2016-04-14 MED ORDER — IOPAMIDOL (ISOVUE-370) INJECTION 76%
100.0000 mL | Freq: Once | INTRAVENOUS | Status: AC | PRN
  Administered 2016-04-14: 100 mL via INTRAVENOUS

## 2016-04-14 MED ORDER — KETOROLAC TROMETHAMINE 10 MG PO TABS: 10.0000 mg | ORAL_TABLET | Freq: Four times a day (QID) | ORAL | Status: DC | PRN

## 2016-04-14 MED ORDER — HYDROMORPHONE HCL 1 MG/ML IJ SOLN
1.0000 mg | Freq: Once | INTRAMUSCULAR | Status: AC
  Administered 2016-04-14: 1 mg via INTRAVENOUS
  Filled 2016-04-14: qty 1

## 2016-04-14 MED ORDER — SONAFINE EX EMUL
1.0000 "application " | Freq: Once | CUTANEOUS | Status: AC
  Administered 2016-04-14: 1 via TOPICAL
  Filled 2016-04-14: qty 45

## 2016-04-14 MED ORDER — KETOROLAC TROMETHAMINE 30 MG/ML IJ SOLN
30.0000 mg | Freq: Once | INTRAMUSCULAR | Status: AC
  Administered 2016-04-14: 30 mg via INTRAVENOUS
  Filled 2016-04-14: qty 1

## 2016-04-14 NOTE — Progress Notes (Signed)
Pt here for patient teaching.  Pt given Radiation and You booklet, skin care instructions and Sonafine. Pt reports they have not watched the Radiation Therapy Education video, but were given the link to watch at home.  Reviewed areas of pertinence such as fatigue, skin changes, throat changes, breast swelling, cough, shortness of breath, earaches and taste changes . Pt able to give teach back of to pat skin, use unscented/gentle soap and drink plenty of water,apply Sonafine bid and avoid applying anything to skin within 4 hours of treatment. Pt verbalizes understanding of information given and will contact nursing with any questions or concerns.     Http://rtanswers.org/treatmentinformation/whattoexpect/index

## 2016-04-14 NOTE — ED Provider Notes (Signed)
CSN: [id number]     Arrival date & time 04/13/16  2215 History  By signing my name below, I, [facility], attest that this documentation has been prepared under the direction and in the presence of Noemi Chapel, MD. Electronically Signed: Virgel Bouquet, ED Scribe. 04/14/2016. 2:33 AM.   Chief Complaint  Patient presents with  . Flank Pain   The history is provided by the patient. No language interpreter was used.   HPI Comments: Shane Cantu is a 60 y.o. male who presents to the Emergency Department complaining of constant, gradually worsening left flank pain onset 2 days ago. Pain is worse with deep inspiration and expiration. 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 He has had both an MRI and PET scan in the past month. Denies cough, SOB, fever, chills, nausea, vomiting, BLE edema different from baseline without asymmetry.  Past Medical History  Diagnosis Date  . Chronic pain   . Back pain   . Chronic neck pain   . History of kidney stones   . Allergic rhinitis   . Hypertension   . Migraine headache without aura   . Sleep apnea     does not use cpap  . GERD (gastroesophageal reflux disease)   . Neuropathy ([place])   . Arthritis   . Cancer ([place])      may 2016 tongue cancer  . Bacterial conjunctivitis of left eye 07/26/2015  . Pharyngitis, acute 09/06/2015  . Chronic fatigue 01/11/2016   Past Surgical History  Procedure Laterality Date  . Back surgery  03/1993, 10/2002    Dr. Louanne Skye  . Tonsillectomy      as a child  . Neck surgery  2004    Dr. Louanne Skye  . Lymph node biopsy    . Nasal sinusotomy  1977  . Colonoscopy    . Multiple extractions with alveoloplasty N/A 04/26/2015    Procedure: Extraction of tooth #'s 6,11,12,15,20,21,22,27,28 with alveoloplasty;  Surgeon: [name], DDS;  Location: [facility];  Service: Oral Surgery;  Laterality: N/A;  . Radiology with anesthesia N/A 08/09/2015   Procedure: MRI LUMBAR WITH/WITHOUT CONTRAST, CERVICAL WITH CONTRAST  (RADIOLOGY WITH ANESTHESIA);  Surgeon: Medication Radiologist, MD;  Location: [facility];  Service: Radiology;  Laterality: N/A;   Family History  Problem Relation Age of Onset  . Cancer Father     throat ca  . Cancer Brother     pituitary ca  . Heart disease Mother    Social History  Substance Use Topics  . Smoking status: Never Smoker   . Smokeless tobacco: Never Used  . Alcohol Use: Yes     Comment: once a month    Review of Systems  Constitutional: Negative for fever and chills.  Respiratory: Negative for cough and shortness of breath.   Cardiovascular: Negative for leg swelling (baseline).  Gastrointestinal: Negative for nausea and vomiting.  Genitourinary: Positive for flank pain (left flank).  All other systems reviewed and are negative.  Allergies  Morphine and related  Home Medications   Prior to Admission medications   Medication Sig Start Date End Date Taking? Authorizing Provider  amLODipine (NORVASC) 10 MG tablet Take 1 tablet (10 mg total) by mouth daily. 03/05/16  Yes [name], MD  Armodafinil 250 MG tablet Take 250 mg by mouth daily. Reported on 04/13/2016   Yes Historical Provider, MD  aspirin EC 81 MG tablet Take 81 mg by  mouth daily.   Yes Historical Provider, MD  celecoxib (CELEBREX) 200 MG capsule Take 200 mg by mouth daily.  04/01/16  Yes Historical Provider, MD  fluticasone (FLONASE) 50 MCG/ACT nasal spray Place 2 sprays into both nostrils daily. Patient taking differently: Place 2 sprays into both nostrils 2 (two) times daily as needed for allergies.  07/24/15  Yes Florencia Reasons, MD  HYDROmorphone (DILAUDID) 4 MG tablet Take 1 tablet (4 mg total) by mouth every 4 (four) hours as needed for severe pain. 03/25/16  Yes [name], MD  lansoprazole (PREVACID) 30 MG capsule Take 30 mg by mouth daily at 12 noon.   Yes Historical Provider, MD  Magnesium Oxide 400 MG CAPS Take 1 capsule (400 mg total) by  mouth daily. Patient taking differently: Take 400 mg by mouth daily as needed (arthritis pain).  07/24/15  Yes Florencia Reasons, MD  methadone (DOLOPHINE) 10 MG tablet Take 1 tablet (10 mg total) by mouth every 8 (eight) hours. 03/05/16  Yes [name], MD  polyethylene glycol powder (GLYCOLAX/MIRALAX) powder Take 17 g by mouth 2 (two) times daily. Patient taking differently: Take 17 g by mouth 2 (two) times daily as needed for mild constipation.  04/06/13  Yes Veryl Speak, MD  ketorolac (TORADOL) 10 MG tablet Take 1 tablet (10 mg total) by mouth every 6 (six) hours as needed. 04/14/16   Noemi Chapel, MD   BP 153/70 mmHg  Pulse 70  Temp(Src) 98.9 F (37.2 C) (Oral)  Resp 17  Ht 6' (1.829 m)  Wt 223 lb 5.2 oz (101.3 kg)  BMI 30.28 kg/m2  SpO2 93% Physical Exam  Constitutional: He appears well-developed and well-nourished. No distress.  HENT:  Head: Normocephalic and atraumatic.  Mouth/Throat: Oropharynx is clear and moist. No oropharyngeal exudate.  No masses visualized in the OP.  Eyes: Conjunctivae and EOM are normal. Pupils are equal, round, and reactive to light. Right eye exhibits no discharge. Left eye exhibits no discharge. No scleral icterus.  Neck: Normal range of motion. Neck supple. No JVD present. No thyromegaly present.  Cardiovascular: Normal rate, regular rhythm, normal heart sounds and intact distal pulses.  Exam reveals no gallop and no friction rub.   No murmur heard. Pulmonary/Chest: Effort normal and breath sounds normal. No respiratory distress. He has no wheezes. He has no rales. He exhibits tenderness (Left ant axy line that extends minimally on to the LUQ).  Abdominal: Soft. Bowel sounds are normal. He exhibits no distension and no mass. There is no tenderness (other than mild LUQ).  Musculoskeletal: Normal range of motion. He exhibits no edema or tenderness.  Lymphadenopathy:    He has no cervical adenopathy.  Neurological: He is alert. Coordination normal.  Skin: Skin is  warm and dry. No rash noted. No erythema.  Psychiatric: He has a normal mood and affect. His behavior is normal.  Nursing note and vitals reviewed.   ED Course  Procedures  DIAGNOSTIC STUDIES: Oxygen Saturation is 98% on RA, normal by my interpretation.    COORDINATION OF CARE: 12:16 AM Will order CT angio of chest and labs. Discussed treatment plan with pt at bedside and pt agreed to plan.   Labs Review Labs Reviewed  BASIC METABOLIC PANEL - Abnormal; Notable for the following:    Glucose, Bld 142 (*)    BUN 27 (*)    Creatinine, Ser 1.35 (*)    GFR calc non Af Amer 55 (*)    All other components within normal limits  PROTIME-INR - Abnormal; Notable for the following:    Prothrombin Time 16.8 (*)    All other components within normal limits  CBC  PROTIME-INR  TROPONIN I  APTT    Imaging Review Ct Angio Chest Pe W/cm &/or Wo Cm  04/14/2016  CLINICAL DATA:  LEFT chest and flank pain for 2 days. History of head and neck cancer with known recent progression including known thoracic metastasis. EXAM: CT ANGIOGRAPHY CHEST WITH CONTRAST TECHNIQUE: Multidetector CT imaging of the chest was performed using the standard protocol during bolus administration of intravenous contrast. Multiplanar CT image reconstructions and MIPs were obtained to evaluate the vascular anatomy. CONTRAST:  100 cc Isovue 370 COMPARISON:  PET-CT March 24, 2016 FINDINGS: PULMONARY ARTERY: Adequate contrast opacification of the pulmonary artery's. Main pulmonary artery is not enlarged. No pulmonary arterial filling defects to the level of the subsegmental branches. MEDIASTINUM: Heart is moderately enlarged, unchanged. No pericardial effusions. Moderate coronary artery calcifications. Thoracic aorta is normal in course and caliber. No lymphadenopathy by CT size criteria. LUNGS: Tracheobronchial tree is patent and midline. Enhancing atelectasis in lower lobes. SOFT TISSUES AND OSSEOUS STRUCTURES: Included view of the  abdomen is unremarkable. Expansile lytic RIGHT anterior fifth rib fracture again noted, associated with stable 5.2 x 3.2 cm mass. Moderate to severe degenerative change of LEFT shoulder. Additional osseous metastasis as seen on prior PET-CT. New nondisplaced acute to subacute LEFT lateral eighth rib fracture. Review of the MIP images confirms the above findings. IMPRESSION: No acute pulmonary embolism. Stable cardiomegaly.  Increasing atelectasis, no definite pneumonia. New acute to subacute LEFT lateral eighth rib fracture ; no underlying lesion to suggest pathologic etiology. Stable RIGHT fifth rib metastasis with 5.2 x 3.2 cm soft tissue mass. Additional osseous metastasis as seen on recent PET-CT. Electronically Signed   By: Elon Alas M.D.   On: 04/14/2016 04:29   I have personally reviewed and evaluated these images and lab results as part of my medical decision-making.   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 The recorded information has been reviewed and is accurate.   The patient's CT scan shows that he has an isolated left-sided rib fracture. Thankfully there is no signs of underlying pneumonia, pneumothorax or pulmonary embolism. The patient was given his results, he expresses understanding, he appears very stable for discharge and has improved significantly after second dose of pain medication. I will add an anti-inflammatory to the regimen which she is artery taking including oral Dilaudid.  Meds given  in ED:  Medications  HYDROmorphone (DILAUDID) injection 1 mg (1 mg Intravenous Given 04/14/16 0243)  iopamidol (ISOVUE-370) 76 % injection 100 mL (100 mLs Intravenous Contrast Given 04/14/16 0406)  HYDROmorphone (DILAUDID) injection 1 mg (1 mg Intravenous Given 04/14/16 0438)  ketorolac (TORADOL) 30 MG/ML injection 30 mg (30 mg Intravenous Given 04/14/16 0504)    New Prescriptions   KETOROLAC (TORADOL) 10 MG TABLET    Take 1 tablet (10 mg total) by mouth every  6 (six) hours as needed.        Noemi Chapel, MD 04/14/16 [phone]

## 2016-04-14 NOTE — Progress Notes (Signed)
   Weekly Management Note:  Outpatient    ICD-9-CM ICD-10-CM   1. 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  CBCT/MVCT images/Port film x-rays were reviewed.  The chart was checked. He went to the ED this weekend with pain found to be due to a new left lateral rib fracture. No obvious radiologic sign of metastatic disease at fracture on CT or prior PET imaging.   Physical Findings:  height is 6' (1.829 m) and weight is 223 lb 3.2 oz (101.243 kg). His temperature is 98.2 F (36.8 C). His blood pressure is 111/66 and his pulse is 61.   Wt Readings from Last 3 Encounters:  04/14/16 223 lb 3.2 oz (101.243 kg)  04/13/16 223 lb 5.2 oz (101.3 kg)  04/02/16 220 lb 9.6 oz (100.064 kg)   NAD  Impression:  The patient is tolerating radiotherapy.  Plan:  Continue radiotherapy as planned. I don't see an obvious palliative indication for his left rib fracture at this time. Will follow.  ________________________________   Eppie Gibson, M.D.

## 2016-04-14 NOTE — Progress Notes (Signed)
Shane Cantu is here for his first fraction of radiation to his Right Rib, and Left Posterior neck. He reports pain in his Left Rib area, and was in the Emergency Room last night. He has a confirmed Left Rib fracture. He has several questions regarding the number of treatments he will receive, about cancer traveling to his new rib fracture, and a device that will "massage his neck" to prevent side effects from radiation.   BP 111/66 mmHg  Pulse 61  Temp(Src) 98.2 F (36.8 C)  Ht 6' (1.829 m)  Wt 223 lb 3.2 oz (101.243 kg)  BMI 30.26 kg/m2   Wt Readings from Last 3 Encounters:  04/14/16 223 lb 3.2 oz (101.243 kg)  04/13/16 223 lb 5.2 oz (101.3 kg)  04/02/16 220 lb 9.6 oz (100.064 kg)

## 2016-04-14 NOTE — ED Notes (Addendum)
Patient verbalized he wanted his wife/gf (IV team nurse to do his IV ).  Iv charted, [phone] with assistance

## 2016-04-14 NOTE — ED Notes (Signed)
In CT angio.

## 2016-04-14 NOTE — Discharge Instructions (Signed)

## 2016-04-14 NOTE — Telephone Encounter (Signed)
  Oncology Nurse Navigator Documentation  Navigator Location: [facility] On[facility] (04/14/16 0743) Navigator Encounter Type: Telephone (04/14/16 0743) Telephone: Jerilee Hoh Confirmation/Clarification (04/14/16 0743)                 Returned patient dtr's VMM re his trip to ED last evening d/t uncontrolled L-sided pain.  Per MD note, Addai has a L-rib fracture.  I reached Shanon Brow at home, he indicated he was working on his car when he thinks he sustained the fracture.  He commented his pain is not notably controlled s/p the ED visit.   I encouraged him to keep this afternoon's new start XRT, he acknowledged he plans to do so.  I encouraged a 12:00 arrival for registration.  He voiced understanding.  Gayleen Orem, RN, BSN, [place] at [place] [phone]                           Time Spent with Patient: 15 (04/14/16 0743)

## 2016-04-15 ENCOUNTER — Ambulatory Visit
Admission: RE | Admit: 2016-04-15 | Discharge: 2016-04-15 | Disposition: A | Discharge status: Home / Self Care | Payer: PPO | Source: Ambulatory Visit | Attending: Radiation Oncology | Admitting: Radiation Oncology

## 2016-04-15 DIAGNOSIS — Z510 Encounter for antineoplastic radiation therapy: Secondary | ICD-10-CM | POA: Diagnosis not present

## 2016-04-16 ENCOUNTER — Ambulatory Visit
Admission: RE | Admit: 2016-04-16 | Discharge: 2016-04-16 | Disposition: A | Discharge status: Home / Self Care | Payer: PPO | Source: Ambulatory Visit | Attending: Radiation Oncology | Admitting: Radiation Oncology

## 2016-04-16 DIAGNOSIS — Z510 Encounter for antineoplastic radiation therapy: Secondary | ICD-10-CM | POA: Diagnosis not present

## 2016-04-17 ENCOUNTER — Ambulatory Visit
Admission: RE | Admit: 2016-04-17 | Discharge: 2016-04-17 | Disposition: A | Discharge status: Home / Self Care | Payer: PPO | Source: Ambulatory Visit | Attending: Radiation Oncology | Admitting: Radiation Oncology

## 2016-04-17 DIAGNOSIS — Z510 Encounter for antineoplastic radiation therapy: Secondary | ICD-10-CM | POA: Diagnosis not present

## 2016-04-18 ENCOUNTER — Ambulatory Visit
Admission: RE | Admit: 2016-04-18 | Discharge: 2016-04-18 | Disposition: A | Discharge status: Home / Self Care | Payer: PPO | Source: Ambulatory Visit | Attending: Radiation Oncology | Admitting: Radiation Oncology

## 2016-04-18 DIAGNOSIS — Z510 Encounter for antineoplastic radiation therapy: Secondary | ICD-10-CM | POA: Diagnosis not present

## 2016-04-21 ENCOUNTER — Ambulatory Visit
Admission: RE | Admit: 2016-04-21 | Discharge: 2016-04-21 | Disposition: A | Discharge status: Home / Self Care | Payer: PPO | Source: Ambulatory Visit | Attending: Radiation Oncology | Admitting: Radiation Oncology

## 2016-04-21 ENCOUNTER — Encounter: Payer: Self-pay | Admitting: Radiation Oncology

## 2016-04-21 ENCOUNTER — Other Ambulatory Visit: Payer: Self-pay | Admitting: Radiation Oncology

## 2016-04-21 ENCOUNTER — Telehealth: Payer: Self-pay | Admitting: *Deleted

## 2016-04-21 VITALS — BP 132/93 | HR 78 | Temp 97.9°F | Ht 72.0 in | Wt 217.9 lb

## 2016-04-21 DIAGNOSIS — C7951 Secondary malignant neoplasm of bone: Secondary | ICD-10-CM

## 2016-04-21 DIAGNOSIS — Z510 Encounter for antineoplastic radiation therapy: Secondary | ICD-10-CM | POA: Diagnosis not present

## 2016-04-21 MED ORDER — METHADONE HCL 10 MG PO TABS: 10.0000 mg | ORAL_TABLET | Freq: Three times a day (TID) | ORAL | Status: DC

## 2016-04-21 NOTE — Progress Notes (Signed)
   Weekly Management Note:  Outpatient    ICD-9-CM ICD-10-CM   1. Bone metastases (HCC) 198.5 C79.51    RIGHT RIB Left POSTERIOR NECK  Current Dose:  18 Gy  Projected Dose: 30 Gy   Narrative:  The patient presents for routine under treatment assessment.  CBCT/MVCT images/Port film x-rays were reviewed.  The chart was checked. Left rib fracturefeels better. Right rib mass feels a bit better. Left neck still hurts posteriorly  Physical Findings:  height is 6' (1.829 m) and weight is 217 lb 14.4 oz (98.839 kg). His temperature is 97.9 F (36.6 C). His blood pressure is 132/93 and his pulse is 78. His oxygen saturation is 97%.   Wt Readings from Last 3 Encounters:  04/21/16 217 lb 14.4 oz (98.839 kg)  04/14/16 223 lb 3.2 oz (101.243 kg)  04/13/16 223 lb 5.2 oz (101.3 kg)   NAD - no skin irritation over neck.  Impression:  The patient is tolerating radiotherapy.  Plan:  Continue radiotherapy as planned. F/u here in 11mo. Advised to call [facility] to get his next med/onc appt for consideration of clinical trials. ________________________________   Eppie Gibson, M.D.

## 2016-04-21 NOTE — Progress Notes (Signed)
Shane Cantu is here for his 6th fraction of radiation to his Right Rib and Left Posterior Neck. He rates his pain a 7/10 in his Left Neck area. He is taking methadone twice daily. He is taking dilaudid about every 8 hours for his pain. He is also using ice over his Left neck area to help relieve his pain at night. He reports a good appetite. He has some constipation, and is taking the miralax, but states he may need to start taking something else like a stool softener. His skin is normal appearing, and he is using the sonafine twice daily. He also reports some anxiety and irritability recently which he relates to pain.   BP 132/93 mmHg  Pulse 78  Temp(Src) 97.9 F (36.6 C)  Ht 6' (1.829 m)  Wt 217 lb 14.4 oz (98.839 kg)  BMI 29.55 kg/m2  SpO2 97%   Wt Readings from Last 3 Encounters:  04/21/16 217 lb 14.4 oz (98.839 kg)  04/14/16 223 lb 3.2 oz (101.243 kg)  04/13/16 223 lb 5.2 oz (101.3 kg)

## 2016-04-21 NOTE — Telephone Encounter (Signed)
Pt requests refill on Methadone, states can pick up this after when he comes in for Radiation.  Informed pt [name] out of office this afternoon. She will be back in the morning.  Can have Rx ready for him to pick up tomorrow when he comes in for Radiation.  Pt agreed states he will pick up tomorrow.

## 2016-04-22 ENCOUNTER — Ambulatory Visit
Admission: RE | Admit: 2016-04-22 | Discharge: 2016-04-22 | Disposition: A | Discharge status: Home / Self Care | Payer: PPO | Source: Ambulatory Visit | Attending: Radiation Oncology | Admitting: Radiation Oncology

## 2016-04-22 DIAGNOSIS — Z510 Encounter for antineoplastic radiation therapy: Secondary | ICD-10-CM | POA: Diagnosis not present

## 2016-04-23 ENCOUNTER — Ambulatory Visit
Admission: RE | Admit: 2016-04-23 | Discharge: 2016-04-23 | Disposition: A | Discharge status: Home / Self Care | Payer: PPO | Source: Ambulatory Visit | Attending: Radiation Oncology | Admitting: Radiation Oncology

## 2016-04-23 DIAGNOSIS — Z510 Encounter for antineoplastic radiation therapy: Secondary | ICD-10-CM | POA: Diagnosis not present

## 2016-04-24 ENCOUNTER — Ambulatory Visit
Admission: RE | Admit: 2016-04-24 | Discharge: 2016-04-24 | Disposition: A | Discharge status: Home / Self Care | Payer: PPO | Source: Ambulatory Visit | Attending: Radiation Oncology | Admitting: Radiation Oncology

## 2016-04-24 DIAGNOSIS — Z510 Encounter for antineoplastic radiation therapy: Secondary | ICD-10-CM | POA: Diagnosis not present

## 2016-04-25 ENCOUNTER — Encounter: Payer: Self-pay | Admitting: Radiation Oncology

## 2016-04-25 ENCOUNTER — Ambulatory Visit: Payer: PPO

## 2016-04-25 ENCOUNTER — Ambulatory Visit
Admission: RE | Admit: 2016-04-25 | Discharge: 2016-04-25 | Disposition: A | Discharge status: Home / Self Care | Payer: PPO | Source: Ambulatory Visit | Attending: Radiation Oncology | Admitting: Radiation Oncology

## 2016-04-25 DIAGNOSIS — Z510 Encounter for antineoplastic radiation therapy: Secondary | ICD-10-CM | POA: Diagnosis not present

## 2016-04-29 ENCOUNTER — Ambulatory Visit: Payer: PPO

## 2016-04-30 ENCOUNTER — Ambulatory Visit: Payer: PPO

## 2016-05-01 ENCOUNTER — Ambulatory Visit: Payer: PPO

## 2016-05-01 ENCOUNTER — Telehealth: Payer: Self-pay | Admitting: *Deleted

## 2016-05-01 NOTE — Telephone Encounter (Signed)
  Oncology Nurse Navigator Documentation  Navigator Location: [facility] (05/01/16 1100) Navigator Encounter Type: Telephone (05/01/16 1100) Telephone: Incoming Call;Symptom Mgt (05/01/16 1100)                 Interventions: Coordination of Care (05/01/16 1100)     Received call from [place].  He reported concern for intensifying L neck pain since completion of RT last week 5/26:  Continuous, sharp back-of-neck, radiates from L ear to L neck to shoulder.  Severe enough this morning to cause nausea (no emesis).  Rates 9/10, resolves to 5/10 when takes dilaudid and methadone as prescribed.  Minimal relief from ice packs.  Difficulty sleeping. After conferring with Survivorship NP Mike Craze, arranged Follow-up appt with Dr. Isidore Moos tomorrow at 1040.  Lowella Fairy with appt information, he voiced understanding.  Gayleen Orem, RN, BSN, [place] at [place] [phone]                      Time Spent with Patient: 30 (05/01/16 1100)

## 2016-05-01 NOTE — Progress Notes (Signed)
Mr. Shane Cantu presents for follow up of radiation completed 04/25/16 to his Right Rib and Left Posterior Neck.   Pain issues, if any: He complains of sharp back of left neck pain which radiates from his Left ear to Left neck to his shoulder. He does have some visible swelling in this area. He is taking 4mg  dilaudid every 4 hours, and methadone 10 mg every 8 hours. He rates it a 5/10 today, and states it is better today than yesterday. This past week he states it was between a 8-10 during the day. He is also using ice to relieve the pain.  Using a feeding tube?: No Weight changes, if any:  Wt Readings from Last 3 Encounters:  05/02/16 217 lb 1.6 oz (98.476 kg)  04/21/16 217 lb 14.4 oz (98.839 kg)  04/14/16 223 lb 3.2 oz (101.243 kg)   Swallowing issues, if any: No Smoking or chewing tobacco? No Using fluoride trays daily? NA Last ENT visit was on: No recent visit. Not since diagnosis. Dr. Gaylyn Cheers Other notable issues, if any: None  BP 137/83 mmHg  Pulse 70  Temp(Src) 98 F (36.7 C)  Ht 6' (1.829 m)  Wt 217 lb 1.6 oz (98.476 kg)  BMI 29.44 kg/m2  SpO2 96%

## 2016-05-02 ENCOUNTER — Ambulatory Visit: Payer: PPO

## 2016-05-02 ENCOUNTER — Ambulatory Visit
Admission: RE | Admit: 2016-05-02 | Discharge: 2016-05-02 | Disposition: A | Discharge status: Home / Self Care | Payer: PPO | Source: Ambulatory Visit | Attending: Radiation Oncology | Admitting: Radiation Oncology

## 2016-05-02 ENCOUNTER — Other Ambulatory Visit: Payer: Self-pay | Admitting: Hematology and Oncology

## 2016-05-02 ENCOUNTER — Encounter: Payer: Self-pay | Admitting: Radiation Oncology

## 2016-05-02 VITALS — BP 137/83 | HR 70 | Temp 98.0°F | Ht 72.0 in | Wt 217.1 lb

## 2016-05-02 DIAGNOSIS — Y842 Radiological procedure and radiotherapy as the cause of abnormal reaction of the patient, or of later complication, without mention of misadventure at the time of the procedure: Secondary | ICD-10-CM | POA: Diagnosis not present

## 2016-05-02 DIAGNOSIS — S2231XA Fracture of one rib, right side, initial encounter for closed fracture: Secondary | ICD-10-CM | POA: Insufficient documentation

## 2016-05-02 DIAGNOSIS — C7951 Secondary malignant neoplasm of bone: Secondary | ICD-10-CM | POA: Diagnosis present

## 2016-05-02 DIAGNOSIS — Z7982 Long term (current) use of aspirin: Secondary | ICD-10-CM | POA: Diagnosis not present

## 2016-05-02 DIAGNOSIS — I517 Cardiomegaly: Secondary | ICD-10-CM | POA: Insufficient documentation

## 2016-05-02 DIAGNOSIS — C01 Malignant neoplasm of base of tongue: Secondary | ICD-10-CM

## 2016-05-02 DIAGNOSIS — M542 Cervicalgia: Secondary | ICD-10-CM | POA: Insufficient documentation

## 2016-05-02 NOTE — Addendum Note (Signed)
Encounter addended by: Ernst Spell, RN on: 05/02/2016  2:07 PM<BR>     Documentation filed: Charges VN

## 2016-05-02 NOTE — Progress Notes (Signed)
Radiation Oncology         [phone] ________________________________  Name: Shane Cantu MRN: [id number]  Date: 05/02/2016  DOB: 05-Jul-1956  Follow-Up Visit Note  Outpatient  CC: [name], MD  [name], MD  Diagnosis and Prior Radiotherapy:    ICD-9-CM ICD-10-CM   1. 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 Tippens presents for follow up of radiation completed 04/25/16 to his Right Rib and Left Posterior Neck. Rib fracture pain on left is better and right rib tumor pain has improved. He complains of sharp back of left neck pain which radiates from his Left ear to Left neck to his shoulder. He does have some visible swelling in this area. He is taking 4mg  dilaudid every 4 hours, and methadone 10 mg every 8 hours. He rates it a 5/10 today, and states it is better today than yesterday. This past week he states it was between a 8-10 during the day. He is also using ice to relieve the pain.   ALLERGIES:  is allergic to morphine and related.  Meds: Current Outpatient Prescriptions  Medication Sig Dispense Refill  . amLODipine (NORVASC) 10 MG tablet Take 1 tablet (10 mg total) by mouth daily. 60 tablet 1  . Armodafinil 250 MG tablet Take 250 mg by mouth daily. Reported on 04/21/2016    . aspirin EC 81 MG tablet Take 81 mg by mouth daily.    . celecoxib (CELEBREX) 200 MG capsule Take 200 mg by mouth daily.     . fluticasone (FLONASE) 50 MCG/ACT nasal spray Place 2 sprays into both nostrils daily. (Patient taking differently: Place 2 sprays into both  nostrils 2 (two) times daily as needed for allergies. ) 16 g 0  . HYDROmorphone (DILAUDID) 4 MG tablet Take 1 tablet (4 mg total) by mouth every 4 (four) hours as needed for severe pain. 90 tablet 0  . lansoprazole (PREVACID) 30 MG capsule Take 30 mg by mouth daily at 12 noon.    . Magnesium Oxide 400 MG CAPS Take 1 capsule (400 mg total) by mouth daily. (Patient taking differently: Take 400 mg by mouth daily as needed (arthritis pain). ) 30 capsule 0  . methadone (DOLOPHINE) 10 MG tablet Take 1 tablet (10 mg total) by mouth every 8 (eight) hours. 90 tablet 0  . polyethylene glycol powder (GLYCOLAX/MIRALAX) powder Take 17 g by mouth 2 (two) times daily. (Patient taking differently: Take 17 g by mouth 2 (two) times daily as needed for mild constipation. ) 255 g 0  . ketorolac (TORADOL) 10 MG tablet Take 1 tablet (10 mg total) by mouth every 6 (six) hours as needed. (Patient not taking: Reported on 04/14/2016) 20 tablet 0   No current facility-administered medications for this encounter.    Physical Findings: The patient is in no acute distress. Patient is alert and oriented. Well appearing   height is 6' (1.829 m) and weight is 217 lb 1.6 oz (98.476 kg). His temperature is 98 F (36.7 C).  His blood pressure is 137/83 and his pulse is 70. His oxygen saturation is 96%. Marland Kitchen     >Some swelling along left occipital scalp /upper posterior neck that is tender, skin is not irritated there >No skin irritation bilaterally over the rib cage   Lab Findings: Lab Results  Component Value Date   WBC 9.2 04/14/2016   HGB 13.1 04/14/2016   HCT 39.7 04/14/2016   MCV 85.2 04/14/2016   PLT 212 04/14/2016    Radiographic Findings: Ct Angio Chest Pe W/cm &/or Wo Cm  04/14/2016  CLINICAL DATA:  LEFT chest and flank pain for 2 days. History of head and neck cancer with known recent progression including known thoracic metastasis. EXAM: CT ANGIOGRAPHY CHEST WITH CONTRAST TECHNIQUE: Multidetector CT imaging of the  chest was performed using the standard protocol during bolus administration of intravenous contrast. Multiplanar CT image reconstructions and MIPs were obtained to evaluate the vascular anatomy. CONTRAST:  100 cc Isovue 370 COMPARISON:  PET-CT March 24, 2016 FINDINGS: PULMONARY ARTERY: Adequate contrast opacification of the pulmonary artery's. Main pulmonary artery is not enlarged. No pulmonary arterial filling defects to the level of the subsegmental branches. MEDIASTINUM: Heart is moderately enlarged, unchanged. No pericardial effusions. Moderate coronary artery calcifications. Thoracic aorta is normal in course and caliber. No lymphadenopathy by CT size criteria. LUNGS: Tracheobronchial tree is patent and midline. Enhancing atelectasis in lower lobes. SOFT TISSUES AND OSSEOUS STRUCTURES: Included view of the abdomen is unremarkable. Expansile lytic RIGHT anterior fifth rib fracture again noted, associated with stable 5.2 x 3.2 cm mass. Moderate to severe degenerative change of LEFT shoulder. Additional osseous metastasis as seen on prior PET-CT. New nondisplaced acute to subacute LEFT lateral eighth rib fracture. Review of the MIP images confirms the above findings. IMPRESSION: No acute pulmonary embolism. Stable cardiomegaly.  Increasing atelectasis, no definite pneumonia. New acute to subacute LEFT lateral eighth rib fracture ; no underlying lesion to suggest pathologic etiology. Stable RIGHT fifth rib metastasis with 5.2 x 3.2 cm soft tissue mass. Additional osseous metastasis as seen on recent PET-CT. Electronically Signed   By: Elon Alas M.D.   On: 04/14/2016 04:29    Impression/Plan: The patient is recovering well from the effects of radiation. However, he does have persistent posterior left neck pain that has been persisting  without the alleviation we hoped from radiotherapy. I will make him a referral for PT to address this issue. I will see him back on a PRN basis. He will be starting  systemic therapy soon with [facility] medical oncology.  _____________________________________   Eppie Gibson, MD  This document serves as a record of services personally performed by Eppie Gibson, MD. It was created on her behalf by Derek Mound, a trained medical scribe. The creation of this record is based on the scribe's personal observations and the provider's statements to them. This document has been checked and approved by the attending provider.

## 2016-05-02 NOTE — Progress Notes (Signed)
  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    Plan: The patient has completed radiation treatment. The patient will return to radiation oncology clinic for routine followup in one half month. I advised them to call or return sooner if they have any questions or concerns related to their recovery or treatment.  -----------------------------------  Eppie Gibson, MD   This document serves as a record of services personally performed by Eppie Gibson, MD. It was created on her behalf by Derek Mound, a trained medical scribe. The creation of this record is based on the scribe's personal observations and the provider's statements to them. This document has been checked and approved by the attending provider.

## 2016-05-05 ENCOUNTER — Ambulatory Visit: Payer: PPO

## 2016-05-07 ENCOUNTER — Ambulatory Visit: Payer: PPO | Admitting: Physical Therapy

## 2016-05-12 ENCOUNTER — Telehealth: Payer: Self-pay | Admitting: *Deleted

## 2016-05-12 MED ORDER — HYDROMORPHONE HCL 4 MG PO TABS: 4.0000 mg | ORAL_TABLET | ORAL | Status: DC | PRN

## 2016-05-12 NOTE — Telephone Encounter (Signed)
Pt requested refill on Dilaudid.  Informed pt of Rx ready to pick up and [name] wants pt to get future refills on pain meds from [place] since pt will be starting treatment there next week.  Pt verbalized understanding.

## 2016-05-13 ENCOUNTER — Ambulatory Visit: Payer: PPO | Attending: Radiation Oncology | Admitting: Physical Therapy

## 2016-05-13 DIAGNOSIS — I890 Lymphedema, not elsewhere classified: Secondary | ICD-10-CM | POA: Diagnosis not present

## 2016-05-13 DIAGNOSIS — M25512 Pain in left shoulder: Secondary | ICD-10-CM | POA: Diagnosis present

## 2016-05-13 DIAGNOSIS — R293 Abnormal posture: Secondary | ICD-10-CM | POA: Diagnosis present
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
## 2016-05-20 ENCOUNTER — Ambulatory Visit: Payer: PPO | Admitting: Physical Therapy

## 2016-05-22 ENCOUNTER — Encounter: Payer: Self-pay | Admitting: Radiation Oncology

## 2016-05-22 ENCOUNTER — Encounter: Payer: Self-pay | Admitting: Physical Therapy

## 2016-05-26 ENCOUNTER — Encounter: Payer: Self-pay | Admitting: Physical Therapy

## 2016-05-28 ENCOUNTER — Encounter: Payer: Self-pay | Admitting: Physical Therapy

## 2016-05-28 ENCOUNTER — Telehealth: Payer: Self-pay | Admitting: *Deleted

## 2016-05-28 ENCOUNTER — Ambulatory Visit: Payer: PPO | Admitting: Radiation Oncology

## 2016-05-28 NOTE — Telephone Encounter (Signed)
  Oncology Nurse Navigator Documentation  Navigator Location: [facility] (05/28/16 1438) Navigator Encounter Type: Telephone (05/28/16 1438) Telephone: Incoming Call (05/28/16 1438)                     Shane Cantu called this morning to elaborate on his VM from late Monday in which he stated he wishes to continue at [facility] the Taxotere regime initiated at [facility].  He noted [facility] is out-of-network with his insurance.  Following discussion with [name], I spoke with Shane Cantu this afternoon, informed him that [name] is willing to proceed with the Taxotere with his understanding of her previous discussion of benefit.  He voiced understanding.   He stated his initial treatment was 05/15/16, was told next dose is due 06/05/16.   He indicated he wants to talk with his daughter this evening, will call me tomorrow.    I informed [name].  Gayleen Orem, RN, BSN, [place] at [place] [phone]                         Time Spent with Patient: 30 (05/28/16 1438)

## 2016-05-28 NOTE — Telephone Encounter (Signed)
  Oncology Nurse Navigator Documentation  Navigator Location: [facility] (05/28/16 1526) Navigator Encounter Type: Telephone (05/28/16 1526) Telephone: Incoming Call (05/28/16 1526)                       Mr. Purnell called, stated he wishes to proceed with Taxotere treatment at [facility].  I updated [name].  Gayleen Orem, RN, BSN, [place] at [place] [phone]                     Time Spent with Patient: 15 (05/28/16 1526)

## 2016-05-29 ENCOUNTER — Other Ambulatory Visit: Payer: Self-pay | Admitting: Hematology and Oncology

## 2016-05-29 ENCOUNTER — Telehealth: Payer: Self-pay | Admitting: Hematology and Oncology

## 2016-05-29 NOTE — Telephone Encounter (Signed)
I received message from the ENT nurse Navigator informing me that his insurance company will not pay for his treatment at [facility] due to out of network. He desired to have his treatment transfer here. I have place order to see him back and to resume treatment on 06/06/2016.

## 2016-05-30 ENCOUNTER — Encounter: Payer: Self-pay | Admitting: Physical Therapy

## 2016-05-30 ENCOUNTER — Telehealth: Payer: Self-pay | Admitting: Hematology and Oncology

## 2016-05-30 NOTE — Telephone Encounter (Signed)
s.w. pt and advised on July appt....pt ok and aware °

## 2016-06-04 ENCOUNTER — Encounter: Payer: Self-pay | Admitting: Physical Therapy

## 2016-06-06 ENCOUNTER — Ambulatory Visit: Payer: Self-pay | Admitting: Hematology and Oncology

## 2016-06-06 ENCOUNTER — Encounter: Payer: Self-pay | Admitting: Physical Therapy

## 2016-06-06 ENCOUNTER — Other Ambulatory Visit: Payer: Self-pay

## 2016-06-06 ENCOUNTER — Other Ambulatory Visit: Payer: Self-pay | Admitting: Hematology and Oncology

## 2016-06-06 ENCOUNTER — Telehealth: Payer: Self-pay | Admitting: *Deleted

## 2016-06-06 ENCOUNTER — Telehealth: Payer: Self-pay | Admitting: Hematology and Oncology

## 2016-06-06 ENCOUNTER — Ambulatory Visit: Payer: Self-pay

## 2016-06-06 NOTE — Telephone Encounter (Signed)
  Oncology Nurse Navigator Documentation  Navigator Location: [facility] (06/06/16 1128) Navigator Encounter Type: Telephone (06/06/16 1128) Telephone: Shane Cantu Call;Appt Confirmation/Clarification (06/06/16 1128)                    In follow-up to my call this morning, called Shane Cantu, LVMM indicating today's appts have been rescheduled for next Friday beginning at 11:00 with flush and lab.    Gayleen Orem, RN, BSN, [place] at [place] [phone]                       Time Spent with Patient: 15 (06/06/16 1128)

## 2016-06-06 NOTE — Telephone Encounter (Signed)
  Oncology Nurse Navigator Documentation  Navigator Location: [facility] (06/06/16 0925) Navigator Encounter Type: Telephone (06/06/16 [id number]) Telephone: Jerilee Hoh Confirmation/Clarification (06/06/16 0925)                   Called Shane Cantu to further establish his recollection of date he received initial Taxotere infusion at [facility].  He was not sure if it was 6/15 or 6/22.  He voiced understanding that Care Everywhere indicated 6/22 and consequently his next treatment has been rescheduled from today until next week.  He understands I will call him with specific date and time.  Gayleen Orem, RN, BSN, [place] at [place] [phone]                         Time Spent with Patient: 15 (06/06/16 0925)

## 2016-06-06 NOTE — Telephone Encounter (Signed)
s.w. pt and advised on todays appt cx and moved to 7.14,,,,pt ok and aware

## 2016-06-13 ENCOUNTER — Ambulatory Visit (HOSPITAL_BASED_OUTPATIENT_CLINIC_OR_DEPARTMENT_OTHER): Payer: PPO | Admitting: Hematology and Oncology

## 2016-06-13 ENCOUNTER — Ambulatory Visit: Payer: PPO

## 2016-06-13 ENCOUNTER — Ambulatory Visit (HOSPITAL_BASED_OUTPATIENT_CLINIC_OR_DEPARTMENT_OTHER): Payer: PPO

## 2016-06-13 ENCOUNTER — Telehealth: Payer: Self-pay | Admitting: Hematology and Oncology

## 2016-06-13 ENCOUNTER — Other Ambulatory Visit (HOSPITAL_BASED_OUTPATIENT_CLINIC_OR_DEPARTMENT_OTHER): Payer: PPO

## 2016-06-13 VITALS — BP 136/68 | HR 60 | Temp 97.7°F | Resp 18 | Ht 72.0 in | Wt 212.5 lb

## 2016-06-13 DIAGNOSIS — C770 Secondary and unspecified malignant neoplasm of lymph nodes of head, face and neck: Secondary | ICD-10-CM | POA: Diagnosis not present

## 2016-06-13 DIAGNOSIS — Z79899 Other long term (current) drug therapy: Secondary | ICD-10-CM | POA: Diagnosis not present

## 2016-06-13 DIAGNOSIS — M542 Cervicalgia: Secondary | ICD-10-CM | POA: Diagnosis not present

## 2016-06-13 DIAGNOSIS — C01 Malignant neoplasm of base of tongue: Secondary | ICD-10-CM | POA: Diagnosis not present

## 2016-06-13 DIAGNOSIS — C029 Malignant neoplasm of tongue, unspecified: Secondary | ICD-10-CM

## 2016-06-13 DIAGNOSIS — G8929 Other chronic pain: Secondary | ICD-10-CM

## 2016-06-13 DIAGNOSIS — C7951 Secondary malignant neoplasm of bone: Secondary | ICD-10-CM

## 2016-06-13 DIAGNOSIS — Z5111 Encounter for antineoplastic chemotherapy: Secondary | ICD-10-CM

## 2016-06-13 DIAGNOSIS — D638 Anemia in other chronic diseases classified elsewhere: Secondary | ICD-10-CM | POA: Diagnosis not present

## 2016-06-13 DIAGNOSIS — Z5189 Encounter for other specified aftercare: Secondary | ICD-10-CM | POA: Diagnosis not present

## 2016-06-13 LAB — COMPREHENSIVE METABOLIC PANEL
ALT: 11 U/L (ref 0–55)
AST: 15 U/L (ref 5–34)
Albumin: 3.6 g/dL (ref 3.5–5.0)
Alkaline Phosphatase: 134 U/L (ref 40–150)
Anion Gap: 12 mEq/L — ABNORMAL HIGH (ref 3–11)
BUN: 24.2 mg/dL (ref 7.0–26.0)
CHLORIDE: 103 meq/L (ref 98–109)
CO2: 25 meq/L (ref 22–29)
CREATININE: 1.4 mg/dL — AB (ref 0.7–1.3)
Calcium: 9.4 mg/dL (ref 8.4–10.4)
EGFR: 52 mL/min/{1.73_m2} — AB (ref >90)
Glucose: 84 mg/dl (ref 70–140)
Potassium: 4.1 mEq/L (ref 3.5–5.1)
SODIUM: 139 meq/L (ref 136–145)
TOTAL PROTEIN: 7.1 g/dL (ref 6.4–8.3)
Total Bilirubin: 0.65 mg/dL (ref 0.20–1.20)

## 2016-06-13 LAB — CBC WITH DIFFERENTIAL/PLATELET
BASO%: 0.5 % (ref 0.0–2.0)
Basophils Absolute: 0 10*3/uL (ref 0.0–0.1)
EOS%: 1.8 % (ref 0.0–7.0)
Eosinophils Absolute: 0.1 10*3/uL (ref 0.0–0.5)
HCT: 36.6 % — ABNORMAL LOW (ref 38.4–49.9)
HGB: 11.8 g/dL — ABNORMAL LOW (ref 13.0–17.1)
LYMPH%: 8.2 % — AB (ref 14.0–49.0)
MCH: 26.9 pg — ABNORMAL LOW (ref 27.2–33.4)
MCHC: 32.2 g/dL (ref 32.0–36.0)
MCV: 83.5 fL (ref 79.3–98.0)
MONO#: 0.5 10*3/uL (ref 0.1–0.9)
MONO%: 9.4 % (ref 0.0–14.0)
NEUT%: 80.1 % — ABNORMAL HIGH (ref 39.0–75.0)
NEUTROS ABS: 4.4 10*3/uL (ref 1.5–6.5)
Platelets: 235 10*3/uL (ref 140–400)
RBC: 4.38 10*6/uL (ref 4.20–5.82)
RDW: 14.9 % — ABNORMAL HIGH (ref 11.0–14.6)
WBC: 5.5 10*3/uL (ref 4.0–10.3)
lymph#: 0.5 10*3/uL — ABNORMAL LOW (ref 0.9–3.3)

## 2016-06-13 LAB — TSH: TSH: 0.696 m(IU)/L (ref 0.320–4.118)

## 2016-06-13 MED ORDER — SODIUM CHLORIDE 0.9 % IV SOLN
75.0000 mg/m2 | Freq: Once | INTRAVENOUS | Status: AC
  Administered 2016-06-13: 170 mg via INTRAVENOUS
  Filled 2016-06-13: qty 17

## 2016-06-13 MED ORDER — SODIUM CHLORIDE 0.9 % IJ SOLN
10.0000 mL | INTRAMUSCULAR | Status: DC | PRN
  Filled 2016-06-13: qty 10

## 2016-06-13 MED ORDER — SODIUM CHLORIDE 0.9 % IV SOLN
Freq: Once | INTRAVENOUS | Status: AC
  Administered 2016-06-13: 14:00:00 via INTRAVENOUS

## 2016-06-13 MED ORDER — PEGFILGRASTIM 6 MG/0.6ML ~~LOC~~ PSKT
6.0000 mg | PREFILLED_SYRINGE | Freq: Once | SUBCUTANEOUS | Status: AC
  Administered 2016-06-13: 6 mg via SUBCUTANEOUS
  Filled 2016-06-13: qty 0.6

## 2016-06-13 MED ORDER — DEXAMETHASONE SODIUM PHOSPHATE 100 MG/10ML IJ SOLN
10.0000 mg | Freq: Once | INTRAMUSCULAR | Status: AC
  Administered 2016-06-13: 10 mg via INTRAVENOUS
  Filled 2016-06-13: qty 1

## 2016-06-13 NOTE — Progress Notes (Signed)
No flush today due to the fact that the patient does not have a PAC or a PICC line.  Pt sent to phlebotomist for lab draw.

## 2016-06-13 NOTE — Patient Instructions (Signed)
Docetaxel injection  What is this medicine?  DOCETAXEL (doe se TAX el) is a chemotherapy drug. It targets fast dividing cells, like cancer cells, and causes these cells to die. This medicine is used to treat many types of cancers like breast cancer, certain stomach cancers, head and neck cancer, lung cancer, and prostate cancer.  This medicine may be used for other purposes; ask your health care provider or pharmacist if you have questions.  What should I tell my health care provider before I take this medicine?  They need to know if you have any of these conditions:  -infection (especially a virus infection such as chickenpox, cold sores, or herpes)  -liver disease  -low blood counts, like low white cell, platelet, or red cell counts  -an unusual or allergic reaction to docetaxel, polysorbate 80, other chemotherapy agents, other medicines, foods, dyes, or preservatives  -pregnant or trying to get pregnant  -breast-feeding  How should I use this medicine?  This drug is given as an infusion into a vein. It is administered in a hospital or clinic by a specially trained health care professional.  Talk to your pediatrician regarding the use of this medicine in children. Special care may be needed.  Overdosage: If you think you have taken too much of this medicine contact a poison control center or emergency room at once.  NOTE: This medicine is only for you. Do not share this medicine with others.  What if I miss a dose?  It is important not to miss your dose. Call your doctor or health care professional if you are unable to keep an appointment.  What may interact with this medicine?  -cyclosporine  -erythromycin  -ketoconazole  -medicines to increase blood counts like filgrastim, pegfilgrastim, sargramostim  -vaccines  Talk to your doctor or health care professional before taking any of these medicines:  -acetaminophen  -aspirin  -ibuprofen  -ketoprofen  -naproxen  This list may not describe all possible interactions.  Give your health care provider a list of all the medicines, herbs, non-prescription drugs, or dietary supplements you use. Also tell them if you smoke, drink alcohol, or use illegal drugs. Some items may interact with your medicine.  What should I watch for while using this medicine?  Your condition will be monitored carefully while you are receiving this medicine. You will need important blood work done while you are taking this medicine.  This drug may make you feel generally unwell. This is not uncommon, as chemotherapy can affect healthy cells as well as cancer cells. Report any side effects. Continue your course of treatment even though you feel ill unless your doctor tells you to stop.  In some cases, you may be given additional medicines to help with side effects. Follow all directions for their use.  Call your doctor or health care professional for advice if you get a fever, chills or sore throat, or other symptoms of a cold or flu. Do not treat yourself. This drug decreases your body's ability to fight infections. Try to avoid being around people who are sick.  This medicine may increase your risk to bruise or bleed. Call your doctor or health care professional if you notice any unusual bleeding.  This medicine may contain alcohol in the product. You may get drowsy or dizzy. Do not drive, use machinery, or do anything that needs mental alertness until you know how this medicine affects you. Do not stand or sit up quickly, especially if you are an older   patient. This reduces the risk of dizzy or fainting spells. Avoid alcoholic drinks.  Do not become pregnant while taking this medicine. Women should inform their doctor if they wish to become pregnant or think they might be pregnant. There is a potential for serious side effects to an unborn child. Talk to your health care professional or pharmacist for more information. Do not breast-feed an infant while taking this medicine.  What side effects may I notice  from receiving this medicine?  Side effects that you should report to your doctor or health care professional as soon as possible:  -allergic reactions like skin rash, itching or hives, swelling of the face, lips, or tongue  -low blood counts - This drug may decrease the number of white blood cells, red blood cells and platelets. You may be at increased risk for infections and bleeding.  -signs of infection - fever or chills, cough, sore throat, pain or difficulty passing urine  -signs of decreased platelets or bleeding - bruising, pinpoint red spots on the skin, black, tarry stools, nosebleeds  -signs of decreased red blood cells - unusually weak or tired, fainting spells, lightheadedness  -breathing problems  -fast or irregular heartbeat  -low blood pressure  -mouth sores  -nausea and vomiting  -pain, swelling, redness or irritation at the injection site  -pain, tingling, numbness in the hands or feet  -swelling of the ankle, feet, hands  -weight gain  Side effects that usually do not require medical attention (report to your prescriber or health care professional if they continue or are bothersome):  -bone pain  -complete hair loss including hair on your head, underarms, pubic hair, eyebrows, and eyelashes  -diarrhea  -excessive tearing  -changes in the color of fingernails  -loosening of the fingernails  -nausea  -muscle pain  -red flush to skin  -sweating  -weak or tired  This list may not describe all possible side effects. Call your doctor for medical advice about side effects. You may report side effects to FDA at 1-800-FDA-1088.  Where should I keep my medicine?  This drug is given in a hospital or clinic and will not be stored at home.  NOTE: This sheet is a summary. It may not cover all possible information. If you have questions about this medicine, talk to your doctor, pharmacist, or health care provider.      2016, [employer]. (2014-12-04 16:04:57)

## 2016-06-13 NOTE — Telephone Encounter (Signed)
per pof to sch pt appt-gave pt copy of avs °

## 2016-06-14 ENCOUNTER — Encounter: Payer: Self-pay | Admitting: Hematology and Oncology

## 2016-06-14 NOTE — Assessment & Plan Note (Signed)
He has persistent disease in the left supraclavicular region, new recurrence of disease in the left neck and new disease in the occipital area that caused a lot of pain. He had received palliative radiation treatment to the occipital region Continue close monitoring

## 2016-06-14 NOTE — Assessment & Plan Note (Signed)
I recommend him to take methadone to 3 times a day along with Dilaudid. He felt that the pain is currently under good control

## 2016-06-14 NOTE — Assessment & Plan Note (Addendum)
We discussed the role of chemotherapy. The intent is for palliative.  We discussed some of the risks, benefits, side-effects of Docetaxel.   Some of the short term side-effects included, though not limited to, risk of severe allergic reaction, fatigue, weight loss, pancytopenia, life-threatening infections, need for transfusions of blood products, nausea, vomiting, change in bowel habits, loss of hair, admission to hospital for various reasons, and risks of death.   Long term side-effects are also discussed including risks of infertility, permanent damage to nerve function, chronic fatigue, and rare secondary malignancy including bone marrow disorders.   The patient is aware that the response rates discussed earlier is not guaranteed.    After a long discussion, patient made an informed decision to proceed with the prescribed plan of care I will see him again in 3 weeks prior to 3rd week of Rx. I plan to repeat imaging studies after 3 cycles of Rx Due to prior poor tolerance to chemo with sepsis complication, I will cover him with Neulasta after each cycle of Rx

## 2016-06-14 NOTE — Progress Notes (Signed)
[place] OFFICE PROGRESS NOTE  Patient Care Team: [name], MD as PCP - General (Family Medicine) Leota Sauers, RN as Oncology Nurse Navigator [name], MD as Consulting Physician (Hematology and Oncology) Eppie Gibson, MD as Attending Physician (Radiation Oncology) Karie Mainland, RD as Dietitian (Nutrition)  SUMMARY OF ONCOLOGIC HISTORY:   Tongue cancer ([place])   04/10/2015 Imaging CT Neck with contrast:  L base of tongue SCC with regional adenopathy; suspected metastatic disease to C2, T2.   04/13/2015 Initial Biopsy Accession [id number]:  Lymph node, needle/core biopsy - SCC, p16 positive.   04/18/2015 Initial Diagnosis Tongue cancer   04/24/2015 Imaging PET CT showed tongue cancer, lung nodule and possible bone mets   04/26/2015 Surgery He had dental extractions   05/03/2015 Imaging CT neck with contrast: L base of tongue with regional adenopathy, metastatic disease C2, C3, T2 vertebra; posterior L fourth rib.   05/09/2015 Procedure Port-a-cath placed.   05/11/2015 - 05/23/2015 Radiation Therapy Palliative radiation therapy:  1) T1-T3 and Left 3rd posterior rib / 30 Gy in 10 fractions, 2) Base of tongue, neck, and Cspine / 30 Gy in 10 fractions.   06/01/2015 - 07/17/2015 Chemotherapy He received 2 cycles of carboplatin, 5-FU and cetuximab   06/04/2015 - 06/05/2015 Hospital Admission He was admitted to the hospital after syncopal episode and had right nondisplaced fibular fracture   07/20/2015 - 07/24/2015 Hospital Admission he was admitted to the hospital for weakness and uncontrolled nausea and dehydration   08/02/2015 Imaging PET CT scan showed positive response to treatment   08/04/2015 - 08/12/2015 Hospital Admission He was admitted to the hospital with sepsis, MRSA bacteremia and respiratory failure   08/07/2015 Surgery PAC removed r/t sepsis, MRSA  bacteremia.   10/15/2015 Imaging Ct scan of the neck, chest, abdomen and pelvis showed stable sclerotic lesions. No new disease  progression   01/14/2016 Imaging PET scan showed new hypermetabolic left supraclavicular nodes. Progression of osseous metastasis.    01/22/2016 - 03/04/2016 Chemotherapy He received Keytruda   03/24/2016 PET scan PET scan showed mixed response but overall progressive disease   04/14/2016 - 04/25/2016 Radiation Therapy He received XRT to right anterior 5th rib treated to 30 Gy in 10 fractions & Left posterior neck treated to 30 Gy in 10 fractions    05/22/2016 -  Chemotherapy He was started on Taxotere at [facility]. Starting cycle 2, he transferred care back to [place]: Please see below for problem oriented charting. He is seen prior to cycle 2 of chemo. Patient requested transfer of care back to [facility] due to insurance not covering his Rx at [facility] His neck pain is under good control. He has persistent lymphadenopathy on his left supraclavicular region No other new bone pain or symptoms He denies neuropathy with chemo  REVIEW OF SYSTEMS:   Constitutional: Denies fevers, chills or abnormal weight loss Eyes: Denies blurriness of vision Ears, nose, mouth, throat, and face: Denies mucositis or sore throat Respiratory: Denies cough, dyspnea or wheezes Cardiovascular: Denies palpitation, chest discomfort or lower extremity swelling Gastrointestinal:  Denies nausea, heartburn or change in bowel habits Skin: Denies abnormal skin rashes Lymphatics: Denies new lymphadenopathy or easy bruising Neurological:Denies numbness, tingling or new weaknesses Behavioral/Psych: Mood is stable, no new changes  All other systems were reviewed with the patient and are negative.  I have reviewed the past medical history, past surgical history, social history and family history with the patient and they are unchanged  from previous note.  ALLERGIES:  is allergic to morphine and related.  MEDICATIONS:  Current Outpatient Prescriptions  Medication Sig Dispense Refill  . amLODipine (NORVASC) 10 MG tablet  Take 1 tablet (10 mg total) by mouth daily. 60 tablet 1  . Armodafinil 250 MG tablet Take 250 mg by mouth daily. Reported on 04/21/2016    . aspirin EC 81 MG tablet Take 81 mg by mouth daily.    . celecoxib (CELEBREX) 200 MG capsule Take 200 mg by mouth daily.     . fluticasone (FLONASE) 50 MCG/ACT nasal spray Place 2 sprays into both nostrils daily. (Patient taking differently: Place 2 sprays into both nostrils 2 (two) times daily as needed for allergies. ) 16 g 0  . HYDROmorphone (DILAUDID) 4 MG tablet Take 1 tablet (4 mg total) by mouth every 4 (four) hours as needed for severe pain. 90 tablet 0  . ketorolac (TORADOL) 10 MG tablet Take 1 tablet (10 mg total) by mouth every 6 (six) hours as needed. (Patient not taking: Reported on 04/14/2016) 20 tablet 0  . lansoprazole (PREVACID) 30 MG capsule Take 30 mg by mouth daily at 12 noon.    . Magnesium Oxide 400 MG CAPS Take 1 capsule (400 mg total) by mouth daily. (Patient taking differently: Take 400 mg by mouth daily as needed (arthritis pain). ) 30 capsule 0  . methadone (DOLOPHINE) 10 MG tablet Take 1 tablet (10 mg total) by mouth every 8 (eight) hours. 90 tablet 0  . polyethylene glycol powder (GLYCOLAX/MIRALAX) powder Take 17 g by mouth 2 (two) times daily. (Patient taking differently: Take 17 g by mouth 2 (two) times daily as needed for mild constipation. ) 255 g 0   No current facility-administered medications for this visit.    PHYSICAL EXAMINATION: ECOG PERFORMANCE STATUS: 1 - Symptomatic but completely ambulatory  Filed Vitals:   06/13/16 1201  BP: 136/68  Pulse: 60  Temp: 97.7 F (36.5 C)  Resp: 18   Filed Weights   06/13/16 1201  Weight: 212 lb 8 oz (96.389 kg)    GENERAL:alert, no distress and comfortable SKIN: skin color, texture, turgor are normal, no rashes or significant lesions EYES: normal, Conjunctiva are pink and non-injected, sclera clear OROPHARYNX:no exudate, no erythema and lips, buccal mucosa, and tongue normal   NECK: supple, thyroid normal size, non-tender, without nodularity LYMPH:  palpable lymphadenopathy in the left neck region LUNGS: clear to auscultation and percussion with normal breathing effort HEART: regular rate & rhythm and no murmurs and no lower extremity edema ABDOMEN:abdomen soft, non-tender and normal bowel sounds Musculoskeletal:no cyanosis of digits and no clubbing  NEURO: alert & oriented x 3 with fluent speech, no focal motor/sensory deficits  LABORATORY DATA:  I have reviewed the data as listed    Component Value Date/Time   NA 139 06/13/2016 1225   NA 137 04/14/2016 0022   K 4.1 06/13/2016 1225   K 4.2 04/14/2016 0022   CL 105 04/14/2016 0022   CO2 25 06/13/2016 1225   CO2 24 04/14/2016 0022   GLUCOSE 84 06/13/2016 1225   GLUCOSE 142* 04/14/2016 0022   BUN 24.2 06/13/2016 1225   BUN 27* 04/14/2016 0022   CREATININE 1.4* 06/13/2016 1225   CREATININE 1.35* 04/14/2016 0022   CREATININE 1.22 10/01/2015 0957   CALCIUM 9.4 06/13/2016 1225   CALCIUM 9.3 04/14/2016 0022   PROT 7.1 06/13/2016 1225   PROT 7.0 08/12/2015 0244   ALBUMIN 3.6 06/13/2016 1225   ALBUMIN 2.9*  08/12/2015 0244   AST 15 06/13/2016 1225   AST 20 08/12/2015 0244   ALT 11 06/13/2016 1225   ALT 20 08/12/2015 0244   ALKPHOS 134 06/13/2016 1225   ALKPHOS 83 08/12/2015 0244   BILITOT 0.65 06/13/2016 1225   BILITOT 0.8 08/12/2015 0244   GFRNONAA 55* 04/14/2016 0022   GFRAA >60 04/14/2016 0022    No results found for: SPEP, UPEP  Lab Results  Component Value Date   WBC 5.5 06/13/2016   NEUTROABS 4.4 06/13/2016   HGB 11.8* 06/13/2016   HCT 36.6* 06/13/2016   MCV 83.5 06/13/2016   PLT 235 06/13/2016      Chemistry      Component Value Date/Time   NA 139 06/13/2016 1225   NA 137 04/14/2016 0022   K 4.1 06/13/2016 1225   K 4.2 04/14/2016 0022   CL 105 04/14/2016 0022   CO2 25 06/13/2016 1225   CO2 24 04/14/2016 0022   BUN 24.2 06/13/2016 1225   BUN 27* 04/14/2016 0022   CREATININE  1.4* 06/13/2016 1225   CREATININE 1.35* 04/14/2016 0022   CREATININE 1.22 10/01/2015 0957      Component Value Date/Time   CALCIUM 9.4 06/13/2016 1225   CALCIUM 9.3 04/14/2016 0022   ALKPHOS 134 06/13/2016 1225   ALKPHOS 83 08/12/2015 0244   AST 15 06/13/2016 1225   AST 20 08/12/2015 0244   ALT 11 06/13/2016 1225   ALT 20 08/12/2015 0244   BILITOT 0.65 06/13/2016 1225   BILITOT 0.8 08/12/2015 0244       RADIOGRAPHIC STUDIES: I have personally reviewed the radiological images as listed and agreed with the findings in the report. No results found.   ASSESSMENT & PLAN:  Tongue cancer We discussed the role of chemotherapy. The intent is for palliative.  We discussed some of the risks, benefits, side-effects of Docetaxel.   Some of the short term side-effects included, though not limited to, risk of severe allergic reaction, fatigue, weight loss, pancytopenia, life-threatening infections, need for transfusions of blood products, nausea, vomiting, change in bowel habits, loss of hair, admission to hospital for various reasons, and risks of death.   Long term side-effects are also discussed including risks of infertility, permanent damage to nerve function, chronic fatigue, and rare secondary malignancy including bone marrow disorders.   The patient is aware that the response rates discussed earlier is not guaranteed.    After a long discussion, patient made an informed decision to proceed with the prescribed plan of care I will see him again in 3 weeks prior to 3rd week of Rx. 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 He had received  palliative radiation treatment to the occipital region Continue close monitoring   Anemia due to chronic illness This is likely due to recent treatment. The patient denies recent history of bleeding such as epistaxis, hematuria or hematochezia. He is asymptomatic from the anemia. I will observe for now.  He does not require transfusion now. I will continue the chemotherapy at current dose without dosage adjustment.  If the anemia gets progressive worse in the future, I might have to delay  his treatment or adjust the chemotherapy dose.   Chronic neck pain I recommend him to take methadone to 3 times a day along with Dilaudid. He felt that the pain is currently under good control   Orders Placed This Encounter  Procedures  . Comprehensive metabolic panel    Standing Status: Standing     Number of Occurrences: 22     Standing Expiration Date: 06/13/2017  . TSH    Standing Status: Future     Number of Occurrences: 1     Standing Expiration Date: 07/18/2017   All questions were answered. The patient knows to call the clinic with any problems, questions or concerns. No barriers to learning was detected. I spent 20 minutes counseling the patient face to face. The total time spent in the appointment was 30 minutes and more than 50% was on counseling and review of test results     [facility], [place], MD 06/14/2016 2:49 PM

## 2016-06-14 NOTE — Assessment & Plan Note (Signed)
This is likely due to recent treatment. The patient denies recent history of bleeding such as epistaxis, hematuria or hematochezia. He is asymptomatic from the anemia. I will observe for now.  He does not require transfusion now. I will continue the chemotherapy at current dose without dosage adjustment.  If the anemia gets progressive worse in the future, I might have to delay his treatment or adjust the chemotherapy dose.  

## 2016-06-23 ENCOUNTER — Encounter (HOSPITAL_COMMUNITY): Payer: Self-pay | Admitting: Dentistry

## 2016-06-27 ENCOUNTER — Ambulatory Visit: Payer: Self-pay

## 2016-07-04 ENCOUNTER — Ambulatory Visit (HOSPITAL_BASED_OUTPATIENT_CLINIC_OR_DEPARTMENT_OTHER): Payer: PPO | Admitting: Hematology and Oncology

## 2016-07-04 ENCOUNTER — Other Ambulatory Visit (HOSPITAL_BASED_OUTPATIENT_CLINIC_OR_DEPARTMENT_OTHER): Payer: PPO

## 2016-07-04 ENCOUNTER — Ambulatory Visit (HOSPITAL_BASED_OUTPATIENT_CLINIC_OR_DEPARTMENT_OTHER): Payer: PPO

## 2016-07-04 ENCOUNTER — Telehealth: Payer: Self-pay | Admitting: *Deleted

## 2016-07-04 ENCOUNTER — Telehealth: Payer: Self-pay | Admitting: Hematology and Oncology

## 2016-07-04 ENCOUNTER — Encounter: Payer: Self-pay | Admitting: Hematology and Oncology

## 2016-07-04 VITALS — BP 118/73 | HR 79 | Temp 98.2°F | Resp 17 | Wt 216.1 lb

## 2016-07-04 DIAGNOSIS — C770 Secondary and unspecified malignant neoplasm of lymph nodes of head, face and neck: Secondary | ICD-10-CM

## 2016-07-04 DIAGNOSIS — M542 Cervicalgia: Secondary | ICD-10-CM | POA: Diagnosis not present

## 2016-07-04 DIAGNOSIS — Z5111 Encounter for antineoplastic chemotherapy: Secondary | ICD-10-CM | POA: Diagnosis not present

## 2016-07-04 DIAGNOSIS — C7951 Secondary malignant neoplasm of bone: Secondary | ICD-10-CM

## 2016-07-04 DIAGNOSIS — R2243 Localized swelling, mass and lump, lower limb, bilateral: Secondary | ICD-10-CM

## 2016-07-04 DIAGNOSIS — I890 Lymphedema, not elsewhere classified: Secondary | ICD-10-CM | POA: Insufficient documentation

## 2016-07-04 DIAGNOSIS — C01 Malignant neoplasm of base of tongue: Secondary | ICD-10-CM | POA: Diagnosis not present

## 2016-07-04 DIAGNOSIS — C029 Malignant neoplasm of tongue, unspecified: Secondary | ICD-10-CM

## 2016-07-04 DIAGNOSIS — G8929 Other chronic pain: Secondary | ICD-10-CM

## 2016-07-04 DIAGNOSIS — Z5189 Encounter for other specified aftercare: Secondary | ICD-10-CM | POA: Diagnosis not present

## 2016-07-04 DIAGNOSIS — D638 Anemia in other chronic diseases classified elsewhere: Secondary | ICD-10-CM

## 2016-07-04 LAB — COMPREHENSIVE METABOLIC PANEL
ALT: 10 U/L (ref 0–55)
AST: 15 U/L (ref 5–34)
Albumin: 3.4 g/dL — ABNORMAL LOW (ref 3.5–5.0)
Alkaline Phosphatase: 118 U/L (ref 40–150)
Anion Gap: 12 mEq/L — ABNORMAL HIGH (ref 3–11)
BUN: 26.5 mg/dL — AB (ref 7.0–26.0)
CALCIUM: 9.4 mg/dL (ref 8.4–10.4)
CHLORIDE: 105 meq/L (ref 98–109)
CO2: 23 mEq/L (ref 22–29)
Creatinine: 1.5 mg/dL — ABNORMAL HIGH (ref 0.7–1.3)
EGFR: 48 mL/min/{1.73_m2} — AB (ref >90)
GLUCOSE: 99 mg/dL (ref 70–140)
POTASSIUM: 4.1 meq/L (ref 3.5–5.1)
SODIUM: 139 meq/L (ref 136–145)
Total Bilirubin: 0.6 mg/dL (ref 0.20–1.20)
Total Protein: 6.6 g/dL (ref 6.4–8.3)

## 2016-07-04 LAB — CBC WITH DIFFERENTIAL/PLATELET
BASO%: 0.3 % (ref 0.0–2.0)
BASOS ABS: 0 10*3/uL (ref 0.0–0.1)
EOS%: 0.8 % (ref 0.0–7.0)
Eosinophils Absolute: 0.1 10*3/uL (ref 0.0–0.5)
HCT: 36.7 % — ABNORMAL LOW (ref 38.4–49.9)
HGB: 11.9 g/dL — ABNORMAL LOW (ref 13.0–17.1)
LYMPH%: 11.7 % — ABNORMAL LOW (ref 14.0–49.0)
MCH: 27 pg — AB (ref 27.2–33.4)
MCHC: 32.4 g/dL (ref 32.0–36.0)
MCV: 83.4 fL (ref 79.3–98.0)
MONO#: 0.4 10*3/uL (ref 0.1–0.9)
MONO%: 5.6 % (ref 0.0–14.0)
NEUT#: 5.8 10*3/uL (ref 1.5–6.5)
NEUT%: 81.6 % — AB (ref 39.0–75.0)
Platelets: 198 10*3/uL (ref 140–400)
RBC: 4.4 10*6/uL (ref 4.20–5.82)
RDW: 15.2 % — ABNORMAL HIGH (ref 11.0–14.6)
WBC: 7.1 10*3/uL (ref 4.0–10.3)
lymph#: 0.8 10*3/uL — ABNORMAL LOW (ref 0.9–3.3)

## 2016-07-04 MED ORDER — HYDROMORPHONE HCL 4 MG PO TABS: 4.0000 mg | ORAL_TABLET | ORAL | 0 refills | Status: DC | PRN

## 2016-07-04 MED ORDER — SODIUM CHLORIDE 0.9 % IV SOLN
75.0000 mg/m2 | Freq: Once | INTRAVENOUS | Status: AC
  Administered 2016-07-04: 170 mg via INTRAVENOUS
  Filled 2016-07-04: qty 17

## 2016-07-04 MED ORDER — SODIUM CHLORIDE 0.9 % IV SOLN
10.0000 mg | Freq: Once | INTRAVENOUS | Status: AC
  Administered 2016-07-04: 10 mg via INTRAVENOUS
  Filled 2016-07-04: qty 1

## 2016-07-04 MED ORDER — SODIUM CHLORIDE 0.9 % IV SOLN
Freq: Once | INTRAVENOUS | Status: AC
  Administered 2016-07-04: 13:00:00 via INTRAVENOUS

## 2016-07-04 MED ORDER — PEGFILGRASTIM 6 MG/0.6ML ~~LOC~~ PSKT
6.0000 mg | PREFILLED_SYRINGE | Freq: Once | SUBCUTANEOUS | Status: AC
  Administered 2016-07-04: 6 mg via SUBCUTANEOUS
  Filled 2016-07-04: qty 0.6

## 2016-07-04 NOTE — Telephone Encounter (Signed)
Gave pt cal & avs °

## 2016-07-04 NOTE — Progress Notes (Signed)
[facility] OFFICE PROGRESS NOTE  Patient Care Team: [name], MD as PCP - General (Family Medicine) Leota Sauers, RN as Oncology Nurse Navigator [name], MD as Consulting Physician (Hematology and Oncology) Eppie Gibson, MD as Attending Physician (Radiation Oncology) Karie Mainland, RD as Dietitian (Nutrition)  SUMMARY OF ONCOLOGIC HISTORY:   Tongue cancer ([place])   04/10/2015 Imaging    CT Neck with contrast:  L base of tongue SCC with regional adenopathy; suspected metastatic disease to C2, T2.     04/13/2015 Initial Biopsy    Accession [id number]:  Lymph node, needle/core biopsy - SCC, p16 positive.     04/18/2015 Initial Diagnosis    Tongue cancer     04/24/2015 Imaging    PET CT showed tongue cancer, lung nodule and possible bone mets     04/26/2015 Surgery    He had dental extractions     05/03/2015 Imaging    CT neck with contrast: L base of tongue with regional adenopathy, metastatic disease C2, C3, T2 vertebra; posterior L fourth rib.     05/09/2015 Procedure    Port-a-cath placed.     05/11/2015 - 05/23/2015 Radiation Therapy    Palliative radiation therapy:  1) T1-T3 and Left 3rd posterior rib / 30 Gy in 10 fractions, 2) Base of tongue, neck, and Cspine / 30 Gy in 10 fractions.     06/01/2015 - 07/17/2015 Chemotherapy    He received 2 cycles of carboplatin, 5-FU and cetuximab     06/04/2015 - 06/05/2015 Hospital Admission    He was admitted to the hospital after syncopal episode and had right nondisplaced fibular fracture     07/20/2015 - 07/24/2015 Hospital Admission    he was admitted to the hospital for weakness and uncontrolled nausea and dehydration     08/02/2015 Imaging    PET CT scan showed positive response to treatment     08/04/2015 - 08/12/2015 Hospital Admission    He was admitted to the hospital with sepsis, MRSA bacteremia and respiratory failure     08/07/2015 Surgery    PAC removed r/t sepsis, MRSA  bacteremia.     10/15/2015 Imaging     Ct scan of the neck, chest, abdomen and pelvis showed stable sclerotic lesions. No new disease progression     01/14/2016 Imaging    PET scan showed new hypermetabolic left supraclavicular nodes. Progression of osseous metastasis.      01/22/2016 - 03/04/2016 Chemotherapy    He received Keytruda     03/24/2016 PET scan    PET scan showed mixed response but overall progressive disease     04/14/2016 - 04/25/2016 Radiation Therapy    He received XRT to right anterior 5th rib treated to 30 Gy in 10 fractions & Left posterior neck treated to 30 Gy in 10 fractions      05/22/2016 -  Chemotherapy    He was started on Taxotere at [facility]. Starting cycle 2, he transferred care back to [place]: Please see below for problem oriented charting. He is seen prior to cycle 3 of chemotherapy. He had mild nausea but no vomiting. He continues to have significant neck pain but well controlled with current prescription pain medicine. He have diffuse bilateral lower extremity edema but admits he is not taking dexamethasone consistently. He denies recent infection. No peripheral neuropathy.  REVIEW OF SYSTEMS:   Constitutional: Denies fevers, chills or abnormal weight loss Eyes: Denies  blurriness of vision Ears, nose, mouth, throat, and face: Denies mucositis or sore throat Respiratory: Denies cough, dyspnea or wheezes Cardiovascular: Denies palpitation, chest discomfort  Skin: Denies abnormal skin rashes Lymphatics: Denies new lymphadenopathy or easy bruising Neurological:Denies numbness, tingling or new weaknesses Behavioral/Psych: Mood is stable, no new changes  All other systems were reviewed with the patient and are negative.  I have reviewed the past medical history, past surgical history, social history and family history with the patient and they are unchanged from previous note.  ALLERGIES:  is allergic to morphine and related.  MEDICATIONS:  Current Outpatient  Prescriptions  Medication Sig Dispense Refill  . amLODipine (NORVASC) 10 MG tablet Take 1 tablet (10 mg total) by mouth daily. 60 tablet 1  . aspirin EC 81 MG tablet Take 81 mg by mouth daily.    . celecoxib (CELEBREX) 200 MG capsule Take 200 mg by mouth daily.     Marland Kitchen dexamethasone (DECADRON) 4 MG tablet Take '8mg'$  (2 x '4mg'$  tablets) by mouth twice daily for 1 day prior to (none the day of) and 1 day following chemotherapy. Repeat every 21 Days.    . fluticasone (FLONASE) 50 MCG/ACT nasal spray Place 2 sprays into both nostrils daily. (Patient taking differently: Place 2 sprays into both nostrils 2 (two) times daily as needed for allergies. ) 16 g 0  . HYDROmorphone (DILAUDID) 4 MG tablet Take 1 tablet (4 mg total) by mouth every 4 (four) hours as needed for severe pain. 90 tablet 0  . ibuprofen (ADVIL,MOTRIN) 200 MG tablet Take by mouth.    . lansoprazole (PREVACID) 30 MG capsule Take 30 mg by mouth daily at 12 noon.    . Magnesium Oxide 400 MG CAPS Take 1 capsule (400 mg total) by mouth daily. (Patient taking differently: Take 400 mg by mouth daily as needed (arthritis pain). ) 30 capsule 0  . methadone (DOLOPHINE) 10 MG tablet Take 1 tablet (10 mg total) by mouth every 8 (eight) hours. 90 tablet 0  . polyethylene glycol powder (GLYCOLAX/MIRALAX) powder Take 17 g by mouth 2 (two) times daily. (Patient taking differently: Take 17 g by mouth 2 (two) times daily as needed for mild constipation. ) 255 g 0  . prochlorperazine (COMPAZINE) 10 MG tablet Take by mouth.     No current facility-administered medications for this visit.    Facility-Administered Medications Ordered in Other Visits  Medication Dose Route Frequency Provider Last Rate Last Dose  . DOCEtaxel (TAXOTERE) 170 mg in sodium chloride 0.9 % 250 mL chemo infusion  75 mg/m2 (Treatment Plan Recorded) Intravenous Once [name], MD 267 mL/hr at 07/04/16 1353 170 mg at 07/04/16 1353  . pegfilgrastim (NEULASTA ONPRO KIT) injection 6 mg  6 mg  Subcutaneous Once [name], MD        PHYSICAL EXAMINATION: ECOG PERFORMANCE STATUS: 1 - Symptomatic but completely ambulatory  Vitals:   07/04/16 1054  BP: 118/73  Pulse: 79  Resp: 17  Temp: 98.2 F (36.8 C)   Filed Weights   07/04/16 1054  Weight: 216 lb 1.6 oz (98 kg)    GENERAL:alert, no distress and comfortable SKIN: skin color, texture, turgor are normal, no rashes or significant lesions EYES: normal, Conjunctiva are pink and non-injected, sclera clear OROPHARYNX:no exudate, no erythema and lips, buccal mucosa, and tongue normal  NECK: supple, thyroid normal size, non-tender, without nodularity. Mild tenderness on palpation on the left submandibular region. He has significant lymphedema around his neck LYMPH:  no palpable  lymphadenopathy in the cervical, axillary or inguinal LUNGS: clear to auscultation and percussion with normal breathing effort HEART: regular rate & rhythm and no murmurs with bilateral lower extremity edema ABDOMEN:abdomen soft, non-tender and normal bowel sounds Musculoskeletal:no cyanosis of digits and no clubbing  NEURO: alert & oriented x 3 with fluent speech, no focal motor/sensory deficits  LABORATORY DATA:  I have reviewed the data as listed    Component Value Date/Time   NA 139 07/04/2016 1042   K 4.1 07/04/2016 1042   CL 105 04/14/2016 0022   CO2 23 07/04/2016 1042   GLUCOSE 99 07/04/2016 1042   BUN 26.5 (H) 07/04/2016 1042   CREATININE 1.5 (H) 07/04/2016 1042   CALCIUM 9.4 07/04/2016 1042   PROT 6.6 07/04/2016 1042   ALBUMIN 3.4 (L) 07/04/2016 1042   AST 15 07/04/2016 1042   ALT 10 07/04/2016 1042   ALKPHOS 118 07/04/2016 1042   BILITOT 0.60 07/04/2016 1042   GFRNONAA 55 (L) 04/14/2016 0022   GFRAA >60 04/14/2016 0022    No results found for: SPEP, UPEP  Lab Results  Component Value Date   WBC 7.1 07/04/2016   NEUTROABS 5.8 07/04/2016   HGB 11.9 (L) 07/04/2016   HCT 36.7 (L) 07/04/2016   MCV 83.4 07/04/2016   PLT 198  07/04/2016      Chemistry      Component Value Date/Time   NA 139 07/04/2016 1042   K 4.1 07/04/2016 1042   CL 105 04/14/2016 0022   CO2 23 07/04/2016 1042   BUN 26.5 (H) 07/04/2016 1042   CREATININE 1.5 (H) 07/04/2016 1042      Component Value Date/Time   CALCIUM 9.4 07/04/2016 1042   ALKPHOS 118 07/04/2016 1042   AST 15 07/04/2016 1042   ALT 10 07/04/2016 1042   BILITOT 0.60 07/04/2016 1042     ASSESSMENT & PLAN:  Tongue cancer He tolerated chemotherapy well. He had some mild swelling but is not taking dexamethasone consistently. 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 He had received palliative radiation treatment to the occipital region Continue close monitoring   Chronic neck pain I recommend him to take methadone to 3 times a day along with Dilaudid. He felt that the pain is currently under good control I refill prescription Dilaudid today  Anemia due to chronic illness This is likely due to recent treatment. The patient denies recent history of bleeding such as epistaxis, hematuria or hematochezia. He is asymptomatic from the anemia. I will observe for now.  He does not require transfusion now. I will continue the chemotherapy at current dose without dosage adjustment.  If the anemia gets progressive worse in the future, I might have to delay his treatment or adjust the chemotherapy dose.   Localized swelling of both lower legs He has bilateral lower extremity edema consistent with side effects from [place] to. I reinforced importance of taking dexamethasone  consistently.  Lymphedema of face He has significant lymphedema on his face and neck from prior radiation treatment. He would benefit from outpatient physical evaluation. I will get the help from my Navigator  to schedule MDC follow-up at the head and neck clinic   Orders Placed This Encounter  Procedures  . NM PET Image Restag (PS) Skull Base To Thigh    Standing Status:   Future    Standing Expiration Date:   08/08/2017    Order Specific Question:   Reason for exam:    Answer:   staging metastatic tongue ca, assess response to Rx    Order Specific Question:   Preferred imaging location?    Answer:   [facility]   All questions were answered. The patient knows to call the clinic with any problems, questions or concerns. No barriers to learning was detected. I spent 25 minutes counseling the patient face to face. The total time spent in the appointment was 40 minutes and more than 50% was on counseling and review of test results     [facility], Krystian Younglove, MD 07/04/2016 1:56 PM

## 2016-07-04 NOTE — Patient Instructions (Signed)
Cancer Center Discharge Instructions for Patients Receiving Chemotherapy  Today you received the following chemotherapy agents:  Taxotere.  To help prevent nausea and vomiting after your treatment, we encourage you to take your nausea medication as directed.   If you develop nausea and vomiting that is not controlled by your nausea medication, call the clinic.   BELOW ARE SYMPTOMS THAT SHOULD BE REPORTED IMMEDIATELY:  *FEVER GREATER THAN 100.5 F  *CHILLS WITH OR WITHOUT FEVER  NAUSEA AND VOMITING THAT IS NOT CONTROLLED WITH YOUR NAUSEA MEDICATION  *UNUSUAL SHORTNESS OF BREATH  *UNUSUAL BRUISING OR BLEEDING  TENDERNESS IN MOUTH AND THROAT WITH OR WITHOUT PRESENCE OF ULCERS  *URINARY PROBLEMS  *BOWEL PROBLEMS  UNUSUAL RASH Items with * indicate a potential emergency and should be followed up as soon as possible.  Feel free to call the clinic you have any questions or concerns. The clinic phone number is [phone].  Please show the CHEMO ALERT CARD at check-in to the Emergency Department and triage nurse.  

## 2016-07-04 NOTE — Assessment & Plan Note (Signed)
He tolerated chemotherapy well. He had some mild swelling but is not taking dexamethasone consistently. I reinforced the importance of taking dexamethasone I plan to repeat imaging studies after 3 cycles of Rx Due to prior poor tolerance to chemo with sepsis complication, I will cover him with Neulasta after each cycle of Rx

## 2016-07-04 NOTE — Assessment & Plan Note (Signed)
He has significant lymphedema on his face and neck from prior radiation treatment. He would benefit from outpatient physical evaluation. I will get the help from my Navigator to schedule MDC follow-up at the head and neck clinic

## 2016-07-04 NOTE — Telephone Encounter (Signed)
Per staff message and POF I have scheduled appts. Advised scheduler of appts. JMW  

## 2016-07-04 NOTE — Assessment & Plan Note (Signed)
I recommend him to take methadone to 3 times a day along with Dilaudid. He felt that the pain is currently under good control I refill prescription Dilaudid today

## 2016-07-04 NOTE — Assessment & Plan Note (Signed)
This is likely due to recent treatment. The patient denies recent history of bleeding such as epistaxis, hematuria or hematochezia. He is asymptomatic from the anemia. I will observe for now.  He does not require transfusion now. I will continue the chemotherapy at current dose without dosage adjustment.  If the anemia gets progressive worse in the future, I might have to delay his treatment or adjust the chemotherapy dose.  

## 2016-07-04 NOTE — Assessment & Plan Note (Signed)
He has persistent disease in the left supraclavicular region, new recurrence of disease in the left neck and new disease in the occipital area that caused a lot of pain. He had received palliative radiation treatment to the occipital region Continue close monitoring

## 2016-07-04 NOTE — Assessment & Plan Note (Signed)
He has bilateral lower extremity edema consistent with side effects from [place] to. I reinforced importance of taking dexamethasone consistently.

## 2016-07-17 ENCOUNTER — Telehealth: Payer: Self-pay | Admitting: *Deleted

## 2016-07-17 NOTE — Telephone Encounter (Signed)
  Oncology Nurse Navigator Documentation  Called Mr. Iovino to see if interested in attending next week's [facility] to see PT, Nutrition and SW.  He declined, "I'm not feeling too good, don't want to go out, drive much.  I'm tired."  He indicated he did see PT [facility] in mid June.  I confirmed his understanding of appts for next week including 07/23/999 PET with 0930 arrival, 8/24 appts starting with 1015 Lab.    Gayleen Orem, RN, BSN, [place] at [facility] [phone]  Navigator Location: [place] (07/17/16 0946) Navigator Encounter Type: Telephone (07/17/16 0946) Telephone: Outgoing Call (07/17/16 0946)                 Interventions: Coordination of Care (07/17/16 0946)                      Time Spent with Patient: 15 (07/17/16 0946)

## 2016-07-21 ENCOUNTER — Telehealth: Payer: Self-pay | Admitting: Hematology and Oncology

## 2016-07-21 NOTE — Telephone Encounter (Signed)
ADDED LAB/FLUSH 8/24 PER 8/17 SCHEDULE MESSAGE. LAB/FLUSH 8/24 ALREADY CXD. SPOKE WITH PATIENT AND CONFIRMED APPOINTMENTS FOR 8/23 AND 8/24.

## 2016-07-23 ENCOUNTER — Other Ambulatory Visit (HOSPITAL_BASED_OUTPATIENT_CLINIC_OR_DEPARTMENT_OTHER): Payer: PPO

## 2016-07-23 ENCOUNTER — Ambulatory Visit (HOSPITAL_COMMUNITY)
Admission: RE | Admit: 2016-07-23 | Discharge: 2016-07-23 | Disposition: A | Discharge status: Home / Self Care | Payer: PPO | Source: Ambulatory Visit | Attending: Hematology and Oncology | Admitting: Hematology and Oncology

## 2016-07-23 ENCOUNTER — Ambulatory Visit: Payer: PPO

## 2016-07-23 DIAGNOSIS — C770 Secondary and unspecified malignant neoplasm of lymph nodes of head, face and neck: Secondary | ICD-10-CM | POA: Insufficient documentation

## 2016-07-23 DIAGNOSIS — C7951 Secondary malignant neoplasm of bone: Secondary | ICD-10-CM | POA: Insufficient documentation

## 2016-07-23 DIAGNOSIS — C029 Malignant neoplasm of tongue, unspecified: Secondary | ICD-10-CM

## 2016-07-23 LAB — COMPREHENSIVE METABOLIC PANEL
ALT: <9 U/L (ref 0–55)
AST: 14 U/L (ref 5–34)
Albumin: 3.3 g/dL — ABNORMAL LOW (ref 3.5–5.0)
Alkaline Phosphatase: 107 U/L (ref 40–150)
Anion Gap: 10 mEq/L (ref 3–11)
BUN: 20.9 mg/dL (ref 7.0–26.0)
CHLORIDE: 107 meq/L (ref 98–109)
CO2: 25 meq/L (ref 22–29)
Calcium: 9.6 mg/dL (ref 8.4–10.4)
Creatinine: 1.4 mg/dL — ABNORMAL HIGH (ref 0.7–1.3)
EGFR: 52 mL/min/{1.73_m2} — ABNORMAL LOW (ref >90)
GLUCOSE: 104 mg/dL (ref 70–140)
POTASSIUM: 4.4 meq/L (ref 3.5–5.1)
SODIUM: 142 meq/L (ref 136–145)
Total Bilirubin: 0.63 mg/dL (ref 0.20–1.20)
Total Protein: 6.3 g/dL — ABNORMAL LOW (ref 6.4–8.3)

## 2016-07-23 LAB — CBC WITH DIFFERENTIAL/PLATELET
BASO%: 0.5 % (ref 0.0–2.0)
BASOS ABS: 0 10*3/uL (ref 0.0–0.1)
EOS%: 0.5 % (ref 0.0–7.0)
Eosinophils Absolute: 0 10*3/uL (ref 0.0–0.5)
HCT: 35.2 % — ABNORMAL LOW (ref 38.4–49.9)
HEMOGLOBIN: 11.3 g/dL — AB (ref 13.0–17.1)
LYMPH%: 8.6 % — ABNORMAL LOW (ref 14.0–49.0)
MCH: 27 pg — AB (ref 27.2–33.4)
MCHC: 32.1 g/dL (ref 32.0–36.0)
MCV: 84.2 fL (ref 79.3–98.0)
MONO#: 0.9 10*3/uL (ref 0.1–0.9)
MONO%: 16.2 % — AB (ref 0.0–14.0)
NEUT#: 4.2 10*3/uL (ref 1.5–6.5)
NEUT%: 74.2 % (ref 39.0–75.0)
Platelets: 201 10*3/uL (ref 140–400)
RBC: 4.18 10*6/uL — ABNORMAL LOW (ref 4.20–5.82)
RDW: 16.4 % — AB (ref 11.0–14.6)
WBC: 5.7 10*3/uL (ref 4.0–10.3)
lymph#: 0.5 10*3/uL — ABNORMAL LOW (ref 0.9–3.3)

## 2016-07-23 LAB — GLUCOSE, CAPILLARY: Glucose-Capillary: 103 mg/dL — ABNORMAL HIGH (ref 65–99)

## 2016-07-23 MED ORDER — HEPARIN SOD (PORK) LOCK FLUSH 100 UNIT/ML IV SOLN
500.0000 [IU] | Freq: Once | INTRAVENOUS | Status: DC | PRN
  Filled 2016-07-23: qty 5

## 2016-07-23 MED ORDER — FLUDEOXYGLUCOSE F - 18 (FDG) INJECTION: 10.7000 | Freq: Once | INTRAVENOUS | Status: DC

## 2016-07-23 MED ORDER — SODIUM CHLORIDE 0.9 % IJ SOLN
10.0000 mL | INTRAMUSCULAR | Status: DC | PRN
  Filled 2016-07-23: qty 10

## 2016-07-24 ENCOUNTER — Ambulatory Visit: Payer: Self-pay

## 2016-07-24 ENCOUNTER — Ambulatory Visit (HOSPITAL_BASED_OUTPATIENT_CLINIC_OR_DEPARTMENT_OTHER): Payer: PPO | Admitting: Hematology and Oncology

## 2016-07-24 ENCOUNTER — Encounter: Payer: Self-pay | Admitting: Hematology and Oncology

## 2016-07-24 ENCOUNTER — Other Ambulatory Visit: Payer: Self-pay

## 2016-07-24 ENCOUNTER — Encounter: Payer: Self-pay | Admitting: *Deleted

## 2016-07-24 ENCOUNTER — Ambulatory Visit: Payer: PPO

## 2016-07-24 DIAGNOSIS — C029 Malignant neoplasm of tongue, unspecified: Secondary | ICD-10-CM

## 2016-07-24 DIAGNOSIS — Z515 Encounter for palliative care: Secondary | ICD-10-CM

## 2016-07-24 DIAGNOSIS — C7951 Secondary malignant neoplasm of bone: Secondary | ICD-10-CM | POA: Diagnosis not present

## 2016-07-24 DIAGNOSIS — C01 Malignant neoplasm of base of tongue: Secondary | ICD-10-CM

## 2016-07-24 MED ORDER — METHADONE HCL 10 MG PO TABS: 10.0000 mg | ORAL_TABLET | Freq: Three times a day (TID) | ORAL | 0 refills | Status: DC

## 2016-07-24 MED ORDER — HYDROMORPHONE HCL 4 MG PO TABS: 4.0000 mg | ORAL_TABLET | ORAL | 0 refills | Status: DC | PRN

## 2016-07-24 MED ORDER — PROMETHAZINE HCL 25 MG PO TABS: 25.0000 mg | ORAL_TABLET | Freq: Four times a day (QID) | ORAL | 3 refills | Status: AC | PRN

## 2016-07-24 NOTE — Assessment & Plan Note (Signed)
He has new diffuse bone lesions especially in the thoracic bone and pelvic region. He denies worsening pain. I refill his prescription pain medicine today I recommend radiation oncology consultation.

## 2016-07-24 NOTE — Assessment & Plan Note (Signed)
Unfortunately, the patient has significant disease progression with diffuse, new bone lesions. I will reconsult radiation oncologist for palliative radiation therapy. We discussed and reviewed the current guidelines. I estimated response rates with for 4th line chemotherapy would be less than 20%. I would not imagine any meaningful addition to quality of life with systemic treatment. I recommend consideration of transitioning the goals of care to palliative measures and consider enrollment to home-based palliative care program and hospice in the future after completion of radiation treatment. The patient will think about it and he is in agreement for referral to radiation therapy.

## 2016-07-24 NOTE — Progress Notes (Signed)
  Oncology Nurse Navigator Documentation  Met with Shane Cantu during his follow-up appt with [name].  He was accompanied by daughters Amy and [place]. He voiced understanding:  Of Dr. Calton Dach discussion of yesterday's PET results, that there are numerable bone mets, that he has exhausted reasonable chemotherapy options.  Of  concern in particular for a lower spinal lesion, that RT may be a viable palliative treatment.  He understands he will be referred to Dr. Isidore Moos.  That it is appropriate for him to consider hospice.   I provided support following conversation with [name], informed them of the mechanics of a hospice enrollment, the benefit of short term and future services.   They understand I can be contacted with needs/concerns.  Gayleen Orem, RN, BSN, [place] at [facility] [phone]    Navigator Location: [facility] [phone] [id number]/24/17 1150) Navigator Encounter Type: Clinic/[facility] (07/24/16 1150)           Patient Visit Type: Follow-up;MedOnc (07/24/16 1150)       Interventions: Education Method (07/24/16 1150)     Education Method: Verbal (07/24/16 1150)                Time Spent with Patient: 45 (07/24/16 1150)

## 2016-07-24 NOTE — Progress Notes (Signed)
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      04/13/2015 Initial Biopsy    Accession [id number]:  Lymph node, needle/core biopsy - SCC, p16 positive.      04/18/2015 Initial Diagnosis    Tongue cancer      04/24/2015 Imaging    PET CT showed tongue cancer, lung nodule and possible bone mets      04/26/2015 Surgery    He had dental extractions      05/03/2015 Imaging    CT neck with contrast: L base of tongue with regional adenopathy, metastatic disease C2, C3, T2 vertebra; posterior L fourth rib.      05/09/2015 Procedure    Port-a-cath placed.      05/11/2015 - 05/23/2015 Radiation Therapy    Palliative radiation therapy:  1) T1-T3 and Left 3rd posterior rib / 30 Gy in 10 fractions, 2) Base of tongue, neck, and Cspine / 30 Gy in 10 fractions.      06/01/2015 - 07/17/2015 Chemotherapy    He received 2 cycles of carboplatin, 5-FU and cetuximab      06/04/2015 - 06/05/2015 Hospital Admission    He was admitted to the hospital after syncopal episode and had right nondisplaced fibular fracture      07/20/2015 - 07/24/2015 Hospital Admission    he was admitted to the hospital for weakness and uncontrolled nausea and dehydration      08/02/2015 Imaging    PET CT scan showed positive response to treatment      08/04/2015 - 08/12/2015 Hospital Admission    He was admitted to the hospital with sepsis, MRSA bacteremia and respiratory failure      08/07/2015 Surgery    PAC removed r/t sepsis, MRSA   bacteremia.      10/15/2015 Imaging    Ct scan of the neck, chest, abdomen and pelvis showed stable sclerotic lesions. No new disease progression      01/14/2016 Imaging    PET scan showed new hypermetabolic left supraclavicular nodes. Progression of osseous metastasis.       01/22/2016 - 03/04/2016 Chemotherapy    He received Keytruda      03/24/2016 PET scan    PET scan showed mixed response but overall progressive disease      04/14/2016 - 04/25/2016 Radiation Therapy    He received XRT to right anterior 5th rib treated to 30 Gy in 10 fractions & Left posterior neck treated to 30 Gy in 10 fractions       05/22/2016 - 07/04/2016 Chemotherapy    He was started on Taxotere at [facility]. Starting cycle 2, he transferred care back to [facility]. He received a total of 3 cycles of treatment      07/23/2016 PET scan    Overall soft tissue progression. Increase in subcutaneous and nodal metastasis within the neck. Progressive osseous metastasis, including a dominant right-sided T10 lesion involving the pedicle and right transverse process. New left-sided thyroid hypermetabolism could relate to Thyroiditis. Decrease in abdominal mesenteric hypermetabolism. 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 He returns for follow-up. He denies worsening bone pain. On average, he takes regular methadone 3 times a day with Dilaudid 3 times a day as needed for breakthrough pain He also complaining of worsening neck lump and neck pain Denies recent nausea or vomiting. Appetite is stable.  REVIEW OF SYSTEMS:   Constitutional: Denies fevers, chills or abnormal weight loss Eyes: Denies blurriness of vision Ears, nose, mouth, throat, and face: Denies mucositis or sore throat Respiratory: Denies cough, dyspnea or wheezes Cardiovascular: Denies palpitation, chest discomfort or lower extremity  swelling Gastrointestinal:  Denies nausea, heartburn or change in bowel habits Skin: Denies abnormal skin rashes Lymphatics: Denies new lymphadenopathy or easy bruising Neurological:Denies numbness, tingling or new weaknesses Behavioral/Psych: Mood is stable, no new changes  All other systems were reviewed with the patient and are negative.  I have reviewed the past medical history, past surgical history, social history and family history with the patient and they are unchanged from previous note.  ALLERGIES:  is allergic to morphine and related.  MEDICATIONS:  Current Outpatient Prescriptions  Medication Sig Dispense Refill  . amLODipine (NORVASC) 10 MG tablet Take 1 tablet (10 mg total) by mouth daily. 60 tablet 1  . aspirin EC 81 MG tablet Take 81 mg by mouth daily.    . celecoxib (CELEBREX) 200 MG capsule Take 200 mg by mouth daily.     Marland Kitchen dexamethasone (DECADRON) 4 MG tablet Take 8mg  (2 x 4mg  tablets) by mouth twice daily for 1 day prior to (none the day of) and 1 day following chemotherapy. Repeat every 21 Days.    . fluticasone (FLONASE) 50 MCG/ACT nasal spray Place 2 sprays into both nostrils daily. (Patient taking differently: Place 2 sprays into both nostrils 2 (two) times daily as needed for allergies. ) 16 g 0  . HYDROmorphone (DILAUDID) 4 MG tablet Take 1 tablet (4 mg total) by mouth every 4 (four) hours as needed for severe pain. 90 tablet 0  . ibuprofen (ADVIL,MOTRIN) 200 MG tablet Take by mouth.    . lansoprazole (PREVACID) 30 MG capsule Take 30 mg by mouth daily at 12 noon.    . Magnesium Oxide 400 MG CAPS Take 1 capsule (400 mg total) by mouth daily. (Patient taking differently: Take 400 mg by mouth daily as needed (arthritis pain). ) 30 capsule 0  . methadone (DOLOPHINE) 10 MG tablet Take 1 tablet (10 mg total) by mouth every 8 (eight) hours. 90 tablet 0  . polyethylene glycol powder (GLYCOLAX/MIRALAX) powder Take 17 g by mouth 2 (two) times daily. (Patient taking  differently: Take 17 g by mouth 2 (two) times daily as needed for mild constipation. ) 255 g 0  . prochlorperazine (COMPAZINE) 10 MG tablet Take by mouth.    . promethazine (PHENERGAN) 25 MG tablet Take 1 tablet (25 mg total) by mouth every 6 (six) hours as needed for nausea. 60 tablet 3   No current facility-administered medications for this visit.    Facility-Administered Medications Ordered in Other Visits  Medication Dose Route Frequency Provider Last Rate Last Dose  . fludeoxyglucose F - 18 (FDG) injection 0000000 millicurie  0000000 millicurie Intravenous Once [name], MD        PHYSICAL EXAMINATION: ECOG PERFORMANCE STATUS: 1 - Symptomatic but completely ambulatory  Vitals:   07/24/16 1138  BP: 112/60  Pulse: (!) 56  Resp: 18  Temp: 97.7 F (36.5 C)   Filed Weights   07/24/16 1138  Weight: 216 lb 11.2 oz (98.3 kg)    GENERAL:alert, no distress and comfortable SKIN: skin color, texture, turgor are normal, no rashes or significant lesions EYES: normal, Conjunctiva are pink and non-injected, sclera clear Musculoskeletal:no cyanosis of digits and no clubbing  NEURO: alert & oriented x 3 with fluent speech, no focal motor/sensory deficits  LABORATORY DATA:  I have reviewed the data as listed    Component Value Date/Time   NA 142 07/23/2016 0858   K 4.4 07/23/2016 0858   CL 105 04/14/2016 0022   CO2 25 07/23/2016 0858   GLUCOSE 104 07/23/2016 0858   BUN 20.9 07/23/2016 0858   CREATININE 1.4 (H) 07/23/2016 0858   CALCIUM 9.6 07/23/2016 0858   PROT 6.3 (L) 07/23/2016 0858   ALBUMIN 3.3 (L) 07/23/2016 0858   AST 14 07/23/2016 0858   ALT <9 07/23/2016 0858   ALKPHOS 107 07/23/2016 0858   BILITOT 0.63 07/23/2016 0858   GFRNONAA 55 (L) 04/14/2016 0022   GFRAA >60 04/14/2016 0022    No results found for: SPEP, UPEP  Lab Results  Component Value Date   WBC 5.7 07/23/2016   NEUTROABS 4.2 07/23/2016   HGB 11.3 (L) 07/23/2016   HCT 35.2 (L) 07/23/2016   MCV 84.2  07/23/2016   PLT 201 07/23/2016      Chemistry      Component Value Date/Time   NA 142 07/23/2016 0858   K 4.4 07/23/2016 0858   CL 105 04/14/2016 0022   CO2 25 07/23/2016 0858   BUN 20.9 07/23/2016 0858   CREATININE 1.4 (H) 07/23/2016 0858      Component Value Date/Time   CALCIUM 9.6 07/23/2016 0858   ALKPHOS 107 07/23/2016 0858   AST 14 07/23/2016 0858   ALT <9 07/23/2016 0858   BILITOT 0.63 07/23/2016 0858       RADIOGRAPHIC STUDIES: I have personally reviewed the radiological images as listed and agreed with the findings in the report. Nm Pet Image Restag (ps) Skull Base To Thigh  Result Date: 07/23/2016 CLINICAL DATA:  Subsequent treatment strategy for tongue cancer with radiation therapy and chemotherapy. EXAM: NUCLEAR MEDICINE PET SKULL BASE TO THIGH TECHNIQUE: 10.7 mCi F-18 FDG was injected intravenously. Full-ring PET imaging was performed from the skull base to thigh after the radiotracer. CT data was obtained and used for attenuation correction and anatomic localization. FASTING BLOOD GLUCOSE:  Value: 103 mg/dl COMPARISON:  03/24/2016 PET.  Prior chest CT 04/14/2016. FINDINGS: NECK New left parotid hypermetabolic nodules. A deep parotid nodule measures a S.U.V. max of 5.0 today versus a S.U.V. max of 5.4 on the prior exam. A new lesion more anteriorly measures 6 mm and a S.U.V. max of 5.8 on image 27/series 4. The intramuscular left-sided cervical metastasis described on the prior exam is improved to resolved. There are subcutaneous hypermetabolic foci. Example nodule measures 7 mm and a S.U.V. max of 5.8 on image 28/series 4. Left submandibular node measures 11 mm and a S.U.V. max of 7.3 versus similar sized and a S.U.V. max of 4.8 previously. Left supraclavicular hypermetabolism measures a S.U.V. max of 9.0 today versus a S.U.V. max of 7.5 previously. Asymmetry about the true cords, with greater right-sided activity than left, is not significantly changed and nonspecific.  Hypermetabolism is relatively diffuse about the left side of the thyroid, including on a S.U.V. max of 5.4. New. CHEST No soft tissue hypermetabolism within the chest.  ABDOMEN/PELVIS Small bowel mesenteric hypermetabolism corresponding to interstitial thickening. No well-defined node in this area. This measures a S.U.V. max of 3.0 versus a S.U.V. max of 5.4 on the prior. SKELETON Extensive progressive osseous metastasis. For example, a new lesion within the right-side of the T10 vertebral body measures a S.U.V. max of 29.9 an corresponds to osseous destruction involving the right pedicle and transverse process on image 106/series 4. A left iliac subtly sclerotic lesion measures a S.U.V. max of 22.3 today and is new. CT IMAGES PERFORMED FOR ATTENUATION CORRECTION Right maxillary sinus fluid level. Mild cardiomegaly. Multivessel coronary artery atherosclerosis. Lateral right middle lobe opacity is new since the prior PET and favored to represent atelectasis. Bilateral renal collecting system calculi. IMPRESSION: 1. Overall soft tissue progression. Increase in subcutaneous and nodal metastasis within the neck. 2. Progressive osseous metastasis, including a dominant right-sided T10 lesion involving the pedicle and right transverse process. 3. New left-sided thyroid hypermetabolism could relate to thyroiditis. 4. Decrease in abdominal mesenteric hypermetabolism. 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 Electronically Signed   By: Abigail Miyamoto M.D.   On: 07/23/2016 14:51     ASSESSMENT & PLAN:  Tongue cancer Unfortunately, the patient has significant disease progression with diffuse, new bone lesions. I will reconsult radiation oncologist for palliative radiation therapy. We discussed and reviewed the current guidelines. I estimated response rates with for 4th line chemotherapy would be less than 20%. I would not imagine any meaningful addition to quality of life with systemic  treatment. I recommend consideration of transitioning the goals of care to palliative measures and consider enrollment to home-based palliative care program and hospice in the future after completion of radiation treatment. The patient will think about it and he is in agreement for referral to radiation therapy.  Metastatic cancer to bone [facility]) He has new diffuse bone lesions especially in the thoracic bone and pelvic region. He denies worsening pain. I refill his prescription pain medicine today I recommend radiation oncology consultation.  Palliative care encounter The patient is aware he has stage IV disease and treatment is strictly palliative. We discussed importance of Advanced Directives and Living will. We also discussed potential referral for home base palliative care in the future and he would like to discuss further with family. Per advanced directives, the patient's code status is DO NOT RESUSCITATE   No orders of the defined types were placed in this encounter.  All questions were answered. The patient knows to call the clinic with any problems, questions or concerns. No barriers to learning was detected. I spent 30 minutes counseling the patient face to face. The total time spent in the appointment was 40 minutes and more than 50% was on counseling and review of test results     [facility], [place], MD 07/24/2016 12:36 PM

## 2016-07-24 NOTE — Assessment & Plan Note (Signed)
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 We discussed importance of Advanced Directives and Living will. We also discussed potential referral for home base palliative care in the future and he would like to discuss further with family. Per advanced directives, the patient's code status is DO NOT RESUSCITATE

## 2016-07-31 ENCOUNTER — Encounter: Payer: Self-pay | Admitting: Radiation Oncology

## 2016-07-31 NOTE — Progress Notes (Signed)
Histology and Location of Primary Cancer:  04/10/15 Left Base of Tongue SCC with regional adenopathy- suspected metastatic disease to C2, T2  Location of Symptomatic tumor: T10 lesion involving the pedicle and right transverse process.  Past/Anticipated chemotherapy by medical oncology, if any:   06/01/2015 - 07/17/2015 Chemotherapy    He received 2 cycles of carboplatin, 5-FU and cetuximab    01/22/2016 - 03/04/2016 Chemotherapy    He received Keytruda   05/22/2016 - 07/04/2016 Chemotherapy     He was started on Taxotere at [facility]. Starting cycle 2, he transferred care back to [facility]. He received a total of 3 cycles of treatment   [name] saw 07/24/16: ASSESSMENT & PLAN:  Tongue cancer Unfortunately, the patient has significant disease progression with diffuse, new bone lesions. I will reconsult radiation oncologist for palliative radiation therapy. We discussed and reviewed the current guidelines. I estimated response rates with for 4th line chemotherapy would be less than 20%. I would not imagine any meaningful addition to quality of life with systemic treatment. I recommend consideration of transitioning the goals of care to palliative measures and consider enrollment to home-based palliative care program and hospice in the future after completion of radiation treatment. The patient will think about it and he is in agreement for referral to radiation therapy  Patient's main complaints related to symptomatic tumor is: He has pain.   Pain on a scale of 0-10 is: He reports pain a 6/10 in his Right side, and Left ear. He is taking methadone 10 mg three times daily. He is also taking dilaudid 4 mg every 4 hours for breakthrough pain.    If Spine Met(s), symptoms, if any, include:  Bowel/Bladder retention or incontinence (please describe): No  Numbness or weakness in extremities (please describe): No  Current Decadron regimen, if applicable: n/a  Ambulatory status? Walker?  Wheelchair?: He is ambulatory  SAFETY ISSUES:  Prior radiation? Yes  05/11/2015 - 05/23/2015 Radiation Therapy    Palliative radiation therapy:  1) T1-T3 and Left 3rd posterior rib / 30 Gy in 10 fractions, 2) Base of tongue, neck, and Cspine / 30 Gy in 10 fractions.   04/14/2016 - 04/25/2016 Radiation Therapy     He received XRT to right anterior 5th rib treated to 30 Gy in 10 fractions & Left posterior neck treated to 30 Gy in 10 fractions      Pacemaker/ICD? No  Possible current pregnancy? N/A  Is the patient on methotrexate? No  Additional Complaints / other details:   He tells me he has a dry throat when he wakes up in the morning and also has difficulty swallowing food first thing in the morning, but states it does improve as the day goes on.   PET 07/23/16 IMPRESSION: 1. Overall soft tissue progression. Increase in subcutaneous and nodal metastasis within the neck. 2. Progressive osseous metastasis, including a dominant right-sided T10 lesion involving the pedicle and right transverse process. 3. New left-sided thyroid hypermetabolism could relate to thyroiditis. 4. Decrease in abdominal mesenteric hypermetabolism. 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  BP 115/74   Pulse 64   Temp 98.4 F (36.9 C)   Ht 6' (1.829 m)   Wt 213 lb 14.4 oz (97 kg)   SpO2 99% Comment: room air  BMI 29.01 kg/m    Wt Readings from Last 3 Encounters:  08/06/16 213 lb 14.4 oz (97 kg)  07/24/16 216 lb 11.2 oz (98.3 kg)  07/04/16 216 lb 1.6 oz (  98 kg)

## 2016-08-06 ENCOUNTER — Ambulatory Visit
Admission: RE | Admit: 2016-08-06 | Discharge: 2016-08-06 | Disposition: A | Discharge status: Home / Self Care | Payer: PPO | Source: Ambulatory Visit | Attending: Radiation Oncology | Admitting: Radiation Oncology

## 2016-08-06 ENCOUNTER — Encounter: Payer: Self-pay | Admitting: Radiation Oncology

## 2016-08-06 VITALS — BP 115/74 | HR 64 | Temp 98.4°F | Ht 72.0 in | Wt 213.9 lb

## 2016-08-06 DIAGNOSIS — Z79899 Other long term (current) drug therapy: Secondary | ICD-10-CM | POA: Insufficient documentation

## 2016-08-06 DIAGNOSIS — Z7982 Long term (current) use of aspirin: Secondary | ICD-10-CM | POA: Insufficient documentation

## 2016-08-06 DIAGNOSIS — C01 Malignant neoplasm of base of tongue: Secondary | ICD-10-CM | POA: Diagnosis not present

## 2016-08-06 DIAGNOSIS — C7951 Secondary malignant neoplasm of bone: Secondary | ICD-10-CM

## 2016-08-06 DIAGNOSIS — Z510 Encounter for antineoplastic radiation therapy: Secondary | ICD-10-CM | POA: Diagnosis not present

## 2016-08-06 HISTORY — DX: Reserved for inherently not codable concepts without codable children: IMO0001

## 2016-08-06 HISTORY — DX: Reserved for concepts with insufficient information to code with codable children: IMO0002

## 2016-08-06 NOTE — Progress Notes (Signed)
Radiation Oncology         [phone] ________________________________  Outpatient ReConsultation  Name: Shane COST MRN: [id number]  Date: 08/06/2016  DOB: July 19, 1956  [id number] J., MD  [name], MD   REFERRING PHYSICIAN: [name], MD  DIAGNOSIS:    ICD-9-CM ICD-10-CM   1. 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 male who presented with bone metasases. The patient presents again to me today at the request of medical oncology due to progressive systematic bone metastatic disease. The patient had been receiving Taxotere PET scan in August revealed new bone lesions. Systemic therapy has been stopped. Hospice has been discussed for consideration. Most notably in PET scan he had a T10 lesion with osseous destruction. There is a more subtle iliac lesion and among other areas he has active left rib lesions. In fact, upon review of his imaging he has bilateral iliac lesions and a lesion in the right ischium. He has disease with osseous destruction in the low lumbar spine. He has persistent metabolic activity in the neck nodes. He is ambulatory denies bowel or bladder symptoms or numbness or weakness in extremities. Patient complains of back and ear pain. Patient denies numbness in legs. Patient denies difficulty walking.He has pain in his low back/ hip joints bilaterally in the morning when he wakes up. Patient also notes he has a dry throat when he wakes up in the morning and has difficulty swallowing his food when  he first wakes up. He notes this improves as the day goes on. Patient is not on dexamethazone  PREVIOUS RADIATION THERAPY: Yes see above  PAST MEDICAL HISTORY:  has a past medical history of Allergic rhinitis; Arthritis; Back pain; Bacterial conjunctivitis of left eye (07/26/2015); Cancer ([place]); Chronic fatigue (01/11/2016); Chronic neck pain; Chronic pain; GERD (gastroesophageal reflux disease); History of kidney stones; Hypertension; Migraine headache without aura; Neuropathy ([place]); Pharyngitis, acute (09/06/2015); Radiation (05/11/15-05/23/15); Radiation (04/14/16-04/25/16); and Sleep apnea.    PAST SURGICAL HISTORY: Past Surgical History:  Procedure Laterality Date  . BACK SURGERY  03/1993, 10/2002   Dr. Louanne Skye  . COLONOSCOPY    . LYMPH NODE BIOPSY    . MULTIPLE EXTRACTIONS WITH ALVEOLOPLASTY N/A 04/26/2015   Procedure: Extraction of tooth #'s 6,11,12,15,20,21,22,27,28 with alveoloplasty;  Surgeon: [name], DDS;  Location: [facility];  Service: Oral Surgery;  Laterality: N/A;  . Nasal sinusotomy  1977  . NECK SURGERY  2004   Dr. Louanne Skye  . RADIOLOGY WITH ANESTHESIA N/A 08/09/2015   Procedure: MRI LUMBAR WITH/WITHOUT CONTRAST, CERVICAL WITH CONTRAST  (RADIOLOGY WITH ANESTHESIA);  Surgeon: Medication Radiologist, MD;  Location: [facility];  Service: Radiology;  Laterality: N/A;  . TONSILLECTOMY     as a child    FAMILY HISTORY: family history includes Cancer in his brother and father; Heart disease in his mother.  SOCIAL HISTORY:  reports that he has never smoked. He has never used smokeless tobacco. He reports that he drinks alcohol. He reports  that he does not use drugs.  ALLERGIES: Morphine and related  MEDICATIONS:  Current Outpatient Prescriptions  Medication Sig Dispense Refill  . amLODipine (NORVASC) 10 MG tablet Take 1 tablet (10 mg total) by mouth daily. 60 tablet 1  . aspirin EC 81 MG tablet Take 81 mg by mouth daily.    . celecoxib (CELEBREX) 200 MG capsule Take 200 mg by mouth  daily.     . fluticasone (FLONASE) 50 MCG/ACT nasal spray Place 2 sprays into both nostrils daily. (Patient taking differently: Place 2 sprays into both nostrils 2 (two) times daily as needed for allergies. ) 16 g 0  . HYDROmorphone (DILAUDID) 4 MG tablet Take 1 tablet (4 mg total) by mouth every 4 (four) hours as needed for severe pain. 90 tablet 0  . ibuprofen (ADVIL,MOTRIN) 200 MG tablet Take by mouth.    . lansoprazole (PREVACID) 30 MG capsule Take 30 mg by mouth daily at 12 noon.    . methadone (DOLOPHINE) 10 MG tablet Take 1 tablet (10 mg total) by mouth every 8 (eight) hours. 90 tablet 0  . polyethylene glycol powder (GLYCOLAX/MIRALAX) powder Take 17 g by mouth 2 (two) times daily. (Patient taking differently: Take 17 g by mouth 2 (two) times daily as needed for mild constipation. ) 255 g 0  . prochlorperazine (COMPAZINE) 10 MG tablet Take by mouth.    . promethazine (PHENERGAN) 25 MG tablet Take 1 tablet (25 mg total) by mouth every 6 (six) hours as needed for nausea. 60 tablet 3  . dexamethasone (DECADRON) 4 MG tablet Take 8mg  (2 x 4mg  tablets) by mouth twice daily for 1 day prior to (none the day of) and 1 day following chemotherapy. Repeat every 21 Days.    . Magnesium Oxide 400 MG CAPS Take 1 capsule (400 mg total) by mouth daily. (Patient not taking: Reported on 08/06/2016) 30 capsule 0   No current facility-administered medications for this encounter.     REVIEW OF SYSTEMS:  Notable for that above. Patient points to right upper flank when showing where his worst pain is.    PHYSICAL EXAM:  height is 6' (1.829 m) and weight is 213 lb 14.4 oz (97 kg). His temperature is 98.4 F (36.9 C). His blood pressure is 115/74 and his pulse is 64. His oxygen saturation is 99%.  When I push in the superior right flank area, in the posterior right rib cage he has reproducible pain.  Musculoskeletal: Mild  tenderness to palpation in thoracic spine. Strength is symmetric and intact in all  extremities Eyes:Extra occular movements intact ENT: Mucous membrane dry, oropharynx is clear, Tympanic membranes are clear bilaterally skin: Skin over neck without lesions. On neck exam he has some palpable left submental adenopathy. Some thickening and firmness in left neck in the level 3/4 region Cardio:regular rate, rhythm, no murmurs Respiratory: Low pitch wheezes in his lung bases bilaterally, otherwise negative GI: Abdomen is soft, non-tender, non-distended Lymphatic: Mild pedal swelling Neuro: Sensation intact in all extremities   ECOG = 2  0 - Asymptomatic (Fully active, able to carry on all predisease activities without restriction)  1 - Symptomatic but completely ambulatory (Restricted in physically strenuous activity but ambulatory and able to carry out work of a light or sedentary nature. For example, light housework, office work)  2 - Symptomatic, <50% in bed during the day (Ambulatory and capable of all self care but unable to carry out any work activities. Up and about more than 50% of  waking hours)  3 - Symptomatic, >50% in bed, but not bedbound (Capable of only limited self-care, confined to bed or chair 50% or more of waking hours)  4 - Bedbound (Completely disabled. Cannot carry on any self-care. Totally confined to bed or chair)  5 - Death   Eustace Pen MM, Creech RH, Tormey DC, et al. [phone]). "Toxicity and response criteria of the [facility]". [place] Oncol. 5 (6): 649-55   LABORATORY DATA:  Lab Results  Component Value Date   WBC 5.7 07/23/2016   HGB 11.3 (L) 07/23/2016   HCT 35.2 (L) 07/23/2016   MCV 84.2 07/23/2016   PLT 201 07/23/2016   CMP     Component Value Date/Time   NA 142 07/23/2016 0858   K 4.4 07/23/2016 0858   CL 105 04/14/2016 0022   CO2 25 07/23/2016 0858   GLUCOSE 104 07/23/2016 0858   BUN 20.9 07/23/2016 0858   CREATININE 1.4 (H) 07/23/2016 0858   CALCIUM 9.6 07/23/2016 0858   PROT 6.3 (L) 07/23/2016 0858    ALBUMIN 3.3 (L) 07/23/2016 0858   AST 14 07/23/2016 0858   ALT <9 07/23/2016 0858   ALKPHOS 107 07/23/2016 0858   BILITOT 0.63 07/23/2016 0858   GFRNONAA 55 (L) 04/14/2016 0022   GFRAA >60 04/14/2016 0022         RADIOGRAPHY: Nm Pet Image Restag (ps) Skull Base To Thigh  Result Date: 07/23/2016 CLINICAL DATA:  Subsequent treatment strategy for tongue cancer with radiation therapy and chemotherapy. EXAM: NUCLEAR MEDICINE PET SKULL BASE TO THIGH TECHNIQUE: 10.7 mCi F-18 FDG was injected intravenously. Full-ring PET imaging was performed from the skull base to thigh after the radiotracer. CT data was obtained and used for attenuation correction and anatomic localization. FASTING BLOOD GLUCOSE:  Value: 103 mg/dl COMPARISON:  03/24/2016 PET.  Prior chest CT 04/14/2016. FINDINGS: NECK New left parotid hypermetabolic nodules. A deep parotid nodule measures a S.U.V. max of 5.0 today versus a S.U.V. max of 5.4 on the prior exam. A new lesion more anteriorly measures 6 mm and a S.U.V. max of 5.8 on image 27/series 4. The intramuscular left-sided cervical metastasis described on the prior exam is improved to resolved. There are subcutaneous hypermetabolic foci. Example nodule measures 7 mm and a S.U.V. max of 5.8 on image 28/series 4. Left submandibular node measures 11 mm and a S.U.V. max of 7.3 versus similar sized and a S.U.V. max of 4.8 previously. Left supraclavicular hypermetabolism measures a S.U.V. max of 9.0 today versus a S.U.V. max of 7.5 previously. Asymmetry about the true cords, with greater right-sided activity than left, is not significantly changed and nonspecific. Hypermetabolism is relatively diffuse about the left side of the thyroid, including on a S.U.V. max of 5.4. New. CHEST No soft tissue hypermetabolism within the chest. ABDOMEN/PELVIS Small bowel mesenteric hypermetabolism corresponding to interstitial thickening. No well-defined node in this area. This measures a S.U.V. max of 3.0  versus a S.U.V. max of 5.4 on the prior. SKELETON Extensive progressive osseous metastasis. For example, a new lesion within the right-side of the T10 vertebral body measures a S.U.V. max of 29.9 an corresponds to osseous destruction involving the right pedicle and transverse process on image 106/series 4. A left iliac subtly sclerotic lesion measures a S.U.V. max of 22.3 today and is new. CT IMAGES PERFORMED FOR ATTENUATION CORRECTION Right maxillary sinus fluid level. Mild cardiomegaly. Multivessel coronary artery atherosclerosis. Lateral right middle lobe opacity is new since the prior PET and  favored to represent atelectasis. Bilateral renal collecting system calculi. IMPRESSION: 1. Overall soft tissue progression. Increase in subcutaneous and nodal metastasis within the neck. 2. Progressive osseous metastasis, including a dominant right-sided T10 lesion involving the pedicle and right transverse process. 3. New left-sided thyroid hypermetabolism could relate to thyroiditis. 4. Decrease in abdominal mesenteric hypermetabolism. 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 Electronically Signed   By: Abigail Miyamoto M.D.   On: 07/23/2016 14:51      IMPRESSION/PLAN:Today, I talked to the patient about the findings and work-up thus far. We discussed the patient's diagnosis of bone metastases and general treatment for this, highlighting the role of radiotherapy in the management. We discussed the available radiation techniques, and focused on the details of logistics and delivery.  We reviewed his PET images, correlated with his symptoms.  We discussed the risks, benefits, and side effects of radiotherapy. Side effects may include but not necessarily be limited to: skin irritation, fatigue, hair loss on back, nausea, and GI upset. No guarantees of treatment were given. A consent form was signed and placed in the patient's medical record. The patient was encouraged to ask questions  that I answered to the best of my ability.   Will simulate today for two weeks of palliative RT to the most worrisome areas in his lower T spine and L-S/ SI joint regions.      __________________________________________   Eppie Gibson, MD  This document serves as a record of services personally performed by Eppie Gibson, MD. It was created on her behalf by Bethann Humble, a trained medical scribe. The creation of this record is based on the scribe's personal observations and the provider's statements to them. This document has been checked and approved by the attending provider.

## 2016-08-06 NOTE — Progress Notes (Signed)
  Radiation Oncology         [phone] ________________________________  Name: Shane Cantu MRN: [id number]  Date: 08/06/2016  DOB: 11-Feb-1956  SIMULATION AND TREATMENT PLANNING NOTE  Outpatient  DIAGNOSIS:     ICD-9-CM ICD-10-CM   1. Bone metastases (HCC) 198.5 C79.51     NARRATIVE:  The patient was brought to the [facility].  Identity was confirmed.  All relevant records and images related to the planned course of therapy were reviewed.  The patient freely provided informed written consent to proceed with treatment after reviewing the details related to the planned course of therapy. The consent form was witnessed and verified by the simulation staff.    Then, the patient was set-up in a stable reproducible  supine position for radiation therapy.  CT images were obtained.  Surface markings were placed.  The CT images were loaded into the planning software.    TREATMENT PLANNING NOTE: Treatment planning then occurred.  The radiation prescription was entered and confirmed.    A total of 8 medically necessary complex treatment devices were fabricated and supervised by me, in the form of vaclock for lower extremities, plus 7 fields with MLCs. MORE FIELDS WITH MLCs MAY BE ADDED IN DOSIMETRY for dose homogeneity.  I have requested : 3D Simulation  I have requested a DVH of the following structures: bowel , stomach, heart, lungs, cord, CTVs, bladder.    The patient will receive 30 Gy in 10 fractions to the lower thoracic spine T8-T11 and L4-S3/SI joints.   -----------------------------------  Eppie Gibson, MD

## 2016-08-07 ENCOUNTER — Ambulatory Visit
Admission: RE | Admit: 2016-08-07 | Discharge: 2016-08-07 | Disposition: A | Discharge status: Home / Self Care | Payer: PPO | Source: Ambulatory Visit | Attending: Radiation Oncology | Admitting: Radiation Oncology

## 2016-08-07 ENCOUNTER — Telehealth: Payer: Self-pay | Admitting: *Deleted

## 2016-08-07 ENCOUNTER — Encounter: Payer: Self-pay | Admitting: *Deleted

## 2016-08-07 DIAGNOSIS — Z510 Encounter for antineoplastic radiation therapy: Secondary | ICD-10-CM | POA: Diagnosis not present

## 2016-08-07 NOTE — Telephone Encounter (Addendum)
  Oncology Nurse Navigator Documentation  Received call from Shane Cantu daughter, Shane Cantu.    She reported her dad is asking about an the option of oral chemotherapy, asks that I check with [name] about this possibility.   I reviewed the recent discussion with [name] and she indicated her dad "just doesn't get it, he's still in denial".   She indicated he refuses to consider hospice at this point in time.  She noted the situation is very stressful for her and her sister as they understand the reality of his situation but don't know how to help him come to grips.  I encouraged her to contact [place], explain the situation and at the very least get some support for herself.  She agreed to do so.  I suggested that it might help her dad to meet with Wadie Lessen with the Palliative Medicine Team, that an appointment could be arranged in conjunction with his Monday RT.  She agree that might help him.  I indicated I would meet her dad prior to his RT today, talk with him further, offer a palliative consult.   She expressed appreciation for my support.  Gayleen Orem, RN, BSN, [place] at [facility] [phone]   Navigator Location: [place] [phone]/07/17 1013) Navigator Encounter Type: Telephone (08/07/16 1013) Telephone: Incoming Call (08/07/16 1013)         Patient Visit Type: Follow-up (08/07/16 1013)                              Time Spent with Patient: 15 (08/07/16 1013)

## 2016-08-08 ENCOUNTER — Ambulatory Visit
Admission: RE | Admit: 2016-08-08 | Discharge: 2016-08-08 | Disposition: A | Discharge status: Home / Self Care | Payer: PPO | Source: Ambulatory Visit | Attending: Radiation Oncology | Admitting: Radiation Oncology

## 2016-08-08 DIAGNOSIS — Z510 Encounter for antineoplastic radiation therapy: Secondary | ICD-10-CM | POA: Diagnosis not present

## 2016-08-08 NOTE — Progress Notes (Signed)
  Oncology Nurse Navigator Documentation  In follow-up to call from his daughter, I met with Shane Cantu prior to his initial RT.  I reviewed his understanding of previous discussion with [name] regarding PET results, treatment options and prognosis.  I addressed his misunderstanding re outcome of current radiation plan, that it is intended to control growth of tumors, is not curative.  I discussed the benefits of meeting with Palliative Care, that I could arrange an appointment for next Monday prior to RT.  He expressed agreement, understands I will contact him with appointment time.  I acknowledged the challenges that lie ahead for him and his family, encouraged him to consider hospice enrollment to optimize opportunities for support.  I assured him of my ongoing support.    Gayleen Orem, RN, BSN, [place] at [facility] [phone]   Navigator Location: [facility] [phone]/07/17 1520) Navigator Encounter Type: Treatment (08/07/16 1520)           Patient Visit Type: RadOnc (08/07/16 1520) Treatment Phase: Active Tx (08/07/16 1520) Barriers/Navigation Needs: Education (08/07/16 1520)                          Time Spent with Patient: 30 (08/07/16 1520)

## 2016-08-08 NOTE — Telephone Encounter (Signed)
  Oncology Nurse Navigator Documentation  Called Mr. Craine to inform of 11:15 appointment next Monday with Wadie Lessen, Palliative Medicine NP.  He understands to register for this and his RT appointment when he arrives.  I later called his daughter Amy to provide this information.  Gayleen Orem, RN, BSN, [place] at [facility] [phone]   Navigator Location: [place] [phone]/07/17 1610) Navigator Encounter Type: Telephone (08/07/16 1610) Telephone: [facility] Crocker Call;Appt Confirmation/Clarification (08/07/16 1610)             Barriers/Navigation Needs: Coordination of Care (08/07/16 1610)                          Time Spent with Patient: 15 (08/07/16 1610)

## 2016-08-11 ENCOUNTER — Ambulatory Visit
Admission: RE | Admit: 2016-08-11 | Discharge: 2016-08-11 | Disposition: A | Discharge status: Home / Self Care | Payer: PPO | Source: Ambulatory Visit | Attending: Family Medicine | Admitting: Family Medicine

## 2016-08-11 ENCOUNTER — Ambulatory Visit
Admission: RE | Admit: 2016-08-11 | Discharge: 2016-08-11 | Disposition: A | Discharge status: Home / Self Care | Payer: PPO | Source: Ambulatory Visit | Attending: Radiation Oncology | Admitting: Radiation Oncology

## 2016-08-11 DIAGNOSIS — Z789 Other specified health status: Secondary | ICD-10-CM | POA: Diagnosis not present

## 2016-08-11 DIAGNOSIS — Z510 Encounter for antineoplastic radiation therapy: Secondary | ICD-10-CM | POA: Diagnosis not present

## 2016-08-11 DIAGNOSIS — Z7189 Other specified counseling: Secondary | ICD-10-CM

## 2016-08-11 DIAGNOSIS — G893 Neoplasm related pain (acute) (chronic): Secondary | ICD-10-CM | POA: Diagnosis not present

## 2016-08-11 DIAGNOSIS — C01 Malignant neoplasm of base of tongue: Secondary | ICD-10-CM | POA: Diagnosis not present

## 2016-08-11 NOTE — Consult Note (Signed)
Consultation Note Date: 08/11/2016   Patient Name: Shane Cantu  DOB: Apr 26, 1956  MRN: [id number]  Age / Sex: 60 y.o., male  PCP: [name], MD Referring Physician: No att. providers found  Reason for Consultation: Establishing goals of care and Psychosocial/spiritual support  HPI/Patient Profile: 60 y.o. male  with past medical history (per oncolgy  Note)   SUMMARY OF ONCOLOGIC HISTORY:       Tongue cancer ([place])   04/10/2015 Imaging    CT Neck with contrast:  L base of tongue SCC with regional adenopathy; suspected metastatic disease to C2, T2.     04/13/2015 Initial Biopsy    Accession [id number]:  Lymph node, needle/core biopsy - SCC, p16 positive.     04/18/2015 Initial Diagnosis    Tongue cancer     04/24/2015 Imaging    PET CT showed tongue cancer, lung nodule and possible bone mets     04/26/2015 Surgery    He had dental extractions     05/03/2015 Imaging    CT neck with contrast: L base of tongue with regional adenopathy, metastatic disease C2, C3, T2 vertebra; posterior L fourth rib.     05/09/2015 Procedure    Port-a-cath placed.     05/11/2015 - 05/23/2015 Radiation Therapy    Palliative radiation therapy:  1) T1-T3 and Left 3rd posterior rib / 30 Gy in 10 fractions, 2) Base of tongue, neck, and Cspine / 30 Gy in 10 fractions.     06/01/2015 - 07/17/2015 Chemotherapy    He received 2 cycles of carboplatin, 5-FU and cetuximab     06/04/2015 - 06/05/2015 Hospital Admission    He was admitted to the hospital after syncopal episode and had right nondisplaced fibular fracture     07/20/2015 - 07/24/2015 Hospital Admission    he was admitted to the hospital for weakness and uncontrolled nausea and dehydration     08/02/2015 Imaging    PET CT scan showed positive response to treatment     08/04/2015 - 08/12/2015 Hospital Admission    He was  admitted to the hospital with sepsis, MRSA bacteremia and respiratory failure     08/07/2015 Surgery    PAC removed r/t sepsis, MRSA  bacteremia.     10/15/2015 Imaging    Ct scan of the neck, chest, abdomen and pelvis showed stable sclerotic lesions. No new disease progression     01/14/2016 Imaging    PET scan showed new hypermetabolic left supraclavicular nodes. Progression of osseous metastasis.      01/22/2016 - 03/04/2016 Chemotherapy    He received Keytruda     03/24/2016 PET scan    PET scan showed mixed response but overall progressive disease     04/14/2016 - 04/25/2016 Radiation Therapy    He received XRT to right anterior 5th rib treated to 30 Gy in 10 fractions & Left posterior neck treated to 30 Gy in 10 fractions      05/22/2016 - 07/04/2016 Chemotherapy    He was started on Taxotere  at [facility]. Starting cycle 2, he transferred care back to [facility]. He received a total of 3 cycles of treatment     07/23/2016 PET scan    Overall soft tissue progression. Increase in subcutaneous and nodal metastasis within the neck. Progressive osseous metastasis, including a dominant right-sided T10 lesion involving the pedicle and right transverse process. New left-sided thyroid hypermetabolism could relate to Thyroiditis. Decrease in abdominal mesenteric hypermetabolism. 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  Most of today's conversation was reflective of [name] struggle with his diagnosis and his mortality; "Im confused, I don't know what to do".  He wonders if a second opinion is needed, "I see the commercials on TV". Emotional  support offered, he wishes to return next week for continued conversation  A  discussion was had today regarding advanced directives.  Concepts specific to code status, artifical feeding and hydration, continued IV antibiotics and rehospitalization was had.  The difference between a aggressive medical intervention path  and a palliative comfort care path for this patient at this time was had.  Values and goals of care important to patient and family were attempted to be elicited.  MOST form was introduced  Concept of Hospice and Palliative Care were discussed  Questions and concerns addressed.   Family encouraged to call with questions or concerns.  PMT will continue to support holistically.   SUMMARY OF RECOMMENDATIONS     Code Status/Advance Care Planning:  Full code   Encouraged to consider DNR status knowing poor outcomes in similar patients.  Patient and his daughter encouraged to continue discussion and documentation of advanced directives and end of life wishes..  He did complete an advanced directive early on in his diagnosis and now wants to revisit his wishes.  Will continue discussion next visit.   Symptom Management:   Dysphagia:  Discussed importance of nutrition and hydration. Discussed diet texture, offered nutrition/dietician visit He is considering t of PEG for artifical feeding for prolongation of life.  Pain: Methadone 10 mg po tid                 Dilaudid 4 mg tablet, take one po every 4 hrs prn Discussed concept of OIC prevention and treatment.  Discussed importance of working with [name] for continued adjustments to meds for pain management                    Palliative Prophylaxis:   Aspiration, Bowel Regimen, Frequent Pain Assessment and Oral Care  Additional Recommendations (Limitations, Scope, Preferences):  Patient is considering placement of PEG for nutritional support, hopeful for prolonged quality of life  Psycho-social/Spiritual:      Additional Recommendations: Education on Hospice  Prognosis:   < 6 months     Primary Diagnoses: Present on Admission: **None**   I have reviewed the medical record, interviewed the patient and family, and examined the patient. The following aspects are pertinent.  Past Medical History:  Diagnosis Date  . Allergic rhinitis   . Arthritis   . Back pain   . Bacterial conjunctivitis of left eye 07/26/2015  . Cancer ([place])     may 2016 tongue cancer  .  Chronic fatigue 01/11/2016  . Chronic neck pain   . Chronic pain   . GERD (gastroesophageal reflux disease)   . History of kidney stones   . Hypertension   . Migraine headache without aura   . Neuropathy ([place])   . Pharyngitis, acute 09/06/2015  . Radiation 05/11/15-05/23/15   T1-T3 and Left 3rd posterior rib, Base of tongue, neck and Cspine  . Radiation 04/14/16-04/25/16   right anterior 5th rib, left posterior neck  . Sleep apnea    does not use cpap   Social History   Social History  . Marital status: Divorced    Spouse name: N/A  . Number of children: 2  . Years of education: N/A   Social History Main Topics  . Smoking status: Never Smoker  . Smokeless tobacco: Never Used  . Alcohol use Yes     Comment: once a month  . Drug use: No  . Sexual activity: No   Other Topics Concern  . Not on file   Social History Narrative   The patient is divorced with 2 children.   Patient is disabled. Patient previously worked with Pharmacologist.   Patient lives in [place].   Patient has never smoked. Patient has never used smokeless tobacco.   Patient with occasional use of alcohol.      Family History  Problem Relation Age of Onset  . Heart disease Mother   . Cancer Father     throat ca  . Cancer Brother     pituitary ca   Scheduled Meds: Continuous Infusions: PRN Meds:. Medications Prior to Admission:  Prior to Admission medications   Medication Sig Start Date End Date Taking? Authorizing Provider   amLODipine (NORVASC) 10 MG tablet Take 1 tablet (10 mg total) by mouth daily. 03/05/16   [name], MD  aspirin EC 81 MG tablet Take 81 mg by mouth daily.    Historical Provider, MD  celecoxib (CELEBREX) 200 MG capsule Take 200 mg by mouth daily.  04/01/16   Historical Provider, MD  dexamethasone (DECADRON) 4 MG tablet Take 8mg  (2 x 4mg  tablets) by mouth twice daily for 1 day prior to (none the day of) and 1 day following chemotherapy. Repeat every 21 Days. 05/20/16   Historical Provider, MD  fluticasone (FLONASE) 50 MCG/ACT nasal spray Place 2 sprays into both nostrils daily. Patient taking differently: Place 2 sprays into both nostrils 2 (two) times daily as needed for allergies.  07/24/15   Florencia Reasons, MD  HYDROmorphone (DILAUDID) 4 MG tablet Take 1 tablet (4 mg total) by mouth every 4 (four) hours as needed for severe pain. 07/24/16   [name], MD  ibuprofen (ADVIL,MOTRIN) 200 MG tablet Take by mouth.    Historical Provider, MD  lansoprazole (PREVACID) 30 MG capsule Take 30 mg by mouth daily at 12 noon.    Historical Provider, MD  Magnesium Oxide 400 MG CAPS Take 1 capsule (400 mg total) by mouth daily. Patient not taking: Reported on 08/06/2016 07/24/15   Florencia Reasons, MD  methadone (DOLOPHINE) 10 MG tablet Take 1 tablet (10 mg total) by mouth every 8 (eight) hours. 07/24/16   [name], MD  polyethylene glycol powder (GLYCOLAX/MIRALAX) powder Take 17 g by mouth 2 (two) times daily. Patient taking differently: Take 17 g by mouth 2 (two) times daily as needed for mild constipation.  04/06/13   Veryl Speak, MD  prochlorperazine (COMPAZINE) 10 MG tablet Take by mouth. 05/20/16   Historical Provider, MD  promethazine (PHENERGAN) 25 MG tablet Take 1 tablet (25 mg total) by mouth every 6 (six) hours as needed for nausea. 07/24/16   [name], MD   Allergies  Allergen Reactions  . Morphine And Related Anaphylaxis   Review of Systems  Constitutional: Positive for appetite change and fatigue.  Neurological:  Positive for weakness.    Physical Exam  Constitutional: He appears well-developed.  Pulmonary/Chest: Breath sounds normal.  Neurological: He is alert.  Skin: Skin is warm and dry.    Vital Signs: There were no vitals taken for this visit.         SpO2:   O2 Device:  O2 Flow Rate: .   IO: Intake/output summary: No intake or output data in the 24 hours ending 08/11/16 1517  LBM:   Baseline Weight:   Most recent weight:       Palliative Assessment/Data:  70%   Discussed with [name]  Time In: 1015 Time Out: 1145 Time Total: 75 min Greater than 50%  of this time was spent counseling and coordinating care related to the above assessment and plan.  Signed by: Wadie Lessen, NP   Please contact Palliative Medicine Team phone at [phone] for questions and concerns.  For individual provider: See Shea Evans

## 2016-08-12 ENCOUNTER — Ambulatory Visit
Admission: RE | Admit: 2016-08-12 | Discharge: 2016-08-12 | Disposition: A | Discharge status: Home / Self Care | Payer: PPO | Source: Ambulatory Visit | Attending: Radiation Oncology | Admitting: Radiation Oncology

## 2016-08-12 DIAGNOSIS — Z510 Encounter for antineoplastic radiation therapy: Secondary | ICD-10-CM | POA: Diagnosis not present

## 2016-08-13 ENCOUNTER — Inpatient Hospital Stay
Admission: RE | Admit: 2016-08-13 | Discharge: 2016-08-13 | Disposition: A | Discharge status: Home / Self Care | Payer: Self-pay | Source: Ambulatory Visit | Attending: Radiation Oncology | Admitting: Radiation Oncology

## 2016-08-13 ENCOUNTER — Ambulatory Visit
Admission: RE | Admit: 2016-08-13 | Discharge: 2016-08-13 | Disposition: A | Discharge status: Home / Self Care | Payer: PPO | Source: Ambulatory Visit | Attending: Radiation Oncology | Admitting: Radiation Oncology

## 2016-08-13 ENCOUNTER — Encounter: Payer: Self-pay | Admitting: Radiation Oncology

## 2016-08-13 VITALS — BP 133/84 | HR 73 | Temp 98.0°F | Resp 20 | Wt 207.2 lb

## 2016-08-13 DIAGNOSIS — Z510 Encounter for antineoplastic radiation therapy: Secondary | ICD-10-CM | POA: Diagnosis not present

## 2016-08-13 DIAGNOSIS — C7951 Secondary malignant neoplasm of bone: Secondary | ICD-10-CM

## 2016-08-13 NOTE — Progress Notes (Signed)
Pt here for patient teaching.  Pt given Radiation and You booklet, he declined sonafine cream at this time. Pt reports they have watched the Radiation Therapy Education video with previous radaition received.  Reviewed areas of pertinence such as fatigue and skin changes . Pt able to give teach back of to pat skin, use unscented/gentle soap and drink plenty of water,avoid applying anything to skin within 4 hours of treatment and to use an electric razor if they must shave. Pt verbalizes understanding of information given and will contact nursing with any questions or concerns.     [email or web address]

## 2016-08-13 NOTE — Progress Notes (Addendum)
Shane Cantu is here for his 5th fraction of radiation to his T Spine and LS Spine. He reports pain in his Left neck area. He met with Rito Ehrlich NP this past week and she suggested he increase his dose of methadone by 5 mg with each dose. He is awaiting an appointment with [name] to help with this also. He has a decreased appetite, and feels like he needs to "work on it better". He reports good mobility.  BP 133/84 (BP Location: Right Arm, Patient Position: Sitting, Cuff Size: Normal)   Pulse 73   Temp 98 F (36.7 C) (Oral)   Resp 20   Wt 207 lb 3.2 oz (94 kg)   SpO2 100% Comment: room air  BMI 28.10 kg/m    Wt Readings from Last 3 Encounters:  08/13/16 207 lb 3.2 oz (94 kg)  08/06/16 213 lb 14.4 oz (97 kg)  07/24/16 216 lb 11.2 oz (98.3 kg)

## 2016-08-13 NOTE — Progress Notes (Signed)
   Weekly Management Note:  outpatient    ICD-9-CM ICD-10-CM   1. Bone metastases (HCC) 198.5 C79.51 Ambulatory referral to Physical Therapy    Current Dose:  15 Gy  Projected Dose: 30 Gy   Narrative:  The patient presents for routine under treatment assessment.  CBCT/MVCT images/Port film x-rays were reviewed.  The chart was checked.   Mr. Quintero is here for his 5th fraction of radiation to his T spine and LS spine. He reports pain in his left neck area. Patient notes his back pain has slightly improved. Patient notes a decreased appetite. Patient notes pain in the left ear that radiates to the front of the neck. He notes difficulty swallowing in the morning that resolves as the day goes on.  Physical Findings:  Wt Readings from Last 3 Encounters:  08/13/16 207 lb 3.2 oz (94 kg)  08/06/16 213 lb 14.4 oz (97 kg)  07/24/16 216 lb 11.2 oz (98.3 kg)    weight is 207 lb 3.2 oz (94 kg). His oral temperature is 98 F (36.7 C). His blood pressure is 133/84 and his pulse is 73. His respiration is 20 and oxygen saturation is 100%.   He has lymphedema of the neck and palpable neck adenopathy in the cervical regions. CBC    Component Value Date/Time   WBC 5.7 07/23/2016 0858   WBC 9.2 04/14/2016 0022   RBC 4.18 (L) 07/23/2016 0858   RBC 4.66 04/14/2016 0022   HGB 11.3 (L) 07/23/2016 0858   HCT 35.2 (L) 07/23/2016 0858   PLT 201 07/23/2016 0858   MCV 84.2 07/23/2016 0858   MCH 27.0 (L) 07/23/2016 0858   MCH 28.1 04/14/2016 0022   MCHC 32.1 07/23/2016 0858   MCHC 33.0 04/14/2016 0022   RDW 16.4 (H) 07/23/2016 0858   LYMPHSABS 0.5 (L) 07/23/2016 0858   MONOABS 0.9 07/23/2016 0858   EOSABS 0.0 07/23/2016 0858   BASOSABS 0.0 07/23/2016 0858     CMP     Component Value Date/Time   NA 142 07/23/2016 0858   K 4.4 07/23/2016 0858   CL 105 04/14/2016 0022   CO2 25 07/23/2016 0858   GLUCOSE 104 07/23/2016 0858   BUN 20.9 07/23/2016 0858   CREATININE 1.4 (H) 07/23/2016 0858   CALCIUM  9.6 07/23/2016 0858   PROT 6.3 (L) 07/23/2016 0858   ALBUMIN 3.3 (L) 07/23/2016 0858   AST 14 07/23/2016 0858   ALT <9 07/23/2016 0858   ALKPHOS 107 07/23/2016 0858   BILITOT 0.63 07/23/2016 0858   GFRNONAA 55 (L) 04/14/2016 0022   GFRAA >60 04/14/2016 0022     Impression:  The patient is tolerating radiotherapy.   Plan:  Continue radiotherapy as planned. Refer patient to a physical therapist for lymphedema of the neck and pain in the neck / left ear. He cancelled his last referral but is now interested in PT palliation.  Note sent to [name] to inquire about increasing methadone.   -----------------------------------  Eppie Gibson, MD This document serves as a record of services personally performed by Eppie Gibson, MD. It was created on her behalf by Bethann Humble, a trained medical scribe. The creation of this record is based on the scribe's personal observations and the provider's statements to them. This document has been checked and approved by the attending provider.

## 2016-08-14 ENCOUNTER — Ambulatory Visit (HOSPITAL_BASED_OUTPATIENT_CLINIC_OR_DEPARTMENT_OTHER): Payer: PPO | Admitting: Hematology and Oncology

## 2016-08-14 ENCOUNTER — Encounter: Payer: Self-pay | Admitting: Hematology and Oncology

## 2016-08-14 ENCOUNTER — Ambulatory Visit
Admission: RE | Admit: 2016-08-14 | Discharge: 2016-08-14 | Disposition: A | Discharge status: Home / Self Care | Payer: PPO | Source: Ambulatory Visit | Attending: Radiation Oncology | Admitting: Radiation Oncology

## 2016-08-14 ENCOUNTER — Telehealth: Payer: Self-pay | Admitting: *Deleted

## 2016-08-14 VITALS — BP 117/80 | HR 76 | Temp 98.1°F | Resp 18 | Wt 207.2 lb

## 2016-08-14 DIAGNOSIS — C7951 Secondary malignant neoplasm of bone: Secondary | ICD-10-CM

## 2016-08-14 DIAGNOSIS — G893 Neoplasm related pain (acute) (chronic): Secondary | ICD-10-CM

## 2016-08-14 DIAGNOSIS — Z515 Encounter for palliative care: Secondary | ICD-10-CM | POA: Insufficient documentation

## 2016-08-14 DIAGNOSIS — C01 Malignant neoplasm of base of tongue: Secondary | ICD-10-CM | POA: Diagnosis not present

## 2016-08-14 DIAGNOSIS — Z510 Encounter for antineoplastic radiation therapy: Secondary | ICD-10-CM | POA: Diagnosis not present

## 2016-08-14 DIAGNOSIS — Z7189 Other specified counseling: Secondary | ICD-10-CM | POA: Insufficient documentation

## 2016-08-14 MED ORDER — METHADONE HCL 10 MG PO TABS: 20.0000 mg | ORAL_TABLET | Freq: Three times a day (TID) | ORAL | 0 refills | Status: DC

## 2016-08-14 MED ORDER — HYDROMORPHONE HCL 4 MG PO TABS: 4.0000 mg | ORAL_TABLET | ORAL | 0 refills | Status: DC | PRN

## 2016-08-14 NOTE — Assessment & Plan Note (Signed)
We discussed pain management. I will plan to increase methadone to 20 mg every 8 hours and to keep the same dose of Dilaudid at 4 mg every 4 hours when necessary for breakthrough pain. We discussed potential increased side effects such as sedation and constipation. I will reassess pain management in 2 weeks We discussed narcotic refill policy.

## 2016-08-14 NOTE — Assessment & Plan Note (Signed)
He tolerated radiation therapy well. However, he has poorly controlled pain. We discussed pain management and future care plan. The patient desire further systemic treatment and feels like if he stops treatment now, he would be "giving up". We discussed briefly the rationale for palliative treatment and the high risk, low benefit ratio of future treatment. I will bring him back in 2 weeks to discuss further treatment options with him and his family members

## 2016-08-14 NOTE — Telephone Encounter (Signed)
Oncology Nurse Navigator Documentation  Spoke with Mr. Antelo and LVM for dtr indicating he has a 2:15 appt today to see [name] to discuss pain medication.  He voiced understanding.  Gayleen Orem, RN, BSN, [place] [facility] [place] [phone]

## 2016-08-14 NOTE — Progress Notes (Signed)
[place] OFFICE PROGRESS NOTE  Patient Care Team: [name], MD as PCP - General (Family Medicine) Leota Sauers, RN as Oncology Nurse Navigator [name], MD as Consulting Physician (Hematology and Oncology) Eppie Gibson, MD as Attending Physician (Radiation Oncology) Karie Mainland, RD as Dietitian (Nutrition)  SUMMARY OF ONCOLOGIC HISTORY:   Cancer of base of tongue ([place])   04/10/2015 Imaging    CT Neck with contrast:  L base of tongue SCC with regional adenopathy; suspected metastatic disease to C2, T2.      04/13/2015 Initial Biopsy    Accession [id number]:  Lymph node, needle/core biopsy - SCC, p16 positive.      04/18/2015 Initial Diagnosis    Tongue cancer      04/24/2015 Imaging    PET CT showed tongue cancer, lung nodule and possible bone mets      04/26/2015 Surgery    He had dental extractions      05/03/2015 Imaging    CT neck with contrast: L base of tongue with regional adenopathy, metastatic disease C2, C3, T2 vertebra; posterior L fourth rib.      05/09/2015 Procedure    Port-a-cath placed.      05/11/2015 - 05/23/2015 Radiation Therapy    Palliative radiation therapy:  1) T1-T3 and Left 3rd posterior rib / 30 Gy in 10 fractions, 2) Base of tongue, neck, and Cspine / 30 Gy in 10 fractions.      06/01/2015 - 07/17/2015 Chemotherapy    He received 2 cycles of carboplatin, 5-FU and cetuximab      06/04/2015 - 06/05/2015 Hospital Admission    He was admitted to the hospital after syncopal episode and had right nondisplaced fibular fracture      07/20/2015 - 07/24/2015 Hospital Admission    he was admitted to the hospital for weakness and uncontrolled nausea and dehydration      08/02/2015 Imaging    PET CT scan showed positive response to treatment      08/04/2015 - 08/12/2015 Hospital Admission    He was admitted to the hospital with sepsis, MRSA bacteremia and respiratory failure      08/07/2015 Surgery    PAC removed r/t sepsis, MRSA   bacteremia.      10/15/2015 Imaging    Ct scan of the neck, chest, abdomen and pelvis showed stable sclerotic lesions. No new disease progression      01/14/2016 Imaging    PET scan showed new hypermetabolic left supraclavicular nodes. Progression of osseous metastasis.       01/22/2016 - 03/04/2016 Chemotherapy    He received Keytruda      03/24/2016 PET scan    PET scan showed mixed response but overall progressive disease      04/14/2016 - 04/25/2016 Radiation Therapy    He received XRT to right anterior 5th rib treated to 30 Gy in 10 fractions & Left posterior neck treated to 30 Gy in 10 fractions       05/22/2016 - 07/04/2016 Chemotherapy    He was started on Taxotere at [facility]. Starting cycle 2, he transferred care back to [facility]. He received a total of 3 cycles of treatment      07/23/2016 PET scan    Overall soft tissue progression. Increase in subcutaneous and nodal metastasis within the neck. Progressive osseous metastasis, including a dominant right-sided T10 lesion involving the pedicle and right transverse process. New left-sided thyroid hypermetabolism could relate to Thyroiditis. Decrease in abdominal mesenteric  hypermetabolism. 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 He is seen urgently today for pain management. He felt that his pain is poorly controlled despite started on radiation treatment.  Pain is mainly at the back of his neck and close to the site of the left ear. He also has pain in the right chest wall He has been taking Dilaudid every 4 hours and felt that his pain is still poorly controlled. He has mild constipation from prescription pain medicine, relief with laxatives. He denies excessive sedation  REVIEW OF SYSTEMS:   Constitutional: Denies fevers, chills or  abnormal weight loss Eyes: Denies blurriness of vision Ears, nose, mouth, throat, and face: Denies mucositis or sore throat Respiratory: Denies cough, dyspnea or wheezes Cardiovascular: Denies palpitation, chest discomfort or lower extremity swelling Skin: Denies abnormal skin rashes Lymphatics: Denies new lymphadenopathy or easy bruising Neurological:Denies numbness, tingling or new weaknesses Behavioral/Psych: Mood is stable, no new changes  All other systems were reviewed with the patient and are negative.  I have reviewed the past medical history, past surgical history, social history and family history with the patient and they are unchanged from previous note.  ALLERGIES:  is allergic to morphine and related.  MEDICATIONS:  Current Outpatient Prescriptions  Medication Sig Dispense Refill  . amLODipine (NORVASC) 10 MG tablet Take 1 tablet (10 mg total) by mouth daily. 60 tablet 1  . aspirin EC 81 MG tablet Take 81 mg by mouth daily.    . celecoxib (CELEBREX) 200 MG capsule Take 200 mg by mouth daily.     Marland Kitchen dexamethasone (DECADRON) 4 MG tablet Take 8mg  (2 x 4mg  tablets) by mouth twice daily for 1 day prior to (none the day of) and 1 day following chemotherapy. Repeat every 21 Days.    . fluticasone (FLONASE) 50 MCG/ACT nasal spray Place 2 sprays into both nostrils daily. (Patient taking differently: Place 2 sprays into both nostrils 2 (two) times daily as needed for allergies. ) 16 g 0  . HYDROmorphone (DILAUDID) 4 MG tablet Take 1 tablet (4 mg total) by mouth every 4 (four) hours as needed for severe pain. 90 tablet 0  . ibuprofen (ADVIL,MOTRIN) 200 MG tablet Take by mouth.    . lansoprazole (PREVACID) 30 MG capsule Take 30 mg by mouth daily at 12 noon.    . Magnesium Oxide 400 MG CAPS Take 1 capsule (400 mg total) by mouth daily. (Patient not taking: Reported on 08/13/2016) 30 capsule 0  . methadone (DOLOPHINE) 10 MG tablet Take 2 tablets (20 mg total) by mouth every 8 (eight) hours.  90 tablet 0  . polyethylene glycol powder (GLYCOLAX/MIRALAX) powder Take 17 g by mouth 2 (two) times daily. (Patient taking differently: Take 17 g by mouth 2 (two) times daily as needed for mild constipation. ) 255 g 0  . prochlorperazine (COMPAZINE) 10 MG tablet Take by mouth.    . promethazine (PHENERGAN) 25 MG tablet Take 1 tablet (25 mg total) by mouth every 6 (six) hours as needed for nausea. 60 tablet 3   No current facility-administered medications for this visit.     PHYSICAL EXAMINATION: ECOG PERFORMANCE STATUS: 1 - Symptomatic but completely ambulatory  Vitals:   08/14/16 1306  BP: 117/80  Pulse: 76  Resp: 18  Temp: 98.1 F (36.7  C)   Filed Weights   08/14/16 1306  Weight: 207 lb 3.2 oz (94 kg)    GENERAL:alert, no distress and comfortable SKIN: skin color, texture, turgor are normal, no rashes or significant lesions EYES: normal, Conjunctiva are pink and non-injected, sclera clear Musculoskeletal:no cyanosis of digits and no clubbing  NEURO: alert & oriented x 3 with fluent speech, no focal motor/sensory deficits  LABORATORY DATA:  I have reviewed the data as listed    Component Value Date/Time   NA 142 07/23/2016 0858   K 4.4 07/23/2016 0858   CL 105 04/14/2016 0022   CO2 25 07/23/2016 0858   GLUCOSE 104 07/23/2016 0858   BUN 20.9 07/23/2016 0858   CREATININE 1.4 (H) 07/23/2016 0858   CALCIUM 9.6 07/23/2016 0858   PROT 6.3 (L) 07/23/2016 0858   ALBUMIN 3.3 (L) 07/23/2016 0858   AST 14 07/23/2016 0858   ALT <9 07/23/2016 0858   ALKPHOS 107 07/23/2016 0858   BILITOT 0.63 07/23/2016 0858   GFRNONAA 55 (L) 04/14/2016 0022   GFRAA >60 04/14/2016 0022    No results found for: SPEP, UPEP  Lab Results  Component Value Date   WBC 5.7 07/23/2016   NEUTROABS 4.2 07/23/2016   HGB 11.3 (L) 07/23/2016   HCT 35.2 (L) 07/23/2016   MCV 84.2 07/23/2016   PLT 201 07/23/2016      Chemistry      Component Value Date/Time   NA 142 07/23/2016 0858   K 4.4  07/23/2016 0858   CL 105 04/14/2016 0022   CO2 25 07/23/2016 0858   BUN 20.9 07/23/2016 0858   CREATININE 1.4 (H) 07/23/2016 0858      Component Value Date/Time   CALCIUM 9.6 07/23/2016 0858   ALKPHOS 107 07/23/2016 0858   AST 14 07/23/2016 0858   ALT <9 07/23/2016 0858   BILITOT 0.63 07/23/2016 0858      ASSESSMENT & PLAN:  Cancer of base of tongue ([place]) He tolerated radiation therapy well. However, he has poorly controlled pain. We discussed pain management and future care plan. The patient desire further systemic treatment and feels like if he stops treatment now, he would be "giving up". We discussed briefly the rationale for palliative treatment and the high risk, low benefit ratio of future treatment. I will bring him back in 2 weeks to discuss further treatment options with him and his family members  Cancer associated pain We discussed pain management. I will plan to increase methadone to 20 mg every 8 hours and to keep the same dose of Dilaudid at 4 mg every 4 hours when necessary for breakthrough pain. We discussed potential increased side effects such as sedation and constipation. I will reassess pain management in 2 weeks We discussed narcotic refill policy.   No orders of the defined types were placed in this encounter.  All questions were answered. The patient knows to call the clinic with any problems, questions or concerns. No barriers to learning was detected. I spent 15 minutes counseling the patient face to face. The total time spent in the appointment was 20 minutes and more than 50% was on counseling and review of test results     [facility], [place], MD 08/14/2016 2:14 PM

## 2016-08-15 ENCOUNTER — Ambulatory Visit
Admission: RE | Admit: 2016-08-15 | Discharge: 2016-08-15 | Disposition: A | Discharge status: Home / Self Care | Payer: PPO | Source: Ambulatory Visit | Attending: Radiation Oncology | Admitting: Radiation Oncology

## 2016-08-15 ENCOUNTER — Ambulatory Visit: Payer: PPO | Admitting: Physical Therapy

## 2016-08-15 DIAGNOSIS — Z510 Encounter for antineoplastic radiation therapy: Secondary | ICD-10-CM | POA: Diagnosis not present

## 2016-08-18 ENCOUNTER — Encounter: Payer: Self-pay | Admitting: Radiation Oncology

## 2016-08-18 ENCOUNTER — Ambulatory Visit: Payer: Self-pay | Admitting: Hematology and Oncology

## 2016-08-18 ENCOUNTER — Ambulatory Visit
Admission: RE | Admit: 2016-08-18 | Discharge: 2016-08-18 | Disposition: A | Discharge status: Home / Self Care | Payer: PPO | Source: Ambulatory Visit | Attending: Radiation Oncology | Admitting: Radiation Oncology

## 2016-08-18 ENCOUNTER — Encounter (HOSPITAL_COMMUNITY): Payer: Self-pay | Admitting: Dentistry

## 2016-08-18 ENCOUNTER — Ambulatory Visit: Admission: RE | Admit: 2016-08-18 | Payer: PPO | Source: Ambulatory Visit

## 2016-08-18 VITALS — BP 123/73 | HR 65 | Temp 98.2°F | Ht 72.0 in | Wt 208.7 lb

## 2016-08-18 DIAGNOSIS — C7951 Secondary malignant neoplasm of bone: Secondary | ICD-10-CM

## 2016-08-18 DIAGNOSIS — Z510 Encounter for antineoplastic radiation therapy: Secondary | ICD-10-CM | POA: Diagnosis not present

## 2016-08-18 MED ORDER — LIDOCAINE VISCOUS 2 % MT SOLN: OROMUCOSAL | 5 refills | Status: AC

## 2016-08-18 NOTE — Progress Notes (Signed)
Mr. Vanous presents for his 8th fraction of radiation to his T spine and LS spine. He reports pain a 3/10 to his Right side and Left ear. He reports increased pain relief with the increase of his methadone to 20 mg three times daily. He is taking his breakthrough dilaudid about every 6 hours. He tells me he believes he is taking less dilaudid daily. He does also report that his throat hurts when he swallow especially first thing in the morning. He would like a prescription for lidocaine rinses which he has tried in the past with success. He is eating well per his report, but does report a decreased appetite. He tries to drink Boost during the day, but finds it difficult because of his sore throat. He is having daily bowel movements, and takes miralax daily.  BP 123/73   Pulse 65   Temp 98.2 F (36.8 C)   Ht 6' (1.829 m)   Wt 208 lb 11.2 oz (94.7 kg)   SpO2 100% Comment: room air  BMI 28.30 kg/m    Wt Readings from Last 3 Encounters:  08/18/16 208 lb 11.2 oz (94.7 kg)  08/14/16 207 lb 3.2 oz (94 kg)  08/13/16 207 lb 3.2 oz (94 kg)

## 2016-08-18 NOTE — Progress Notes (Signed)
   Weekly Management Note:  outpatient    ICD-9-CM ICD-10-CM   1. Bone metastases (HCC) 198.5 C79.51 lidocaine (XYLOCAINE) 2 % solution    Current Dose:  24 Gy  Projected Dose: 30 Gy   Narrative:  The patient presents for routine under treatment assessment.  CBCT/MVCT images/Port film x-rays were reviewed.  The chart was checked.  Sore throat.  Pain in back is better.  Occasional nausea , no diarrhea.   Physical Findings:  Wt Readings from Last 3 Encounters:  08/18/16 208 lb 11.2 oz (94.7 kg)  08/14/16 207 lb 3.2 oz (94 kg)  08/13/16 207 lb 3.2 oz (94 kg)    height is 6' (1.829 m) and weight is 208 lb 11.2 oz (94.7 kg). His temperature is 98.2 F (36.8 C). His blood pressure is 123/73 and his pulse is 65. His oxygen saturation is 100%.   No oral thrush  CBC    Component Value Date/Time   WBC 5.7 07/23/2016 0858   WBC 9.2 04/14/2016 0022   RBC 4.18 (L) 07/23/2016 0858   RBC 4.66 04/14/2016 0022   HGB 11.3 (L) 07/23/2016 0858   HCT 35.2 (L) 07/23/2016 0858   PLT 201 07/23/2016 0858   MCV 84.2 07/23/2016 0858   MCH 27.0 (L) 07/23/2016 0858   MCH 28.1 04/14/2016 0022   MCHC 32.1 07/23/2016 0858   MCHC 33.0 04/14/2016 0022   RDW 16.4 (H) 07/23/2016 0858   LYMPHSABS 0.5 (L) 07/23/2016 0858   MONOABS 0.9 07/23/2016 0858   EOSABS 0.0 07/23/2016 0858   BASOSABS 0.0 07/23/2016 0858     CMP     Component Value Date/Time   NA 142 07/23/2016 0858   K 4.4 07/23/2016 0858   CL 105 04/14/2016 0022   CO2 25 07/23/2016 0858   GLUCOSE 104 07/23/2016 0858   BUN 20.9 07/23/2016 0858   CREATININE 1.4 (H) 07/23/2016 0858   CALCIUM 9.6 07/23/2016 0858   PROT 6.3 (L) 07/23/2016 0858   ALBUMIN 3.3 (L) 07/23/2016 0858   AST 14 07/23/2016 0858   ALT <9 07/23/2016 0858   ALKPHOS 107 07/23/2016 0858   BILITOT 0.63 07/23/2016 0858   GFRNONAA 55 (L) 04/14/2016 0022   GFRAA >60 04/14/2016 0022     Impression:  The patient is tolerating radiotherapy.   Plan:  Continue  radiotherapy as planned.   PT to neck edema  Pain - tolerating increased pain meds per med onc. Pt thinks RT is helping too.  Take PPI, plus PRN Tums for heart burn.  Take lidocaine for sore throat.  Antiemetics PRN  F/u 15mo -----------------------------------  Eppie Gibson, MD

## 2016-08-19 ENCOUNTER — Ambulatory Visit
Admission: RE | Admit: 2016-08-19 | Discharge: 2016-08-19 | Disposition: A | Discharge status: Home / Self Care | Payer: PPO | Source: Ambulatory Visit | Attending: Radiation Oncology | Admitting: Radiation Oncology

## 2016-08-19 ENCOUNTER — Ambulatory Visit: Payer: PPO | Attending: Radiation Oncology | Admitting: Physical Therapy

## 2016-08-19 DIAGNOSIS — R293 Abnormal posture: Secondary | ICD-10-CM | POA: Diagnosis present

## 2016-08-19 DIAGNOSIS — I890 Lymphedema, not elsewhere classified: Secondary | ICD-10-CM

## 2016-08-19 DIAGNOSIS — Z510 Encounter for antineoplastic radiation therapy: Secondary | ICD-10-CM | POA: Diagnosis not present

## 2016-08-19 NOTE — Therapy (Signed)
[place], [place], [address] Phone: [phone]   Fax:  [phone]  Physical Therapy Evaluation  Patient Details  Name: Shane Cantu MRN: [id number] Date of Birth: 1956/02/04 Referring Provider: Eppie Gibson, MD  Encounter Date: 08/19/2016      PT End of Session - 08/19/16 1520    Visit Number 1   Number of Visits 9   Date for PT Re-Evaluation 09/19/16   PT Start Time [id number]   PT Stop Time 1505   PT Time Calculation (min) 31 min   Activity Tolerance Patient tolerated treatment well   Behavior During Therapy [facility] for tasks assessed/performed      Past Medical History:  Diagnosis Date  . Allergic rhinitis   . Arthritis   . Back pain   . Bacterial conjunctivitis of left eye 07/26/2015  . Cancer ([place])     may 2016 tongue cancer  . Chronic fatigue 01/11/2016  . Chronic neck pain   . Chronic pain   . GERD (gastroesophageal reflux disease)   . History of kidney stones   . Hypertension   . Migraine headache without aura   . Neuropathy ([place])   . Pharyngitis, acute 09/06/2015  . Radiation 05/11/15-05/23/15   T1-T3 and Left 3rd posterior rib, Base of tongue, neck and Cspine  . Radiation 04/14/16-04/25/16   right anterior 5th rib, left posterior neck  . Sleep apnea    does not use cpap    Past Surgical History:  Procedure Laterality Date  . BACK SURGERY  03/1993, 10/2002   Dr. Louanne Skye  . COLONOSCOPY    . LYMPH NODE BIOPSY    . MULTIPLE EXTRACTIONS WITH ALVEOLOPLASTY N/A 04/26/2015   Procedure: Extraction of tooth #'s 6,11,12,15,20,21,22,27,28 with alveoloplasty;  Surgeon: [name], DDS;  Location: [facility];  Service: Oral Surgery;  Laterality: N/A;  . Nasal sinusotomy  1977  . NECK SURGERY  2004   Dr. Louanne Skye  . RADIOLOGY WITH ANESTHESIA N/A 08/09/2015   Procedure: MRI LUMBAR WITH/WITHOUT CONTRAST, CERVICAL WITH CONTRAST  (RADIOLOGY WITH ANESTHESIA);  Surgeon: Medication Radiologist, MD;  Location:  [facility];  Service: Radiology;  Laterality: N/A;  . TONSILLECTOMY     as a child    There were no vitals filed for this visit.       Subjective Assessment - 08/19/16 1438    Subjective Patient states he has cancer in his neck and his lymph nodes are all swollen.    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  Pt has also received chemotherapy. Tomorrow is his last day of radiation and he still has to decide if he wants to do more chemotherapy.    Patient Stated Goals to get swelling in neck down   Currently in Pain? Yes   Pain Score 4    Pain Location Ear   Pain Orientation Left   Pain Descriptors / Indicators Constant   Pain Type Chronic pain   Pain Onset More than a month ago   Pain Frequency Constant   Aggravating Factors  nothing   Pain Relieving Factors medication   Multiple Pain Sites Yes   Pain Score 7   Pain Location Other (Comment)  trunk   Pain Orientation Right  Pain Descriptors / Indicators Constant   Pain Type Chronic pain   Aggravating Factors  moving around   Pain Relieving Factors medication, laying down, getting pressure off of it            [facility] PT Assessment - 08/19/16 0001      Assessment   Medical Diagnosis base on tongue cancer   Referring Provider Eppie Gibson, MD   Onset Date/Surgical Date 04/01/15   Hand Dominance Right     Precautions   Precautions Other (comment)  lymphedema     Restrictions   Weight Bearing Restrictions No     Balance Screen   Has the patient fallen in the past 6 months No   Has the patient had a decrease in activity level because of a fear of falling?  No   Is the patient reluctant to leave their home because of a fear of falling?  No     Home Environment   Living Environment Private residence    Living Arrangements Children  daughter   Available Help at Discharge Family   Type of [place] to enter   Entrance Stairs-Number of Steps 3   Entrance Stairs-Rails Can reach both   [place] Two level;Able to live on main level with bedroom/bathroom   Alternate Level Stairs-Number of Steps 12   Alternate Level Stairs-Rails Can reach both   [place] - 4 wheels;Cane - single point;Crutches;Bedside commode;Shower seat;Tub bench;Toilet riser;Hand held shower head;Wheelchair - manual     Prior Function   Level of Independence Independent   Vocation On disability   Leisure works on Psychiatric nurse Status Within Functional Limits for tasks assessed     Observation/Other Assessments   Observations noticeable swelling on left side of neck and face   Other Surveys  --  LLIS: 57% impairment     Posture/Postural Control   Posture Comments forward head and rounder shoulders     Palpation   Palpation comment firm nodule under mandible on left side of neck           LYMPHEDEMA/ONCOLOGY QUESTIONNAIRE - 08/19/16 1446      Type   Cancer Type base of tongue cancer     What other symptoms do you have   Are you Having Heaviness or Tightness Yes   Are you having Pain Yes     Head and Neck   4 cm superior to sternal notch around neck 45.2 cm   6 cm superior to sternal notch around neck 45.8 cm   8 cm superior to sternal notch around neck 47 cm                [facility] Adult PT Treatment/Exercise - 08/19/16 0001      Manual Therapy   Manual Therapy Manual Lymphatic Drainage (MLD);Edema management   Edema Management provided patient with a chip pack   Manual Lymphatic Drainage (MLD) In recline on elevated mat: supraclavicular fossae, bilateral axillae, bilateral shoulder collectors; across chest; posterolateral, lateral, anteriolateral, and anterior neck                PT Education - 08/19/16 1520     Education provided Yes   Education Details Instructed on wearing chip pack while at home for compression at neck   Person(s) Educated Patient   Methods Explanation;Demonstration   Comprehension Verbalized understanding  [facility] Goals - 08/19/16 1529      CC Long Term Goal  #1   Title Patient will be knowledgeable in how to obtain and wear compression garments.   Time 4   Period Weeks   Status New     CC Long Term Goal  #2   Title Reduce circumference measurement at 8 cm superior to sternal notch to 46 cm or less.   Time 4   Period Weeks   Status New     CC Long Term Goal  #3   Title Pt will be independent in self manual lymphatic drainage technique for long term management of edema   Time 4   Period Weeks   Status New            Plan - 08/19/16 1522    Clinical Impression Statement Patient was previously evaluated at this clinic on 05/13/16 for pain and lymphedema in his neck, but did not return for any treatment sessions. He now presents with increaed swelling in his neck, particularly on the left side, compared to his first evaluation. He has a palpable firm nodule under his mandible on the left side. Therapist began some manual lymph drainage today and provided patient with a chip pack for wearing at home.   Rehab Potential Fair   Clinical Impairments Affecting Rehab Potential current radiation (completes 08/20/16), bony metastases   PT Frequency 2x / week   PT Duration 4 weeks   PT Treatment/Interventions Manual lymph drainage;Manual techniques;Therapeutic exercise;Vasopneumatic Device;Taping;Passive range of motion;DME Instruction;ADLs/Self Care Home Management;Patient/family education   PT Next Visit Plan continue MLD to neck, provide patient with information about Flexitouch, educate patient on self MLD   Consulted and Agree with Plan of Care Patient      Patient will benefit from skilled therapeutic intervention in order to improve  the following deficits and impairments:  Pain, Increased edema  Visit Diagnosis: Lymphedema, not elsewhere classified  Abnormal posture     Problem List Patient Active Problem List   Diagnosis Date Noted  . Palliative care by specialist   . DNR (do not resuscitate) discussion   . Localized swelling of both lower legs 07/04/2016  . Lymphedema of face 07/04/2016  . Bone metastases ([place]) 04/02/2016  . Palliative care encounter 03/25/2016  . Elevated serum creatinine 02/12/2016  . 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 Metastasis to bone ([place]) 01/15/2016  . Chronic fatigue 01/11/2016  . Acute frontal sinusitis 12/12/2015  . Pharyngitis, acute 09/06/2015  . Right wrist pain   . OSA treated with BiPAP   . Metastatic cancer to bone ([place])   . Diabetes type 2, controlled ([place])   . Acute respiratory failure with hypoxia and hypercarbia (HCC)   . OSA on CPAP   . Chronic neck and back pain   . Chronic diastolic congestive heart failure ([place])   . Anemia due to chemotherapy   . Hyperglycemia   . Hypokalemia   . Acute dyspnea   . Infection due to portacath   . Screen for STD (sexually transmitted disease)   . HCAP (healthcare-associated pneumonia) 08/04/2015  . Sepsis ([place]) 08/04/2015  . Acute respiratory failure with hypoxia ([place]) 08/04/2015  . Elevated troponin 08/04/2015  . Acute hyperglycemia 08/04/2015  . RBBB 08/04/2015  . Sepsis due to pneumonia ([place]) 08/04/2015  . OSA (obstructive sleep apnea) 08/04/2015  . Closed right ankle fracture 08/04/2015  . HTN (hypertension) 08/04/2015  . QT prolongation 08/04/2015  . Bacterial  conjunctivitis of left eye 07/26/2015  . Nausea   . Dehydration 07/21/2015  . Hypomagnesemia 07/20/2015  . Cancer associated pain 07/20/2015  . Chemotherapy-induced nausea 07/13/2015  . Anemia due to chronic illness 06/21/2015  . Leukopenia due to antineoplastic chemotherapy 06/21/2015  . Mucositis due to chemotherapy 06/08/2015   . Acneiform drug eruption 06/08/2015  . Fever 06/04/2015  . Syncope, vasovagal 06/04/2015  . Coagulopathy ([place]) 06/04/2015  . Fibula fracture 06/04/2015  . Vasovagal syncope   . Rib pain on right side   . Neck swelling 05/09/2015  . Cancer of base of tongue ([place]) 04/18/2015  . Chronic neck pain 04/18/2015    Mellody Life 08/19/2016, 3:34 PM  [place], [place], [address] Phone: [phone]   Fax:  [phone]  Name: Shane Cantu MRN: [id number] Date of Birth: May 16, 1956   Saverio Danker, SPT

## 2016-08-20 ENCOUNTER — Ambulatory Visit
Admission: RE | Admit: 2016-08-20 | Discharge: 2016-08-20 | Disposition: A | Discharge status: Home / Self Care | Payer: PPO | Source: Ambulatory Visit | Attending: Radiation Oncology | Admitting: Radiation Oncology

## 2016-08-20 ENCOUNTER — Encounter: Payer: Self-pay | Admitting: Radiation Oncology

## 2016-08-20 DIAGNOSIS — Z510 Encounter for antineoplastic radiation therapy: Secondary | ICD-10-CM | POA: Diagnosis not present

## 2016-08-25 ENCOUNTER — Ambulatory Visit: Payer: Self-pay | Admitting: Hematology and Oncology

## 2016-08-26 ENCOUNTER — Encounter: Payer: Self-pay | Admitting: Physical Therapy

## 2016-08-26 ENCOUNTER — Ambulatory Visit: Payer: PPO | Admitting: Physical Therapy

## 2016-08-26 DIAGNOSIS — I890 Lymphedema, not elsewhere classified: Secondary | ICD-10-CM

## 2016-08-26 NOTE — Therapy (Signed)
[place], [place], [address] Phone: [phone]   Fax:  [phone]  Physical Therapy Treatment  Patient Details  Name: Shane Cantu MRN: [id number] Date of Birth: Jun 03, 1956 Referring Provider: Eppie Gibson, MD  Encounter Date: 08/26/2016      PT End of Session - 08/26/16 1702    Visit Number 2   Number of Visits 9   Date for PT Re-Evaluation 09/19/16   PT Start Time 1604   PT Stop Time 1649   PT Time Calculation (min) 45 min   Activity Tolerance Patient tolerated treatment well   Behavior During Therapy [facility] for tasks assessed/performed      Past Medical History:  Diagnosis Date  . Allergic rhinitis   . Arthritis   . Back pain   . Bacterial conjunctivitis of left eye 07/26/2015  . Cancer ([place])     may 2016 tongue cancer  . Chronic fatigue 01/11/2016  . Chronic neck pain   . Chronic pain   . GERD (gastroesophageal reflux disease)   . History of kidney stones   . Hypertension   . Migraine headache without aura   . Neuropathy ([place])   . Pharyngitis, acute 09/06/2015  . Radiation 05/11/15-05/23/15   T1-T3 and Left 3rd posterior rib, Base of tongue, neck and Cspine  . Radiation 04/14/16-04/25/16   right anterior 5th rib, left posterior neck  . Sleep apnea    does not use cpap    Past Surgical History:  Procedure Laterality Date  . BACK SURGERY  03/1993, 10/2002   Dr. Louanne Skye  . COLONOSCOPY    . LYMPH NODE BIOPSY    . MULTIPLE EXTRACTIONS WITH ALVEOLOPLASTY N/A 04/26/2015   Procedure: Extraction of tooth #'s 6,11,12,15,20,21,22,27,28 with alveoloplasty;  Surgeon: [name], DDS;  Location: [place];  Service: Oral Surgery;  Laterality: N/A;  . Nasal sinusotomy  1977  . NECK SURGERY  2004   Dr. Louanne Skye  . RADIOLOGY WITH ANESTHESIA N/A 08/09/2015   Procedure: MRI LUMBAR WITH/WITHOUT CONTRAST, CERVICAL WITH CONTRAST  (RADIOLOGY WITH ANESTHESIA);  Surgeon: Medication Radiologist, MD;  Location:  [place];  Service: Radiology;  Laterality: N/A;  . TONSILLECTOMY     as a child    There were no vitals filed for this visit.      Subjective Assessment - 08/26/16 1606    Subjective I feel like my swelling is about the same. My lower back is hurting from the radiation.    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  Pt has also received chemotherapy. Tomorrow is his last day of radiation and he still has to decide if he wants to do more chemotherapy.    Patient Stated Goals to get swelling in neck down   Currently in Pain? Yes   Pain Score 7    Pain Location Back   Pain Orientation Lower   Pain Descriptors / Indicators Constant;Sharp   Pain Type Chronic pain   Pain Onset More than a month ago   Pain Frequency Constant   Pain Score 5   Pain Location --  right side   Pain Orientation Right   Pain Descriptors / Indicators Constant;Sharp   Pain Type Chronic pain  [place] Adult PT Treatment/Exercise - 08/26/16 0001      Manual Therapy   Manual Therapy Manual Lymphatic Drainage (MLD)   Manual Lymphatic Drainage (MLD) In supine with head elevated, bilateral axilla, short neck, bilateral shoulder collectors, lateral neck working distally, anterior neck moving fluid towards lateral neck, anterior face, cheek and jaw moving fluid up and around ear then down lateral neck, ended with bilateral shoulder collectors and bilateral axilla - gave pt very simple handout regarding correct [place] to perform technique and educated pt verbally throughout the process                        [place] - 08/19/16 1529      CC Long Term Goal  #1   Title Patient will be knowledgeable in how to obtain and wear compression garments.    Time 4   Period Weeks   Status New     CC Long Term Goal  #2   Title Reduce circumference measurement at 8 cm superior to sternal notch to 46 cm or less.   Time 4   Period Weeks   Status New     CC Long Term Goal  #3   Title Pt will be independent in self manual lymphatic drainage technique for long term management of edema   Time 4   Period Weeks   Status New            Plan - 08/26/16 1703    Clinical Impression Statement Educated patient in correct [place] to perform MLD today. Gave pt very basic instructions for performing MLD technique at home. Will instruct pt's daughter in correct technique next session. Pt states he has difficulty remembering things and following directions due to his medications and pain. He is very interested in the flexitouch and was given a handout regarding this today.    Rehab Potential Fair   Clinical Impairments Affecting Rehab Potential current radiation (completes 08/20/16), bony metastases   PT Frequency 2x / week   PT Duration 4 weeks   PT Treatment/Interventions Manual lymph drainage;Manual techniques;Therapeutic exercise;Vasopneumatic Device;Taping;Passive range of motion;DME Instruction;ADLs/Self Care Home Management;Patient/family education   PT Next Visit Plan continue MLD to neck, give pt more detailed handout on MLD, instruct daughter in technique   Recommended Other Services facesheet sent to [place] and Agree with Plan of Care Patient      Patient will benefit from skilled therapeutic intervention in order to improve the following deficits and impairments:  Pain, Increased edema  Visit Diagnosis: Lymphedema, not elsewhere classified     Problem List Patient Active Problem List   Diagnosis Date Noted  . Palliative care by specialist   . DNR (do not resuscitate) discussion   . Localized swelling of both lower legs 07/04/2016  . Lymphedema of face 07/04/2016  . Bone metastases ([place]) 04/02/2016  . Palliative  care encounter 03/25/2016  . Elevated serum creatinine 02/12/2016  . 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 Metastasis to bone ([place]) 01/15/2016  . Chronic fatigue 01/11/2016  . Acute frontal sinusitis 12/12/2015  . Pharyngitis, acute 09/06/2015  . Right wrist pain   . OSA treated with BiPAP   . Metastatic cancer to bone ([place])   . Diabetes type 2, controlled ([place])   . Acute respiratory failure with hypoxia and hypercarbia (HCC)   . OSA on CPAP   . Chronic neck and back pain   . Chronic diastolic  congestive heart failure ([place])   . Anemia due to chemotherapy   . Hyperglycemia   . Hypokalemia   . Acute dyspnea   . Infection due to portacath   . Screen for STD (sexually transmitted disease)   . HCAP (healthcare-associated pneumonia) 08/04/2015  . Sepsis ([place]) 08/04/2015  . Acute respiratory failure with hypoxia ([place]) 08/04/2015  . Elevated troponin 08/04/2015  . Acute hyperglycemia 08/04/2015  . RBBB 08/04/2015  . Sepsis due to pneumonia ([place]) 08/04/2015  . OSA (obstructive sleep apnea) 08/04/2015  . Closed right ankle fracture 08/04/2015  . HTN (hypertension) 08/04/2015  . QT prolongation 08/04/2015  . Bacterial conjunctivitis of left eye 07/26/2015  . Nausea   . Dehydration 07/21/2015  . Hypomagnesemia 07/20/2015  . Cancer associated pain 07/20/2015  . Chemotherapy-induced nausea 07/13/2015  . Anemia due to chronic illness 06/21/2015  . Leukopenia due to antineoplastic chemotherapy 06/21/2015  . Mucositis due to chemotherapy 06/08/2015  . Acneiform drug eruption 06/08/2015  . Fever 06/04/2015  . Syncope, vasovagal 06/04/2015  . Coagulopathy ([place]) 06/04/2015  . Fibula fracture 06/04/2015  . Vasovagal syncope   . Rib pain on right side   . Neck swelling 05/09/2015  . Cancer of base of tongue ([place]) 04/18/2015  . Chronic neck pain 04/18/2015    Alexia Freestone 08/26/2016, 5:06 PM  [place], [place], [address] Phone: [phone]   Fax:  [phone]  Name: Shane Cantu MRN: [id number] Date of Birth: 1956/06/01  Allyson Sabal, PT 08/26/16 5:06 PM

## 2016-08-26 NOTE — Patient Instructions (Signed)
Manual Lymph Drainage for face/neck   1) Place hands on areas just above collar bones and do 15-20 stationary circles 2) Place hands on either side of neck and do 15-20 stationary circles  3) Do stationary circles at right armpit area (about 10 times) and the left armpit (about 10 times) 4) Standing at the head of the bed, do stationary circles on each side of the face at the jaw line (15-20) 5) Standing at the head of the bed, do stationary circles on each side of the face on the cheeks (15-20) 6) Standing at the head of the bed, do stationary circles on each side of the face between the eyes and ears (15-20) 7) Repeat step 2 8) Repeat step 1     Do not slide on the skin Only give enough pressure to stretch the skin Make sure to always wash your hands prior to massage 

## 2016-08-28 ENCOUNTER — Ambulatory Visit: Payer: PPO | Admitting: Physical Therapy

## 2016-08-28 ENCOUNTER — Encounter: Payer: Self-pay | Admitting: Physical Therapy

## 2016-08-28 DIAGNOSIS — I890 Lymphedema, not elsewhere classified: Secondary | ICD-10-CM

## 2016-08-28 NOTE — Therapy (Addendum)
[place], [place], [address] Phone: [phone]   Fax:  [phone]  Physical Therapy Treatment  Patient Details  Name: Shane Cantu MRN: [id number] Date of Birth: 1956/03/31 Referring Provider: Eppie Gibson, MD  Encounter Date: 08/28/2016      PT End of Session - 08/28/16 1709    Visit Number 3   Number of Visits 9   Date for PT Re-Evaluation 09/19/16   PT Start Time 1520   PT Stop Time 1602   PT Time Calculation (min) 42 min   Activity Tolerance Patient tolerated treatment well   Behavior During Therapy [facility] for tasks assessed/performed      Past Medical History:  Diagnosis Date  . Allergic rhinitis   . Arthritis   . Back pain   . Bacterial conjunctivitis of left eye 07/26/2015  . Cancer ([place])     may 2016 tongue cancer  . Chronic fatigue 01/11/2016  . Chronic neck pain   . Chronic pain   . GERD (gastroesophageal reflux disease)   . History of kidney stones   . Hypertension   . Migraine headache without aura   . Neuropathy ([employer])   . Pharyngitis, acute 09/06/2015  . Radiation 05/11/15-05/23/15   T1-T3 and Left 3rd posterior rib, Base of tongue, neck and Cspine  . Radiation 04/14/16-04/25/16   right anterior 5th rib, left posterior neck  . Sleep apnea    does not use cpap    Past Surgical History:  Procedure Laterality Date  . BACK SURGERY  03/1993, 10/2002   Dr. Louanne Skye  . COLONOSCOPY    . LYMPH NODE BIOPSY    . MULTIPLE EXTRACTIONS WITH ALVEOLOPLASTY N/A 04/26/2015   Procedure: Extraction of tooth #'s 6,11,12,15,20,21,22,27,28 with alveoloplasty;  Surgeon: [name], DDS;  Location: [place];  Service: Oral Surgery;  Laterality: N/A;  . Nasal sinusotomy  1977  . NECK SURGERY  2004   Dr. Louanne Skye  . RADIOLOGY WITH ANESTHESIA N/A 08/09/2015   Procedure: MRI LUMBAR WITH/WITHOUT CONTRAST, CERVICAL WITH CONTRAST  (RADIOLOGY WITH ANESTHESIA);  Surgeon: Medication Radiologist, MD;  Location:  [place];  Service: Radiology;  Laterality: N/A;  . TONSILLECTOMY     as a child    There were no vitals filed for this visit.      Subjective Assessment - 08/28/16 1524    Subjective I could tell a big difference after I came the other day.    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  Pt has also received chemotherapy. Tomorrow is his last day of radiation and he still has to decide if he wants to do more chemotherapy.    Patient Stated Goals to get swelling in neck down   Currently in Pain? Yes   Pain Score 7    Pain Location Back   Pain Orientation Lower   Pain Descriptors / Indicators Constant;Sharp   Pain Type Chronic pain   Pain Onset More than a month ago   Pain Frequency Constant   Pain Score 7   Pain Location --  right trunk   Pain Orientation Right   Pain Descriptors / Indicators Sharp;Constant   Pain Type Chronic pain   Pain  Frequency Constant                         [facility] Adult PT Treatment/Exercise - 08/28/16 0001      Manual Therapy   Manual Therapy Manual Lymphatic Drainage (MLD)   Manual Lymphatic Drainage (MLD) Gave pt's daughter detailed instructions and demonstrated the following: In supine with head elevated, bilateral axilla, short neck, bilateral shoulder collectors, lateral neck working distally, anterior neck moving fluid towards lateral neck, anterior face, cheek and jaw moving fluid up and around ear then down lateral neck, ended with bilateral shoulder collectors and bilateral axilla -- had pt's daughter return demonstrate correct technique with moderate verbal and tactile cues                        Long Term Clinic Goals - 08/28/16 1708      CC Long Term Goal   #1   Title Patient will be knowledgeable in how to obtain and wear compression garments.   Time 4   Period Weeks   Status On-going     CC Long Term Goal  #2   Title Reduce circumference measurement at 8 cm superior to sternal notch to 46 cm or less.   Time 4   Period Weeks   Status On-going     CC Long Term Goal  #3   Title Pt will be independent in self manual lymphatic drainage technique for long term management of edema   Baseline 9/28- pt's daughter instructed in correct technique and given detailed handout since she lives with pt   Time 4   Period Weeks   Status On-going     CC Long Term Goal  #4   Title Pt will report decreased max pain to 4/10 to allow improved comfort   Time 4   Period Weeks   Status On-going            Plan - 08/28/16 1709    Clinical Impression Statement Gave pt's daughter detailed instructions on correct [place] to perform MLD to pt by giving patient a handout and demonstrating technique to patient. Had pt's daughter return demonstrate which she was able to do the entire technique with moderate verbal and tactile cues.   Rehab Potential Fair   Clinical Impairments Affecting Rehab Potential current radiation (completes 08/20/16), bony metastases   PT Frequency 2x / week   PT Duration 4 weeks   PT Treatment/Interventions Manual lymph drainage;Manual techniques;Therapeutic exercise;Vasopneumatic Device;Taping;Passive range of motion;DME Instruction;ADLs/Self Care Home Management;Patient/family education   PT Next Visit Plan assess pt's daughter for indep with MLD, educated pt on compression garments   Consulted and Agree with Plan of Care Patient      Patient will benefit from skilled therapeutic intervention in order to improve the following deficits and impairments:  Pain, Increased edema  Visit Diagnosis: Lymphedema, not elsewhere classified     Problem List Patient Active Problem List   Diagnosis Date Noted  . Palliative care by specialist    . DNR (do not resuscitate) discussion   . Localized swelling of both lower legs 07/04/2016  . Lymphedema of face 07/04/2016  . Bone metastases ([place]) 04/02/2016  . Palliative care encounter 03/25/2016  . Elevated serum creatinine 02/12/2016  . 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 Metastasis to bone ([place]) 01/15/2016  . Chronic fatigue 01/11/2016  . Acute frontal sinusitis 12/12/2015  . Pharyngitis, acute 09/06/2015  .  Right wrist pain   . OSA treated with BiPAP   . Metastatic cancer to bone ([place])   . Diabetes type 2, controlled ([place])   . Acute respiratory failure with hypoxia and hypercarbia (HCC)   . OSA on CPAP   . Chronic neck and back pain   . Chronic diastolic congestive heart failure ([place])   . Anemia due to chemotherapy   . Hyperglycemia   . Hypokalemia   . Acute dyspnea   . Infection due to portacath   . Screen for STD (sexually transmitted disease)   . HCAP (healthcare-associated pneumonia) 08/04/2015  . Sepsis ([place]) 08/04/2015  . Acute respiratory failure with hypoxia ([place]) 08/04/2015  . Elevated troponin 08/04/2015  . Acute hyperglycemia 08/04/2015  . RBBB 08/04/2015  . Sepsis due to pneumonia ([place]) 08/04/2015  . OSA (obstructive sleep apnea) 08/04/2015  . Closed right ankle fracture 08/04/2015  . HTN (hypertension) 08/04/2015  . QT prolongation 08/04/2015  . Bacterial conjunctivitis of left eye 07/26/2015  . Nausea   . Dehydration 07/21/2015  . Hypomagnesemia 07/20/2015  . Cancer associated pain 07/20/2015  . Chemotherapy-induced nausea 07/13/2015  . Anemia due to chronic illness 06/21/2015  . Leukopenia due to antineoplastic chemotherapy 06/21/2015  . Mucositis due to chemotherapy 06/08/2015  . Acneiform drug eruption 06/08/2015  . Fever 06/04/2015  . Syncope, vasovagal 06/04/2015  . Coagulopathy ([place]) 06/04/2015  . Fibula fracture 06/04/2015  . Vasovagal syncope   . Rib pain on right side   . Neck swelling 05/09/2015  . Cancer  of base of tongue ([place]) 04/18/2015  . Chronic neck pain 04/18/2015    Alexia Freestone 08/28/2016, 5:16 PM  [place], [place], [address] Phone: [phone]   Fax:  [phone]  Name: Shane Cantu MRN: [id number] Date of Birth: 1956/04/28  Allyson Sabal, PT 08/28/16 5:18 PM  PHYSICAL THERAPY DISCHARGE SUMMARY  Visits from Start of Care: 3  Current functional level related to goals / functional outcomes: Instructed pt's daughter how to do manual lymphatic drainage and she was able to return demonstrate   Remaining deficits: Pt never obtained compression garments   Education / Equipment: Self MLD Plan: Patient agrees to discharge.  Patient goals were not met. Patient is being discharged due to not returning since the last visit.  ?????     Allyson Sabal, PT 10/20/16 11:57 AM

## 2016-08-29 ENCOUNTER — Ambulatory Visit (HOSPITAL_BASED_OUTPATIENT_CLINIC_OR_DEPARTMENT_OTHER): Payer: PPO | Admitting: Hematology and Oncology

## 2016-08-29 DIAGNOSIS — G893 Neoplasm related pain (acute) (chronic): Secondary | ICD-10-CM

## 2016-08-29 DIAGNOSIS — C01 Malignant neoplasm of base of tongue: Secondary | ICD-10-CM

## 2016-08-29 DIAGNOSIS — C7951 Secondary malignant neoplasm of bone: Secondary | ICD-10-CM | POA: Diagnosis not present

## 2016-08-29 DIAGNOSIS — C770 Secondary and unspecified malignant neoplasm of lymph nodes of head, face and neck: Secondary | ICD-10-CM

## 2016-08-29 DIAGNOSIS — Z23 Encounter for immunization: Secondary | ICD-10-CM | POA: Diagnosis not present

## 2016-08-29 DIAGNOSIS — Z7189 Other specified counseling: Secondary | ICD-10-CM

## 2016-08-29 MED ORDER — HYDROMORPHONE HCL 8 MG PO TABS: 8.0000 mg | ORAL_TABLET | Freq: Four times a day (QID) | ORAL | 0 refills | Status: DC | PRN

## 2016-08-29 MED ORDER — INFLUENZA VAC SPLIT QUAD 0.5 ML IM SUSY
0.5000 mL | PREFILLED_SYRINGE | Freq: Once | INTRAMUSCULAR | Status: AC
  Administered 2016-08-29: 0.5 mL via INTRAMUSCULAR
  Filled 2016-08-29: qty 0.5

## 2016-08-29 MED ORDER — LANSOPRAZOLE 30 MG PO CPDR
30.0000 mg | DELAYED_RELEASE_CAPSULE | Freq: Two times a day (BID) | ORAL | 9 refills | Status: AC
Start: 2016-08-29 — End: ?

## 2016-08-29 NOTE — Progress Notes (Signed)
  Radiation Oncology         [phone] ________________________________  Name: Shane Cantu MRN: [id number]  Date: 08/20/2016  DOB: 27-Jun-1956  End of Treatment Note  DIAGNOSIS:     ICD-9-CM ICD-10-CM   1. Bone metastases (HCC) 198.5 C79.51   Base of Tongue cancer, metastatic  Indication for treatment:  palliative     Radiation treatment dates:   08/07/2016-08/20/2016  Site/dose:   T8-T11 / 30 Gy in 10 fractions  ;  L/S spine and iliac bones / 30 Gy in 10 fractions   Beams/energy:  1) 3D / 10 and 15 X;  2) 3D / 10 and 15 X  Narrative: The patient tolerated radiation treatment relatively well.      Plan: The patient has completed radiation treatment. The patient will return to radiation oncology clinic for routine followup in one month. I advised them to call or return sooner if they have any questions or concerns related to their recovery or treatment.  -----------------------------------  Eppie Gibson, MD

## 2016-08-30 ENCOUNTER — Encounter: Payer: Self-pay | Admitting: Hematology and Oncology

## 2016-08-30 DIAGNOSIS — Z7189 Other specified counseling: Secondary | ICD-10-CM | POA: Insufficient documentation

## 2016-08-30 NOTE — Progress Notes (Signed)
[place] OFFICE PROGRESS NOTE  Patient Care Team: [name], MD as PCP - General (Family Medicine) Leota Sauers, RN as Oncology Nurse Navigator [name], MD as Consulting Physician (Hematology and Oncology) Eppie Gibson, MD as Attending Physician (Radiation Oncology) Karie Mainland, RD as Dietitian (Nutrition)  SUMMARY OF ONCOLOGIC HISTORY:   Cancer of base of tongue ([place])   04/10/2015 Imaging    CT Neck with contrast:  L base of tongue SCC with regional adenopathy; suspected metastatic disease to C2, T2.      04/13/2015 Initial Biopsy    Accession [id number]:  Lymph node, needle/core biopsy - SCC, p16 positive.      04/18/2015 Initial Diagnosis    Tongue cancer      04/24/2015 Imaging    PET CT showed tongue cancer, lung nodule and possible bone mets      04/26/2015 Surgery    He had dental extractions      05/03/2015 Imaging    CT neck with contrast: L base of tongue with regional adenopathy, metastatic disease C2, C3, T2 vertebra; posterior L fourth rib.      05/09/2015 Procedure    Port-a-cath placed.      05/11/2015 - 05/23/2015 Radiation Therapy    Palliative radiation therapy:  1) T1-T3 and Left 3rd posterior rib / 30 Gy in 10 fractions, 2) Base of tongue, neck, and Cspine / 30 Gy in 10 fractions.      06/01/2015 - 07/17/2015 Chemotherapy    He received 2 cycles of carboplatin, 5-FU and cetuximab      06/04/2015 - 06/05/2015 Hospital Admission    He was admitted to the hospital after syncopal episode and had right nondisplaced fibular fracture      07/20/2015 - 07/24/2015 Hospital Admission    he was admitted to the hospital for weakness and uncontrolled nausea and dehydration      08/02/2015 Imaging    PET CT scan showed positive response to treatment      08/04/2015 - 08/12/2015 Hospital Admission    He was admitted to the hospital with sepsis, MRSA bacteremia and respiratory failure      08/07/2015 Surgery    PAC removed r/t sepsis, MRSA   bacteremia.      10/15/2015 Imaging    Ct scan of the neck, chest, abdomen and pelvis showed stable sclerotic lesions. No new disease progression      01/14/2016 Imaging    PET scan showed new hypermetabolic left supraclavicular nodes. Progression of osseous metastasis.       01/22/2016 - 03/04/2016 Chemotherapy    He received Keytruda      03/24/2016 PET scan    PET scan showed mixed response but overall progressive disease      04/14/2016 - 04/25/2016 Radiation Therapy    He received XRT to right anterior 5th rib treated to 30 Gy in 10 fractions & Left posterior neck treated to 30 Gy in 10 fractions       05/22/2016 - 07/04/2016 Chemotherapy    He was started on Taxotere at [facility]. Starting cycle 2, he transferred care back to [facility]. He received a total of 3 cycles of treatment      07/23/2016 PET scan    Overall soft tissue progression. Increase in subcutaneous and nodal metastasis within the neck. Progressive osseous metastasis, including a dominant right-sided T10 lesion involving the pedicle and right transverse process. New left-sided thyroid hypermetabolism could relate to Thyroiditis. Decrease in abdominal mesenteric  hypermetabolism. 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 He returns today with his daughters to discuss goals of care His pain is poorly controlled. He has significant bone pain He has lost some appetite. Denies dysphagia Denies nausea or vomiting. He complained of reflux He has excessive fatigue  REVIEW OF SYSTEMS:   Constitutional: Denies fevers, chills or abnormal weight loss Eyes: Denies blurriness of vision Ears, nose, mouth, throat, and face: Denies mucositis or sore throat Respiratory: Denies cough, dyspnea or wheezes Cardiovascular: Denies palpitation, chest  discomfort or lower extremity swelling Skin: Denies abnormal skin rashes Lymphatics: Denies new lymphadenopathy or easy bruising Neurological:Denies numbness, tingling or new weaknesses Behavioral/Psych: Mood is stable, no new changes  All other systems were reviewed with the patient and are negative.  I have reviewed the past medical history, past surgical history, social history and family history with the patient and they are unchanged from previous note.  ALLERGIES:  is allergic to morphine and related.  MEDICATIONS:  Current Outpatient Prescriptions  Medication Sig Dispense Refill  . amLODipine (NORVASC) 10 MG tablet Take 1 tablet (10 mg total) by mouth daily. 60 tablet 1  . aspirin EC 81 MG tablet Take 81 mg by mouth daily.    . celecoxib (CELEBREX) 200 MG capsule Take 200 mg by mouth daily.     Marland Kitchen dexamethasone (DECADRON) 4 MG tablet Take 8mg  (2 x 4mg  tablets) by mouth twice daily for 1 day prior to (none the day of) and 1 day following chemotherapy. Repeat every 21 Days.    . fluticasone (FLONASE) 50 MCG/ACT nasal spray Place 2 sprays into both nostrils daily. (Patient taking differently: Place 2 sprays into both nostrils 2 (two) times daily as needed for allergies. ) 16 g 0  . HYDROmorphone (DILAUDID) 8 MG tablet Take 1 tablet (8 mg total) by mouth every 6 (six) hours as needed for severe pain. 90 tablet 0  . ibuprofen (ADVIL,MOTRIN) 200 MG tablet Take by mouth.    . lansoprazole (PREVACID) 30 MG capsule Take 1 capsule (30 mg total) by mouth 2 (two) times daily before a meal. 60 capsule 9  . lidocaine (XYLOCAINE) 2 % solution Patient: Mix 1part 2% viscous lidocaine, 1part H20. Swallow 76mL of this mixture, 82min before meals and at bedtime, up to QID 100 mL 5  . Magnesium Oxide 400 MG CAPS Take 1 capsule (400 mg total) by mouth daily. 30 capsule 0  . methadone (DOLOPHINE) 10 MG tablet Take 2 tablets (20 mg total) by mouth every 8 (eight) hours. 90 tablet 0  . polyethylene glycol  powder (GLYCOLAX/MIRALAX) powder Take 17 g by mouth 2 (two) times daily. (Patient taking differently: Take 17 g by mouth 2 (two) times daily as needed for mild constipation. ) 255 g 0  . prochlorperazine (COMPAZINE) 10 MG tablet Take by mouth.    . promethazine (PHENERGAN) 25 MG tablet Take 1 tablet (25 mg total) by mouth every 6 (six) hours as needed for nausea. 60 tablet 3   No current facility-administered medications for this visit.     PHYSICAL EXAMINATION: ECOG PERFORMANCE STATUS: 1 - Symptomatic but completely ambulatory  Vitals:   08/29/16 1436  BP: (P) 125/79  Pulse: (P) 76  Resp: (P) 18  Temp: (P) 97.3 F (36.3 C)   Filed  Weights   08/29/16 1436  Weight: (P) 202 lb 11.2 oz (91.9 kg)    GENERAL:alert, no distress and comfortable SKIN: skin color, texture, turgor are normal, no rashes or significant lesions EYES: normal, Conjunctiva are pink and non-injected, sclera clear OROPHARYNX:no exudate, no erythema and lips, buccal mucosa, and tongue normal  NECK: he has significant swelling around his neck LYMPH:  no palpable lymphadenopathy in the cervical, axillary or inguinal LUNGS: clear to auscultation and percussion with normal breathing effort HEART: regular rate & rhythm and no murmurs and no lower extremity edema ABDOMEN:abdomen soft, non-tender and normal bowel sounds Musculoskeletal:no cyanosis of digits and no clubbing. He has pain on palpation of his chest wall NEURO: alert & oriented x 3 with fluent speech, no focal motor/sensory deficits  LABORATORY DATA:  I have reviewed the data as listed    Component Value Date/Time   NA 142 07/23/2016 0858   K 4.4 07/23/2016 0858   CL 105 04/14/2016 0022   CO2 25 07/23/2016 0858   GLUCOSE 104 07/23/2016 0858   BUN 20.9 07/23/2016 0858   CREATININE 1.4 (H) 07/23/2016 0858   CALCIUM 9.6 07/23/2016 0858   PROT 6.3 (L) 07/23/2016 0858   ALBUMIN 3.3 (L) 07/23/2016 0858   AST 14 07/23/2016 0858   ALT <9 07/23/2016 0858     ALKPHOS 107 07/23/2016 0858   BILITOT 0.63 07/23/2016 0858   GFRNONAA 55 (L) 04/14/2016 0022   GFRAA >60 04/14/2016 0022    No results found for: SPEP, UPEP  Lab Results  Component Value Date   WBC 5.7 07/23/2016   NEUTROABS 4.2 07/23/2016   HGB 11.3 (L) 07/23/2016   HCT 35.2 (L) 07/23/2016   MCV 84.2 07/23/2016   PLT 201 07/23/2016      Chemistry      Component Value Date/Time   NA 142 07/23/2016 0858   K 4.4 07/23/2016 0858   CL 105 04/14/2016 0022   CO2 25 07/23/2016 0858   BUN 20.9 07/23/2016 0858   CREATININE 1.4 (H) 07/23/2016 0858      Component Value Date/Time   CALCIUM 9.6 07/23/2016 0858   ALKPHOS 107 07/23/2016 0858   AST 14 07/23/2016 0858   ALT <9 07/23/2016 0858   BILITOT 0.63 07/23/2016 0858        ASSESSMENT & PLAN:  Cancer of base of tongue ([place]) Unfortunately, recent imaging studies showed the patient has significant disease progression with diffuse, new bone lesions. He had recently completed radiation treatment to areas of painful bone lesions We discussed and reviewed the current guidelines. I estimated response rates with for 4th line chemotherapy would be less than 20%. I would not imagine any meaningful addition to quality of life with systemic treatment. I recommend consideration of transitioning the goals of care to palliative measures and consider enrollment to home-based palliative care program and hospice in the future after completion of radiation treatment. 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 We discussed importance of Advanced Directives and Living will. We also discussed potential referral for home base palliative care in the future but he does not need the services now Per advanced directives, the patient's code status is DO NOT RESUSCITATE However, after much discussion with him, he actually wants to change his advanced directives to FULL CODE Both his daughters: Amy and Caryl Pina are his dedicated MPOA He felt that if his situation does not improved after being placed in life sustaining measures, he will allow discontinuation of these measures after a period of 7-10 days He declined placement of feeding tube or kidney dialysis   No orders of the defined types were placed in this encounter.  All questions were answered. The patient knows to call the clinic with any problems, questions or concerns. No barriers to learning was detected. I spent 40 minutes counseling the patient face to face. The total time spent in the appointment was 60 minutes and more than 50% was on counseling and review of test results     [name], MD 08/30/2016 8:18 AM

## 2016-08-30 NOTE — Assessment & Plan Note (Signed)
Unfortunately, recent imaging studies showed the patient has significant disease progression with diffuse, new bone lesions. He had recently completed radiation treatment to areas of painful bone lesions We discussed and reviewed the current guidelines. I estimated response rates with for 4th line chemotherapy would be less than 20%. I would not imagine any meaningful addition to quality of life with systemic treatment. I recommend consideration of transitioning the goals of care to palliative measures and consider enrollment to home-based palliative care program and hospice in the future after completion of radiation treatment. The patient is ambivalent regarding decision to stop palliative chemotherapy He wants to go home and think about it I will see him back in 3 weeks for further discussion

## 2016-08-30 NOTE — Assessment & Plan Note (Signed)
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 We discussed importance of Advanced Directives and Living will. We also discussed potential referral for home base palliative care in the future but he does not need the services now Per advanced directives, the patient's code status is DO NOT RESUSCITATE However, after much discussion with him, he actually wants to change his advanced directives to FULL CODE Both his daughters: Amy and Caryl Pina are his dedicated MPOA He felt that if his situation does not improved after being placed in life sustaining measures, he will allow discontinuation of these measures after a period of 7-10 days He declined placement of feeding tube or kidney dialysis

## 2016-08-30 NOTE — Assessment & Plan Note (Signed)
His pain remained poorly controlled especially in bony areas I recommend increasing doses of Dilaudid to 8 mg prn I recommend also addition of 2 mg dexamethasone daily and will reassess pain in 3 weeks

## 2016-08-30 NOTE — Assessment & Plan Note (Signed)
Examination revealed diffuse lymphedema around his neck and lymphadenopathy around his neck I took pictures I suspect he may have disease progression

## 2016-09-02 ENCOUNTER — Ambulatory Visit: Payer: PPO | Attending: Radiation Oncology | Admitting: Physical Therapy

## 2016-09-02 DIAGNOSIS — M25512 Pain in left shoulder: Secondary | ICD-10-CM | POA: Insufficient documentation

## 2016-09-02 DIAGNOSIS — R293 Abnormal posture: Secondary | ICD-10-CM | POA: Diagnosis present

## 2016-09-02 DIAGNOSIS — I890 Lymphedema, not elsewhere classified: Secondary | ICD-10-CM | POA: Insufficient documentation

## 2016-09-02 NOTE — Therapy (Signed)
[facility] [address] [place], [place], [address] Phone: [phone]   Fax:  [phone]  Physical Therapy Treatment  Patient Details  Name: Shane Cantu MRN: [id number] Date of Birth: [birth year] Referring Provider: Lonie Peak, MD  Encounter Date: 09/02/2016      PT End of Session - 09/02/16 1705    Visit Number 4   Number of Visits 9   Date for PT Re-Evaluation 09/19/16   PT Start Time 1514   PT Stop Time 1600   PT Time Calculation (min) 46 min   Activity Tolerance Patient tolerated treatment well   Behavior During Therapy [facility] for tasks assessed/performed      Past Medical History:  Diagnosis Date  . Allergic rhinitis   . Arthritis   . Back pain   . Bacterial conjunctivitis of left eye 07/26/2015  . Cancer (HCC)     may 2016 tongue cancer  . Chronic fatigue 01/11/2016  . Chronic neck pain   . Chronic pain   . GERD (gastroesophageal reflux disease)   . History of kidney stones   . Hypertension   . Migraine headache without aura   . Neuropathy (HCC)   . Pharyngitis, acute 09/06/2015  . Radiation 05/11/15-05/23/15   T1-T3 and Left 3rd posterior rib, Base of tongue, neck and Cspine  . Radiation 04/14/16-04/25/16   right anterior 5th rib, left posterior neck  . Sleep apnea    does not use cpap    Past Surgical History:  Procedure Laterality Date  . BACK SURGERY  03/1993, 10/2002   Dr. Otelia Sergeant  . COLONOSCOPY    . LYMPH NODE BIOPSY    . MULTIPLE EXTRACTIONS WITH ALVEOLOPLASTY N/A 04/26/2015   Procedure: Extraction of tooth #'s 6,11,12,15,20,21,22,27,28 with alveoloplasty;  Surgeon: Charlynne Pander, DDS;  Location: [facility] OR;  Service: Oral Surgery;  Laterality: N/A;  . Nasal sinusotomy  1977  . NECK SURGERY  2004   Dr. Otelia Sergeant  . RADIOLOGY WITH ANESTHESIA N/A 08/09/2015   Procedure: MRI LUMBAR WITH/WITHOUT CONTRAST, CERVICAL WITH CONTRAST  (RADIOLOGY WITH ANESTHESIA);  Surgeon: Medication Radiologist, MD;  Location:  [facility] OR;  Service: Radiology;  Laterality: N/A;  . TONSILLECTOMY     as a child    There were no vitals filed for this visit.      Subjective Assessment - 09/02/16 1515    Subjective Has tried manual lymph drainage.  Daughter did fine with doing this a couple times, but has to remind herself to use a light touch.  Has used the foam chip pack a couple hours a day and can tell it works.   Currently in Pain? Yes   Pain Score 5    Pain Location Neck   Pain Orientation Left   Pain Type Other (Comment)  inflammation   Aggravating Factors  swollen first thing in the morning, possibly rubbing it in his sleep   Pain Relieving Factors as the day goes by                         [facility] Adult PT Treatment/Exercise - 09/02/16 0001      Self-Care   Self-Care Other Self-Care Comments   Other Self-Care Comments  showed patient and his daughter samples of a couple of head & neck compression garments, then showed pictures in notebook, discussing need to have both neck and cheeks covered; gave website options for them to begin researching this.     Manual  Therapy   Manual Lymphatic Drainage (MLD) With patient's daughter observing and with therapist reinforcing with verbal instruction:  in supine with HOB elevated, diaphragmatic breathing, supraclavicular fossae, bilat. axillae, bilat. shoulder collectors; posterolateral,, lateral, anterolateral, and anterior neck; chin and cheeks going up over ears and around back of ears to lateral neck, then reinforcing all pathways down to axillae.                PT Education - 09/02/16 1704    Education provided Yes   Education Details reviewed manual lymph drainage verbally and with demonstration; educated about manufactured compression garment options.   Person(s) Educated Patient;Child(ren)   Methods Explanation;Handout;Demonstration   Comprehension Verbalized understanding                Long Term Clinic Goals - 09/02/16  1708      CC Long Term Goal  #1   Title Patient will be knowledgeable in how to obtain and wear compression garments.   Status Partially Met            Plan - 09/02/16 1705    Clinical Impression Statement Patient with soreness today at left neck, but reported feeling better after manual lymph drainage.  Patient's daughter was present for review, but wanted to observe rather than perform it.  Pt. and his daughter will do some web research about obtaining a manufactured compression garment after discussion of options today.  If needed, therapist will help them measure once they have found the garment they would like to get.  It sounds from the patient and his daughter like Flexitouch would cost him $3500.   Rehab Potential Fair   Clinical Impairments Affecting Rehab Potential current radiation (completes 08/20/16), bony metastases   PT Frequency 2x / week   PT Duration 4 weeks   PT Treatment/Interventions Manual lymph drainage;Manual techniques;Therapeutic exercise;Vasopneumatic Device;Taping;Passive range of motion;DME Instruction;ADLs/Self Care Home Management;Patient/family education   PT Next Visit Plan Assess goals; assess pt's daughter for indep with MLD; see if they have decided on a garment to order and assist with measuring as needed   Consulted and Agree with Plan of Care Patient      Patient will benefit from skilled therapeutic intervention in order to improve the following deficits and impairments:  Pain, Increased edema  Visit Diagnosis: Lymphedema, not elsewhere classified     Problem List Patient Active Problem List   Diagnosis Date Noted  . Goals of care, counseling/discussion 08/30/2016  . Palliative care by specialist   . DNR (do not resuscitate) discussion   . Localized swelling of both lower legs 07/04/2016  . Lymphedema of face 07/04/2016  . Bone metastases ([place]) 04/02/2016  . Palliative care encounter 03/25/2016  . Elevated serum creatinine 02/12/2016   . Metastasis to supraclavicular lymph node ([place]) 01/15/2016  . Metastasis to bone ([place]) 01/15/2016  . Chronic fatigue 01/11/2016  . Acute frontal sinusitis 12/12/2015  . Pharyngitis, acute 09/06/2015  . Right wrist pain   . OSA treated with BiPAP   . Metastatic cancer to bone ([place])   . Diabetes type 2, controlled ([place])   . Acute respiratory failure with hypoxia and hypercarbia (HCC)   . OSA on CPAP   . Chronic neck and back pain   . Chronic diastolic congestive heart failure ([place])   . Anemia due to chemotherapy   . Hyperglycemia   . Hypokalemia   . Acute dyspnea   . Infection due to portacath   . Screen for STD (sexually  transmitted disease)   . HCAP (healthcare-associated pneumonia) 08/04/2015  . Sepsis ([place]) 08/04/2015  . Acute respiratory failure with hypoxia ([place]) 08/04/2015  . Elevated troponin 08/04/2015  . Acute hyperglycemia 08/04/2015  . RBBB 08/04/2015  . Sepsis due to pneumonia ([place]) 08/04/2015  . OSA (obstructive sleep apnea) 08/04/2015  . Closed right ankle fracture 08/04/2015  . HTN (hypertension) 08/04/2015  . QT prolongation 08/04/2015  . Bacterial conjunctivitis of left eye 07/26/2015  . Nausea   . Dehydration 07/21/2015  . Hypomagnesemia 07/20/2015  . Cancer associated pain 07/20/2015  . Chemotherapy-induced nausea 07/13/2015  . Anemia due to chronic illness 06/21/2015  . Leukopenia due to antineoplastic chemotherapy ([place]) 06/21/2015  . Mucositis due to chemotherapy 06/08/2015  . Acneiform drug eruption 06/08/2015  . Fever 06/04/2015  . Syncope, vasovagal 06/04/2015  . Coagulopathy ([place]) 06/04/2015  . Fibula fracture 06/04/2015  . Vasovagal syncope   . Rib pain on right side   . Neck swelling 05/09/2015  . Cancer of base of tongue ([place]) 04/18/2015  . Chronic neck pain 04/18/2015    Cantu,Shane 09/02/2016, 5:10 PM  [place], [place], [address] Phone:  [phone]   Fax:  [phone]  Name: Shane Cantu MRN: [id number] Date of Birth: 18-Oct-1956   Serafina Royals, PT 09/02/16 5:10 PM

## 2016-09-04 ENCOUNTER — Ambulatory Visit: Payer: PPO | Admitting: Physical Therapy

## 2016-09-04 DIAGNOSIS — R293 Abnormal posture: Secondary | ICD-10-CM

## 2016-09-04 DIAGNOSIS — I890 Lymphedema, not elsewhere classified: Secondary | ICD-10-CM | POA: Diagnosis not present

## 2016-09-04 NOTE — Therapy (Signed)
[place], [place], [address] Phone: [phone]   Fax:  [phone]  Physical Therapy Treatment  Patient Details  Name: Shane Cantu MRN: [id number] Date of Birth: 06-21-1956 Referring Provider: Eppie Gibson, MD  Encounter Date: 09/04/2016      PT End of Session - 09/04/16 1613    Visit Number 5   Number of Visits 9   Date for PT Re-Evaluation 09/19/16   PT Start Time 1520   PT Stop Time 1610   PT Time Calculation (min) 50 min   Activity Tolerance Patient tolerated treatment well   Behavior During Therapy [facility] for tasks assessed/performed      Past Medical History:  Diagnosis Date  . Allergic rhinitis   . Arthritis   . Back pain   . Bacterial conjunctivitis of left eye 07/26/2015  . Cancer ([place])     may 2016 tongue cancer  . Chronic fatigue 01/11/2016  . Chronic neck pain   . Chronic pain   . GERD (gastroesophageal reflux disease)   . History of kidney stones   . Hypertension   . Migraine headache without aura   . Neuropathy ([place])   . Pharyngitis, acute 09/06/2015  . Radiation 05/11/15-05/23/15   T1-T3 and Left 3rd posterior rib, Base of tongue, neck and Cspine  . Radiation 04/14/16-04/25/16   right anterior 5th rib, left posterior neck  . Sleep apnea    does not use cpap    Past Surgical History:  Procedure Laterality Date  . BACK SURGERY  03/1993, 10/2002   Dr. Louanne Skye  . COLONOSCOPY    . LYMPH NODE BIOPSY    . MULTIPLE EXTRACTIONS WITH ALVEOLOPLASTY N/A 04/26/2015   Procedure: Extraction of tooth #'s 6,11,12,15,20,21,22,27,28 with alveoloplasty;  Surgeon: [name], DDS;  Location: [place];  Service: Oral Surgery;  Laterality: N/A;  . Nasal sinusotomy  1977  . NECK SURGERY  2004   Dr. Louanne Skye  . RADIOLOGY WITH ANESTHESIA N/A 08/09/2015   Procedure: MRI LUMBAR WITH/WITHOUT CONTRAST, CERVICAL WITH CONTRAST  (RADIOLOGY WITH ANESTHESIA);  Surgeon: Medication Radiologist, MD;  Location:  [place];  Service: Radiology;  Laterality: N/A;  . TONSILLECTOMY     as a child    There were no vitals filed for this visit.      Subjective Assessment - 09/04/16 1520    Subjective Pt says the soreness has been coming out of it a bit. the swelling goes down, but it comes right back . His daughter is doing the massage at least once a day and he moves around a lot. 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  Pt has also received chemotherapy. Tomorrow is his last day of radiation and he still has to decide if he wants to do more chemotherapy.    Patient Stated Goals to get swelling in neck down   Currently in Pain? Yes   Pain Score 5    Pain Location Ear   Pain Orientation Left   Aggravating Factors  worse in the morning                          [facility] Adult PT Treatment/Exercise - 09/04/16  0001      Neck Exercises: Seated   Other Seated Exercise neck range of motion and shoulder rolls      Manual Therapy   Edema Management upgraded compression to lined foam with thick gary foam backing in thin stockinette to see if that is less irritating to sore spot on left neck    Manual Lymphatic Drainage (MLD) In sitting, short neck, shoulder collectors, in supine, superficial and deep abdominals, both axillary and pectoral nodes, anterior neck, then, in right sidelying, left neck and periauricular area with daughter watching and trying technique in sidelying.    Kinesiotex Edema     Kinesiotix   Edema skinkote, then fan shape from neck, periauricular are to left anterior axilla                 PT Education - 09/04/16 1624    Education provided Yes   Education Details remove tape at any sign of irritation    Person(s) Educated Child(ren)    Methods Explanation   Comprehension Verbalized understanding                [facility] Goals - 09/02/16 1708      CC Long Term Goal  #1   Title Patient will be knowledgeable in how to obtain and wear compression garments.   Status Partially Met            Plan - 09/04/16 1615    Clinical Impression Statement Pt continues with soreness in left neck and ear.  Daughter present and found a head strap on internet that should work,  Helped her to measure for a size xlarge and she says she can sew to help make adjustments.  He will not be getting the Flexitouch due to cost Upgraded MLD technique today to do position pt on right side for gravity assist and suggested he try to lie on this position for about 15 minutes before getting out of bed to see if that helps decrease AM soreness. Also upgraded foam to peach lined foam with 1/2 gray foam backing as he found chip pack irritating to spot on left neck. tried kinesiotape Pt reports he felt better after treatment.     Rehab Potential Fair   Clinical Impairments Affecting Rehab Potential current radiation (completes 08/20/16), bony metastases   PT Treatment/Interventions Manual lymph drainage;Manual techniques;Therapeutic exercise;Vasopneumatic Device;Taping;Passive range of motion;DME Instruction;ADLs/Self Care Home Management;Patient/family education   PT Next Visit Plan Remeasure, assess if upgrades effective,  If so, continue MLD in sidelying once chest is decongested and continue with kinesiotape    Consulted and Agree with Plan of Care Patient;Family member/caregiver   Family Member Consulted daughter Earnest Bailey       Patient will benefit from skilled therapeutic intervention in order to improve the following deficits and impairments:  Pain, Increased edema  Visit Diagnosis: Lymphedema, not elsewhere classified  Abnormal posture     Problem List Patient Active Problem List   Diagnosis Date Noted  . Goals of care,  counseling/discussion 08/30/2016  . Palliative care by specialist   . DNR (do not resuscitate) discussion   . Localized swelling of both lower legs 07/04/2016  . Lymphedema of face 07/04/2016  . Bone metastases ([place]) 04/02/2016  . Palliative care encounter 03/25/2016  . Elevated serum creatinine 02/12/2016  . 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 Metastasis to bone ([place]) 01/15/2016  . Chronic fatigue 01/11/2016  . Acute frontal sinusitis 12/12/2015  . Pharyngitis, acute 09/06/2015  .  Right wrist pain   . OSA treated with BiPAP   . Metastatic cancer to bone ([place])   . Diabetes type 2, controlled ([place])   . Acute respiratory failure with hypoxia and hypercarbia (HCC)   . OSA on CPAP   . Chronic neck and back pain   . Chronic diastolic congestive heart failure ([place])   . Anemia due to chemotherapy   . Hyperglycemia   . Hypokalemia   . Acute dyspnea   . Infection due to portacath   . Screen for STD (sexually transmitted disease)   . HCAP (healthcare-associated pneumonia) 08/04/2015  . Sepsis ([place]) 08/04/2015  . Acute respiratory failure with hypoxia ([place]) 08/04/2015  . Elevated troponin 08/04/2015  . Acute hyperglycemia 08/04/2015  . RBBB 08/04/2015  . Sepsis due to pneumonia ([place]) 08/04/2015  . OSA (obstructive sleep apnea) 08/04/2015  . Closed right ankle fracture 08/04/2015  . HTN (hypertension) 08/04/2015  . QT prolongation 08/04/2015  . Bacterial conjunctivitis of left eye 07/26/2015  . Nausea   . Dehydration 07/21/2015  . Hypomagnesemia 07/20/2015  . Cancer associated pain 07/20/2015  . Chemotherapy-induced nausea 07/13/2015  . Anemia due to chronic illness 06/21/2015  . Leukopenia due to antineoplastic chemotherapy ([place]) 06/21/2015  . Mucositis due to chemotherapy 06/08/2015  . Acneiform drug eruption 06/08/2015  . Fever 06/04/2015  . Syncope, vasovagal 06/04/2015  . Coagulopathy ([place]) 06/04/2015  . Fibula fracture 06/04/2015  . Vasovagal  syncope   . Rib pain on right side   . Neck swelling 05/09/2015  . Cancer of base of tongue ([place]) 04/18/2015  . Chronic neck pain 04/18/2015   Donato Heinz. Owens Shark PT  Norwood Levo 09/04/2016, 4:28 PM  [place], [place], [address] Phone: [phone]   Fax:  [phone]  Name: KALYN DIMATTIA MRN: [id number] Date of Birth: [birth year]

## 2016-09-05 ENCOUNTER — Telehealth: Payer: Self-pay | Admitting: Hematology and Oncology

## 2016-09-05 NOTE — Telephone Encounter (Signed)
SPOKE WITH DTR AMY RE LAB/FU 10/20

## 2016-09-09 ENCOUNTER — Ambulatory Visit: Payer: PPO | Admitting: Physical Therapy

## 2016-09-09 ENCOUNTER — Encounter: Payer: Self-pay | Admitting: Physical Therapy

## 2016-09-09 DIAGNOSIS — I890 Lymphedema, not elsewhere classified: Secondary | ICD-10-CM | POA: Diagnosis not present

## 2016-09-09 DIAGNOSIS — R293 Abnormal posture: Secondary | ICD-10-CM

## 2016-09-09 NOTE — Therapy (Signed)
[place], [place], [address] Phone: [phone]   Fax:  [phone]  Physical Therapy Treatment  Patient Details  Name: Shane Cantu MRN: [id number] Date of Birth: 09-16-1956 Referring Provider: Eppie Gibson, MD  Encounter Date: 09/09/2016      PT End of Session - 09/09/16 1351    Visit Number 6   Number of Visits 9   Date for PT Re-Evaluation 09/19/16   PT Start Time 1301   PT Stop Time 1346   PT Time Calculation (min) 45 min   Activity Tolerance Patient tolerated treatment well   Behavior During Therapy [facility] for tasks assessed/performed      Past Medical History:  Diagnosis Date  . Allergic rhinitis   . Arthritis   . Back pain   . Bacterial conjunctivitis of left eye 07/26/2015  . Cancer ([place])     may 2016 tongue cancer  . Chronic fatigue 01/11/2016  . Chronic neck pain   . Chronic pain   . GERD (gastroesophageal reflux disease)   . History of kidney stones   . Hypertension   . Migraine headache without aura   . Neuropathy ([place])   . Pharyngitis, acute 09/06/2015  . Radiation 05/11/15-05/23/15   T1-T3 and Left 3rd posterior rib, Base of tongue, neck and Cspine  . Radiation 04/14/16-04/25/16   right anterior 5th rib, left posterior neck  . Sleep apnea    does not use cpap    Past Surgical History:  Procedure Laterality Date  . BACK SURGERY  03/1993, 10/2002   Dr. Louanne Skye  . COLONOSCOPY    . LYMPH NODE BIOPSY    . MULTIPLE EXTRACTIONS WITH ALVEOLOPLASTY N/A 04/26/2015   Procedure: Extraction of tooth #'s 6,11,12,15,20,21,22,27,28 with alveoloplasty;  Surgeon: [name], DDS;  Location: [place];  Service: Oral Surgery;  Laterality: N/A;  . Nasal sinusotomy  1977  . NECK SURGERY  2004   Dr. Louanne Skye  . RADIOLOGY WITH ANESTHESIA N/A 08/09/2015   Procedure: MRI LUMBAR WITH/WITHOUT CONTRAST, CERVICAL WITH CONTRAST  (RADIOLOGY WITH ANESTHESIA);  Surgeon: Medication Radiologist, MD;  Location:  [place];  Service: Radiology;  Laterality: N/A;  . TONSILLECTOMY     as a child    There were no vitals filed for this visit.      Subjective Assessment - 09/09/16 1304    Subjective I think laying on my side is helping. If I wake up in the night I will roll to my right side and it helps with the pain. I can not really tell the difference with the new foam. We tried to order the chin strap but they were out of it so we had to order a different one. I left the tape on for about 24 hours and it seemed to help.    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  Pt has also received chemotherapy. Tomorrow is his last day of radiation and he still has to decide if he wants to do more chemotherapy.    Patient Stated Goals to get swelling in neck down   Currently in Pain? Yes   Pain Score 5    Pain Location Ear   Pain Orientation Left   Pain Descriptors / Indicators Pressure;Constant  Pain Type Other (Comment)  inflammation   Pain Onset More than a month ago   Pain Frequency Constant   Pain Score 5   Pain Location Neck   Pain Orientation Left   Pain Descriptors / Indicators Pressure;Constant   Pain Type Chronic pain   Pain Onset More than a month ago   Pain Frequency Constant                         [facility] Adult PT Treatment/Exercise - 09/09/16 0001      Manual Therapy   Manual Lymphatic Drainage (MLD) In sitting, short neck, shoulder collectors, in supine, superficial and deep abdominals, both axillary and pectoral nodes, anterior neck, then, in right sidelying, left neck and periauricular area   Kinesiotex Edema     Kinesiotix   Edema skinkote, then fan shape from neck, periauricular are to left anterior axilla                         Long  Term Clinic Goals - 09/09/16 1355      CC Long Term Goal  #1   Title Patient will be knowledgeable in how to obtain and wear compression garments.   Baseline L 60, R 55   Time 4   Period Weeks   Status Partially Met     CC Long Term Goal  #2   Title Reduce circumference measurement at 8 cm superior to sternal notch to 46 cm or less.   Time 4   Period Weeks   Status On-going     CC Long Term Goal  #3   Title Pt will be independent in self manual lymphatic drainage technique for long term management of edema   Baseline 9/28- pt's daughter instructed in correct technique and given detailed handout since she lives with pt   Time 4   Period Weeks   Status On-going     CC Long Term Goal  #4   Title Pt will report decreased max pain to 4/10 to allow improved comfort   Time 4   Period Weeks   Status On-going            Plan - 09/09/16 1351    Clinical Impression Statement Pt states his soreness improved some after last session and when he wakes up in pain he turns to his right side and it helps. Patient is awaiting arrival of a head and neck garment. He could not tell a difference with the new foam pad. Performed MLD with patient on right side again for increased drainage and applied kinesiotape at the end of the session.    Rehab Potential Fair   Clinical Impairments Affecting Rehab Potential current radiation (completes 08/20/16), bony metastases   PT Frequency 2x / week   PT Duration 4 weeks   PT Treatment/Interventions Manual lymph drainage;Manual techniques;Therapeutic exercise;Vasopneumatic Device;Taping;Passive range of motion;DME Instruction;ADLs/Self Care Home Management;Patient/family education   PT Next Visit Plan Remeasure and assess goals, continue MLD in sidelying once chest is decongested and continue with kinesiotape if pt still feels kinesiotape is effective   Consulted and Agree with Plan of Care Patient      Patient will benefit from skilled therapeutic  intervention in order to improve the following deficits and impairments:  Pain, Increased edema  Visit Diagnosis: Lymphedema, not elsewhere classified  Abnormal posture     Problem List Patient Active Problem List   Diagnosis Date Noted  .  Goals of care, counseling/discussion 08/30/2016  . Palliative care by specialist   . DNR (do not resuscitate) discussion   . Localized swelling of both lower legs 07/04/2016  . Lymphedema of face 07/04/2016  . Bone metastases ([place]) 04/02/2016  . Palliative care encounter 03/25/2016  . Elevated serum creatinine 02/12/2016  . 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 Metastasis to bone ([place]) 01/15/2016  . Chronic fatigue 01/11/2016  . Acute frontal sinusitis 12/12/2015  . Pharyngitis, acute 09/06/2015  . Right wrist pain   . OSA treated with BiPAP   . Metastatic cancer to bone ([place])   . Diabetes type 2, controlled ([place])   . Acute respiratory failure with hypoxia and hypercarbia (HCC)   . OSA on CPAP   . Chronic neck and back pain   . Chronic diastolic congestive heart failure ([place])   . Anemia due to chemotherapy   . Hyperglycemia   . Hypokalemia   . Acute dyspnea   . Infection due to portacath   . Screen for STD (sexually transmitted disease)   . HCAP (healthcare-associated pneumonia) 08/04/2015  . Sepsis ([place]) 08/04/2015  . Acute respiratory failure with hypoxia ([place]) 08/04/2015  . Elevated troponin 08/04/2015  . Acute hyperglycemia 08/04/2015  . RBBB 08/04/2015  . Sepsis due to pneumonia ([place]) 08/04/2015  . OSA (obstructive sleep apnea) 08/04/2015  . Closed right ankle fracture 08/04/2015  . HTN (hypertension) 08/04/2015  . QT prolongation 08/04/2015  . Bacterial conjunctivitis of left eye 07/26/2015  . Nausea   . Dehydration 07/21/2015  . Hypomagnesemia 07/20/2015  . Cancer associated pain 07/20/2015  . Chemotherapy-induced nausea 07/13/2015  . Anemia due to chronic illness 06/21/2015  . Leukopenia due  to antineoplastic chemotherapy ([place]) 06/21/2015  . Mucositis due to chemotherapy 06/08/2015  . Acneiform drug eruption 06/08/2015  . Fever 06/04/2015  . Syncope, vasovagal 06/04/2015  . Coagulopathy ([place]) 06/04/2015  . Fibula fracture 06/04/2015  . Vasovagal syncope   . Rib pain on right side   . Neck swelling 05/09/2015  . Cancer of base of tongue ([place]) 04/18/2015  . Chronic neck pain 04/18/2015    Alexia Freestone 09/09/2016, 1:56 PM  [place], [place], [address] Phone: [phone]   Fax:  [phone]  Name: Shane Cantu MRN: [id number] Date of Birth: 01-05-1956  Allyson Sabal, PT 09/09/16 1:56 PM

## 2016-09-10 ENCOUNTER — Telehealth: Payer: Self-pay | Admitting: *Deleted

## 2016-09-10 NOTE — Telephone Encounter (Signed)
2% lidocaine is the strongest throat numbing medication for local pain control

## 2016-09-10 NOTE — Telephone Encounter (Signed)
Pt states sore throat not relived with Lidocaine 2%.  He asks if there is some type of "Lozenge" [name] can prescribe?  He is taking his pain medication as directed. He feels MMW does not help much.  He c/o throat dry and scratchy and sore.   Suggested some OTC throat lozenges but pt says he needs a stronger prescription lozenges.  Informed him I am unaware of prescription lozenges but will ask [name] and call him back.  He verbalized understanding.

## 2016-09-11 ENCOUNTER — Ambulatory Visit: Payer: PPO | Admitting: Physical Therapy

## 2016-09-11 ENCOUNTER — Telehealth: Payer: Self-pay | Admitting: *Deleted

## 2016-09-11 DIAGNOSIS — R293 Abnormal posture: Secondary | ICD-10-CM

## 2016-09-11 DIAGNOSIS — I890 Lymphedema, not elsewhere classified: Secondary | ICD-10-CM

## 2016-09-11 NOTE — Telephone Encounter (Addendum)
Oncology Nurse Navigator Documentation  Shane Cantu daughter Amy called to express concern about her dad. She reported:  He is increasingly angry, "being ugly", "playing Earnest Bailey (other sister) and me against each other".  He has not followed up with hospice referral.  Is closing on sale of his home 11/11 but "he doesn't know where he is going to live afterwards".  "I don't know what to do for him". I offered that she cannot control his behaviors but encouraged her to pursue support for herself and her sister.  I encouraged her to consider [facility] and/or [facility].  She agreed to check with Hospice. She understands I will join her and her dad at next Friday's appt with [name].  Gayleen Orem, RN, BSN, [facility] [phone]

## 2016-09-11 NOTE — Therapy (Signed)
[place], [place], [address] Phone: [phone]   Fax:  [phone]  Physical Therapy Treatment  Patient Details  Name: Shane Cantu MRN: [id number] Date of Birth: [birth year] Referring Provider: Eppie Gibson, MD  Encounter Date: 09/11/2016      PT End of Session - 09/11/16 1538    Visit Number 7   Number of Visits 9   Date for PT Re-Evaluation 09/19/16   PT Start Time 1300   PT Stop Time 1350   PT Time Calculation (min) 50 min   Activity Tolerance Patient tolerated treatment well   Behavior During Therapy [facility] for tasks assessed/performed      Past Medical History:  Diagnosis Date  . Allergic rhinitis   . Arthritis   . Back pain   . Bacterial conjunctivitis of left eye 07/26/2015  . Cancer ([place])     may 2016 tongue cancer  . Chronic fatigue 01/11/2016  . Chronic neck pain   . Chronic pain   . GERD (gastroesophageal reflux disease)   . History of kidney stones   . Hypertension   . Migraine headache without aura   . Neuropathy ([place])   . Pharyngitis, acute 09/06/2015  . Radiation 05/11/15-05/23/15   T1-T3 and Left 3rd posterior rib, Base of tongue, neck and Cspine  . Radiation 04/14/16-04/25/16   right anterior 5th rib, left posterior neck  . Sleep apnea    does not use cpap    Past Surgical History:  Procedure Laterality Date  . BACK SURGERY  03/1993, 10/2002   Dr. Louanne Skye  . COLONOSCOPY    . LYMPH NODE BIOPSY    . MULTIPLE EXTRACTIONS WITH ALVEOLOPLASTY N/A 04/26/2015   Procedure: Extraction of tooth #'s 6,11,12,15,20,21,22,27,28 with alveoloplasty;  Surgeon: [name], DDS;  Location: [facility];  Service: Oral Surgery;  Laterality: N/A;  . Nasal sinusotomy  1977  . NECK SURGERY  2004   Dr. Louanne Skye  . RADIOLOGY WITH ANESTHESIA N/A 08/09/2015   Procedure: MRI LUMBAR WITH/WITHOUT CONTRAST, CERVICAL WITH CONTRAST  (RADIOLOGY WITH ANESTHESIA);  Surgeon: Medication Radiologist, MD;  Location:  [facility];  Service: Radiology;  Laterality: N/A;  . TONSILLECTOMY     as a child    There were no vitals filed for this visit.      Subjective Assessment - 09/11/16 1302    Subjective Patient reports he has a lot of swelling today. He is not sure why this is.   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  Pt has also received chemotherapy. Tomorrow is his last day of radiation and he still has to decide if he wants to do more chemotherapy.    Patient Stated Goals to get swelling in neck down   Currently in Pain? Yes   Pain Score 5    Pain Location Ear   Pain Orientation Left   Pain Descriptors / Indicators Pressure;Constant   Pain Type Other (Comment)  inflammation   Pain Onset More than a month ago   Pain Frequency Constant               LYMPHEDEMA/ONCOLOGY QUESTIONNAIRE - 09/11/16 1303      Head and Neck   4 cm superior to sternal notch around neck (  P)  45.5 cm   6 cm superior to sternal notch around neck (P)  46.5 cm                  [facility] Adult PT Treatment/Exercise - 09/11/16 0001      Manual Therapy   Manual Lymphatic Drainage (MLD) In sitting, short neck, shoulder collectors, in supine, superficial and deep abdominals, both axillary and pectoral nodes, anterior neck, then, in right sidelying, left neck and periauricular area   Kinesiotex Edema     Kinesiotix   Edema skinkote, then 2 fan shape pieces, 1 from neck/periauricular area to left anterior axilla , 1 from neck/periauricular area to left posterior axilla                        Long Term Clinic Goals - 09/11/16 1541      CC Long Term Goal  #1   Title Patient will be knowledgeable in how to obtain and wear compression garments.   Baseline garments have been ordered    Time 4   Period Weeks   Status Partially Met     CC Long Term Goal  #2   Title Reduce circumference measurement at 8 cm superior to sternal notch to 46 cm or less.   Time 4   Period Weeks   Status On-going     CC Long Term Goal  #3   Title Pt will be independent in self manual lymphatic drainage technique for long term management of edema   Baseline 9/28- pt's daughter instructed in correct technique and given detailed handout since she lives with pt   Time 4   Period Weeks   Status On-going     CC Long Term Goal  #4   Title Pt will report decreased max pain to 4/10 to allow improved comfort   Baseline 5/10 on 09/11/16   Time 4   Period Weeks   Status On-going            Plan - 09/11/16 1539    Clinical Impression Statement Patient reports an increase in his swelling since last night, but he is not sure why. Therapist continued with manual lymph drainage today with patient stating he felt a significant reduction in the amount of pressure at the end of the session. Therapist applied 2 pieces of kinesiotape at the end of the session.   Rehab Potential Fair   Clinical Impairments Affecting Rehab Potential current radiation (completes 08/20/16), bony metastases   PT Frequency 2x / week   PT Duration 4 weeks   PT Treatment/Interventions Manual lymph drainage;Manual techniques;Therapeutic exercise;Vasopneumatic Device;Taping;Passive range of motion;DME Instruction;ADLs/Self Care Home Management;Patient/family education   PT Next Visit Plan continue MLD in sidelying and continue with kinesiotape if pt still feels kinesiotape is effective   Consulted and Agree with Plan of Care Patient      Patient will benefit from skilled therapeutic intervention in order to improve the following deficits and impairments:  Pain, Increased edema  Visit Diagnosis: Lymphedema, not elsewhere classified  Abnormal posture     Problem List Patient Active Problem List   Diagnosis Date Noted  .  Goals of care, counseling/discussion 08/30/2016  . Palliative care by specialist   . DNR (do not resuscitate) discussion   . Localized swelling of both lower legs 07/04/2016  . Lymphedema of face 07/04/2016  . Bone metastases ([place]) 04/02/2016  . Palliative care encounter 03/25/2016  . Elevated serum creatinine 02/12/2016  .  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 Metastasis to bone ([place]) 01/15/2016  . Chronic fatigue 01/11/2016  . Acute frontal sinusitis 12/12/2015  . Pharyngitis, acute 09/06/2015  . Right wrist pain   . OSA treated with BiPAP   . Metastatic cancer to bone ([place])   . Diabetes type 2, controlled ([place])   . Acute respiratory failure with hypoxia and hypercarbia (HCC)   . OSA on CPAP   . Chronic neck and back pain   . Chronic diastolic congestive heart failure ([place])   . Anemia due to chemotherapy   . Hyperglycemia   . Hypokalemia   . Acute dyspnea   . Infection due to portacath   . Screen for STD (sexually transmitted disease)   . HCAP (healthcare-associated pneumonia) 08/04/2015  . Sepsis ([place]) 08/04/2015  . Acute respiratory failure with hypoxia ([place]) 08/04/2015  . Elevated troponin 08/04/2015  . Acute hyperglycemia 08/04/2015  . RBBB 08/04/2015  . Sepsis due to pneumonia ([place]) 08/04/2015  . OSA (obstructive sleep apnea) 08/04/2015  . Closed right ankle fracture 08/04/2015  . HTN (hypertension) 08/04/2015  . QT prolongation 08/04/2015  . Bacterial conjunctivitis of left eye 07/26/2015  . Nausea   . Dehydration 07/21/2015  . Hypomagnesemia 07/20/2015  . Cancer associated pain 07/20/2015  . Chemotherapy-induced nausea 07/13/2015  . Anemia due to chronic illness 06/21/2015  . Leukopenia due to antineoplastic chemotherapy ([place]) 06/21/2015  . Mucositis due to chemotherapy 06/08/2015  . Acneiform drug eruption 06/08/2015  . Fever 06/04/2015  . Syncope, vasovagal 06/04/2015  . Coagulopathy ([place]) 06/04/2015  . Fibula fracture 06/04/2015   . Vasovagal syncope   . Rib pain on right side   . Neck swelling 05/09/2015  . Cancer of base of tongue ([place]) 04/18/2015  . Chronic neck pain 04/18/2015    Mellody Life 09/11/2016, 3:43 PM  [place], [place], [address] Phone: [phone]   Fax:  [phone]  Name: Shane Cantu MRN: [id number] Date of Birth: December 19, 1955   Saverio Danker, SPT

## 2016-09-11 NOTE — Telephone Encounter (Signed)
Informed pt of Dr. Calton Dach message and suggested he try OTC lozenges such as cepacol and cloraseptic.  Continue lidocaine and pain medication as directed. He verbalized understanding.

## 2016-09-12 ENCOUNTER — Encounter: Payer: Self-pay | Admitting: *Deleted

## 2016-09-12 ENCOUNTER — Telehealth: Payer: Self-pay | Admitting: *Deleted

## 2016-09-12 MED ORDER — METHADONE HCL 10 MG PO TABS: 20.0000 mg | ORAL_TABLET | Freq: Three times a day (TID) | ORAL | 0 refills | Status: DC

## 2016-09-12 NOTE — Telephone Encounter (Signed)
Pt requests refill Methadone to pick up on Monday.  Informed pt Rx ready to pick up.

## 2016-09-12 NOTE — Progress Notes (Signed)
Shane Cantu  Prior to today's visit, CSW received phone call from [place], patient's daughter.  Shane Cantu shared the patient was been "difficult" and "angry" and she does not know how to communicate with her father.  CSW provided brief emotional support and discussed possible factors that may cause these behavior changes including fear of death, loss of control, and difficulty communicating emotions.  CSW encouraged patient's daughter to utilize free counseling provided at cancer center.  Clinical Social Cantu met with patient and patient's daughter, Shane Cantu, in [place] office at patient's request to modify healthcare advance directives.  CSW reviewed patient's existing advance directives with patient and daughter.  [name] choose to assign his daughter Shane Cantu as primary HCPOA and daughter Shane Cantu as secondary HCPOA.   [name] did not change his wishes regarding the living will, but had questions regarding feeding tube placement.  [name] reported he does not want life prolonging measures if in an end of life scenario as stated in the living will.   The patient shared he does want a feeding tube if it is for temporary placement while he has good quality of life (to be defined by patient and oncologist)- if he is actively dying or has poor quality of life, he does not want feeding tube.  Clinical Social Cantu encouraged patient to share his reactions to this discussion and how he was feeling about his current health situation.  [name] reported his main stressor at this time was selling his home and finding a new place to live.  Patient's daughter Shane Cantu is from [place] but plans to stay with the patient as long as needed.  CSW provided support to both patient and daughter.  CSW and patient/family discussed Hospice support both as an emotional and Advice worker.  Patient agreed to establish relationship with Hospice and plans to call them next week.  Patient's daughter Shane Cantu was receptive to counseling  services, CSW made referral to [facility] counseling intern.  Polo Riley, MSW, LCSW, OSW-C Clinical Social Worker [facility] [phone]

## 2016-09-16 ENCOUNTER — Ambulatory Visit: Payer: PPO | Admitting: Physical Therapy

## 2016-09-16 DIAGNOSIS — I890 Lymphedema, not elsewhere classified: Secondary | ICD-10-CM | POA: Diagnosis not present

## 2016-09-16 DIAGNOSIS — R293 Abnormal posture: Secondary | ICD-10-CM

## 2016-09-16 NOTE — Therapy (Signed)
[place], [place], [address] Phone: [phone]   Fax:  [phone]  Physical Therapy Treatment  Patient Details  Name: Shane Cantu MRN: [id number] Date of Birth: Jan 02, 1956 Referring Provider: Eppie Gibson, MD  Encounter Date: 09/16/2016      PT End of Session - 09/16/16 1602    Visit Number 8   Number of Visits 9   Date for PT Re-Evaluation 09/19/16   PT Start Time 1302   PT Stop Time 1351   PT Time Calculation (min) 49 min   Activity Tolerance Patient tolerated treatment well   Behavior During Therapy [facility] for tasks assessed/performed      Past Medical History:  Diagnosis Date  . Allergic rhinitis   . Arthritis   . Back pain   . Bacterial conjunctivitis of left eye 07/26/2015  . Cancer ([place])     may 2016 tongue cancer  . Chronic fatigue 01/11/2016  . Chronic neck pain   . Chronic pain   . GERD (gastroesophageal reflux disease)   . History of kidney stones   . Hypertension   . Migraine headache without aura   . Neuropathy ([place])   . Pharyngitis, acute 09/06/2015  . Radiation 05/11/15-05/23/15   T1-T3 and Left 3rd posterior rib, Base of tongue, neck and Cspine  . Radiation 04/14/16-04/25/16   right anterior 5th rib, left posterior neck  . Sleep apnea    does not use cpap    Past Surgical History:  Procedure Laterality Date  . BACK SURGERY  03/1993, 10/2002   Dr. Louanne Skye  . COLONOSCOPY    . LYMPH NODE BIOPSY    . MULTIPLE EXTRACTIONS WITH ALVEOLOPLASTY N/A 04/26/2015   Procedure: Extraction of tooth #'s 6,11,12,15,20,21,22,27,28 with alveoloplasty;  Surgeon: [name], DDS;  Location: [facility];  Service: Oral Surgery;  Laterality: N/A;  . Nasal sinusotomy  1977  . NECK SURGERY  2004   Dr. Louanne Skye  . RADIOLOGY WITH ANESTHESIA N/A 08/09/2015   Procedure: MRI LUMBAR WITH/WITHOUT CONTRAST, CERVICAL WITH CONTRAST  (RADIOLOGY WITH ANESTHESIA);  Surgeon: Medication Radiologist, MD;  Location:  [facility];  Service: Radiology;  Laterality: N/A;  . TONSILLECTOMY     as a child    There were no vitals filed for this visit.      Subjective Assessment - 09/16/16 1304    Subjective Patient states the swelling is worse again today; noticed it increased about 2 days ago. The tape really seemed to help.   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  Pt has also received chemotherapy. Tomorrow is his last day of radiation and he still has to decide if he wants to do more chemotherapy.    Patient Stated Goals to get swelling in neck down   Currently in Pain? Yes   Pain Score 5    Pain Location Neck   Pain Orientation Left;Lateral   Pain Descriptors / Indicators Sore                         [facility] Adult PT Treatment/Exercise - 09/16/16 0001      Manual Therapy   Manual Lymphatic Drainage (MLD) In sitting, short neck, shoulder collectors, in supine, superficial and  deep abdominals, both axillary and pectoral nodes, anterior neck; chin, cheek, and periauricular area   Kinesiotex Edema     Kinesiotix   Edema skinkote, then 2 fan shape pieces, 1 from neck/periauricular area to left anterior axilla , 1 from neck/periauricular area to left posterior axilla                PT Education - 09/16/16 1657    Education provided Yes   Education Details instructed pt's daughter in application of kinesiotape   Person(s) Educated Patient;Child(ren)  daughter Jeanice Lim   Methods Explanation;Demonstration   Comprehension Verbalized understanding                Long Term Clinic Goals - 09/11/16 1541      CC Long Term Goal  #1   Title Patient will be knowledgeable in how to obtain and wear compression garments.   Baseline garments have been ordered   Time 4    Period Weeks   Status Partially Met     CC Long Term Goal  #2   Title Reduce circumference measurement at 8 cm superior to sternal notch to 46 cm or less.   Time 4   Period Weeks   Status On-going     CC Long Term Goal  #3   Title Pt will be independent in self manual lymphatic drainage technique for long term management of edema   Baseline 9/28- pt's daughter instructed in correct technique and given detailed handout since she lives with pt   Time 4   Period Weeks   Status On-going     CC Long Term Goal  #4   Title Pt will report decreased max pain to 4/10 to allow improved comfort   Baseline 5/10 on 09/11/16   Time 4   Period Weeks   Status On-going            Plan - 09/16/16 1655    Clinical Impression Statement Patient reports the MLD and tape really seemed to help after last session, but 2 days ago the swelling increased again. He reports a large decrease in the amount of pressure from the swelling at the end of the session. Therapist ended session with applying 2 pieces of kinesiotape while instructing patient's daughter in how to apply the tape.   Rehab Potential Fair   Clinical Impairments Affecting Rehab Potential current radiation (completes 08/20/16), bony metastases   PT Frequency 2x / week   PT Duration 4 weeks   PT Treatment/Interventions Manual lymph drainage;Manual techniques;Therapeutic exercise;Vasopneumatic Device;Taping;Passive range of motion;DME Instruction;ADLs/Self Care Home Management;Patient/family education   PT Next Visit Plan Discuss whether pt is ready for discharge or re-certify; continue MLD and continue with kinesiotape if pt still feels kinesiotape is effective   Consulted and Agree with Plan of Care Patient   Family Member Consulted daughter Jeanice Lim       Patient will benefit from skilled therapeutic intervention in order to improve the following deficits and impairments:  Pain, Increased edema  Visit Diagnosis: Lymphedema, not elsewhere  classified  Abnormal posture     Problem List Patient Active Problem List   Diagnosis Date Noted  . Goals of care, counseling/discussion 08/30/2016  . Palliative care by specialist   . DNR (do not resuscitate) discussion   . Localized swelling of both lower legs 07/04/2016  . Lymphedema of face 07/04/2016  . Bone metastases (HCC) 04/02/2016  . Palliative care encounter 03/25/2016  . Elevated serum creatinine 02/12/2016  . Metastasis to  supraclavicular lymph node ([place]) 01/15/2016  . Metastasis to bone ([place]) 01/15/2016  . Chronic fatigue 01/11/2016  . Acute frontal sinusitis 12/12/2015  . Pharyngitis, acute 09/06/2015  . Right wrist pain   . OSA treated with BiPAP   . Metastatic cancer to bone ([place])   . Diabetes type 2, controlled ([place])   . Acute respiratory failure with hypoxia and hypercarbia (HCC)   . OSA on CPAP   . Chronic neck and back pain   . Chronic diastolic congestive heart failure ([place])   . Anemia due to chemotherapy   . Hyperglycemia   . Hypokalemia   . Acute dyspnea   . Infection due to portacath   . Screen for STD (sexually transmitted disease)   . HCAP (healthcare-associated pneumonia) 08/04/2015  . Sepsis ([place]) 08/04/2015  . Acute respiratory failure with hypoxia ([place]) 08/04/2015  . Elevated troponin 08/04/2015  . Acute hyperglycemia 08/04/2015  . RBBB 08/04/2015  . Sepsis due to pneumonia ([place]) 08/04/2015  . OSA (obstructive sleep apnea) 08/04/2015  . Closed right ankle fracture 08/04/2015  . HTN (hypertension) 08/04/2015  . QT prolongation 08/04/2015  . Bacterial conjunctivitis of left eye 07/26/2015  . Nausea   . Dehydration 07/21/2015  . Hypomagnesemia 07/20/2015  . Cancer associated pain 07/20/2015  . Chemotherapy-induced nausea 07/13/2015  . Anemia due to chronic illness 06/21/2015  . Leukopenia due to antineoplastic chemotherapy ([place]) 06/21/2015  . Mucositis due to chemotherapy 06/08/2015  . Acneiform drug eruption 06/08/2015  . Fever  06/04/2015  . Syncope, vasovagal 06/04/2015  . Coagulopathy ([place]) 06/04/2015  . Fibula fracture 06/04/2015  . Vasovagal syncope   . Rib pain on right side   . Neck swelling 05/09/2015  . Cancer of base of tongue ([place]) 04/18/2015  . Chronic neck pain 04/18/2015    Mellody Life 09/16/2016, 4:59 PM  [place], [place], [address] Phone: [phone]   Fax:  [phone]  Name: Shane Cantu MRN: [id number] Date of Birth: [birth year]   Saverio Danker, SPT

## 2016-09-18 ENCOUNTER — Ambulatory Visit: Payer: PPO | Admitting: Physical Therapy

## 2016-09-18 ENCOUNTER — Encounter: Payer: Self-pay | Admitting: *Deleted

## 2016-09-18 DIAGNOSIS — M25512 Pain in left shoulder: Secondary | ICD-10-CM

## 2016-09-18 DIAGNOSIS — R293 Abnormal posture: Secondary | ICD-10-CM

## 2016-09-18 DIAGNOSIS — I890 Lymphedema, not elsewhere classified: Secondary | ICD-10-CM | POA: Diagnosis not present

## 2016-09-18 NOTE — Therapy (Signed)
Shane Cantu, [place], [address] Phone: [phone]   Fax:  [phone]  Physical Therapy Treatment  Patient Details  Name: Shane Cantu MRN: [id number] Date of Birth: 21-Jul-1956 Referring Provider: Eppie Gibson, Cantu  Encounter Date: 09/18/2016      PT End of Session - 09/18/16 1749    Visit Number 9   Number of Visits 17   Date for PT Re-Evaluation 10/20/16   PT Start Time 1300   PT Stop Time 1345   PT Time Calculation (min) 45 min   Activity Tolerance Patient tolerated treatment well   Behavior During Therapy Shane Cantu for tasks assessed/performed      Past Medical History:  Diagnosis Date  . Allergic rhinitis   . Arthritis   . Back pain   . Bacterial conjunctivitis of left eye 07/26/2015  . Cancer ([place])     may 2016 tongue cancer  . Chronic fatigue 01/11/2016  . Chronic neck pain   . Chronic pain   . GERD (gastroesophageal reflux disease)   . History of kidney stones   . Hypertension   . Migraine headache without aura   . Neuropathy ([place])   . Pharyngitis, acute 09/06/2015  . Radiation 05/11/15-05/23/15   T1-T3 and Left 3rd posterior rib, Base of tongue, neck and Cspine  . Radiation 04/14/16-04/25/16   right anterior 5th rib, left posterior neck  . Sleep apnea    does not use cpap    Past Surgical History:  Procedure Laterality Date  . BACK SURGERY  03/1993, 10/2002   Dr. Louanne Cantu  . COLONOSCOPY    . LYMPH NODE BIOPSY    . MULTIPLE EXTRACTIONS WITH ALVEOLOPLASTY N/A 04/26/2015   Procedure: Extraction of tooth #'s 6,11,12,15,20,21,22,27,28 with alveoloplasty;  Surgeon: Shane Cantu, DDS;  Location: [facility];  Service: Oral Surgery;  Laterality: N/A;  . Nasal sinusotomy  1977  . NECK SURGERY  2004   Dr. Louanne Cantu  . RADIOLOGY WITH ANESTHESIA N/A 08/09/2015   Procedure: MRI LUMBAR WITH/WITHOUT CONTRAST, CERVICAL WITH CONTRAST  (RADIOLOGY WITH ANESTHESIA);  Surgeon: Shane Cantu;  Location:  [facility];  Service: Radiology;  Laterality: N/A;  . TONSILLECTOMY     as a child    There were no vitals filed for this visit.      Subjective Assessment - 09/18/16 1308    Subjective Pt reports he is having pain in his right side that is flared up more than normal.  He got some relief from the tape and was able to keep it on until the next day.  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  Pt has also received chemotherapy. Tomorrow is his last day of radiation and he still has to decide if he wants to do more chemotherapy.    Patient Stated Goals to get swelling in neck down   Currently in Pain? Yes   Pain Score 4    Pain Location Neck   Pain Orientation Left   Pain Descriptors / Indicators Sore;Constant   Pain Radiating Towards radiates up to ear  Pain Onset More than a month ago   Pain Frequency Constant   Pain Score 6   Pain Location Hip   Pain Orientation Right   Pain Descriptors / Indicators Burning   Pain Type Chronic pain   Pain Onset More than a month ago   Pain Frequency Intermittent   Aggravating Factors  standing or moving around    Pain Relieving Factors staying still                LYMPHEDEMA/ONCOLOGY QUESTIONNAIRE - 09/18/16 1311      Head and Neck   4 cm superior to sternal notch around neck 46 cm   6 cm superior to sternal notch around neck 46 cm   8 cm superior to sternal notch around neck 47 cm                  [facility] Adult PT Treatment/Exercise - 09/18/16 0001      Manual Therapy   Manual Lymphatic Drainage (MLD) In sitting, short neck, shoulder collectors, in supine, superficial and deep  abdominals, both axillary and pectoral nodes, anterior neck, then, in right sidelying, left neck and periauricular area   Kinesiotex Edema     Kinesiotix   Edema skinkote, then 2 fan shape pieces, 1 from neck/periauricular area to left anterior axilla , 1 from neck/periauricular area to left posterior axilla                        Long Term Clinic Goals - 09/18/16 1753      CC Long Term Goal  #1   Title Patient will be knowledgeable in how to obtain and wear compression garments.   Baseline daughter is ordering garments    Status Partially Met     CC Long Term Goal  #2   Title Reduce circumference measurement at 8 cm superior to sternal notch to 46 cm or less.   Status On-going     CC Long Term Goal  #3   Title Pt will be independent in self manual lymphatic drainage technique for long term management of edema   Baseline daughter is helping patient with this at home    Status Achieved     CC Long Term Goal  #4   Title Pt will report decreased max pain to 4/10 to allow improved comfort   Status On-going            Plan - 09/18/16 1749    Clinical Impression Statement [name] continues to have firmness and pain in neck and face to ear with lymphedema.  His daughter Earnest Bailey is helping him at home, but he does not have his compression garment or kinesiotape at home for follow up treatment.  He wants to continue for another 4 weeks as he receives relief from pain    Rehab Potential Fair   Clinical Impairments Affecting Rehab Potential recent radiation (completed 08/20/2016), bony metastases   PT Frequency 2x / week   PT Duration 4 weeks   PT Treatment/Interventions Manual lymph drainage;Manual techniques;Therapeutic exercise;Vasopneumatic Device;Taping;Passive range of motion;DME Instruction;ADLs/Self Care Home Management;Patient/family education   PT Next Visit Plan continue MLD and continue with kinesiotape if pt still feels kinesiotape is effective  Gcode next  visit   Consulted and Agree with Plan of Care Patient      Patient will benefit from skilled therapeutic intervention in order to improve the following deficits and impairments:  Pain, Increased edema, Decreased range of  motion, Decreased scar mobility, Increased fascial restricitons, Postural dysfunction  Visit Diagnosis: Lymphedema, not elsewhere classified - Plan: PT plan of care cert/re-cert  Abnormal posture - Plan: PT plan of care cert/re-cert  Left shoulder pain, unspecified chronicity - Plan: PT plan of care cert/re-cert     Problem List Patient Active Problem List   Diagnosis Date Noted  . Goals of care, counseling/discussion 08/30/2016  . Palliative care by specialist   . DNR (do not resuscitate) discussion   . Localized swelling of both lower legs 07/04/2016  . Lymphedema of face 07/04/2016  . Bone metastases ([place]) 04/02/2016  . Palliative care encounter 03/25/2016  . Elevated serum creatinine 02/12/2016  . 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 Metastasis to bone ([place]) 01/15/2016  . Chronic fatigue 01/11/2016  . Acute frontal sinusitis 12/12/2015  . Pharyngitis, acute 09/06/2015  . Right wrist pain   . OSA treated with BiPAP   . Metastatic cancer to bone ([place])   . Diabetes type 2, controlled ([place])   . Acute respiratory failure with hypoxia and hypercarbia ([facility])   . OSA on CPAP   . Chronic neck and back pain   . Chronic diastolic congestive heart failure ([place])   . Anemia due to chemotherapy   . Hyperglycemia   . Hypokalemia   . Acute dyspnea   . Infection due to portacath   . Screen for STD (sexually transmitted disease)   . HCAP (healthcare-associated pneumonia) 08/04/2015  . Sepsis ([place]) 08/04/2015  . Acute respiratory failure with hypoxia ([place]) 08/04/2015  . Elevated troponin 08/04/2015  . Acute hyperglycemia 08/04/2015  . RBBB 08/04/2015  . Sepsis due to pneumonia ([place]) 08/04/2015  . OSA (obstructive sleep apnea) 08/04/2015   . Closed right ankle fracture 08/04/2015  . HTN (hypertension) 08/04/2015  . QT prolongation 08/04/2015  . Bacterial conjunctivitis of left eye 07/26/2015  . Nausea   . Dehydration 07/21/2015  . Hypomagnesemia 07/20/2015  . Cancer associated pain 07/20/2015  . Chemotherapy-induced nausea 07/13/2015  . Anemia due to chronic illness 06/21/2015  . Leukopenia due to antineoplastic chemotherapy ([place]) 06/21/2015  . Mucositis due to chemotherapy 06/08/2015  . Acneiform drug eruption 06/08/2015  . Fever 06/04/2015  . Syncope, vasovagal 06/04/2015  . Coagulopathy ([place]) 06/04/2015  . Fibula fracture 06/04/2015  . Vasovagal syncope   . Rib pain on right side   . Neck swelling 05/09/2015  . Cancer of base of tongue ([place]) 04/18/2015  . Chronic neck pain 04/18/2015   Donato Heinz. Owens Shark PT  Norwood Levo 09/18/2016, 5:57 PM  [facility] [place], [place], [address] Phone: [phone]   Fax:  [phone]  Name: Shane Cantu MRN: [id number] Date of Birth: 1956-03-13

## 2016-09-18 NOTE — Progress Notes (Signed)
Shane Cantu presents for follow up of radiation completed:  05/10/15- 05/23/15 to his T1-T3 and Left 3rd posterior rib and Base of tongue, neck, and C-spine 04/14/16- 04/25/16 to his Right anterior 5th rib, and Left posterior neck. 08/07/16- 08/20/16 to his T8-T11 and L/S spine and iliac bones.   Pain issues, if any: He reports pain in his left ear and left side of his head. He rates it a 4/10. He is taking 20mg  of methadone every 8 hours, and 8mg  of dilaudid about every 5 hours for pain relief.  Using a feeding tube?: No Weight changes, if any:  Wt Readings from Last 3 Encounters:  09/24/16 207 lb 9.6 oz (94.2 kg)  09/19/16 207 lb 4.8 oz (94 kg)  08/29/16 (P) 202 lb 11.2 oz (91.9 kg)   Swallowing issues, if any: He feels like food gets stuck in his throat at times, but will wash down. He states there is a place in his throat that feels like food can't get past at times.  Smoking or chewing tobacco? No Using fluoride trays daily? No Last ENT visit was on: Not recently Other notable issues, if any:  He reports daily bowel movements.   BP 113/71   Pulse 64   Temp 97.9 F (36.6 C)   Ht 6' (1.829 m)   Wt 207 lb 9.6 oz (94.2 kg)   SpO2 97% Comment: room air  BMI 28.16 kg/m

## 2016-09-19 ENCOUNTER — Ambulatory Visit (HOSPITAL_BASED_OUTPATIENT_CLINIC_OR_DEPARTMENT_OTHER): Payer: PPO | Admitting: Hematology and Oncology

## 2016-09-19 ENCOUNTER — Encounter: Payer: Self-pay | Admitting: Hematology and Oncology

## 2016-09-19 ENCOUNTER — Other Ambulatory Visit (HOSPITAL_BASED_OUTPATIENT_CLINIC_OR_DEPARTMENT_OTHER): Payer: PPO

## 2016-09-19 ENCOUNTER — Telehealth: Payer: Self-pay | Admitting: *Deleted

## 2016-09-19 ENCOUNTER — Other Ambulatory Visit: Payer: Self-pay | Admitting: *Deleted

## 2016-09-19 DIAGNOSIS — C7951 Secondary malignant neoplasm of bone: Secondary | ICD-10-CM

## 2016-09-19 DIAGNOSIS — C770 Secondary and unspecified malignant neoplasm of lymph nodes of head, face and neck: Secondary | ICD-10-CM | POA: Diagnosis not present

## 2016-09-19 DIAGNOSIS — C029 Malignant neoplasm of tongue, unspecified: Secondary | ICD-10-CM

## 2016-09-19 DIAGNOSIS — C01 Malignant neoplasm of base of tongue: Secondary | ICD-10-CM

## 2016-09-19 DIAGNOSIS — G893 Neoplasm related pain (acute) (chronic): Secondary | ICD-10-CM | POA: Diagnosis not present

## 2016-09-19 DIAGNOSIS — Z7189 Other specified counseling: Secondary | ICD-10-CM

## 2016-09-19 LAB — COMPREHENSIVE METABOLIC PANEL
ALT: 16 U/L (ref 0–55)
ANION GAP: 11 meq/L (ref 3–11)
AST: 15 U/L (ref 5–34)
Albumin: 3 g/dL — ABNORMAL LOW (ref 3.5–5.0)
Alkaline Phosphatase: 135 U/L (ref 40–150)
BILIRUBIN TOTAL: 0.34 mg/dL (ref 0.20–1.20)
BUN: 30.9 mg/dL — AB (ref 7.0–26.0)
CHLORIDE: 101 meq/L (ref 98–109)
CO2: 26 meq/L (ref 22–29)
CREATININE: 1.2 mg/dL (ref 0.7–1.3)
Calcium: 9.9 mg/dL (ref 8.4–10.4)
EGFR: 67 mL/min/{1.73_m2} — ABNORMAL LOW (ref >90)
GLUCOSE: 95 mg/dL (ref 70–140)
Potassium: 4.2 mEq/L (ref 3.5–5.1)
SODIUM: 138 meq/L (ref 136–145)
TOTAL PROTEIN: 7.1 g/dL (ref 6.4–8.3)

## 2016-09-19 LAB — CBC WITH DIFFERENTIAL/PLATELET
BASO%: 0.6 % (ref 0.0–2.0)
Basophils Absolute: 0 10*3/uL (ref 0.0–0.1)
EOS%: 1.3 % (ref 0.0–7.0)
Eosinophils Absolute: 0.1 10*3/uL (ref 0.0–0.5)
HCT: 38.9 % (ref 38.4–49.9)
HGB: 12.2 g/dL — ABNORMAL LOW (ref 13.0–17.1)
LYMPH%: 4.9 % — AB (ref 14.0–49.0)
MCH: 26.6 pg — ABNORMAL LOW (ref 27.2–33.4)
MCHC: 31.4 g/dL — ABNORMAL LOW (ref 32.0–36.0)
MCV: 84.6 fL (ref 79.3–98.0)
MONO#: 0.5 10*3/uL (ref 0.1–0.9)
MONO%: 6.8 % (ref 0.0–14.0)
NEUT%: 86.4 % — ABNORMAL HIGH (ref 39.0–75.0)
NEUTROS ABS: 6.4 10*3/uL (ref 1.5–6.5)
PLATELETS: 182 10*3/uL (ref 140–400)
RBC: 4.6 10*6/uL (ref 4.20–5.82)
RDW: 17.9 % — ABNORMAL HIGH (ref 11.0–14.6)
WBC: 7.3 10*3/uL (ref 4.0–10.3)
lymph#: 0.4 10*3/uL — ABNORMAL LOW (ref 0.9–3.3)

## 2016-09-19 NOTE — Assessment & Plan Note (Signed)
His pain remained poorly controlled especially in bony areas and over his left neck He will continue his current pain medication regimen with methadone, Dilaudid and dexamethasone

## 2016-09-19 NOTE — Telephone Encounter (Signed)
Letter to excuse pt's daughter, Jaithan Morlan, from [facility] [employer] faxed to [phone] per Ashley's request.

## 2016-09-19 NOTE — Assessment & Plan Note (Signed)
He has signs of disease progression on the left side of his neck. Without further systemic treatment, his prognosis is very poor and longevity is probably can be measured in less than 3 months. He has an appointment to meet with radiation oncologist next week to discuss for potential further palliative radiation treatment. I would defer to her radiation oncologist to discuss with the patient the benefit of further radiation therapy

## 2016-09-19 NOTE — Assessment & Plan Note (Signed)
Examination revealed diffuse lymphedema around his neck and lymphadenopathy around his neck I took pictures I suspect he may have disease progression I would defer palliative radiation therapy discussion to radiation oncologist. In the meantime, I recommend he continues taking dexamethasone to help reduce the swelling and that in turn might help with pain

## 2016-09-19 NOTE — Telephone Encounter (Signed)
Hospice Referral made to [facility] [phone]. Informed Bambi w/ [facility] that pt may need Palliative Radiation and she said that would not disqualify pt from Hospice Admission.   Face sheet, insurance information and office notes given to [employer], Rep from [place]. She came by our office to pick up the records.

## 2016-09-19 NOTE — Progress Notes (Signed)
[facility] OFFICE PROGRESS NOTE  Patient Care Team: [name], MD as PCP - General (Family Medicine) Leota Sauers, RN as Oncology Nurse Navigator [name], MD as Consulting Physician (Hematology and Oncology) Eppie Gibson, MD as Attending Physician (Radiation Oncology) Karie Mainland, RD as Dietitian (Nutrition)  SUMMARY OF ONCOLOGIC HISTORY:   Cancer of base of tongue ([place])   04/10/2015 Imaging    CT Neck with contrast:  L base of tongue SCC with regional adenopathy; suspected metastatic disease to C2, T2.      04/13/2015 Initial Biopsy    Accession [id number]:  Lymph node, needle/core biopsy - SCC, p16 positive.      04/18/2015 Initial Diagnosis    Tongue cancer      04/24/2015 Imaging    PET CT showed tongue cancer, lung nodule and possible bone mets      04/26/2015 Surgery    He had dental extractions      05/03/2015 Imaging    CT neck with contrast: L base of tongue with regional adenopathy, metastatic disease C2, C3, T2 vertebra; posterior L fourth rib.      05/09/2015 Procedure    Port-a-cath placed.      05/11/2015 - 05/23/2015 Radiation Therapy    Palliative radiation therapy:  1) T1-T3 and Left 3rd posterior rib / 30 Gy in 10 fractions, 2) Base of tongue, neck, and Cspine / 30 Gy in 10 fractions.      06/01/2015 - 07/17/2015 Chemotherapy    He received 2 cycles of carboplatin, 5-FU and cetuximab      06/04/2015 - 06/05/2015 Hospital Admission    He was admitted to the hospital after syncopal episode and had right nondisplaced fibular fracture      07/20/2015 - 07/24/2015 Hospital Admission    he was admitted to the hospital for weakness and uncontrolled nausea and dehydration      08/02/2015 Imaging    PET CT scan showed positive response to treatment      08/04/2015 - 08/12/2015 Hospital Admission    He was admitted to the hospital with sepsis, MRSA bacteremia and respiratory failure      08/07/2015 Surgery    PAC removed r/t sepsis, MRSA   bacteremia.      10/15/2015 Imaging    Ct scan of the neck, chest, abdomen and pelvis showed stable sclerotic lesions. No new disease progression      01/14/2016 Imaging    PET scan showed new hypermetabolic left supraclavicular nodes. Progression of osseous metastasis.       01/22/2016 - 03/04/2016 Chemotherapy    He received Keytruda      03/24/2016 PET scan    PET scan showed mixed response but overall progressive disease      04/14/2016 - 04/25/2016 Radiation Therapy    He received XRT to right anterior 5th rib treated to 30 Gy in 10 fractions & Left posterior neck treated to 30 Gy in 10 fractions       05/22/2016 - 07/04/2016 Chemotherapy    He was started on Taxotere at [facility]. Starting cycle 2, he transferred care back to [facility]. He received a total of 3 cycles of treatment      07/23/2016 PET scan    Overall soft tissue progression. Increase in subcutaneous and nodal metastasis within the neck. Progressive osseous metastasis, including a dominant right-sided T10 lesion involving the pedicle and right transverse process. New left-sided thyroid hypermetabolism could relate to Thyroiditis. Decrease in abdominal mesenteric  hypermetabolism. 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 He returns for further supportive care visit. Recent dexamethasone has helped with appetite but he has increasing pain over the left side of his neck recently. He continues to take methadone and Dilaudid as needed. He denies swallowing difficulties. His energy level is fair. He denies new neurological deficit  REVIEW OF SYSTEMS:   Constitutional: Denies fevers, chills or abnormal weight loss Eyes: Denies blurriness of  vision Ears, nose, mouth, throat, and face: Denies mucositis or sore throat Respiratory: Denies cough, dyspnea or wheezes Cardiovascular: Denies palpitation, chest discomfort or lower extremity swelling Gastrointestinal:  Denies nausea, heartburn or change in bowel habits Skin: Denies abnormal skin rashes Neurological:Denies numbness, tingling or new weaknesses Behavioral/Psych: Mood is stable, no new changes  All other systems were reviewed with the patient and are negative.  I have reviewed the past medical history, past surgical history, social history and family history with the patient and they are unchanged from previous note.  ALLERGIES:  is allergic to morphine and related.  MEDICATIONS:  Current Outpatient Prescriptions  Medication Sig Dispense Refill  . amLODipine (NORVASC) 10 MG tablet Take 1 tablet (10 mg total) by mouth daily. 60 tablet 1  . aspirin EC 81 MG tablet Take 81 mg by mouth daily.    . celecoxib (CELEBREX) 200 MG capsule Take 200 mg by mouth daily.     Marland Kitchen dexamethasone (DECADRON) 4 MG tablet Take 8mg  (2 x 4mg  tablets) by mouth twice daily for 1 day prior to (none the day of) and 1 day following chemotherapy. Repeat every 21 Days.    . fluticasone (FLONASE) 50 MCG/ACT nasal spray Place 2 sprays into both nostrils daily. (Patient taking differently: Place 2 sprays into both nostrils 2 (two) times daily as needed for allergies. ) 16 g 0  . HYDROmorphone (DILAUDID) 8 MG tablet Take 1 tablet (8 mg total) by mouth every 6 (six) hours as needed for severe pain. 90 tablet 0  . ibuprofen (ADVIL,MOTRIN) 200 MG tablet Take by mouth.    . lansoprazole (PREVACID) 30 MG capsule Take 1 capsule (30 mg total) by mouth 2 (two) times daily before a meal. 60 capsule 9  . lidocaine (XYLOCAINE) 2 % solution Patient: Mix 1part 2% viscous lidocaine, 1part H20. Swallow 28mL of this mixture, 31min before meals and at bedtime, up to QID 100 mL 5  . Magnesium Oxide 400 MG CAPS Take 1 capsule  (400 mg total) by mouth daily. 30 capsule 0  . methadone (DOLOPHINE) 10 MG tablet Take 2 tablets (20 mg total) by mouth every 8 (eight) hours. 90 tablet 0  . polyethylene glycol powder (GLYCOLAX/MIRALAX) powder Take 17 g by mouth 2 (two) times daily. (Patient taking differently: Take 17 g by mouth 2 (two) times daily as needed for mild constipation. ) 255 g 0  . prochlorperazine (COMPAZINE) 10 MG tablet Take by mouth.    . promethazine (PHENERGAN) 25 MG tablet Take 1 tablet (25 mg total) by mouth every 6 (six) hours as needed for nausea. 60 tablet 3   No current facility-administered medications for this visit.     PHYSICAL EXAMINATION: ECOG PERFORMANCE STATUS:  1 - Symptomatic but completely ambulatory  Vitals:   09/19/16 1339  BP: 135/82  Pulse: 69  Resp: 18  Temp: 97.5 F (36.4 C)   Filed Weights   09/19/16 1339  Weight: 207 lb 4.8 oz (94 kg)    GENERAL:alert, no distress and comfortable. He looks mildly cushingoid SKIN: skin color, texture, turgor are normal, no rashes or significant lesions EYES: normal, Conjunctiva are pink and non-injected, sclera clear OROPHARYNX:no exudate, no erythema and lips, buccal mucosa, and tongue normal  NECK: He has diffuse lymphedema on the right side of the neck with diffuse lymphadenopathy on the left side of the neck, suspicious for cancer recurrence LUNGS: clear to auscultation and percussion with normal breathing effort HEART: regular rate & rhythm and no murmurs and no lower extremity edema ABDOMEN:abdomen soft, non-tender and normal bowel sounds Musculoskeletal:no cyanosis of digits and no clubbing  NEURO: alert & oriented x 3 with fluent speech, no focal motor/sensory deficits  LABORATORY DATA:  I have reviewed the data as listed    Component Value Date/Time   NA 138 09/19/2016 1323   K 4.2 09/19/2016 1323   CL 105 04/14/2016 0022   CO2 26 09/19/2016 1323   GLUCOSE 95 09/19/2016 1323   BUN 30.9 (H) 09/19/2016 1323   CREATININE  1.2 09/19/2016 1323   CALCIUM 9.9 09/19/2016 1323   PROT 7.1 09/19/2016 1323   ALBUMIN 3.0 (L) 09/19/2016 1323   AST 15 09/19/2016 1323   ALT 16 09/19/2016 1323   ALKPHOS 135 09/19/2016 1323   BILITOT 0.34 09/19/2016 1323   GFRNONAA 55 (L) 04/14/2016 0022   GFRAA >60 04/14/2016 0022    No results found for: SPEP, UPEP  Lab Results  Component Value Date   WBC 7.3 09/19/2016   NEUTROABS 6.4 09/19/2016   HGB 12.2 (L) 09/19/2016   HCT 38.9 09/19/2016   MCV 84.6 09/19/2016   PLT 182 09/19/2016      Chemistry      Component Value Date/Time   NA 138 09/19/2016 1323   K 4.2 09/19/2016 1323   CL 105 04/14/2016 0022   CO2 26 09/19/2016 1323   BUN 30.9 (H) 09/19/2016 1323   CREATININE 1.2 09/19/2016 1323      Component Value Date/Time   CALCIUM 9.9 09/19/2016 1323   ALKPHOS 135 09/19/2016 1323   AST 15 09/19/2016 1323   ALT 16 09/19/2016 1323   BILITOT 0.34 09/19/2016 1323     ASSESSMENT & PLAN:  Cancer of base of tongue ([place]) He has signs of disease progression on the left side of his neck. Without further systemic treatment, his prognosis is very poor and longevity is probably can be measured in less than 3 months. He has an appointment to meet with radiation oncologist next week to discuss for potential further palliative radiation treatment. I would defer to her radiation oncologist to discuss with the patient the benefit of further radiation therapy  Metastasis to supraclavicular lymph node [facility]) Examination revealed diffuse lymphedema around his neck and lymphadenopathy around his neck I took pictures I suspect he may have disease progression I would defer palliative radiation therapy discussion to radiation oncologist. In the meantime, I recommend he continues taking dexamethasone to help reduce the swelling and that in turn might help with pain  Cancer associated pain His pain remained poorly controlled especially in bony areas and over his left neck He will  continue his current pain medication regimen with methadone, Dilaudid and dexamethasone  Goals of care,  counseling/discussion The patient is aware he has stage IV disease and treatment is strictly palliative. We discussed importance of Advanced Directives and Living will. We also discussed potential referral for home base palliative care and he agrees for the referral Per advanced directives, the patient's code status is DO NOT RESUSCITATE However, after much discussion with him, he actually wants to change his advanced directives to FULL CODE Both his daughters: Amy and Caryl Pina are his dedicated MPOA He felt that if his situation does not improved after being placed in life sustaining measures, he will allow discontinuation of these measures after a period of 7-10 days He declined placement of feeding tube or kidney dialysis   No orders of the defined types were placed in this encounter.  All questions were answered. The patient knows to call the clinic with any problems, questions or concerns. No barriers to learning was detected. I spent 25 minutes counseling the patient face to face. The total time spent in the appointment was 40 minutes and more than 50% was on counseling and review of test results     [name], MD 09/19/2016 2:28 PM

## 2016-09-19 NOTE — Assessment & Plan Note (Signed)
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 We discussed importance of Advanced Directives and Living will. We also discussed potential referral for home base palliative care and he agrees for the referral Per advanced directives, the patient's code status is DO NOT RESUSCITATE However, after much discussion with him, he actually wants to change his advanced directives to FULL CODE Both his daughters: Amy and Caryl Pina are his dedicated MPOA He felt that if his situation does not improved after being placed in life sustaining measures, he will allow discontinuation of these measures after a period of 7-10 days He declined placement of feeding tube or kidney dialysis

## 2016-09-23 ENCOUNTER — Telehealth: Payer: Self-pay | Admitting: *Deleted

## 2016-09-23 ENCOUNTER — Encounter: Payer: Self-pay | Admitting: Physical Therapy

## 2016-09-23 NOTE — Telephone Encounter (Signed)
VM from Hospice RN, Lucianne Lei, cell phone [phone].  She understands pt has appt tomorrow with Radiation Oncologist.  She will f/u w/ pt on Thursday to find out if he is going to have any Radiation Treatments.  [facility] will pay for Radiation treatments if they are recommended.

## 2016-09-23 NOTE — Telephone Encounter (Signed)
VM from Shane Cantu at [facility] states pt has been admitted to their [place].

## 2016-09-24 ENCOUNTER — Ambulatory Visit
Admission: RE | Admit: 2016-09-24 | Discharge: 2016-09-24 | Disposition: A | Discharge status: Home / Self Care | Payer: PPO | Source: Ambulatory Visit | Attending: Radiation Oncology | Admitting: Radiation Oncology

## 2016-09-24 ENCOUNTER — Encounter: Payer: Self-pay | Admitting: *Deleted

## 2016-09-24 ENCOUNTER — Telehealth: Payer: Self-pay | Admitting: *Deleted

## 2016-09-24 ENCOUNTER — Encounter: Payer: Self-pay | Admitting: Radiation Oncology

## 2016-09-24 ENCOUNTER — Ambulatory Visit: Payer: PPO

## 2016-09-24 VITALS — BP 113/71 | HR 64 | Temp 97.9°F | Ht 72.0 in | Wt 207.6 lb

## 2016-09-24 DIAGNOSIS — C770 Secondary and unspecified malignant neoplasm of lymph nodes of head, face and neck: Secondary | ICD-10-CM | POA: Insufficient documentation

## 2016-09-24 DIAGNOSIS — C01 Malignant neoplasm of base of tongue: Secondary | ICD-10-CM

## 2016-09-24 DIAGNOSIS — Z510 Encounter for antineoplastic radiation therapy: Secondary | ICD-10-CM | POA: Insufficient documentation

## 2016-09-24 NOTE — Telephone Encounter (Signed)
Will do, Cameo.  I will call Bambi re billing procedures and communicate information to RadOnc Billing.

## 2016-09-24 NOTE — Telephone Encounter (Signed)
CALLED PATIENT TO INFORM OF APPT. FOR 11/24/16- ARRIVAL TIME - 7:20 AM, SPOKE WITH PATIENT'S DAUGHTER- ASHLEY AND SHE IS AWARE OF THIS APPT.

## 2016-09-24 NOTE — Telephone Encounter (Signed)
Oncology Nurse Navigator Documentation  Per Dr. Pearlie Oyster guidance, called [facility] to arrange appt tomorrow with Dr. Constance Holster.  Spoke with [place], she voiced understanding to arrange.  Spoke with Mr. Corales who agreed to appointment, spoke also with his dtr [place]. They understand they will be contacted.  Gayleen Orem, RN, BSN, [place] at [place] [phone]

## 2016-09-24 NOTE — Progress Notes (Addendum)
Radiation Oncology         [phone] ________________________________  Name: Shane Cantu MRN: [id number]  Date: 09/24/2016  DOB: 02/15/1956  Follow-Up Visit Note  Outpatient  CC: [name], MD  [name], MD  Diagnosis and Prior Radiotherapy:    ICD-9-CM ICD-10-CM   1. 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  He reports pain in his left ear and left side of his head. He rates this a 4/10. He is taking 20 mg of methadone every 8 hours, and 8 mg of Dilaudid every 5 hours for pain relief. Patient notes pain around the nodularity on left medial clavicle. He also reports preauricular  Tenderness/swelling in the left ear. He notes his bone pain is significantly better.  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    Main sites of pain - left inner/middle ear (chronic x months ) and left preauricular region and left upper chest (skin nodule, new).                             Using a feeding tube?: No Weight changes, if any:     Wt Readings from Last 3 Encounters:  09/24/16 207 lb 9.6 oz (94.2 kg)  09/19/16 207 lb 4.8 oz (94 kg)  08/29/16 (P) 202 lb 11.2 oz (91.9 kg)   Swallowing issues, if any: He feels like food gets stuck in his throat at times, but will wash down. He states  there is a place in his throat that feels like food can't get past at times.  Smoking or chewing tobacco? No Using fluoride trays daily? No Last ENT visit was on: Not recently   ALLERGIES:  is allergic to morphine and related.  Meds: Current Outpatient Prescriptions  Medication Sig Dispense Refill  . amLODipine (NORVASC) 10 MG tablet Take 1 tablet (10 mg total) by mouth daily. 60 tablet 1  . aspirin EC 81 MG tablet Take 81 mg by mouth daily.    . celecoxib (CELEBREX) 200 MG capsule Take 200 mg by mouth daily.     . fluticasone (FLONASE) 50 MCG/ACT nasal spray Place 2 sprays into both nostrils daily. (Patient taking differently: Place 2 sprays into both nostrils 2 (two) times daily as needed for allergies. ) 16 g 0  . HYDROmorphone (DILAUDID) 8 MG tablet Take 1 tablet (8 mg total) by mouth every 6 (six) hours as needed for severe pain. 90 tablet 0  . ibuprofen (ADVIL,MOTRIN) 200 MG tablet Take by mouth.    . lansoprazole (PREVACID) 30 MG capsule Take 1 capsule (30 mg total) by mouth 2 (two) times daily before a meal. 60 capsule 9  . lidocaine (XYLOCAINE) 2 % solution Patient: Mix  1part 2% viscous lidocaine, 1part H20. Swallow 86mL of this mixture, 37min before meals and at bedtime, up to QID 100 mL 5  . Magnesium Oxide 400 MG CAPS Take 1 capsule (400 mg total) by mouth daily. 30 capsule 0  . methadone (DOLOPHINE) 10 MG tablet Take 2 tablets (20 mg total) by mouth every 8 (eight) hours. 90 tablet 0  . polyethylene glycol powder (GLYCOLAX/MIRALAX) powder Take 17 g by mouth 2 (two) times daily. (Patient taking differently: Take 17 g by mouth 2 (two) times daily as needed for mild constipation. ) 255 g 0  . promethazine (PHENERGAN) 25 MG tablet Take 1 tablet (25 mg total) by mouth every 6 (six) hours as needed for nausea. 60 tablet 3  . dexamethasone (DECADRON) 4 MG tablet Take 8mg  (2 x 4mg  tablets) by mouth twice daily for 1 day prior to (none the day of) and 1 day following chemotherapy. Repeat  every 21 Days.    . prochlorperazine (COMPAZINE) 10 MG tablet Take by mouth.     No current facility-administered medications for this encounter.     Physical Findings: The patient is in no acute distress. Patient is alert and oriented.  height is 6' (1.829 m) and weight is 207 lb 9.6 oz (94.2 kg). His temperature is 97.9 F (36.6 C). His blood pressure is 113/71 and his pulse is 64. His oxygen saturation is 97%.  General: Alert and oriented, in no acute distress HEENT: Head is normocephalic. Extraocular movements are intact. Oropharynx is clear. Mucous membranes are dry. Dentures were removed for the exam. There is preauricular firmness/swelling. Left lower cheek/jaw he has some firmness. Neck: Neck is supple. 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 Nodularity over the left medial clavicle with some a purplish hue to the area surrounding the nodule. Heart: Regular in rate and rhythm with no murmurs, rubs, or gallops. Chest: Clear to auscultation bilaterally, with no rhonchi, wheezes, or rales. Abdomen: Soft, nontender, nondistended, with no rigidity or guarding. Extremities: No cyanosis or edema. Lymphatics: see Neck Exam Skin:as above, neck exam. Musculoskeletal: symmetric strength and muscle tone throughout. Neurologic: Cranial nerves II through XII are grossly intact. No obvious focalities. Speech is fluent. Coordination is intact. Psychiatric: Judgment and insight are intact. Affect is appropriate.  Lab Findings: Lab Results  Component Value Date   WBC 7.3 09/19/2016   HGB 12.2 (L) 09/19/2016   HCT 38.9 09/19/2016   MCV 84.6 09/19/2016   PLT 182 09/19/2016   CMP     Component Value Date/Time   NA 138 09/19/2016 1323   K 4.2 09/19/2016 1323   CL 105 04/14/2016 0022   CO2 26 09/19/2016 1323   GLUCOSE 95 09/19/2016 1323   BUN 30.9 (H) 09/19/2016 1323   CREATININE 1.2 09/19/2016 1323   CALCIUM 9.9 09/19/2016 1323   PROT  7.1 09/19/2016 1323   ALBUMIN 3.0 (L) 09/19/2016 1323   AST 15 09/19/2016 1323   ALT 16 09/19/2016 1323   ALKPHOS 135 09/19/2016 1323   BILITOT 0.34 09/19/2016 1323   GFRNONAA 55 (L) 04/14/2016 0022   GFRAA >60 04/14/2016 0022    Radiographic Findings: No results found.  Impression/Plan:   He could benefit from the "quad shot" abbreviated regiment of palliative re-irradiation for his pain in the head and neck region.  Would focus on the base of tongue, left preauricular region, and left lower neck skin nodules. We discussed the risks, benefits, and potential side effects of this.  Consent form was signed today. Will proceed with radiation planning today. The goal is palliation of pain in left ear and subcutaneous tissues of left lower neck.  Will also arrange appt w/ ENT to see if there is anything else contributing to ear pain, in case they can help w/ palliation.  At least 25 min spent face to face w/ patient, over 50% on coordination of care and counseling.  Eppie Gibson, MD This document serves as a record of services personally performed by Eppie Gibson, MD. It was created on her behalf by Bethann Humble, a trained medical scribe. The creation of this record is based on the scribe's personal observations and the provider's statements to them. This document has been checked and approved by the attending provider.

## 2016-09-24 NOTE — Progress Notes (Signed)
Oncology Nurse Navigator Documentation  Met with Shane Cantu during follow-up with Dr. Isidore Moos to discuss option for additional palliative RT.  He was accompanied by his daughters Warren Lacy and Aurora. He reported that he has enrolled with [facility].  He and his dtrs are please with services/support provided thus far. He agreed to move forward with RT plan presented, proceeded to Radiation Waiting to await CT SIM. They understand I can be contacted with needs/concerns.  Gayleen Orem, RN, BSN, [place] at [place] [phone]

## 2016-09-25 ENCOUNTER — Encounter: Payer: Self-pay | Admitting: Physical Therapy

## 2016-09-25 NOTE — Progress Notes (Signed)
Head and Neck Cancer Simulation, IMRT treatment planning, and Special treatment procedure note   Outpatient  Diagnosis:    ICD-9-CM ICD-10-CM   1. 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 An Aquaplast head and shoulder mask was custom fitted to the patient's anatomy. High-resolution CT axial imaging was obtained of the head and neck with contrast. I verified that the quality of the imaging is good for treatment planning. 1 Medically Necessary Treatment Device was fabricated and supervised by me: Aquaplast mask.   Treatment planning note I plan to treat the patient with IMRT. I plan to treat the patient's symptomatic tumors sites in the base of tongue, left preauricular mass, and left upper chest nodularity with the QUAD shot regimen: plan to treat to a total dose of 29.6 Gray in 8  Fractions. Treatments with be BID, two days in a row for fractions 1-4, then repeat for fractions 5-8, if indicated, in 3 weeks. Dose calculation was ordered from dosimetry.  IMRT planning Note  IMRT is an important modality to deliver adequate dose to the patient's at risk tissues while sparing the patient's normal structures, including the: esophagus, parotid tissue, mandible, brain stem, spinal cord, oral cavity, brachial plexus.  This justifies the use of IMRT in the patient's treatment.   Special Treatment Procedure Note: The patient received prior radiotherapy close to his current fields. There could be some overlap of radiation dose.  Prior regional radiotherapy increases the risk of side effects from treatment. I have considered this in the treatment planning process and have aimed to minimize tissue overlap.  This increases the complexity of this patient's treatment and therefore this constitutes a special treatment procedure.  -----------------------------------  Eppie Gibson, MD

## 2016-09-26 ENCOUNTER — Telehealth: Payer: Self-pay | Admitting: *Deleted

## 2016-09-26 MED ORDER — METHADONE HCL 10 MG PO TABS: 30.0000 mg | ORAL_TABLET | Freq: Three times a day (TID) | ORAL | 0 refills | Status: AC

## 2016-09-26 MED ORDER — HYDROMORPHONE HCL 8 MG PO TABS: 8.0000 mg | ORAL_TABLET | ORAL | 0 refills | Status: AC | PRN

## 2016-09-26 NOTE — Telephone Encounter (Signed)
Hospice RN left message states pt needs refill on Methadone.  I called pt to clarify and confirm dose of Methadone and when he needs refill?  Pt reports taking Methadone 10 mg, 2 tablets TID, and he has 29 pills left.   He says he is running out of Dilaudid 8 mg and needs refill today.  He says [name] told him he can take 2 tablets every 6 hrs.  He only has 15 tablets left so does not have enough to last the weekend.   Since he is a Hospice pt we can Fax the Rx to his pharmacy.  Informed pt I will see if I can get refills signed/ faxed today.  I will call him back.

## 2016-09-26 NOTE — Telephone Encounter (Signed)
Wrong provider

## 2016-09-26 NOTE — Telephone Encounter (Signed)
Hospice nurse called stating that patient needs a refill for methadone faxed to pharmacy.

## 2016-09-26 NOTE — Telephone Encounter (Signed)
[name] increased Methadone to 30 mg TID and Dilaudid 8 mg, one tablet every 4 hrs prn.  Faxed Rx to [facility].  Notified pt of increase in  Methadone to take three tablets TID.  Also instructed to take only one tablet Dilaudid but can take every 4 hrs instead of every 6 hrs.  If this does not control his pain please let Hospice RN know.  Hospice RN will contact us for new orders if needed.   Pt verbalized understanding.  I called [facility] and confirmed they received Rx.  Called Hospice RN, Andee Poles and notified her or Rx changes.

## 2016-09-26 NOTE — Telephone Encounter (Signed)
Hospice nurse called stating that patient needs a refill for methadone faxed to pharmacy

## 2016-09-29 DIAGNOSIS — Z510 Encounter for antineoplastic radiation therapy: Secondary | ICD-10-CM | POA: Diagnosis not present

## 2016-09-30 ENCOUNTER — Encounter: Payer: Self-pay | Admitting: Physical Therapy

## 2016-10-02 ENCOUNTER — Encounter: Payer: Self-pay | Admitting: Physical Therapy

## 2016-10-06 ENCOUNTER — Ambulatory Visit
Admission: RE | Admit: 2016-10-06 | Discharge: 2016-10-06 | Disposition: A | Discharge status: Home / Self Care | Payer: PPO | Source: Ambulatory Visit | Attending: Radiation Oncology | Admitting: Radiation Oncology

## 2016-10-06 ENCOUNTER — Encounter: Payer: Self-pay | Admitting: Radiation Oncology

## 2016-10-06 VITALS — Temp 98.4°F | Wt 199.6 lb

## 2016-10-06 DIAGNOSIS — C01 Malignant neoplasm of base of tongue: Secondary | ICD-10-CM

## 2016-10-06 DIAGNOSIS — Z510 Encounter for antineoplastic radiation therapy: Secondary | ICD-10-CM | POA: Diagnosis not present

## 2016-10-06 NOTE — Progress Notes (Signed)
   Weekly Management Note:  Outpatient    ICD-9-CM ICD-10-CM   1. 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  CBCT/MVCT images/Port film x-rays were reviewed.  The chart was checked. Left preauricular facial pain and throat pain stable.  Left ear pain - no specific etiology identified at ENT visit.  Drinking egg nog and water. Has lidocaine and uses it prn.  Physical Findings:  Wt Readings from Last 3 Encounters:  10/06/16 199 lb 9.6 oz (90.5 kg)  09/24/16 207 lb 9.6 oz (94.2 kg)  09/19/16 207 lb 4.8 oz (94 kg)    weight is 199 lb 9.6 oz (90.5 kg). His temperature is 98.4 F (36.9 C). His oxygen saturation is 97%.  NAD, nodularity of left upper chest.  Facial swelling.   CBC    Component Value Date/Time   WBC 7.3 09/19/2016 1323   WBC 9.2 04/14/2016 0022   RBC 4.60 09/19/2016 1323   RBC 4.66 04/14/2016 0022   HGB 12.2 (L) 09/19/2016 1323   HCT 38.9 09/19/2016 1323   PLT 182 09/19/2016 1323   MCV 84.6 09/19/2016 1323   MCH 26.6 (L) 09/19/2016 1323   MCH 28.1 04/14/2016 0022   MCHC 31.4 (L) 09/19/2016 1323   MCHC 33.0 04/14/2016 0022   RDW 17.9 (H) 09/19/2016 1323   LYMPHSABS 0.4 (L) 09/19/2016 1323   MONOABS 0.5 09/19/2016 1323   EOSABS 0.1 09/19/2016 1323   BASOSABS 0.0 09/19/2016 1323     CMP     Component Value Date/Time   NA 138 09/19/2016 1323   K 4.2 09/19/2016 1323   CL 105 04/14/2016 0022   CO2 26 09/19/2016 1323   GLUCOSE 95 09/19/2016 1323   BUN 30.9 (H) 09/19/2016 1323   CREATININE 1.2 09/19/2016 1323   CALCIUM 9.9 09/19/2016 1323   PROT 7.1 09/19/2016 1323   ALBUMIN 3.0 (L) 09/19/2016 1323   AST 15 09/19/2016 1323   ALT 16 09/19/2016 1323   ALKPHOS 135 09/19/2016 1323   BILITOT 0.34 09/19/2016 1323   GFRNONAA 55 (L) 04/14/2016 0022   GFRAA >60 04/14/2016 0022     Impression:  The patient is tolerating radiotherapy.    Plan:  Continue radiotherapy as planned.  F/u in 3wks after 4th fraction to reassess for 4 more fractions per QUAD SHOT method.  -----------------------------------  Eppie Gibson, MD

## 2016-10-06 NOTE — Addendum Note (Signed)
Encounter addended by: Eppie Gibson, MD on: 10/06/2016  9:03 AM<BR>    Actions taken: LOS modified, Follow-up modified, Sign clinical note

## 2016-10-06 NOTE — Progress Notes (Signed)
IMRT Device Note    ICD-9-CM ICD-10-CM   1. 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 The code is [id number].  He tolerated his first fraction well.  -----------------------------------  Eppie Gibson, MD

## 2016-10-06 NOTE — Progress Notes (Addendum)
Mr. Sien presents for his 1st fraction of radiation to his Head and Neck. He reports pain a 5/10 in his neck. He also reports a sore throat and pain when swallowing. He is using prn dilaudid and methadone for pain relief. He reports having trouble with his mask this morning. He feels like his face is swollen and that may be what caused the pain. He is eating ok per his report, but is not tasting food well which has caused him to eat less. He is drinking egg nog, but not nutritional supplements. He is drinking 4-5 sixteen ounce bottles of water daily. His mouth is dry, with some white patches visible on his tongue. He is using salt rinses during the day.  BP (!) 121/100   Pulse (!) 107   Temp 98.4 F (36.9 C)   Wt 199 lb 9.6 oz (90.5 kg)   SpO2 97% Comment: room air  BMI 27.07 kg/m    Orthostatics: BP sitting 121/100 pulse 107. BP standing 122/84 pulse 90  Wt Readings from Last 3 Encounters:  10/06/16 199 lb 9.6 oz (90.5 kg)  09/24/16 207 lb 9.6 oz (94.2 kg)  09/19/16 207 lb 4.8 oz (94 kg)

## 2016-10-07 ENCOUNTER — Ambulatory Visit (HOSPITAL_COMMUNITY): Payer: Self-pay | Admitting: Dentistry

## 2016-10-07 ENCOUNTER — Encounter (HOSPITAL_COMMUNITY): Payer: Self-pay | Admitting: Dentistry

## 2016-10-07 ENCOUNTER — Ambulatory Visit
Admission: RE | Admit: 2016-10-07 | Discharge: 2016-10-07 | Disposition: A | Discharge status: Home / Self Care | Payer: PPO | Source: Ambulatory Visit | Attending: Radiation Oncology | Admitting: Radiation Oncology

## 2016-10-07 VITALS — BP 131/89 | HR 108 | Temp 97.8°F

## 2016-10-07 DIAGNOSIS — Z5189 Encounter for other specified aftercare: Secondary | ICD-10-CM | POA: Diagnosis not present

## 2016-10-07 DIAGNOSIS — Z510 Encounter for antineoplastic radiation therapy: Secondary | ICD-10-CM | POA: Diagnosis not present

## 2016-10-07 DIAGNOSIS — R682 Dry mouth, unspecified: Secondary | ICD-10-CM

## 2016-10-07 DIAGNOSIS — Z463 Encounter for fitting and adjustment of dental prosthetic device: Secondary | ICD-10-CM

## 2016-10-07 DIAGNOSIS — K0820 Unspecified atrophy of edentulous alveolar ridge: Secondary | ICD-10-CM

## 2016-10-07 DIAGNOSIS — C01 Malignant neoplasm of base of tongue: Secondary | ICD-10-CM | POA: Diagnosis not present

## 2016-10-07 DIAGNOSIS — C7951 Secondary malignant neoplasm of bone: Secondary | ICD-10-CM

## 2016-10-07 DIAGNOSIS — K117 Disturbances of salivary secretion: Secondary | ICD-10-CM

## 2016-10-07 DIAGNOSIS — K08109 Complete loss of teeth, unspecified cause, unspecified class: Secondary | ICD-10-CM

## 2016-10-07 DIAGNOSIS — Z923 Personal history of irradiation: Secondary | ICD-10-CM

## 2016-10-07 NOTE — Progress Notes (Signed)
10/07/2016  Patient Name:   Shane Cantu Date of Birth:   [birth year] Medical Record Number: [id number]  BP 131/89 (BP Location: Left Arm)   Pulse (!) 108   Temp 97.8 F (36.6 C) (Oral)   Shane Cantu is a 60 yo male that presents for periodic oral exam and evaluation of upper and lower complete dentures. The patient was initially evaluated in May of 2016 as part of a prechemoradiation therapy dental protocol examination for his base of tongue cancer. Patient then underwent extraction of remaining teeth with alveoloplasty on 04/26/2015. Patient then had chemoradiation therapy from 05/10/2015 through 05/23/2015. Upper and lower dentures were then fabricated and inserted on 08/21/2015. Patient was seen for multiple denture adjustment appointments. The patient subsequently has been diagnosed with metastatic cancer to bone and then additional metastasis to supraclavicular node. Patient currently being treated with palliative radiation therapy to the left neck, left ear, and left supraclavicular areas with Dr. Isidore Moos. Patient is now complaining of some lower left denture irritation.   Medical Hx Update:  Past Medical History:  Diagnosis Date  . Allergic rhinitis   . Arthritis   . Back pain   . Bacterial conjunctivitis of left eye 07/26/2015  . Cancer ([place])     may 2016 tongue cancer  . Chronic fatigue 01/11/2016  . Chronic neck pain   . Chronic pain   . GERD (gastroesophageal reflux disease)   . History of kidney stones   . Hypertension   . Migraine headache without aura   . Neuropathy ([place])   . Pharyngitis, acute 09/06/2015  . Radiation 05/11/15-05/23/15   T1-T3 and Left 3rd posterior rib, Base of tongue, neck and Cspine  . Radiation 04/14/16-04/25/16   right anterior 5th rib, left posterior neck  . Sleep apnea    does not use cpap  .  Past Surgical History:  Procedure Laterality Date  . BACK SURGERY  03/1993, 10/2002   Dr. Louanne Skye  . COLONOSCOPY    . LYMPH NODE BIOPSY    . MULTIPLE  EXTRACTIONS WITH ALVEOLOPLASTY N/A 04/26/2015   Procedure: Extraction of tooth #'s 6,11,12,15,20,21,22,27,28 with alveoloplasty;  Surgeon: [name], DDS;  Location: [facility];  Service: Oral Surgery;  Laterality: N/A;  . Nasal sinusotomy  1977  . NECK SURGERY  2004   Dr. Louanne Skye  . RADIOLOGY WITH ANESTHESIA N/A 08/09/2015   Procedure: MRI LUMBAR WITH/WITHOUT CONTRAST, CERVICAL WITH CONTRAST  (RADIOLOGY WITH ANESTHESIA);  Surgeon: Medication Radiologist, MD;  Location: [facility];  Service: Radiology;  Laterality: N/A;  . TONSILLECTOMY     as a child    ALLERGIES/ADVERSE DRUG REACTIONS: Allergies  Allergen Reactions  . Morphine And Related Anaphylaxis    MEDICATIONS: Current Outpatient Prescriptions  Medication Sig Dispense Refill  . amLODipine (NORVASC) 10 MG tablet Take 1 tablet (10 mg total) by mouth daily. 60 tablet 1  . aspirin EC 81 MG tablet Take 81 mg by mouth daily.    . celecoxib (CELEBREX) 200 MG capsule Take 200 mg by mouth daily.     Marland Kitchen dexamethasone (DECADRON) 4 MG tablet Take 8mg  (2 x 4mg  tablets) by mouth twice daily for 1 day prior to (none the day of) and 1 day following chemotherapy. Repeat every 21 Days.    . fluticasone (FLONASE) 50 MCG/ACT nasal spray Place 2 sprays into both nostrils daily. (Patient taking differently: Place 2 sprays into both nostrils 2 (two) times daily as needed for allergies. ) 16 g 0  . HYDROmorphone (DILAUDID)  8 MG tablet Take 1 tablet (8 mg total) by mouth every 4 (four) hours as needed for severe pain. 90 tablet 0  . ibuprofen (ADVIL,MOTRIN) 200 MG tablet Take by mouth.    . lansoprazole (PREVACID) 30 MG capsule Take 1 capsule (30 mg total) by mouth 2 (two) times daily before a meal. 60 capsule 9  . lidocaine (XYLOCAINE) 2 % solution Patient: Mix 1part 2% viscous lidocaine, 1part H20. Swallow 60mL of this mixture, 63min before meals and at bedtime, up to QID 100 mL 5  . Magnesium Oxide 400 MG CAPS Take 1 capsule (400 mg total) by mouth daily. 30  capsule 0  . methadone (DOLOPHINE) 10 MG tablet Take 3 tablets (30 mg total) by mouth every 8 (eight) hours. 90 tablet 0  . polyethylene glycol powder (GLYCOLAX/MIRALAX) powder Take 17 g by mouth 2 (two) times daily. (Patient taking differently: Take 17 g by mouth 2 (two) times daily as needed for mild constipation. ) 255 g 0  . prochlorperazine (COMPAZINE) 10 MG tablet Take by mouth.    . promethazine (PHENERGAN) 25 MG tablet Take 1 tablet (25 mg total) by mouth every 6 (six) hours as needed for nausea. 60 tablet 3   No current facility-administered medications for this visit.     C/C: Lower left denture irritation.  HPI:  Shane Cantu is a 59 yo male that presents for periodic oral exam and evaluation of upper and lower complete dentures. The patient was initially evaluated in May of 2016 as part of a prechemoradiation therapy dental protocol examination for his base of tongue cancer. Patient then underwent extraction of remaining teeth with alveoloplasty on 04/26/2015. Patient then had chemoradiation therapy from 05/10/2015 through 05/23/2015. Upper and lower dentures were then fabricated and inserted on 08/21/2015. Patient was seen for multiple denture adjustment appointments. The patient subsequently has been diagnosed with metastatic cancer to bone and then additional metastasis to supraclavicular node. Patient currently being treated with palliative radiation therapy to the left neck, left ear, and left supraclavicular areas with Dr. Isidore Moos. Patient is now complaining of some lower left denture irritation.  DENTAL EXAM: General: The patient is well-developed, well-nourished male in no acute distress. Vitals: BP 131/89 (BP Location: Left Arm)   Pulse (!) 108   Temp 97.8 F (36.6 C) (Oral)  Extraoral Exam: Left neck is consistent with current palliative radiation therapy. Patient deniesTMJ Symptoms. Intraoral  Exam: Patient has xerostomia. There is no evidence of denture irritation. The  tongue is dry and fissured. Dentition: Edentulous. 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 Prosthodontic: Patient has upper and lower complete dentures. The dentures are retentive and stable. Patient indicating paste was applied to the upper and lower dentures. Dentures were adjusted as needed. Dentures were polished. Occlusion:  The occlusion was evaluated. Bilateral crossbite was noted. Patient is posturing and anterior position resulting and anterior open bite. Patient indicates that he is able to chew whatever he wants, however.  Assessments: 1. Patient is edentulous. 2. 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 3. Patient has xerostomia with a dry, fissured tongue. 4. Stable and retentive upper lower complete dentures with less than ideal occlusion.  Plan:  1. Patient is to call if problems arise. 2. Keep dentures out if sore spots arise. 3. Use salt water rinses as needed to aid healing.   [name], DDS

## 2016-10-07 NOTE — Patient Instructions (Signed)
Plan:  1. Patient is to call if problems arise. 2. Keep dentures out if sore spots arise. 3. Use salt water rinses as needed to aid healing.   [name], DDS

## 2016-10-09 ENCOUNTER — Telehealth: Payer: Self-pay | Admitting: *Deleted

## 2016-10-09 NOTE — Telephone Encounter (Signed)
VM from Hospice RN, Lucianne Lei, left at 4:07 pm.  States pt's pain is not managed on current regimen and she requests call back.  Called RN back at [phone].  She reports pt taking Methadone 30 mg TID and Dilaudid 8 mg every 4 hrs and his pain is not controlled.  He is also having anxiety.  Requested she contact Hospice MD for symptom management orders now.   [name] is out of office now but she will appreciate Hospice MD to manage pt's pain and anxiety.  RN said she will contact Hospice MD for orders.

## 2016-10-27 ENCOUNTER — Ambulatory Visit: Payer: PPO

## 2016-10-27 ENCOUNTER — Telehealth: Payer: Self-pay

## 2016-10-27 ENCOUNTER — Ambulatory Visit: Admission: RE | Admit: 2016-10-27 | Payer: PPO | Source: Ambulatory Visit | Admitting: Radiation Oncology

## 2016-10-28 ENCOUNTER — Ambulatory Visit: Payer: PPO

## 2016-10-28 ENCOUNTER — Telehealth: Payer: Self-pay | Admitting: *Deleted

## 2016-10-28 NOTE — Telephone Encounter (Signed)
Oncology Nurse Navigator Documentation  Received call from Mr. Radwan daughter, Amy, notifying that he died yesterday afternoon.  I expressed condolences on behalf of his Care Team, notified Team.  Gayleen Orem, RN, BSN, [place] at [place] [phone]

## 2016-10-29 ENCOUNTER — Ambulatory Visit: Admission: RE | Admit: 2016-10-29 | Payer: PPO | Source: Ambulatory Visit

## 2016-10-29 ENCOUNTER — Encounter: Payer: Self-pay | Admitting: Radiation Oncology

## 2016-10-30 ENCOUNTER — Ambulatory Visit: Payer: PPO

## 2016-10-31 NOTE — Telephone Encounter (Signed)
I called Shane Cantu regarding his appointments with Dr. Isidore Moos and radiation today that he is currently late for. I left a voice mail asking for a return call when he is able.

## 2016-10-31 DEATH — deceased

## 2016-11-14 NOTE — Progress Notes (Signed)
  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 Cancer of base of tongue ([place]) 141.0 C01     Indication for treatment:  Palliative       Radiation treatment dates:   10/06/2016 to 10/07/2016  Site/dose:   The head/neck was re-treated to 14.8 Gy in 4 fractions out of a planned dose of 29.6 Gy in 8 fractions at 3.7 Gy per fraction.   Beams/energy:   IMRT // 6X   Narrative: The patient tolerated his initial cycle (4 fractions, "QUADSHOT" regimen) of radiation treatment well however the patient died weeks after treatment and did not return for follow up to consider further fractions of palliative radiation for his metastatic cancer.   Plan:   The patient passed away on Oct 31, 2016. A card of condolences was sent to his family.  -----------------------------------  Eppie Gibson, MD

## 2016-11-15 ENCOUNTER — Other Ambulatory Visit: Payer: Self-pay | Admitting: Nurse Practitioner

## 2017-05-19 IMAGING — CR DG RIBS 2V*R*
4 series · 4 of 4 positions shown · non-contrast
Comparison: 06/04/2015

CLINICAL DATA: Rib pain on right side. Fall 3 times yesterday due
to lightheadedness. Pain in right posterior lower ribs.

EXAM:
RIGHT RIBS - 2 VIEW

[t ribs ap/pa upper right]
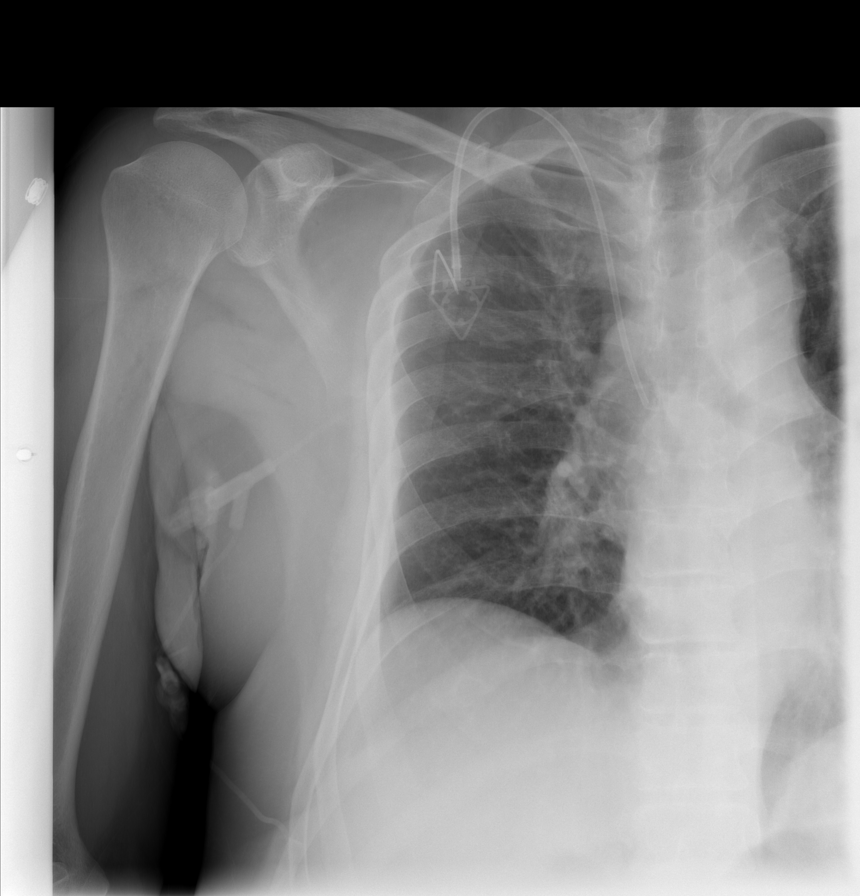

[t ribs ap/pa  lower right]
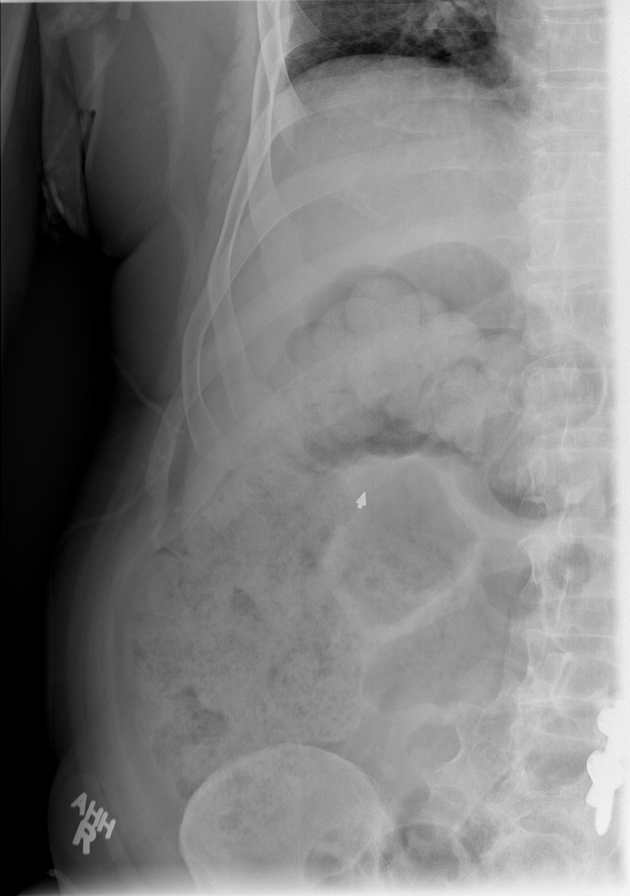

[t ribs obl. right (1 of 2)]
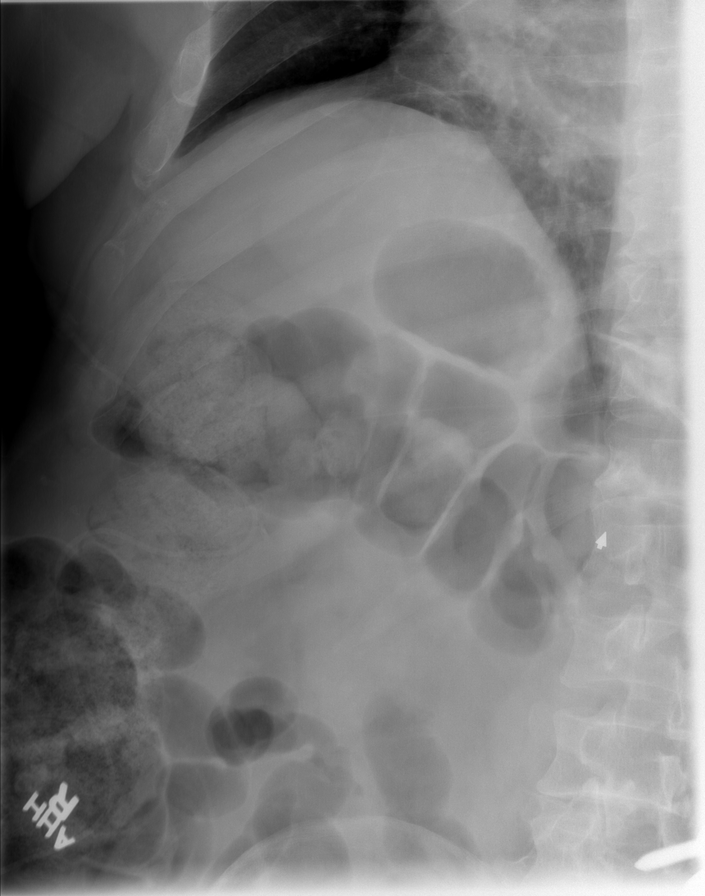

[t ribs obl. right (2 of 2)]
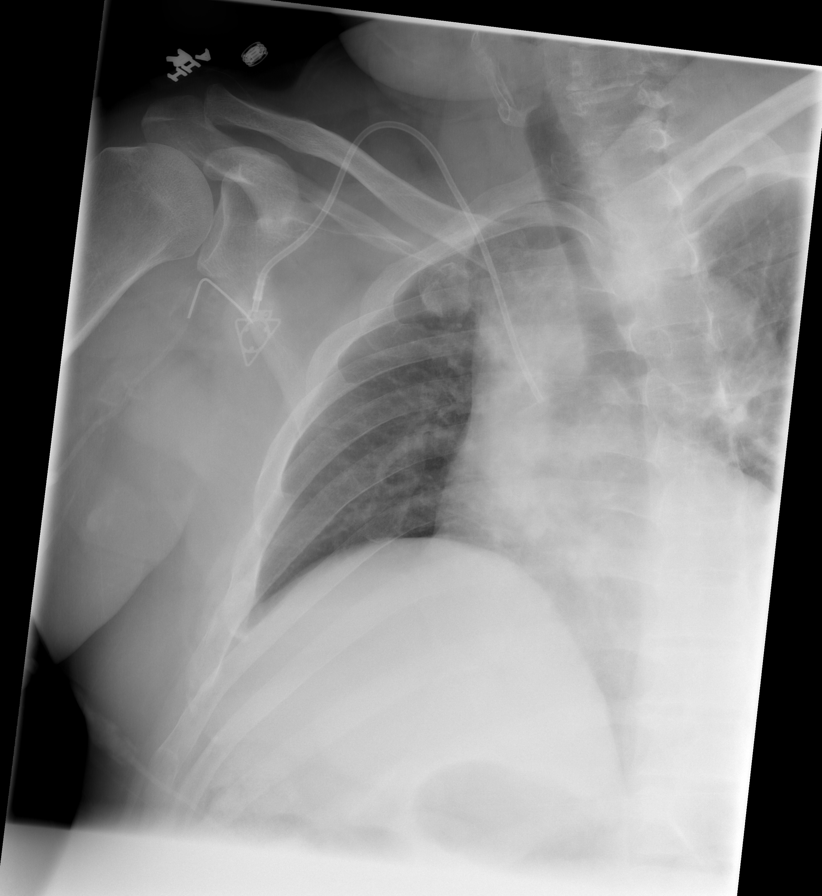

[4 of 4 positions shown; findings below may reference images not displayed]

FINDINGS: Right Port-A-Cath in place with the tip in the SVC. Right lung is
clear. No effusions or pneumothorax. No visible rib fracture.
IMPRESSION: No acute findings.

## 2017-05-19 IMAGING — CT CT HEAD W/O CM
2 series · 16 of 30 positions shown, 20 images · non-contrast
Comparison: None.

CLINICAL DATA: Syncopal episode, frontal headache. History of head
and neck cancer.

EXAM:
CT HEAD WITHOUT CONTRAST
TECHNIQUE: Contiguous axial images were obtained from the base of the skull
through the vertex without intravenous contrast.

[Series 2: head w/o · axial · non-contrast · 0.46mm/px · z∈[-199,-59]mm · 13 of 34 slices shown, 17 images]
[im 3/34  brain]
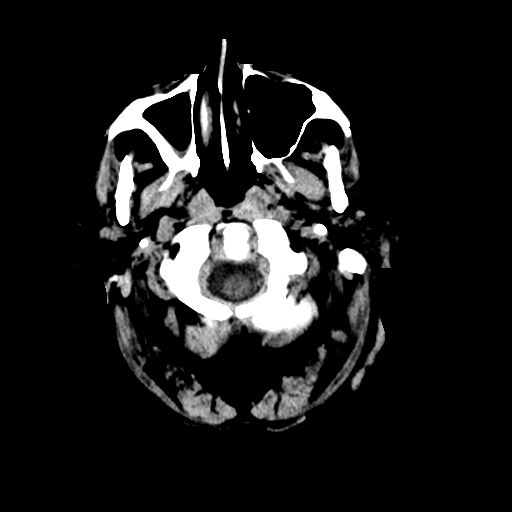
[im 3/34  bone]
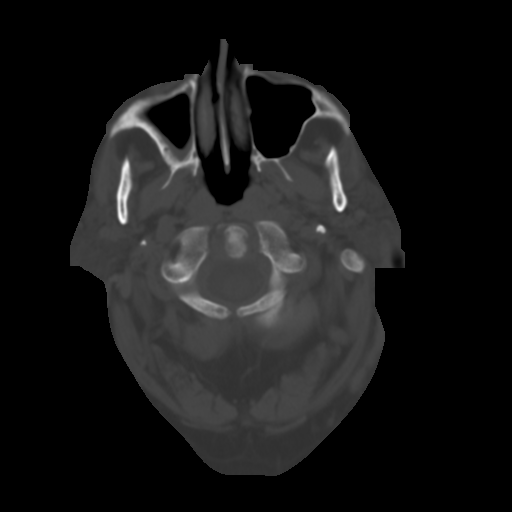
[im 5/34  brain]
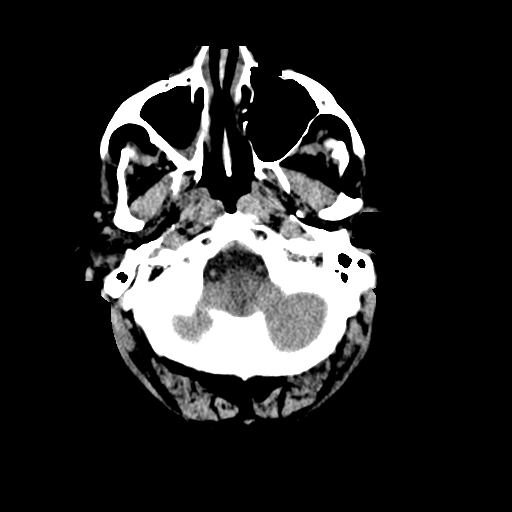
[im 8/34  brain]
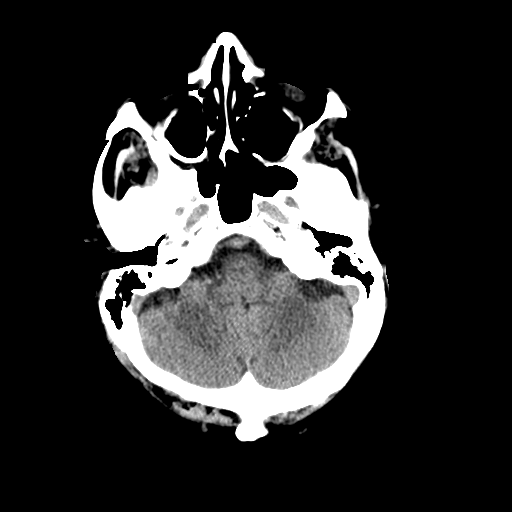
[im 10/34  brain]
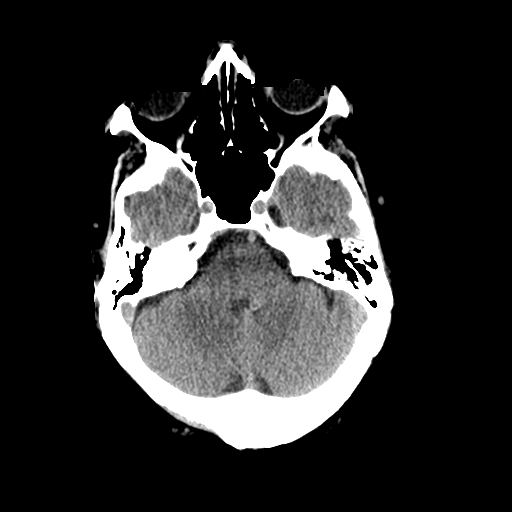
[im 12/34  brain]
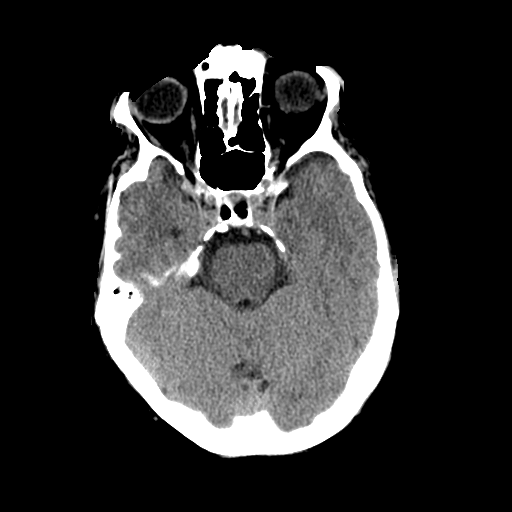
[im 12/34  bone]
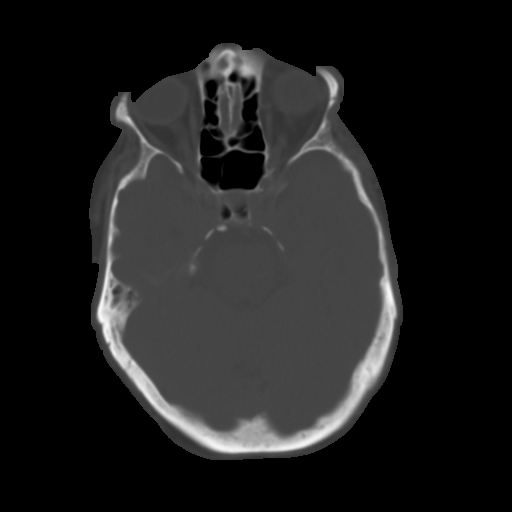
[im 15/34  brain]
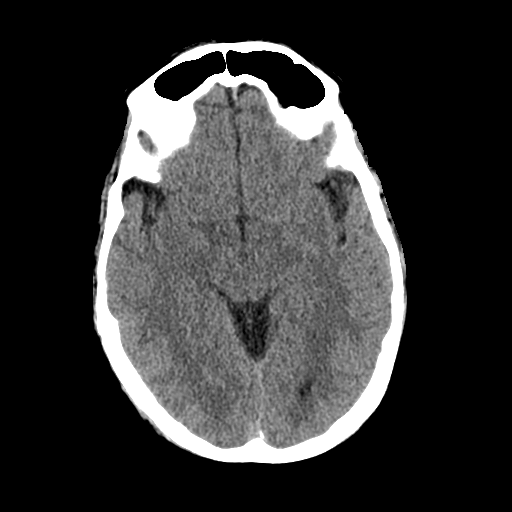
[im 17/34  brain]
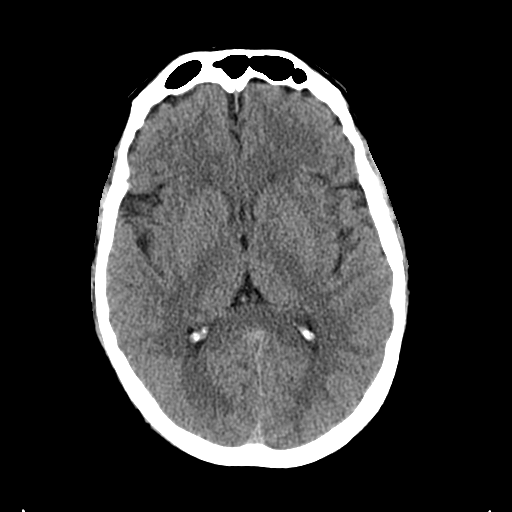
[im 19/34  brain]
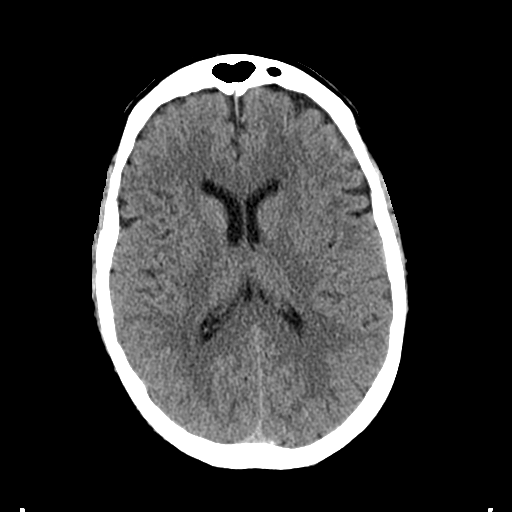
[im 22/34  brain]
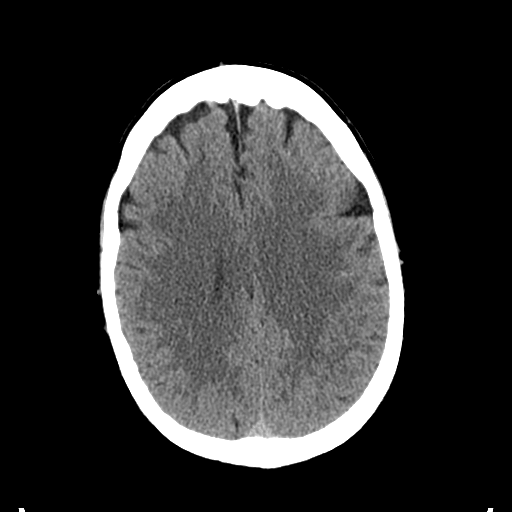
[im 22/34  bone]
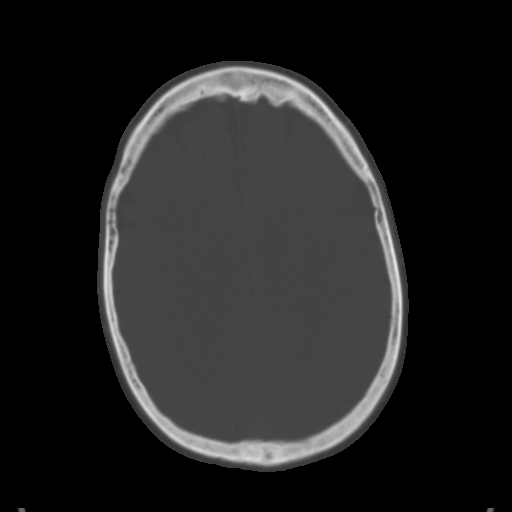
[im 24/34  brain]
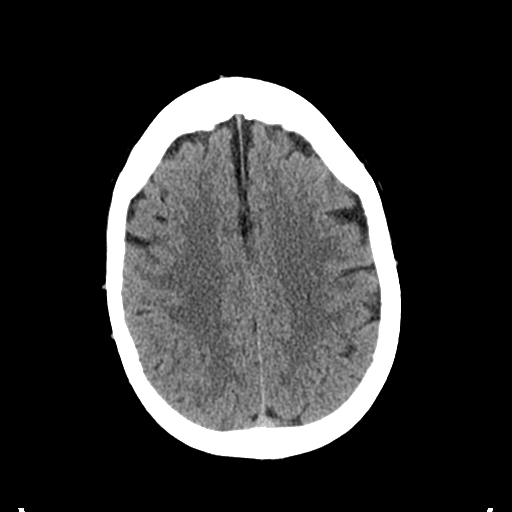
[im 26/34  brain]
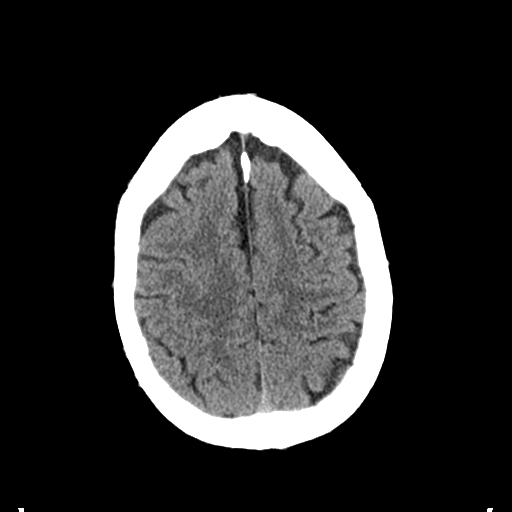
[im 29/34  brain]
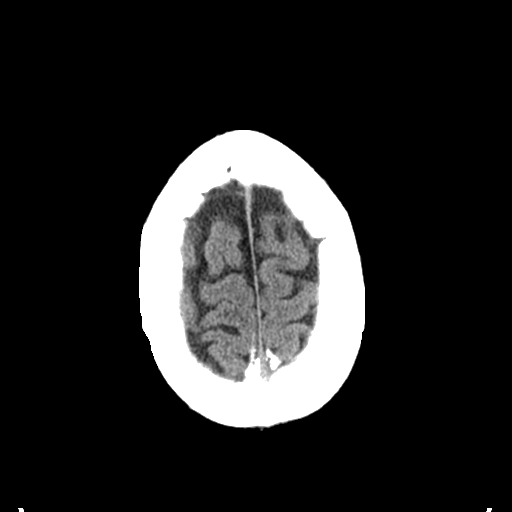
[im 31/34  brain]
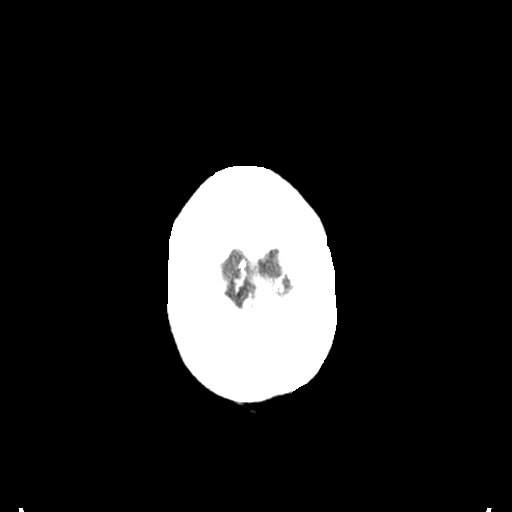
[im 31/34  bone]
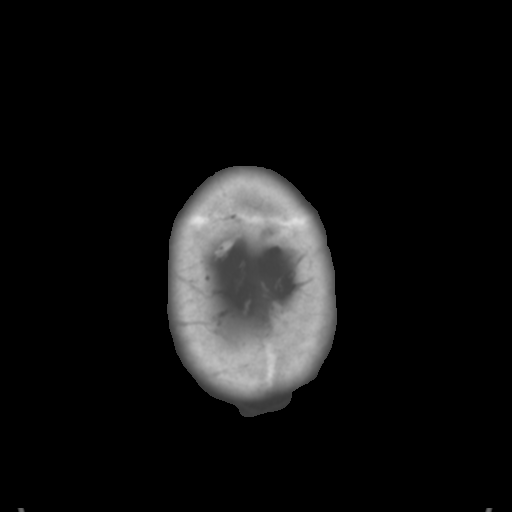

[Series 3: bone windows · axial · 0.46mm/px · z∈[-199,-154]mm · 3 of 34 slices shown]
[im 3/34  bone]
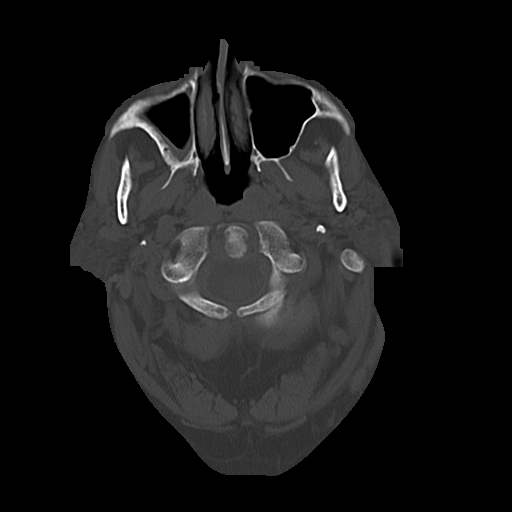
[im 8/34  bone]
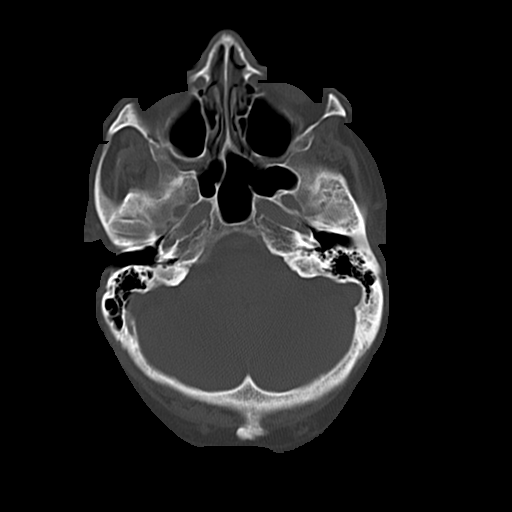
[im 12/34  bone]
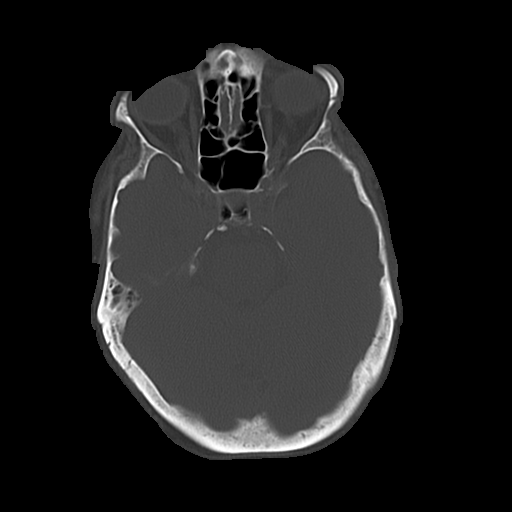

[16 of 30 positions shown; findings below may reference images not displayed]

FINDINGS: The ventricles and sulci are normal for age. No intraparenchymal
hemorrhage, mass effect nor midline shift. No acute large vascular
territory infarcts.

No abnormal extra-axial fluid collections. Basal cisterns are
patent.

No skull fracture. The included ocular globes and orbital contents
are non-suspicious. RIGHT maxillary sinus mucosal thickening with
bony wall thickening consistent with chronic sinusitis, small RIGHT
maxillary mucous retention cyst. The mastoid air cells are well
aerated.
IMPRESSION: No acute intracranial process; normal noncontrast CT head for age.
